# Patient Record
Sex: Female | Born: 1975 | Race: White | Hispanic: No | Smoking: Current every day smoker
Health system: Southern US, Community
[De-identification: ages and names within clinical notes are randomized; demographics above are authoritative.]

## PROBLEM LIST (undated history)

## (undated) DIAGNOSIS — J45909 Unspecified asthma, uncomplicated: Secondary | ICD-10-CM

## (undated) DIAGNOSIS — F419 Anxiety disorder, unspecified: Secondary | ICD-10-CM

## (undated) DIAGNOSIS — I10 Essential (primary) hypertension: Secondary | ICD-10-CM

## (undated) DIAGNOSIS — K8020 Calculus of gallbladder without cholecystitis without obstruction: Secondary | ICD-10-CM

## (undated) DIAGNOSIS — E669 Obesity, unspecified: Secondary | ICD-10-CM

## (undated) DIAGNOSIS — F319 Bipolar disorder, unspecified: Secondary | ICD-10-CM

## (undated) DIAGNOSIS — T7840XA Allergy, unspecified, initial encounter: Secondary | ICD-10-CM

## (undated) DIAGNOSIS — E079 Disorder of thyroid, unspecified: Secondary | ICD-10-CM

## (undated) DIAGNOSIS — E119 Type 2 diabetes mellitus without complications: Secondary | ICD-10-CM

## (undated) DIAGNOSIS — L409 Psoriasis, unspecified: Secondary | ICD-10-CM

## (undated) DIAGNOSIS — K219 Gastro-esophageal reflux disease without esophagitis: Secondary | ICD-10-CM

## (undated) DIAGNOSIS — E7800 Pure hypercholesterolemia, unspecified: Secondary | ICD-10-CM

## (undated) DIAGNOSIS — Z8659 Personal history of other mental and behavioral disorders: Secondary | ICD-10-CM

## (undated) DIAGNOSIS — J302 Other seasonal allergic rhinitis: Secondary | ICD-10-CM

## (undated) HISTORY — DX: Personal history of other mental and behavioral disorders: Z86.59

## (undated) HISTORY — DX: Type 2 diabetes mellitus without complications: E11.9

## (undated) HISTORY — DX: Allergy, unspecified, initial encounter: T78.40XA

## (undated) HISTORY — DX: Bipolar disorder, unspecified: F31.9

## (undated) HISTORY — DX: Calculus of gallbladder without cholecystitis without obstruction: K80.20

## (undated) HISTORY — PX: TUBAL LIGATION: SHX77

## (undated) HISTORY — DX: Other seasonal allergic rhinitis: J30.2

## (undated) HISTORY — DX: Gastro-esophageal reflux disease without esophagitis: K21.9

## (undated) HISTORY — DX: Disorder of thyroid, unspecified: E07.9

## (undated) HISTORY — DX: Obesity, unspecified: E66.9

## (undated) HISTORY — PX: TONSILLECTOMY: SUR1361

## (undated) HISTORY — DX: Unspecified asthma, uncomplicated: J45.909

## (undated) HISTORY — DX: Anxiety disorder, unspecified: F41.9

## (undated) HISTORY — DX: Pure hypercholesterolemia, unspecified: E78.00

---

## 2000-03-31 ENCOUNTER — Other Ambulatory Visit: Admission: RE | Admit: 2000-03-31 | Discharge: 2000-03-31 | Payer: Self-pay | Admitting: Internal Medicine

## 2001-04-03 ENCOUNTER — Other Ambulatory Visit: Admission: RE | Admit: 2001-04-03 | Discharge: 2001-04-03 | Payer: Self-pay | Admitting: Internal Medicine

## 2004-07-27 ENCOUNTER — Ambulatory Visit (HOSPITAL_BASED_OUTPATIENT_CLINIC_OR_DEPARTMENT_OTHER): Admission: RE | Admit: 2004-07-27 | Discharge: 2004-07-27 | Payer: Self-pay | Admitting: Internal Medicine

## 2004-08-02 ENCOUNTER — Ambulatory Visit: Payer: Self-pay | Admitting: Internal Medicine

## 2005-04-20 ENCOUNTER — Emergency Department (HOSPITAL_COMMUNITY): Admission: EM | Admit: 2005-04-20 | Discharge: 2005-04-21 | Payer: Self-pay | Admitting: Emergency Medicine

## 2005-05-07 ENCOUNTER — Emergency Department (HOSPITAL_COMMUNITY): Admission: EM | Admit: 2005-05-07 | Discharge: 2005-05-07 | Payer: Self-pay | Admitting: Emergency Medicine

## 2005-05-09 ENCOUNTER — Emergency Department (HOSPITAL_COMMUNITY): Admission: AD | Admit: 2005-05-09 | Discharge: 2005-05-09 | Payer: Self-pay | Admitting: Family Medicine

## 2005-05-12 ENCOUNTER — Emergency Department (HOSPITAL_COMMUNITY): Admission: EM | Admit: 2005-05-12 | Discharge: 2005-05-12 | Payer: Self-pay | Admitting: Emergency Medicine

## 2005-05-13 ENCOUNTER — Emergency Department (HOSPITAL_COMMUNITY): Admission: EM | Admit: 2005-05-13 | Discharge: 2005-05-13 | Payer: Self-pay | Admitting: Emergency Medicine

## 2005-05-15 ENCOUNTER — Emergency Department (HOSPITAL_COMMUNITY): Admission: EM | Admit: 2005-05-15 | Discharge: 2005-05-15 | Payer: Self-pay | Admitting: *Deleted

## 2005-06-03 ENCOUNTER — Emergency Department (HOSPITAL_COMMUNITY): Admission: EM | Admit: 2005-06-03 | Discharge: 2005-06-03 | Payer: Self-pay | Admitting: Emergency Medicine

## 2005-06-06 ENCOUNTER — Emergency Department (HOSPITAL_COMMUNITY): Admission: EM | Admit: 2005-06-06 | Discharge: 2005-06-07 | Payer: Self-pay | Admitting: Emergency Medicine

## 2011-07-31 ENCOUNTER — Encounter (HOSPITAL_COMMUNITY): Payer: Self-pay | Admitting: Emergency Medicine

## 2011-07-31 ENCOUNTER — Emergency Department (HOSPITAL_COMMUNITY)
Admission: EM | Admit: 2011-07-31 | Discharge: 2011-07-31 | Disposition: A | Discharge status: Home / Self Care | Payer: Self-pay | Attending: Emergency Medicine | Admitting: Emergency Medicine

## 2011-07-31 DIAGNOSIS — G8929 Other chronic pain: Secondary | ICD-10-CM | POA: Insufficient documentation

## 2011-07-31 DIAGNOSIS — R079 Chest pain, unspecified: Secondary | ICD-10-CM | POA: Insufficient documentation

## 2011-07-31 DIAGNOSIS — F17200 Nicotine dependence, unspecified, uncomplicated: Secondary | ICD-10-CM | POA: Insufficient documentation

## 2011-07-31 DIAGNOSIS — I10 Essential (primary) hypertension: Secondary | ICD-10-CM | POA: Insufficient documentation

## 2011-07-31 DIAGNOSIS — N898 Other specified noninflammatory disorders of vagina: Secondary | ICD-10-CM | POA: Insufficient documentation

## 2011-07-31 DIAGNOSIS — E119 Type 2 diabetes mellitus without complications: Secondary | ICD-10-CM | POA: Insufficient documentation

## 2011-07-31 DIAGNOSIS — M549 Dorsalgia, unspecified: Secondary | ICD-10-CM | POA: Insufficient documentation

## 2011-07-31 HISTORY — DX: Essential (primary) hypertension: I10

## 2011-07-31 HISTORY — DX: Psoriasis, unspecified: L40.9

## 2011-07-31 LAB — URINALYSIS, ROUTINE W REFLEX MICROSCOPIC
Glucose, UA: 100 mg/dL — AB
Ketones, ur: 15 mg/dL — AB
Nitrite: NEGATIVE
Protein, ur: 30 mg/dL — AB
Specific Gravity, Urine: 1.024 (ref 1.005–1.030)
Urobilinogen, UA: 1 mg/dL (ref 0.0–1.0)
pH: 6 (ref 5.0–8.0)

## 2011-07-31 LAB — POCT I-STAT, CHEM 8
BUN: 3 mg/dL — ABNORMAL LOW (ref 6–23)
Calcium, Ion: 1.19 mmol/L (ref 1.12–1.32)
Chloride: 106 mEq/L (ref 96–112)
Creatinine, Ser: 0.5 mg/dL (ref 0.50–1.10)
Glucose, Bld: 231 mg/dL — ABNORMAL HIGH (ref 70–99)
HCT: 45 % (ref 36.0–46.0)
Hemoglobin: 15.3 g/dL — ABNORMAL HIGH (ref 12.0–15.0)
Potassium: 3.6 mEq/L (ref 3.5–5.1)
Sodium: 142 mEq/L (ref 135–145)
TCO2: 23 mmol/L (ref 0–100)

## 2011-07-31 LAB — WET PREP, GENITAL
Clue Cells Wet Prep HPF POC: NONE SEEN
Trich, Wet Prep: NONE SEEN

## 2011-07-31 LAB — URINE MICROSCOPIC-ADD ON

## 2011-07-31 LAB — GLUCOSE, CAPILLARY: Glucose-Capillary: 239 mg/dL — ABNORMAL HIGH (ref 70–99)

## 2011-07-31 LAB — POCT PREGNANCY, URINE: Preg Test, Ur: NEGATIVE

## 2011-07-31 MED ORDER — LIDOCAINE HCL (PF) 1 % IJ SOLN
INTRAMUSCULAR | Status: AC
  Administered 2011-07-31: 2 mL
  Filled 2011-07-31: qty 5

## 2011-07-31 MED ORDER — AZITHROMYCIN 250 MG PO TABS
1000.0000 mg | ORAL_TABLET | Freq: Once | ORAL | Status: AC
  Administered 2011-07-31: 1000 mg via ORAL
  Filled 2011-07-31: qty 4

## 2011-07-31 MED ORDER — CEFTRIAXONE SODIUM 250 MG IJ SOLR
250.0000 mg | Freq: Once | INTRAMUSCULAR | Status: AC
  Administered 2011-07-31: 250 mg via INTRAMUSCULAR
  Filled 2011-07-31: qty 250

## 2011-07-31 MED ORDER — FLUCONAZOLE 50 MG PO TABS
150.0000 mg | ORAL_TABLET | Freq: Once | ORAL | Status: AC
  Administered 2011-07-31: 150 mg via ORAL
  Filled 2011-07-31: qty 3

## 2011-07-31 NOTE — ED Notes (Signed)
Patient given discharge paperwork; went over discharge instructions with patient.  Patient instructed to follow up with primary care physician/referral, and to return to the ED for new, worsening, or concerning symptoms.

## 2011-07-31 NOTE — ED Notes (Signed)
Patient taken to CDU 11 for pelvic exam.  PA and RN at bedside with pelvic cart.

## 2011-07-31 NOTE — Discharge Instructions (Signed)
You were seen and evaluated for your complaints of vaginal discharge. You're treated for possible infection. It is recommended that he not have any sexual intercourse until you have results from your testing in 2 days confirmed. If you have any positive test results please inform all of your sexual partners. Is also recommended that he followup with [facility] health Department sexual transmitted disease clinic for continued evaluation and treatment. Your urine test today was also sent for cultures to look for infection. If you develop any pain with urination, increased frequency or flank pains please return to your primary care office for further evaluation. You should return to emergency room for any fever, chills, sweats, persistent nausea vomiting.   Chronic Back Pain When back pain lasts longer than 3 months, it is called chronic back pain.This pain can be frustrating, but the cause of the pain is rarely dangerous.People with chronic back pain often go through certain periods that are more intense (flare-ups). CAUSES Chronic back pain can be caused by wear and tear (degeneration) on different structures in your back. These structures may include bones, ligaments, or discs. This degeneration may result in more pressure being placed on the nerves that travel to your legs and feet. This can lead to pain traveling from the low back down the back of the legs. When pain lasts longer than 3 months, it is not unusual for people to experience anxiety or depression. Anxiety and depression can also contribute to low back pain. TREATMENT  Establish a regular exercise plan. This is critical to improving your functional level.   Have a self-management plan for when you flare-up. Flare-ups rarely require a medical visit. Regular exercise will help reduce the intensity and frequency of your flare-ups.   Manage how you feel about your back pain and the rest of your life. Anxiety, depression, and feeling that  you cannot alter your back pain have been shown to make back pain more intense and debilitating.   Medicines should never be your only treatment. They should be used along with other treatments to help you return to a more active lifestyle.   Procedures such as injections or surgery may be helpful but are rarely necessary. You may be able to get the same results with physical therapy or chiropractic care.  HOME CARE INSTRUCTIONS  Avoid bending, heavy lifting, prolonged sitting, and activities which make the problem worse.   Continue normal activity as much as possible.   Take brief periods of rest throughout the day to reduce your pain during flare-ups.   Follow your back exercise rehabilitation program. This can help reduce symptoms and prevent more pain.   Only take over-the-counter or prescription medicines as directed by your caregiver. Muscle relaxants are sometimes prescribed. Narcotic pain medicine is discouraged for long-term pain, since addiction is a possible outcome.   If you smoke, quit.   Eat healthy foods and maintain a recommended body weight.  SEEK IMMEDIATE MEDICAL CARE IF:   You have weakness or numbness in one of your legs or feet.   You have trouble controlling your bladder or bowels.   You develop nausea, vomiting, abdominal pain, shortness of breath, or fainting.  Document Released: 04/01/2004 Document Revised: 02/11/2011 Document Reviewed: 02/06/2011 [facility] Patient Information 2012 [place], [place].     Sexually Transmitted Disease Sexually transmitted disease (STD) refers to any infection that is passed from person to person during sexual activity. This may happen by way of saliva, semen, blood, vaginal mucus, or urine. Common  STDs include:  Gonorrhea.   Chlamydia.   Syphilis.   HIV/AIDS.   Genital herpes.   Hepatitis B and C.   Trichomonas.   Human papillomavirus (HPV).   Pubic lice.  CAUSES  An STD may be spread by bacteria, virus, or  parasite. A person can get an STD by:  Sexual intercourse with an infected person.   Sharing sex toys with an infected person.   Sharing needles with an infected person.   Having intimate contact with the genitals, mouth, or rectal areas of an infected person.  SYMPTOMS  Some people may not have any symptoms, but they can still pass the infection to others. Different STDs have different symptoms. Symptoms include:  Painful or bloody urination.   Pain in the pelvis, abdomen, vagina, anus, throat, or eyes.   Skin rash, itching, irritation, growths, or sores (lesions). These usually occur in the genital or anal area.   Abnormal vaginal discharge.   Penile discharge in men.   Soft, flesh-colored skin growths in the genital or anal area.   Fever.   Pain or bleeding during sexual intercourse.   Swollen glands in the groin area.   Yellow skin and eyes (jaundice). This is seen with hepatitis.  DIAGNOSIS  To make a diagnosis, your caregiver may:  Take a medical history.   Perform a physical exam.   Take a specimen (culture) to be examined.   Examine a sample of discharge under a microscope.   Perform blood tests.   Perform a Pap test, if this applies.   Perform a colposcopy.   Perform a laparoscopy.  TREATMENT   Chlamydia, gonorrhea, trichomonas, and syphilis can be cured with antibiotic medicine.   Genital herpes, hepatitis, and HIV can be treated, but not cured, with prescribed medicines. The medicines will lessen the symptoms.   Genital warts from HPV can be treated with medicine or by freezing, burning (electrocautery), or surgery. Warts may come back.   HPV is a virus and cannot be cured with medicine or surgery.However, abnormal areas may be followed very closely by your caregiver and may be removed from the cervix, vagina, or vulva through office procedures or surgery.  If your diagnosis is confirmed, your recent sexual partners need treatment. This is true  even if they are symptom-free or have a negative culture or evaluation. They should not have sex until their caregiver says it is okay. HOME CARE INSTRUCTIONS  All sexual partners should be informed, tested, and treated for all STDs.   Take your antibiotics as directed. Finish them even if you start to feel better.   Only take over-the-counter or prescription medicines for pain, discomfort, or fever as directed by your caregiver.   Rest.   Eat a balanced diet and drink enough fluids to keep your urine clear or pale yellow.   Do not have sex until treatment is completed and you have followed up with your caregiver. STDs should be checked after treatment.   Keep all follow-up appointments, Pap tests, and blood tests as directed by your caregiver.   Only use latex condoms and water-soluble lubricants during sexual activity. Do not use petroleum jelly or oils.   Avoid alcohol and illegal drugs.   Get vaccinated for HPV and hepatitis. If you have not received these vaccines in the past, talk to your caregiver about whether one or both might be right for you.   Avoid risky sex practices that can break the skin.  The only way to avoid  getting an STD is to avoid all sexual activity.Latex condoms and dental dams (for oral sex) will help lessen the risk of getting an STD, but will not completely eliminate the risk. SEEK MEDICAL CARE IF:   You have a fever.   You have any new or worsening symptoms.  Document Released: 05/15/2002 Document Revised: 02/11/2011 Document Reviewed: 05/22/2010 [facility] Patient Information 2012 [place], [place].    RESOURCE GUIDE  Dental Problems  Patients with Medicaid: [facility] [phone] [address]                                           [phone] [address] Phone:  [phone]                                                  Phone:  [phone]  If unable to pay or uninsured, contact:  [facility] or  [facility]. to become qualified for the adult dental clinic.  Chronic Pain Problems Contact [facility]  [phone] Patients need to be referred by their primary care doctor.  Insufficient Money for Medicine Contact United Way:  call "211" or [facility] 416-810-8250.  No Primary Care Doctor Call [facility]  [phone] Other agencies that provide inexpensive medical care    [facility]  [phone]    [facility] Internal Medicine  [phone]    [facility]  [phone]    [facility]  [phone]    [facility]  [phone]    [facility]  [phone]  Psychological Services [facility] Behavioral Health  [phone] [facility]  [phone] [facility] Mental Health   [phone] (emergency services ([phone])  Substance Abuse Resources [facility]  [phone])652-2451 [facility] [phone] [facility] [phone] Floydene Flock [phone] Residential & Outpatient Substance Abuse Program  [phone]  Abuse/Neglect [facility] Child Abuse Hotline 432-719-7388 [facility] Child Abuse Hotline [phone] (After Hours)  Emergency Shelter [facility] [phone]  M[facility] [place] [facility] [phone] [facility] [phone]  MRSA Hotline #:   (920)636-5439    [facility] Resources  [facility]     United [employer]                          [facility] Dept. [address] [place]                       [address]      [address]  Patrecia Pace  [facility] Phone:  [phone]                                   Phone:  [phone]                 Phone:  [phone]  [facility] Phone:  [phone]  [facility] Child Abuse Hotline [phone])676-2349 [phone] (After Hours)

## 2011-07-31 NOTE — ED Provider Notes (Signed)
History     CSN: [id number]  Arrival date & time 07/31/11  2105   First MD Initiated Contact with Patient 07/31/11 2124      Chief Complaint  Patient presents with  . Back Pain  . Chest Pain    HPI  History provided by the patient. Patient is a 36 year old female with history of hypertension, diabetes who presents with multiple complaints. Patient initially complained of chronic back and chest pains to the triage nurse. Patient states that she has had the same symptoms for more than 20 years as long as she can remember. Patient denies any significant change in the symptoms but states to me that she is just worn out today. She reports having increased stress related to her daughter's welfare. She states her daughter has been with her divorced husband in [place] and that her daughter has mental health issues and she is worried that she will be taken to mental Institute. Patient also states that she was recently in mental Institute in [place] and does not want her daughter to have to go through the same things. Patient denies having any hallucinations. She reports feeling slightly depressed but denies any SI/HI. Patient does request to have a Pap smear screening. Patient does report having some vaginal discharge complaints. Discharge is thick and white with some itching. She denies any abdominal pain, fever, chills, sweats. Patient denies any heart palpitations or shortness of breath. Chest pain is primarily on right lower lateral side it is bilateral. He denies any aggravating or alleviating factors. Patient did present for many years. Patient also has low back pains that are unchanged and worse with bending.   Past Medical History  Diagnosis Date  . Hypertension   . Diabetes mellitus   . Psoriasis     Past Surgical History  Procedure Date  . Tonsillectomy     History reviewed. No pertinent family history.  History  Substance Use Topics  . Smoking status: Current Everyday Smoker  -- 3.0 packs/day  . Smokeless tobacco: Not on file  . Alcohol Use: Yes    OB History    Grav Para Term Preterm Abortions TAB SAB Ect Mult Living                  Review of Systems  Constitutional: Negative for fever, chills and appetite change.  Respiratory: Negative for cough and shortness of breath.   Cardiovascular: Positive for chest pain. Negative for palpitations.  Gastrointestinal: Negative for nausea, vomiting, abdominal pain, diarrhea and constipation.  Genitourinary: Positive for vaginal discharge. Negative for dysuria, frequency, hematuria, flank pain and vaginal bleeding.  Musculoskeletal: Positive for back pain.  Skin: Negative for rash.  Psychiatric/Behavioral: Negative for suicidal ideas, hallucinations and self-injury.    Allergies  Review of patient's allergies indicates no known allergies.  Home Medications   Current Outpatient Rx  Name Route Sig Dispense Refill  . CETIRIZINE HCL 10 MG PO TABS Oral Take 10 mg by mouth daily.    Marland Kitchen GLIPIZIDE PO Oral Take 1 tablet by mouth 2 (two) times daily with a meal.    . HYDROXYZINE HCL 25 MG PO TABS Oral Take 25 mg by mouth every 8 (eight) hours as needed.    . IBUPROFEN 800 MG PO TABS Oral Take 800 mg by mouth every 8 (eight) hours as needed. For pain    . LITHIUM CARBONATE 300 MG PO CAPS Oral Take 300 mg by mouth 3 (three) times daily with meals.    Marland Kitchen  METFORMIN HCL 1000 MG PO TABS Oral Take 1,000 mg by mouth 2 (two) times daily with a meal.    . PRESCRIPTION MEDICATION Topical Apply 1 application topically daily. Apply to stomach and scalp for psoriasis    . PROPRANOLOL HCL PO Oral Take 1 tablet by mouth 2 (two) times daily.    Marland Kitchen RANITIDINE HCL 150 MG PO CAPS Oral Take 150 mg by mouth 2 (two) times daily.      BP 150/78  Pulse 96  Temp(Src) 98.2 F (36.8 C) (Oral)  Resp 17  Ht 5\' 7"  (1.702 m)  Wt 270 lb (122.471 kg)  BMI 42.29 kg/m2  SpO2 100%  Physical Exam  Nursing note and vitals reviewed. Constitutional:  She is oriented to person, place, and time. She appears well-developed and well-nourished. No distress.  HENT:  Head: Normocephalic and atraumatic.  Eyes: Conjunctivae and EOM are normal. Pupils are equal, round, and reactive to light.  Neck: Normal range of motion. Neck supple.  Cardiovascular: Normal rate and regular rhythm.   Pulmonary/Chest: Effort normal and breath sounds normal. No respiratory distress. She has no wheezes. She has no rales.  Abdominal: Soft. There is no tenderness.       Obese  Musculoskeletal:       Back:  Neurological: She is alert and oriented to person, place, and time.  Skin: Skin is warm and dry. No rash noted.  Psychiatric: Her behavior is normal. She exhibits a depressed mood. She expresses no homicidal and no suicidal ideation.       Flat affect    ED Course  Procedures   Results for orders placed during the hospital encounter of 07/31/11  URINALYSIS, ROUTINE W REFLEX MICROSCOPIC      Component Value Range   Color, Urine YELLOW  YELLOW    APPearance TURBID (*) CLEAR    Specific Gravity, Urine 1.024  1.005 - 1.030    pH 6.0  5.0 - 8.0    Glucose, UA 100 (*) NEGATIVE (mg/dL)   Hgb urine dipstick TRACE (*) NEGATIVE    Bilirubin Urine SMALL (*) NEGATIVE    Ketones, ur 15 (*) NEGATIVE (mg/dL)   Protein, ur 30 (*) NEGATIVE (mg/dL)   Urobilinogen, UA 1.0  0.0 - 1.0 (mg/dL)   Nitrite NEGATIVE  NEGATIVE    Leukocytes, UA SMALL (*) NEGATIVE   WET PREP, GENITAL      Component Value Range   Yeast Wet Prep HPF POC RARE (*) NONE SEEN    Trich, Wet Prep NONE SEEN  NONE SEEN    Clue Cells Wet Prep HPF POC NONE SEEN  NONE SEEN    WBC, Wet Prep HPF POC MODERATE (*) NONE SEEN   GLUCOSE, CAPILLARY      Component Value Range   Glucose-Capillary 239 (*) 70 - 99 (mg/dL)   Comment 1 Notify RN     Comment 2 Documented in Chart    POCT PREGNANCY, URINE      Component Value Range   Preg Test, Ur NEGATIVE  NEGATIVE   URINE MICROSCOPIC-ADD ON      Component Value  Range   Squamous Epithelial / LPF MANY (*) RARE    WBC, UA 3-6  <3 (WBC/hpf)   RBC / HPF 0-2  <3 (RBC/hpf)   Bacteria, UA MANY (*) RARE   POCT I-STAT, CHEM 8      Component Value Range   Sodium 142  135 - 145 (mEq/L)   Potassium 3.6  3.5 - 5.1 (  mEq/L)   Chloride 106  96 - 112 (mEq/L)   BUN 3 (*) 6 - 23 (mg/dL)   Creatinine, Ser 1.61  0.50 - 1.10 (mg/dL)   Glucose, Bld 096 (*) 70 - 99 (mg/dL)   Calcium, Ion 0.45  4.09 - 1.32 (mmol/L)   TCO2 23  0 - 100 (mmol/L)   Hemoglobin 15.3 (*) 12.0 - 15.0 (g/dL)   HCT 81.1  91.4 - 78.2 (%)        1. Vaginal Discharge   2. Chronic pain       MDM  Patient seen and evaluated. Patient no acute distress. Patient with multiple complaints. When asked how she can be helped today patient states that she would like a Pap smear. She does report having some vaginal discharge. Patient is sexually active. Patient denies any ulcerations, SI, HI. Patient is not requesting any help for evaluation from behavioral health. I did explain to the patient that we do not do Pap smear screening for cervical cancer however we can check for transmitted disease or infections. Patient does wish to be screened for STDs.         Angus Seller, [place] 08/01/11 [phone]

## 2011-07-31 NOTE — ED Notes (Addendum)
Lower back pain starting 20 years, got worse over last couple of days; also reports CP X 20 years, worse in last couple of days; SOB, nausea, reports fevers/chills in last 24 hours

## 2011-07-31 NOTE — ED Notes (Signed)
During triage pt would make remarks that were out of context of questions, pt recently stated that she left a mental institute one week ago and that's when she is trying to quit marijuana; pt reports drinking since then to cope with anxiety

## 2011-07-31 NOTE — ED Notes (Signed)
Patient discharged home; ambulatory to discharge desk. 

## 2011-07-31 NOTE — ED Notes (Signed)
Pelvic cart set up in CDU 11 to prepare for pelvic exam.

## 2011-07-31 NOTE — ED Notes (Signed)
Waiting on diflucan from pharmacy.

## 2011-07-31 NOTE — ED Notes (Signed)
CBG:[id number] 

## 2011-08-02 LAB — GC/CHLAMYDIA PROBE AMP, GENITAL
Chlamydia, DNA Probe: NEGATIVE
GC Probe Amp, Genital: NEGATIVE

## 2011-08-02 LAB — URINE CULTURE
Colony Count: 75000
Culture  Setup Time: 201305261123

## 2011-08-02 NOTE — ED Provider Notes (Signed)
Medical screening examination/treatment/procedure(s) were performed by non-physician practitioner and as supervising physician I was immediately available for consultation/collaboration.  Caiya Bettes R. Donzella Carrol, MD 08/02/11 0002 

## 2012-03-30 DIAGNOSIS — E559 Vitamin D deficiency, unspecified: Secondary | ICD-10-CM | POA: Insufficient documentation

## 2012-03-30 DIAGNOSIS — K219 Gastro-esophageal reflux disease without esophagitis: Secondary | ICD-10-CM | POA: Insufficient documentation

## 2012-03-30 DIAGNOSIS — J309 Allergic rhinitis, unspecified: Secondary | ICD-10-CM | POA: Insufficient documentation

## 2012-03-30 DIAGNOSIS — L409 Psoriasis, unspecified: Secondary | ICD-10-CM | POA: Insufficient documentation

## 2013-12-05 ENCOUNTER — Other Ambulatory Visit: Payer: Self-pay | Admitting: Family Medicine

## 2013-12-05 DIAGNOSIS — Z1231 Encounter for screening mammogram for malignant neoplasm of breast: Secondary | ICD-10-CM

## 2013-12-27 ENCOUNTER — Ambulatory Visit
Admission: RE | Admit: 2013-12-27 | Discharge: 2013-12-27 | Disposition: A | Discharge status: Home / Self Care | Payer: 59 | Source: Ambulatory Visit | Attending: Family Medicine | Admitting: Family Medicine

## 2013-12-27 DIAGNOSIS — Z1231 Encounter for screening mammogram for malignant neoplasm of breast: Secondary | ICD-10-CM

## 2014-03-28 ENCOUNTER — Ambulatory Visit (HOSPITAL_COMMUNITY): Payer: 59 | Admitting: Psychiatry

## 2014-04-30 ENCOUNTER — Ambulatory Visit (INDEPENDENT_AMBULATORY_CARE_PROVIDER_SITE_OTHER): Payer: 59 | Admitting: Psychiatry

## 2014-04-30 ENCOUNTER — Encounter (HOSPITAL_COMMUNITY): Payer: Self-pay | Admitting: Psychiatry

## 2014-04-30 VITALS — BP 151/85 | HR 98 | Ht 67.0 in | Wt 255.8 lb

## 2014-04-30 DIAGNOSIS — F603 Borderline personality disorder: Secondary | ICD-10-CM

## 2014-04-30 DIAGNOSIS — F1221 Cannabis dependence, in remission: Secondary | ICD-10-CM

## 2014-04-30 DIAGNOSIS — Z79899 Other long term (current) drug therapy: Secondary | ICD-10-CM

## 2014-04-30 DIAGNOSIS — F319 Bipolar disorder, unspecified: Secondary | ICD-10-CM

## 2014-04-30 MED ORDER — TRAZODONE HCL 150 MG PO TABS: 375.0000 mg | ORAL_TABLET | Freq: Every day | ORAL | Status: DC

## 2014-04-30 MED ORDER — LITHIUM CARBONATE 600 MG PO CAPS: 600.0000 mg | ORAL_CAPSULE | Freq: Two times a day (BID) | ORAL | Status: DC

## 2014-04-30 MED ORDER — BUSPIRONE HCL 30 MG PO TABS: 30.0000 mg | ORAL_TABLET | Freq: Two times a day (BID) | ORAL | Status: DC

## 2014-04-30 NOTE — Progress Notes (Signed)
Psychiatric Assessment Adult  Patient Identification:  Valerie Reilly Date of Evaluation:  04/30/2014 Chief Complaint: med refill History of Chief Complaint:   Chief Complaint  Patient presents with  . Bi polar    HPI Comments: Pt moved to [facility] in August 2015. She was being treated at [facility] in [place]. Pt was diagnosed with Bipolar disorder, Cannabis abuse and Borderline PD. Pt was treated with Lithium 600mg  BID, Buspar 30mg  BID and Trazodone 375mg  po qHS. Pt has been taking this combination for years and has stable.   Denies depression. Today denies depression, sad mood, isolation, anhedonia, crying spells, worthlessness, hopelessness, SI/HI. Denies manic and hypomanic symptoms including periods of decreased need for sleep, increased energy, mood lability, impulsivity, FOI, and excessive spending. Sleep, appetite, energy and concentration are good. Takes meds as prescribed and denies SE.   Review of Systems Physical Exam  Psychiatric: She has a normal mood and affect. Her speech is normal and behavior is normal. Judgment and thought content normal. Cognition and memory are normal.    Depressive Symptoms: last time depressed was over 2 yrs ago. before got her job. In the past when depressed she doesn't want to get out of bed, poor hygiene, low motivation and energy, anhedonia.    (Hypo) Manic Symptoms:  Reports last manic episode was in 2014 and she was hospitalized at [facility]. Reports hx of poor sleep, increased energy, elevated mood with mood lability. Pt will stop taking her meds and can get violent with others.  Elevated Mood:  Yes Irritable Mood:  No Grandiosity:  Yes Distractibility:  Yes Labiality of Mood:  Yes Delusions:  Yes Hallucinations:  Yes Impulsivity:  Yes Sexually Inappropriate Behavior:  No Financial Extravagance:  No Flight of Ideas:  Yes  Anxiety Symptoms: Excessive Worry:  No worries for a few hours when not at work. States it is normal and not  excessive.  Panic Symptoms:  No Agoraphobia:  No Obsessive Compulsive: No  Symptoms: None, Specific Phobias:  No Social Anxiety:  No  Psychotic Symptoms:  Hallucinations: No Auditory Visual in the past when manic. Only happen when manic- can't recall details Delusions:  No Paranoia:  No   Ideas of Reference:  No  PTSD Symptoms: Ever had a traumatic exposure:  No Had a traumatic exposure in the last month:  No Re-experiencing: No None Hypervigilance:  No Hyperarousal: No None Avoidance: No None  Traumatic Brain Injury: No  Past Psychiatric History: Diagnosis: Bipolar, Cannabis abuse, Borderline PD  Hospitalizations: multiple- last time was in 2014 at [facility] for mania  Outpatient Care: [facility] in [place], can't recall other psychiatrists. Has been in group and individual therapy in the past  Substance Abuse Care: drug class in 2014 for [facility]  Self-Mutilation: denies  Suicidal Attempts: yes- many years ago. Has hx of 2-3 previous attempts  Violent Behaviors: denies but later stated maybe when psychotic or manic?   Past Medical History:   Past Medical History  Diagnosis Date  . Hypertension   . Diabetes mellitus   . Psoriasis   . Bipolar disorder   . History of borderline personality disorder   . Thyroid disease   . Diabetes mellitus, type II   . GERD (gastroesophageal reflux disease)   . Hypercholesteremia   . Seasonal allergies    History of Loss of Consciousness:  No Seizure History:  No Cardiac History:  No Allergies:  No Known Allergies Current Medications:  Current Outpatient Prescriptions  Medication Sig Dispense Refill  . albuterol (PROVENTIL  HFA;VENTOLIN HFA) 108 (90 BASE) MCG/ACT inhaler Inhale into the lungs every 6 (six) hours as needed for wheezing or shortness of breath.    Marland Kitchen aspirin 81 MG tablet Take 81 mg by mouth daily.    . benazepril (LOTENSIN) 20 MG tablet Take 20 mg by mouth daily.    . busPIRone (BUSPAR) 10 MG tablet Take 30 mg by mouth 2  (two) times daily.    . cetirizine (ZYRTEC) 10 MG tablet Take 10 mg by mouth daily.    . cholecalciferol (VITAMIN D) 1000 UNITS tablet Take 1,000 Units by mouth daily.    . Clobetasol Propionate (TEMOVATE) 0.05 % external spray Apply topically as directed.    Marland Kitchen GLIPIZIDE PO Take 1 tablet by mouth 2 (two) times daily with a meal.    . ibuprofen (ADVIL,MOTRIN) 800 MG tablet Take 800 mg by mouth every 8 (eight) hours as needed. For pain    . levothyroxine (SYNTHROID, LEVOTHROID) 50 MCG tablet Take 50 mcg by mouth daily before breakfast.    . lithium carbonate 300 MG capsule Take 600 mg by mouth 2 (two) times daily with a meal.     . medroxyPROGESTERone (DEPO-PROVERA) 150 MG/ML injection Inject 150 mg into the muscle every 3 (three) months.    . meloxicam (MOBIC) 15 MG tablet Take 15 mg by mouth daily.    . metFORMIN (GLUCOPHAGE) 1000 MG tablet Take 1,000 mg by mouth 2 (two) times daily with a meal.    . Multiple Vitamin (MULTIVITAMIN) capsule Take 1 capsule by mouth daily.    . Omega-3 Fatty Acids (FISH OIL) 1000 MG CAPS Take by mouth.    . ranitidine (ZANTAC) 150 MG capsule Take 150 mg by mouth 2 (two) times daily.    . simvastatin (ZOCOR) 20 MG tablet Take 20 mg by mouth daily.    . traZODone (DESYREL) 150 MG tablet Take by mouth. 2 and 1/2 tablets    . triamcinolone cream (KENALOG) 0.1 % Apply 1 application topically 2 (two) times daily.     No current facility-administered medications for this visit.    Previous Psychotropic Medications:  Medication Dose   can't recall other meds                      Substance Abuse History in the last 12 months: Substance Age of 1st Use Last Use Amount Specific Type  Nicotine    today 1-2 ppd cigs  Alcohol    1 month ago A few times a year, 6-7 drinks per episode brandy  Cannabis    last time was 2014. Was in a drug class after treatment at [facility]  several times day Smoking THC  Opiates  denies     Cocaine  denies     Methamphetamines  denies      LSD  denies     Ecstasy  denies     Benzodiazepines  denies     Caffeine    today Several drinks a day Tea, coffee, soda  Inhalants  denies     Others:  denies                         Medical Consequences of Substance Abuse: denies  Legal Consequences of Substance Abuse: possession charges in [place] of Substance Abuse: denies  Blackouts:  No DT's:  No Withdrawal Symptoms:  No None  Social History: Current Place of Residence: [place] with boyfriend and  daughter Place of Birth: born in [place], raised from 2nd grade up to [place]. "ok" childhood Family Members: raised by parents. 1 brother. Marital Status:  Divorced married for 2 yrs to father of her daughter. Current boyfriend together for 15 yrs Children: 1  Sons: 0  Daughters: 22yo Relationships: support from boyfriend Education:  tech- Research scientist (life sciences) Problems/Performance: denies. 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 Legal History: misdemeanor 7-8 yrs ago when sick  Hobbies/Interests: denies  Family History:   Family History  Problem Relation Age of Onset  . Personality disorder Mother   . Alcohol abuse Maternal Uncle   . Depression Maternal Uncle     Mental Status Examination/Evaluation: Objective: Attitude: Calm and cooperative  Appearance: Casual, appears to be stated age  Eye Contact::  Good  Speech:  Clear and Coherent and Normal Rate  Volume:  Normal  Mood:  euthymic  Affect:  Blunt  Thought Process:  Goal Directed, Linear and Logical  Orientation:  Full (Time, Place, and Person)  Thought Content:  Negative  Suicidal Thoughts:  No  Homicidal Thoughts:  No  Judgement:  Good  Insight:  Good  Concentration: good  Memory: Immediate-intact Recent-intact Remote-intact  Recall: fair  Language: fair  Gait and  Station: normal  [employer] of Knowledge: average  Psychomotor Activity:  Normal  Akathisia:  No  Handed:  Right  AIMS (if indicated):  Facial and Oral Movements  Muscles of Facial Expression: None, normal  Lips and Perioral Area: None, normal  Jaw: None, normal  Tongue: None, normal Extremity Movements: Upper (arms, wrists, hands, fingers): None, normal  Lower (legs, knees, ankles, toes): None, normal,  Trunk Movements:  Neck, shoulders, hips: None, normal,  Overall Severity : Severity of abnormal movements (highest score from questions above): None, normal  Incapacitation due to abnormal movements: None, normal  Patient's awareness of abnormal movements (rate only patient's report): No Awareness, Dental Status  Current problems with teeth and/or dentures?: No  Does patient usually wear dentures?: No    Assets:  Communication Skills Desire for [place] Talents/Skills Transportation        Laboratory/X-Ray Psychological Evaluation(s)   care everywhere- 12/05/2013 creat 125.6, HbA1c WNL, HDL low, TSH WNL, Lithium 1,   denies   Assessment:    AXIS I Bipolar I disorder- MSE unspecified; Cannabis abuse- in remission  AXIS II Borderline Personality Dis.  AXIS III Past Medical History  Diagnosis Date  . Hypertension   . Diabetes mellitus   . Psoriasis   . Bipolar disorder   . History of borderline personality disorder   . Thyroid disease   . Diabetes mellitus, type II   . GERD (gastroesophageal reflux disease)   . Hypercholesteremia   . Seasonal allergies      AXIS IV limited coping skills  AXIS V 51-60 moderate symptoms   Treatment Plan/Recommendations:  Plan of Care:  Medication management with supportive therapy. Risks/benefits and SE of the medication discussed. Pt verbalized understanding and verbal consent obtained for treatment.  Affirm with the patient that the medications are taken as ordered. Patient expressed  understanding of how their medications were to be used.   Confidentiality and exclusions reviewed with pt who verbalized understanding.  - reviewed records from previous provider and will scan copy into chart  Laboratory: ordered Lithium level, CMP with Bun/Creatinine, TSH   Psychotherapy:  Therapy: brief supportive therapy provided. Discussed psychosocial stressors in detail.     Medications: Continue Lithium 600mg  po BID for mood stabalization Buspar 30mg  po BID for adjunct mood stabalization Trazodone 375mg  po qHS for sleep  Routine PRN Medications:  No  Consultations: declined therapy  Safety Concerns:  Pt denies SI and is at an acute low risk for suicide. Pt's chronic risk of suicide is moderate due to hx of previous suicide attempts and noncompliance with meds.  Patient told to call clinic if any problems occur. Patient advised to go to ER if they should develop SI/HI, side effects, or if symptoms worsen. Has crisis numbers to call if needed. Pt verbalized understanding.   Other:  F/up in 3 months or sooner if needed     Charlcie Cradle, MD 2/23/201610:24 AM

## 2014-05-06 ENCOUNTER — Telehealth (HOSPITAL_COMMUNITY): Payer: Self-pay | Admitting: *Deleted

## 2014-05-06 DIAGNOSIS — F319 Bipolar disorder, unspecified: Secondary | ICD-10-CM

## 2014-05-06 NOTE — Telephone Encounter (Signed)
Dr. Doyne Keel,  Another fax sent from [facility]. Request for Lithium 600 mg to be changed. Request to be changed to 300 mg twice daily. Do you want to change?  Thank you

## 2014-05-06 NOTE — Telephone Encounter (Signed)
Dr. Doyne Keel,  Received fax from [facility].  Patient request for cost saving that her Buspar be sent in as 10 mg.  Patient request to take Buspar 10 mg---three tablets by mouth twice daily.  Do you want to change?  Thank you

## 2014-05-07 MED ORDER — LITHIUM CARBONATE 300 MG PO TABS: ORAL_TABLET | ORAL | Status: DC

## 2014-05-07 NOTE — Addendum Note (Signed)
Addended by: Hope Pigeon A on: 05/07/2014 12:27 PM   Modules accepted: Orders, Medications

## 2014-05-07 NOTE — Telephone Encounter (Signed)
She is prescribed 600mg  BID. If it is cheaper she can take 300mg  2tabs po BID- 120 tabs

## 2014-05-21 ENCOUNTER — Telehealth (HOSPITAL_COMMUNITY): Payer: Self-pay

## 2014-05-21 ENCOUNTER — Other Ambulatory Visit (HOSPITAL_COMMUNITY): Payer: Self-pay

## 2014-05-21 DIAGNOSIS — F319 Bipolar disorder, unspecified: Secondary | ICD-10-CM

## 2014-05-21 MED ORDER — BUSPIRONE HCL 10 MG PO TABS: ORAL_TABLET | ORAL | Status: DC

## 2014-05-21 NOTE — Telephone Encounter (Signed)
Met with Dr. Doyne Keel to inform of 2 faxes received, one to change patient's Lithium now to $Remo'300mg'HLIMv$  capsules, 2 twice a day in place of tablets and the other to change her Buspirone to $RemoveBefo'10mg'kZHYEqsOwBJ$  3 tablets twice a day for cost savings per pharmacy notice.  Dr. Doyne Keel approved changes and called in change for Buspirone with one refill as patient recently picked up past order and made sure pharmacy was canceling out other order in its place.  As for the change to lithium capsules, only $RemoveBefore'600mg'lVtHSReMWFavc$  and $Re'150mg'pIm$  capsules currently in database as choices.  Patient recently requested to have $Remov'600mg'pXtBsJ$  capsules changed so informed Vaschel, pharmacist I would leave up to Dr.Agarwal  If she still wanted to change.

## 2014-05-21 NOTE — Telephone Encounter (Signed)
Met with Dr. Doyne Keel to discuss [facility] pharmacies faxes received requesting to change patient's Lithium to 329m capsules in place of tablets due to cost savings and to also change her Buspar to 141m three two times a day for cost savings.  Dr. AgDoyne Keelkay with changes and [place] [facility] [place].  Vishel, pharmacist reported patient had already picked up her Buspar but not her lithium.  Agreed would make the changes and send in per approval of Dr. AgDoyne Keel

## 2014-05-23 ENCOUNTER — Other Ambulatory Visit (HOSPITAL_COMMUNITY): Payer: Self-pay | Admitting: Psychiatry

## 2014-05-23 DIAGNOSIS — F319 Bipolar disorder, unspecified: Secondary | ICD-10-CM

## 2014-05-23 MED ORDER — LITHIUM CARBONATE 600 MG PO CAPS: 600.0000 mg | ORAL_CAPSULE | Freq: Two times a day (BID) | ORAL | Status: DC

## 2014-05-23 NOTE — Telephone Encounter (Signed)
lithium 600 MG capsule 60 capsule 1 05/23/2014       Sig - Route: Take 1 capsule (600 mg total) by mouth 2 (two) times daily with a meal. - Oral     E-Prescribing Status: Receipt confirmed by pharmacy (05/23/2014 3:11 PM EDT)     Medication sent.

## 2014-05-23 NOTE — Telephone Encounter (Signed)
I sent in an e-script for Lithium 600mg  caps po BID just now

## 2014-05-30 ENCOUNTER — Telehealth (HOSPITAL_COMMUNITY): Payer: Self-pay | Admitting: *Deleted

## 2014-05-30 NOTE — Telephone Encounter (Signed)
Patient left message on nurse's voice mail.  Patient was yelling and very rude on nurse's voice mail. Patient was upset because per patient Vladimir Faster has been contacting our office several times via fax about changing her Lithium to be cost effective to her and we did nothing. The situation was resolved on 05-23-2014 notes are in EPIC.   I called patient back, left message that situation was resolved and if she needs further assistants to please call us back.

## 2014-06-25 DIAGNOSIS — E039 Hypothyroidism, unspecified: Secondary | ICD-10-CM | POA: Insufficient documentation

## 2014-06-25 DIAGNOSIS — I10 Essential (primary) hypertension: Secondary | ICD-10-CM | POA: Insufficient documentation

## 2014-07-30 ENCOUNTER — Ambulatory Visit (HOSPITAL_COMMUNITY): Payer: Self-pay | Admitting: Psychiatry

## 2014-08-08 ENCOUNTER — Ambulatory Visit (HOSPITAL_COMMUNITY): Payer: Self-pay | Admitting: Psychiatry

## 2014-08-15 ENCOUNTER — Encounter (HOSPITAL_COMMUNITY): Payer: Self-pay | Admitting: Psychiatry

## 2014-08-15 ENCOUNTER — Ambulatory Visit (INDEPENDENT_AMBULATORY_CARE_PROVIDER_SITE_OTHER): Payer: 59 | Admitting: Psychiatry

## 2014-08-15 VITALS — BP 129/80 | HR 75 | Ht 66.5 in | Wt 261.2 lb

## 2014-08-15 DIAGNOSIS — F603 Borderline personality disorder: Secondary | ICD-10-CM | POA: Diagnosis not present

## 2014-08-15 DIAGNOSIS — F319 Bipolar disorder, unspecified: Secondary | ICD-10-CM

## 2014-08-15 DIAGNOSIS — F1211 Cannabis abuse, in remission: Secondary | ICD-10-CM | POA: Insufficient documentation

## 2014-08-15 DIAGNOSIS — F1210 Cannabis abuse, uncomplicated: Secondary | ICD-10-CM

## 2014-08-15 MED ORDER — BUSPIRONE HCL 10 MG PO TABS: ORAL_TABLET | ORAL | Status: DC

## 2014-08-15 MED ORDER — TRAZODONE HCL 150 MG PO TABS: 375.0000 mg | ORAL_TABLET | Freq: Every day | ORAL | Status: DC

## 2014-08-15 MED ORDER — LITHIUM CARBONATE 300 MG PO CAPS: 600.0000 mg | ORAL_CAPSULE | Freq: Two times a day (BID) | ORAL | Status: DC

## 2014-08-15 NOTE — Progress Notes (Signed)
[facility] [phone] Progress Note  Valerie Reilly [id number] 39 y.o.  08/15/2014 9:39 AM  Chief Complaint: everything is going fine right now   History of Present Illness: Overall things are stable and going well. Denies any concerns today.   Denies depression, sad mood, isolation, crying spells, anhedonia, worthlessness and hopelessness.   Sleep is good and she is getting about 6 hrs with Trazodone. Energy, appetite and concentration are good.  Denies manic and hypomanic symptoms including periods of decreased need for sleep, increased energy, mood lability, impulsivity, FOI, and excessive spending.  Denies anxiety.  Denies any illicit drug use.   Takes meds as prescribed and denies SE.   Suicidal Ideation: No Plan Formed: No Patient has means to carry out plan: No  Homicidal Ideation: No Plan Formed: No Patient has means to carry out plan: No  Review of Systems: Psychiatric: Agitation: No Hallucination: No Depressed Mood: No Insomnia: No Hypersomnia: No Altered Concentration: No Feels Worthless: No Grandiose Ideas: No Belief In Special Powers: No New/Increased Substance Abuse: No Compulsions: No  Neurologic: Headache: No Seizure: No Paresthesias: No   Review of Systems  Constitutional: Negative for fever, chills and weight loss.  HENT: Negative for congestion, ear pain and sore throat.   Eyes: Negative for blurred vision, double vision, pain and redness.  Respiratory: Negative for cough, sputum production and wheezing.   Cardiovascular: Negative for chest pain, palpitations and leg swelling.  Gastrointestinal: Negative for heartburn, nausea, vomiting and abdominal pain.  Musculoskeletal: Negative for back pain, joint pain and neck pain.  Skin: Negative for itching and rash.  Neurological: Negative for dizziness, sensory change, seizures, loss of consciousness and headaches.  Psychiatric/Behavioral: Negative for depression, suicidal ideas,  hallucinations, memory loss and substance abuse. The patient is not nervous/anxious and does not have insomnia.      Past Medical Family, Social History:  reports that she has been smoking Cigarettes.  She has been smoking about 2.00 packs per day. She has never used smokeless tobacco. She reports that she drinks alcohol. She reports that she uses illicit drugs (Marijuana). Current Place of Residence: [place] with boyfriend and daughter Place of Birth: born in [place], raised from 2nd grade up to [place]. "ok" childhood Family Members: raised by parents. 1 brother. Marital Status: Divorced married for 2 yrs to father of her daughter. Current boyfriend together for 15 yrs Children: 1 Sons: 0 Daughters: 22yo Relationships: support from boyfriend Education: tech- Research scientist (life sciences) Problems/Performance: denies. 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 Legal History: misdemeanor 7-8 yrs ago when sick  Hobbies/Interests: denies  Outpatient Encounter Prescriptions as of 08/15/2014  Medication Sig  . albuterol (PROVENTIL HFA;VENTOLIN HFA) 108 (90 BASE) MCG/ACT inhaler Inhale into the lungs every 6 (six) hours as needed for wheezing or shortness of breath.  Marland Kitchen aspirin 81 MG tablet Take 81 mg by mouth daily.  . benazepril (LOTENSIN) 20 MG tablet Take 20 mg by mouth daily.  . busPIRone (BUSPAR) 10 MG tablet Three tablets twice a day  . cetirizine (ZYRTEC) 10 MG tablet Take 10 mg by mouth daily.  . cholecalciferol (VITAMIN D) 1000 UNITS tablet Take 1,000 Units by mouth daily.  . Clobetasol Propionate (TEMOVATE) 0.05 % external spray Apply topically as directed.  Marland Kitchen GLIPIZIDE PO Take 1 tablet by mouth 2 (two) times daily with a meal.  . ibuprofen (ADVIL,MOTRIN) 800 MG tablet Take  800 mg by mouth every 8 (eight) hours as  needed. For pain  . levothyroxine (SYNTHROID, LEVOTHROID) 50 MCG tablet Take 50 mcg by mouth daily before breakfast.  . lithium 600 MG capsule Take 1 capsule (600 mg total) by mouth 2 (two) times daily with a meal.  . medroxyPROGESTERone (DEPO-PROVERA) 150 MG/ML injection Inject 150 mg into the muscle every 3 (three) months.  . meloxicam (MOBIC) 15 MG tablet Take 15 mg by mouth daily.  . metFORMIN (GLUCOPHAGE) 1000 MG tablet Take 1,000 mg by mouth 2 (two) times daily with a meal.  . Multiple Vitamin (MULTIVITAMIN) capsule Take 1 capsule by mouth daily.  . Omega-3 Fatty Acids (FISH OIL) 1000 MG CAPS Take by mouth.  . ranitidine (ZANTAC) 150 MG capsule Take 150 mg by mouth 2 (two) times daily.  . simvastatin (ZOCOR) 20 MG tablet Take 20 mg by mouth daily.  . traZODone (DESYREL) 150 MG tablet Take 2.5 tablets (375 mg total) by mouth at bedtime. 2 and 1/2 tablets  . triamcinolone cream (KENALOG) 0.1 % Apply 1 application topically 2 (two) times daily.   No facility-administered encounter medications on file as of 08/15/2014.    Past Psychiatric History/Hospitalization(s): Anxiety: No Bipolar Disorder: Yes Depression: Yes Mania: Yes Psychosis: No Schizophrenia: No Personality Disorder: Yes Hospitalization for psychiatric illness: Yes History of Electroconvulsive Shock Therapy: No Prior Suicide Attempts: Yes  Past Psychiatric History: Diagnosis: Bipolar, Cannabis abuse, Borderline PD  Hospitalizations: multiple- last time was in 2014 at [facility] for mania  Outpatient Care: [facility] in [place], can't recall other psychiatrists. Has been in group and individual therapy in the past  Substance Abuse Care: drug class in 2014 for [facility]  Self-Mutilation: denies  Suicidal Attempts: yes- many years ago. Has hx of 2-3 previous attempts  Violent Behaviors: denies but later stated maybe when psychotic or manic?             Physical Exam: Constitutional:  BP 129/80 mmHg  Pulse 75  Ht 5'  6.5" (1.689 m)  Wt 261 lb 3.2 oz (118.48 kg)  BMI 41.53 kg/m2  General Appearance: alert, oriented, no acute distress  Musculoskeletal: Strength & Muscle Tone: within normal limits Gait & Station: normal Patient leans: straight. no leaning  Mental Status Examination/Evaluation: Objective: Attitude: Calm and cooperative  Appearance: Casual, appears to be stated age  Eye Contact::  Fair  Speech:  Clear and Coherent and Normal Rate  Volume:  Normal  Mood:  euthymic  Affect:  Blunt  Thought Process:  Goal Directed  Orientation:  Full (Time, Place, and Person)  Thought Content:  Negative  Suicidal Thoughts:  No  Homicidal Thoughts:  No  Judgement:  Fair  Insight:  Fair  Concentration: good  Memory: Immediate-fair Recent-fair Remote-fair  Recall: fair  Language: fair  Gait and Station: normal  [employer] of Knowledge: average  Psychomotor Activity:  Normal  Akathisia:  No  Handed:  Right  AIMS (if indicated):  n/a  Assets:  Communication Skills Desire for Improvement Intimacy Leisure Time Social Support Engineer, drilling (Choose Three): Established Problem, Stable/Improving (1), Review of Psycho-Social Stressors (1), Review or order clinical lab tests (1) and Review of Medication Regimen & Side Effects (2)  Assessment: AXIS I Bipolar I disorder- MSE unspecified; Cannabis abuse- in remission  AXIS II Borderline Personality Dis.   Treatment Plan/Recommendations:  Plan of Care: Medication management with supportive therapy. Risks/benefits and SE of the medication discussed. Pt verbalized  understanding and verbal consent obtained for treatment. Affirm with the patient that the medications are taken as ordered. Patient expressed understanding of how their medications were to be used.    Laboratory: reviewed labs from PCP on care everywhere 06/25/2014 Lithium level 1.1, CMP with Bun/Creatinine WNL, TSH WNL, Glu 114    Psychotherapy: Therapy: brief supportive therapy provided. Discussed psychosocial stressors in detail.    Medications: Continue Lithium 600mg  po BID for mood stabalization Buspar 30mg  po BID for adjunct mood stabalization Trazodone 375mg  po qHS for sleep  Routine PRN Medications: No  Consultations: declined therapy  Safety Concerns: Pt denies SI and is at an acute low risk for suicide. Pt's chronic risk of suicide is moderate due to hx of previous suicide attempts and noncompliance with meds. Patient told to call clinic if any problems occur. Patient advised to go to ER if they should develop SI/HI, side effects, or if symptoms worsen. Has crisis numbers to call if needed. Pt verbalized understanding.   Other: F/up in 5 months or sooner if needed           Charlcie Cradle, MD 08/15/2014

## 2015-01-23 ENCOUNTER — Ambulatory Visit (INDEPENDENT_AMBULATORY_CARE_PROVIDER_SITE_OTHER): Payer: 59 | Admitting: Psychiatry

## 2015-01-23 ENCOUNTER — Encounter (HOSPITAL_COMMUNITY): Payer: Self-pay | Admitting: Psychiatry

## 2015-01-23 VITALS — BP 128/78 | HR 89 | Ht 66.0 in | Wt 257.0 lb

## 2015-01-23 DIAGNOSIS — F319 Bipolar disorder, unspecified: Secondary | ICD-10-CM

## 2015-01-23 MED ORDER — BUSPIRONE HCL 10 MG PO TABS: ORAL_TABLET | ORAL | Status: DC

## 2015-01-23 MED ORDER — TRAZODONE HCL 150 MG PO TABS: 375.0000 mg | ORAL_TABLET | Freq: Every day | ORAL | Status: DC

## 2015-01-23 MED ORDER — LITHIUM CARBONATE 300 MG PO CAPS: 600.0000 mg | ORAL_CAPSULE | Freq: Two times a day (BID) | ORAL | Status: DC

## 2015-01-23 NOTE — Progress Notes (Signed)
Patient ID: Hipolito Bayley, female   DOB: September 17, 1975, 39 y.o.   MRN: [id number]  [facility] Progress Note  Calysta Lucking [id number] 39 y.o.  01/23/2015 9:19 AM  Chief Complaint: doing fine  History of Present Illness: Overall things are stable and going well. Denies any concerns today. States her only concern is finding a higher paying job.   Denies depression, sad mood, isolation, crying spells, anhedonia, worthlessness and hopelessness.   Sleep is good and she is getting about 6 hrs with Trazodone. Energy, appetite and concentration are good.  Denies manic and hypomanic symptoms including periods of decreased need for sleep, increased energy, mood lability, impulsivity, FOI, and excessive spending.  Denies anxiety. Denies racing thoughts. Denies panic attacks.   Denies any illicit drug use and has cut down on cig smoking to 1 ppd. She is not ready to quit yet.   Takes meds as prescribed and denies SE.   Suicidal Ideation: No Plan Formed: No Patient has means to carry out plan: No  Homicidal Ideation: No Plan Formed: No Patient has means to carry out plan: No  Review of Systems: Psychiatric: Agitation: No Hallucination: No Depressed Mood: No Insomnia: No Hypersomnia: No Altered Concentration: No Feels Worthless: No Grandiose Ideas: No Belief In Special Powers: No New/Increased Substance Abuse: No Compulsions: No  Neurologic: Headache: No Seizure: No Paresthesias: No   Review of Systems  Constitutional: Negative for fever, chills and weight loss.  HENT: Negative for congestion, ear pain and sore throat.   Eyes: Negative for blurred vision, double vision, pain and redness.  Respiratory: Negative for cough, sputum production and wheezing.   Cardiovascular: Negative for chest pain, palpitations and leg swelling.  Gastrointestinal: Negative for heartburn, nausea, vomiting and abdominal pain.  Musculoskeletal: Negative for back pain, joint pain  and neck pain.  Skin: Positive for itching and rash.  Neurological: Negative for dizziness, tremors, sensory change, seizures, loss of consciousness and headaches.  Psychiatric/Behavioral: Negative for depression, suicidal ideas, hallucinations, memory loss and substance abuse. The patient is not nervous/anxious and does not have insomnia.      Past Medical Family, Social History:  reports that she has been smoking Cigarettes and E-cigarettes.  She has been smoking about 1.00 pack per day. She has never used smokeless tobacco. She reports that she drinks alcohol. She reports that she does not use illicit drugs. Current Place of Residence: [place] with boyfriend and daughter Place of Birth: born in [place], raised from 2nd grade up to [place]. "ok" childhood Family Members: raised by parents. 1 brother. Marital Status: Divorced married for 2 yrs to father of her daughter. Current boyfriend together for 15 yrs Children: 1 Sons: 0 Daughters: 22yo Relationships: support from boyfriend Education: tech- Research scientist (life sciences) Problems/Performance: denies. 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 Legal History: misdemeanor 7-8 yrs ago when sick  Hobbies/Interests: denies  Outpatient Encounter Prescriptions as of 01/23/2015  Medication Sig  . aspirin 81 MG tablet Take 81 mg by mouth daily.  . benazepril (LOTENSIN) 20 MG tablet Take 20 mg by mouth daily.  . busPIRone (BUSPAR) 10 MG tablet Three tablets twice a day  . cetirizine (ZYRTEC) 10 MG tablet Take 10 mg by mouth daily.  . cholecalciferol (VITAMIN D) 1000 UNITS tablet Take 1,000 Units by mouth daily.  . Clobetasol Propionate (TEMOVATE) 0.05 % external spray Apply topically as directed.  Marland Kitchen  GLIPIZIDE PO Take 5 mg by mouth 2 (two) times daily with a meal.   .  levothyroxine (SYNTHROID, LEVOTHROID) 50 MCG tablet Take 50 mcg by mouth daily before breakfast.  . lithium 300 MG capsule Take 2 capsules (600 mg total) by mouth 2 (two) times daily with a meal.  . medroxyPROGESTERone (DEPO-PROVERA) 150 MG/ML injection Inject 150 mg into the muscle every 3 (three) months.  . meloxicam (MOBIC) 15 MG tablet Take 15 mg by mouth daily.  . metFORMIN (GLUCOPHAGE) 1000 MG tablet Take 1,000 mg by mouth 2 (two) times daily with a meal.  . Multiple Vitamin (MULTIVITAMIN) capsule Take 1 capsule by mouth daily.  . Omega-3 Fatty Acids (FISH OIL) 1000 MG CAPS Take by mouth.  . ranitidine (ZANTAC) 150 MG capsule Take 150 mg by mouth 2 (two) times daily.  . simvastatin (ZOCOR) 20 MG tablet Take 40 mg by mouth daily.   . traZODone (DESYREL) 150 MG tablet Take 2.5 tablets (375 mg total) by mouth at bedtime. 2 and 1/2 tablets  . triamcinolone cream (KENALOG) 0.1 % Apply 1 application topically 2 (two) times daily.  Marland Kitchen albuterol (PROVENTIL HFA;VENTOLIN HFA) 108 (90 BASE) MCG/ACT inhaler Inhale into the lungs every 6 (six) hours as needed for wheezing or shortness of breath.  Marland Kitchen ibuprofen (ADVIL,MOTRIN) 800 MG tablet Take 800 mg by mouth every 8 (eight) hours as needed. For pain   No facility-administered encounter medications on file as of 01/23/2015.    Past Psychiatric History/Hospitalization(s): Anxiety: No Bipolar Disorder: Yes Depression: Yes Mania: Yes Psychosis: No Schizophrenia: No Personality Disorder: Yes Hospitalization for psychiatric illness: Yes History of Electroconvulsive Shock Therapy: No Prior Suicide Attempts: Yes  Past Psychiatric History: Diagnosis: Bipolar, Cannabis abuse, Borderline PD  Hospitalizations: multiple- last time was in 2014 at [facility] for mania  Outpatient Care: [facility] in [place], can't recall other psychiatrists. Has been in group and individual therapy in the past  Substance Abuse Care: drug class in 2014 for [facility]   Self-Mutilation: denies  Suicidal Attempts: yes- many years ago. Has hx of 2-3 previous attempts  Violent Behaviors: denies but later stated maybe when psychotic or manic?             Physical Exam: Constitutional:  BP 128/78 mmHg  Pulse 89  Ht 5\' 6"  (1.676 m)  Wt 257 lb (116.574 kg)  BMI 41.50 kg/m2  General Appearance: alert, oriented, no acute distress  Musculoskeletal: Strength & Muscle Tone: within normal limits Gait & Station: normal Patient leans: straight. no leaning  Mental Status Examination/Evaluation: Objective: Attitude: Calm and cooperative  Appearance: Casual, appears to be stated age  Eye Contact::  Fair  Speech:  Clear and Coherent and Normal Rate  Volume:  Normal  Mood:  euthymic  Affect:  Blunt  Thought Process:  Goal Directed  Orientation:  Full (Time, Place, and Person)  Thought Content:  Negative  Suicidal Thoughts:  No  Homicidal Thoughts:  No  Judgement:  Fair  Insight:  Fair  Concentration: good  Memory: Immediate-fair Recent-fair Remote-fair  Recall: fair  Language: fair  Gait and Station: normal  [employer] of Knowledge: average  Psychomotor Activity:  Normal  Akathisia:  No  Handed:  Right  AIMS (if indicated):  n/a  Assets:  Communication Skills Desire for Improvement Intimacy Leisure Time Social Support Engineer, drilling (Choose Three): Established Problem, Stable/Improving (1), Review of Psycho-Social Stressors (1), Review or order clinical lab tests (1) and  Review of Medication Regimen & Side Effects (2)  Assessment: AXIS I Bipolar I disorder- MSE unspecified; Cannabis abuse- in remission  AXIS II Borderline Personality Dis.   Treatment Plan/Recommendations:  Plan of Care: Medication management with supportive therapy. Risks/benefits and SE of the medication discussed. Pt verbalized understanding and verbal consent obtained for treatment. Affirm with the  patient that the medications are taken as ordered. Patient expressed understanding of how their medications were to be used.    Laboratory: reviewed labs from PCP 01/08/2015 Lithium level 0.9, TSH WNL, Glu 153, Trig 179, creat 0.53, AST 33, HbA1c 8.3  Pt is working with her PCP regarding her health issues.    Psychotherapy: Therapy: brief supportive therapy provided. Discussed psychosocial stressors in detail.    Medications: Continue Lithium 600mg  po BID for mood stabalization Buspar 30mg  po BID for adjunct mood stabalization Trazodone 375mg  po qHS for sleep  Routine PRN Medications: No  Consultations: declined therapy  Safety Concerns: Pt denies SI and is at an acute low risk for suicide. Pt's chronic risk of suicide is moderate due to hx of previous suicide attempts and noncompliance with meds. Patient told to call clinic if any problems occur. Patient advised to go to ER if they should develop SI/HI, side effects, or if symptoms worsen. Has crisis numbers to call if needed. Pt verbalized understanding.   Other: F/up in 4 months or sooner if needed           Charlcie Cradle, MD 01/23/2015

## 2015-02-11 ENCOUNTER — Other Ambulatory Visit: Payer: Self-pay

## 2015-02-11 DIAGNOSIS — Z1231 Encounter for screening mammogram for malignant neoplasm of breast: Secondary | ICD-10-CM

## 2015-02-28 ENCOUNTER — Ambulatory Visit
Admission: RE | Admit: 2015-02-28 | Discharge: 2015-02-28 | Disposition: A | Discharge status: Home / Self Care | Payer: Managed Care, Other (non HMO) | Source: Ambulatory Visit

## 2015-02-28 DIAGNOSIS — Z1231 Encounter for screening mammogram for malignant neoplasm of breast: Secondary | ICD-10-CM

## 2015-05-29 ENCOUNTER — Encounter (HOSPITAL_COMMUNITY): Payer: Self-pay | Admitting: Psychiatry

## 2015-05-29 ENCOUNTER — Ambulatory Visit (INDEPENDENT_AMBULATORY_CARE_PROVIDER_SITE_OTHER): Payer: Managed Care, Other (non HMO) | Admitting: Psychiatry

## 2015-05-29 VITALS — BP 132/80 | HR 88 | Ht 66.5 in | Wt 250.8 lb

## 2015-05-29 DIAGNOSIS — F1221 Cannabis dependence, in remission: Secondary | ICD-10-CM | POA: Diagnosis not present

## 2015-05-29 DIAGNOSIS — F319 Bipolar disorder, unspecified: Secondary | ICD-10-CM

## 2015-05-29 MED ORDER — LITHIUM CARBONATE 300 MG PO CAPS: 600.0000 mg | ORAL_CAPSULE | Freq: Two times a day (BID) | ORAL | Status: DC

## 2015-05-29 MED ORDER — BUSPIRONE HCL 10 MG PO TABS: ORAL_TABLET | ORAL | Status: DC

## 2015-05-29 MED ORDER — TRAZODONE HCL 150 MG PO TABS: 375.0000 mg | ORAL_TABLET | Freq: Every day | ORAL | Status: DC

## 2015-05-29 NOTE — Progress Notes (Signed)
Patient ID: Valerie Reilly, female   DOB: March 24, 1975, 40 y.o.   MRN: [id number] Patient ID: Valerie Reilly, female   DOB: November 15, 1975, 40 y.o.   MRN: [id number]  [facility] [id number] Progress Note  Valerie Reilly [id number] 40 y.o.  05/29/2015 8:56 AM  Chief Complaint: its going  History of Present Illness: Overall things are stable and going well. Denies any concerns today. States her only concern is finding a higher paying job.   Denies depression, sad mood, isolation, crying spells, anhedonia, worthlessness and hopelessness.   Sleep is good and she is getting about 6 hrs with Trazodone. Energy, appetite and concentration are good.  Denies manic and hypomanic symptoms including periods of decreased need for sleep, increased energy, mood lability, impulsivity, FOI, and excessive spending.  Denies anxiety. Denies racing thoughts. Denies panic attacks.   Denies any illicit drug use and has cut down on cig smoking to 1 ppd. She is not ready to quit yet.   Takes meds as prescribed and denies SE.   Suicidal Ideation: No Plan Formed: No Patient has means to carry out plan: No  Homicidal Ideation: No Plan Formed: No Patient has means to carry out plan: No  Review of Systems: Psychiatric: Agitation: No Hallucination: No Depressed Mood: No Insomnia: No Hypersomnia: No Altered Concentration: No Feels Worthless: No Grandiose Ideas: No Belief In Special Powers: No New/Increased Substance Abuse: No Compulsions: No    Review of Systems  Constitutional: Negative for fever, chills and weight loss.  HENT: Negative for congestion, ear pain and sore throat.   Eyes: Negative for blurred vision, double vision, pain and redness.  Respiratory: Negative for cough, sputum production and wheezing.   Cardiovascular: Negative for chest pain, palpitations and leg swelling.  Gastrointestinal: Negative for heartburn, nausea, vomiting and abdominal pain.  Musculoskeletal: Negative for  back pain, joint pain and neck pain.  Skin: Positive for itching and rash.  Neurological: Negative for dizziness, tremors, sensory change, seizures, loss of consciousness and headaches.  Psychiatric/Behavioral: Negative for depression, suicidal ideas, hallucinations, memory loss and substance abuse. The patient is not nervous/anxious and does not have insomnia.      Past Medical Family, Social History:  reports that she has been smoking Cigarettes and E-cigarettes.  She has been smoking about 1.00 pack per day. She has never used smokeless tobacco. She reports that she drinks alcohol. She reports that she does not use illicit drugs. Current Place of Residence: [place] with boyfriend and daughter Place of Birth: born in [place], raised from 2nd grade up to [place]. "ok" childhood Family Members: raised by parents. 1 brother. Marital Status: Divorced married for 2 yrs to father of her daughter. Current boyfriend together for 15 yrs Children: 1 Sons: 0 Daughters: 22yo Relationships: support from boyfriend Education: tech- Research scientist (life sciences) Problems/Performance: denies. 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 Legal History: misdemeanor 7-8 yrs ago when sick  Hobbies/Interests: denies  Outpatient Encounter Prescriptions as of 05/29/2015  Medication Sig  . albuterol (PROVENTIL HFA;VENTOLIN HFA) 108 (90 BASE) MCG/ACT inhaler Inhale into the lungs every 6 (six) hours as needed for wheezing or shortness of breath.  . benazepril (LOTENSIN) 20 MG tablet Take 20 mg by mouth daily.  . busPIRone (BUSPAR) 10 MG tablet Three tablets twice a day  . cetirizine (ZYRTEC) 10 MG tablet Take 10 mg by mouth daily.  . cholecalciferol (VITAMIN D) 1000  UNITS tablet Take 1,000 Units by mouth daily.  . Clobetasol Propionate  (TEMOVATE) 0.05 % external spray Apply topically as directed.  Marland Kitchen GLIPIZIDE PO Take 5 mg by mouth 2 (two) times daily with a meal.   . levothyroxine (SYNTHROID, LEVOTHROID) 50 MCG tablet Take 50 mcg by mouth daily before breakfast.  . lithium carbonate 300 MG capsule Take 2 capsules (600 mg total) by mouth 2 (two) times daily with a meal.  . medroxyPROGESTERone (DEPO-PROVERA) 150 MG/ML injection Inject 150 mg into the muscle every 3 (three) months.  . metFORMIN (GLUCOPHAGE) 1000 MG tablet Take 1,000 mg by mouth 2 (two) times daily with a meal.  . methotrexate (RHEUMATREX) 2.5 MG tablet Take 7.5 mg by mouth once a week. Caution:Chemotherapy. Protect from light.  . montelukast (SINGULAIR) 10 MG tablet Take 10 mg by mouth at bedtime.  . Multiple Vitamin (MULTIVITAMIN) capsule Take 1 capsule by mouth daily.  . ranitidine (ZANTAC) 150 MG capsule Take 150 mg by mouth 2 (two) times daily.  . simvastatin (ZOCOR) 20 MG tablet Take 40 mg by mouth daily.   . traZODone (DESYREL) 150 MG tablet Take 2.5 tablets (375 mg total) by mouth at bedtime. 2 and 1/2 tablets  . triamcinolone cream (KENALOG) 0.1 % Apply 1 application topically 2 (two) times daily.  Marland Kitchen aspirin 81 MG tablet Take 81 mg by mouth daily. Reported on 05/29/2015  . ibuprofen (ADVIL,MOTRIN) 800 MG tablet Take 800 mg by mouth every 8 (eight) hours as needed. Reported on 05/29/2015  . meloxicam (MOBIC) 15 MG tablet Take 15 mg by mouth daily. Reported on 05/29/2015  . Omega-3 Fatty Acids (FISH OIL) 1000 MG CAPS Take by mouth. Reported on 05/29/2015   No facility-administered encounter medications on file as of 05/29/2015.    Past Psychiatric History/Hospitalization(s): Anxiety: No Bipolar Disorder: Yes Depression: Yes Mania: Yes Psychosis: No Schizophrenia: No Personality Disorder: Yes Hospitalization for psychiatric illness: Yes History of Electroconvulsive Shock Therapy: No Prior Suicide Attempts: Yes  Past Psychiatric History: Diagnosis:  Bipolar, Cannabis abuse, Borderline PD  Hospitalizations: multiple- last time was in 2014 at [facility] for mania  Outpatient Care: [facility] in [place], can't recall other psychiatrists. Has been in group and individual therapy in the past  Substance Abuse Care: drug class in 2014 for [facility]  Self-Mutilation: denies  Suicidal Attempts: yes- many years ago. Has hx of 2-3 previous attempts  Violent Behaviors: denies but later stated maybe when psychotic or manic?             Physical Exam: Constitutional:  BP 132/80 mmHg  Pulse 88  Ht 5' 6.5" (1.689 m)  Wt 250 lb 12.8 oz (113.762 kg)  BMI 39.88 kg/m2  General Appearance: alert, oriented, no acute distress  Musculoskeletal: Strength & Muscle Tone: within normal limits Gait & Station: normal Patient leans: straight. no leaning  Mental Status Examination/Evaluation: Objective: Attitude: Calm and cooperative  Appearance: Casual, appears to be stated age  Eye Contact::  Fair  Speech:  Clear and Coherent and Normal Rate  Volume:  Normal  Mood:  euthymic  Affect:  Blunt  Thought Process:  Goal Directed  Orientation:  Full (Time, Place, and Person)  Thought Content:  Negative  Suicidal Thoughts:  No  Homicidal Thoughts:  No  Judgement:  Fair  Insight:  Fair  Concentration: good  Memory: Immediate-fair Recent-fair Remote-fair  Recall: fair  Language: fair  Gait and Station: normal  [employer] of Knowledge: average  Psychomotor Activity:  Normal  Akathisia:  No  Handed:  Right  AIMS (if indicated):  n/a  Assets:  Communication Skills Desire for Improvement Intimacy Leisure Time Social Support Engineer, drilling (Choose Three): Established Problem, Stable/Improving (1), Review of Psycho-Social Stressors (1) and Review of Medication Regimen & Side Effects (2)  Assessment: AXIS I Bipolar I disorder- MSE unspecified; Cannabis abuse- in remission  AXIS II  Borderline Personality Dis.   Treatment Plan/Recommendations:  Plan of Care: Medication management with supportive therapy. Risks/benefits and SE of the medication discussed. Pt verbalized understanding and verbal consent obtained for treatment. Affirm with the patient that the medications are taken as ordered. Patient expressed understanding of how their medications were to be used.    Laboratory: reviewed labs from PCP 01/08/2015 Lithium level 0.9, TSH WNL, Glu 153, Trig 179, creat 0.53, AST 33, HbA1c 8.3  Pt is working with her PCP regarding her health issues.    Psychotherapy: Therapy: brief supportive therapy provided. Discussed psychosocial stressors in detail.    Medications: Continue Lithium 600mg  po BID for mood stabalization Buspar 30mg  po BID for adjunct mood stabalization Trazodone 375mg  po qHS for sleep  Routine PRN Medications: No  Consultations: declined therapy  Safety Concerns: Pt denies SI and is at an acute low risk for suicide. Pt's chronic risk of suicide is moderate due to hx of previous suicide attempts and noncompliance with meds. Patient told to call clinic if any problems occur. Patient advised to go to ER if they should develop SI/HI, side effects, or if symptoms worsen. Has crisis numbers to call if needed. Pt verbalized understanding.   Other: F/up in 4 months or sooner if needed           Charlcie Cradle, MD 05/29/2015

## 2015-05-29 NOTE — Patient Instructions (Signed)
Labs: Lithium level, CMP with Bun/Creatinine, TSH

## 2015-09-30 ENCOUNTER — Ambulatory Visit (HOSPITAL_COMMUNITY): Payer: Self-pay | Admitting: Psychiatry

## 2015-10-23 ENCOUNTER — Ambulatory Visit (INDEPENDENT_AMBULATORY_CARE_PROVIDER_SITE_OTHER): Payer: Managed Care, Other (non HMO) | Admitting: Psychiatry

## 2015-10-23 ENCOUNTER — Encounter (HOSPITAL_COMMUNITY): Payer: Self-pay | Admitting: Psychiatry

## 2015-10-23 VITALS — BP 124/76 | HR 78 | Ht 66.75 in | Wt 242.6 lb

## 2015-10-23 DIAGNOSIS — G4700 Insomnia, unspecified: Secondary | ICD-10-CM

## 2015-10-23 DIAGNOSIS — F411 Generalized anxiety disorder: Secondary | ICD-10-CM

## 2015-10-23 DIAGNOSIS — F319 Bipolar disorder, unspecified: Secondary | ICD-10-CM

## 2015-10-23 MED ORDER — BUSPIRONE HCL 10 MG PO TABS: ORAL_TABLET | ORAL | 3 refills | Status: DC

## 2015-10-23 MED ORDER — HYDROXYZINE PAMOATE 25 MG PO CAPS: 25.0000 mg | ORAL_CAPSULE | Freq: Two times a day (BID) | ORAL | 3 refills | Status: DC | PRN

## 2015-10-23 MED ORDER — TRAZODONE HCL 150 MG PO TABS: 375.0000 mg | ORAL_TABLET | Freq: Every day | ORAL | 3 refills | Status: DC

## 2015-10-23 MED ORDER — LITHIUM CARBONATE 300 MG PO CAPS: 600.0000 mg | ORAL_CAPSULE | Freq: Two times a day (BID) | ORAL | 3 refills | Status: DC

## 2015-10-23 NOTE — Progress Notes (Signed)
Patient ID: Valerie Reilly, female   DOB: [birth year], 40 y.o.   MRN: [id number] Patient ID: Valerie Reilly, female   DOB: Apr 17, 1975, 40 y.o.   MRN: [id number]  [facility] [id number] Progress Note  Valerie Reilly [id number] 40 y.o.  10/23/2015 8:56 AM  Chief Complaint: its going  History of Present Illness: Overall things are stable and going well. Denies any concerns today. States her only concern is finding a higher paying job. There is a lot of stuff going on at work and her fiance just had his 4th amputation.   Denies depression, sad mood, isolation, crying spells, anhedonia, worthlessness and hopelessness.   Sleep is fair and she is getting about 6 hrs with Trazodone. Energy, appetite and concentration are good.  Denies manic and hypomanic symptoms including periods of decreased need for sleep, increased energy, mood lability, impulsivity, FOI, and excessive spending.  Reports some mild anxiety but she is tolerating it. Denies racing thoughts. Denies panic attacks.   Denies any illicit drug use and has cut down on cig smoking to 1 ppd. She is not ready to quit yet.   Takes meds as prescribed and denies SE.   Suicidal Ideation: No Plan Formed: No Patient has means to carry out plan: No  Homicidal Ideation: No Plan Formed: No Patient has means to carry out plan: No  Review of Systems: Psychiatric: Agitation: No Hallucination: No Depressed Mood: No Insomnia: No Hypersomnia: No Altered Concentration: No Feels Worthless: No Grandiose Ideas: No Belief In Special Powers: No New/Increased Substance Abuse: No Compulsions: No    Review of Systems  Constitutional: Negative for chills, fever and weight loss.  HENT: Negative for congestion, ear pain and sore throat.   Eyes: Negative for blurred vision, double vision, pain and redness.  Respiratory: Negative for cough, sputum production and wheezing.   Cardiovascular: Negative for chest pain, palpitations and leg  swelling.  Gastrointestinal: Negative for abdominal pain, heartburn, nausea and vomiting.  Musculoskeletal: Negative for back pain, joint pain and neck pain.  Skin: Positive for itching and rash.  Neurological: Negative for dizziness, tremors, sensory change, seizures, loss of consciousness and headaches.  Psychiatric/Behavioral: Negative for depression, hallucinations, memory loss, substance abuse and suicidal ideas. The patient is nervous/anxious. The patient does not have insomnia.      Past Medical Family, Social History:  reports that she has been smoking Cigarettes and E-cigarettes.  She has been smoking about 1.00 pack per day. She has never used smokeless tobacco. She reports that she drinks alcohol. She reports that she does not use drugs. Current Place of Residence: [place] with boyfriend and daughter Place of Birth: born in [place], raised from 2nd grade up to [place]. "ok" childhood Family Members: raised by parents. 1 brother. Marital Status: Divorced married for 2 yrs to father of her daughter. Current boyfriend together for 15 yrs Children: 1 Sons: 0 Daughters: 22yo Relationships: support from boyfriend Education: tech- Research scientist (life sciences) Problems/Performance: denies. 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 Legal History: misdemeanor 7-8 yrs ago when sick  Hobbies/Interests: denies  Outpatient Encounter Prescriptions as of 10/23/2015  Medication Sig  . albuterol (PROVENTIL HFA;VENTOLIN HFA) 108 (90 BASE) MCG/ACT inhaler Inhale into the lungs every 6 (six) hours as needed for wheezing or shortness of breath.  Marland Kitchen aspirin 81 MG tablet Take 81 mg by mouth daily. Reported on 05/29/2015  . benazepril (LOTENSIN) 20  MG tablet Take 20 mg by mouth daily.  . busPIRone (BUSPAR) 10 MG tablet Three tablets  twice a day  . cetirizine (ZYRTEC) 10 MG tablet Take 10 mg by mouth daily.  . cholecalciferol (VITAMIN D) 1000 UNITS tablet Take 1,000 Units by mouth daily.  . Clobetasol Propionate (TEMOVATE) 0.05 % external spray Apply topically as directed.  . folic acid (FOLVITE) 1 MG tablet Take 1 mg by mouth daily.  Marland Kitchen GLIPIZIDE PO Take 5 mg by mouth 2 (two) times daily with a meal.   . ibuprofen (ADVIL,MOTRIN) 800 MG tablet Take 800 mg by mouth every 8 (eight) hours as needed. Reported on 05/29/2015  . levothyroxine (SYNTHROID, LEVOTHROID) 50 MCG tablet Take 50 mcg by mouth daily before breakfast.  . lithium carbonate 300 MG capsule Take 2 capsules (600 mg total) by mouth 2 (two) times daily with a meal.  . medroxyPROGESTERone (DEPO-PROVERA) 150 MG/ML injection Inject 150 mg into the muscle every 3 (three) months.  . meloxicam (MOBIC) 15 MG tablet Take 15 mg by mouth daily. Reported on 05/29/2015  . metFORMIN (GLUCOPHAGE) 1000 MG tablet Take 1,000 mg by mouth 2 (two) times daily with a meal.  . methotrexate (RHEUMATREX) 2.5 MG tablet Take 7.5 mg by mouth once a week. Caution:Chemotherapy. Protect from light.  . montelukast (SINGULAIR) 10 MG tablet Take 10 mg by mouth at bedtime.  . Multiple Vitamin (MULTIVITAMIN) capsule Take 1 capsule by mouth daily.  . Omega-3 Fatty Acids (FISH OIL) 1000 MG CAPS Take by mouth. Reported on 05/29/2015  . ranitidine (ZANTAC) 150 MG capsule Take 150 mg by mouth 2 (two) times daily.  . simvastatin (ZOCOR) 20 MG tablet Take 40 mg by mouth daily.   . traZODone (DESYREL) 150 MG tablet Take 2.5 tablets (375 mg total) by mouth at bedtime. 2 and 1/2 tablets  . triamcinolone cream (KENALOG) 0.1 % Apply 1 application topically 2 (two) times daily.   No facility-administered encounter medications on file as of 10/23/2015.     Past Psychiatric History/Hospitalization(s): Anxiety: No Bipolar Disorder: Yes Depression: Yes Mania: Yes Psychosis: No Schizophrenia: No Personality  Disorder: Yes Hospitalization for psychiatric illness: Yes History of Electroconvulsive Shock Therapy: No Prior Suicide Attempts: Yes  Past Psychiatric History: Diagnosis: Bipolar, Cannabis abuse, Borderline PD  Hospitalizations: multiple- last time was in 2014 at [facility] for mania  Outpatient Care: [facility] in [place], can't recall other psychiatrists. Has been in group and individual therapy in the past  Substance Abuse Care: drug class in [facility]  Self-Mutilation: denies  Suicidal Attempts: yes- many years ago. Has hx of 2-3 previous attempts  Violent Behaviors: denies but later stated maybe when psychotic or manic?             Physical Exam: Constitutional:  BP 124/76   Pulse 78   Ht 5' 6.75" (1.695 m)   Wt 242 lb 9.6 oz (110 kg)   BMI 38.28 kg/m   General Appearance: alert, oriented, no acute distress  Musculoskeletal: Strength & Muscle Tone: within normal limits Gait & Station: normal Patient leans: straight. no leaning  Mental Status Examination/Evaluation: Objective: Attitude: Calm and cooperative  Appearance: Casual, appears to be stated age  Eye Contact::  Fair  Speech:  Clear and Coherent and Normal Rate  Volume:  Normal  Mood:  anxious  Affect:  Blunt  Thought Process:  Goal Directed  Orientation:  Full (Time, Place, and Person)  Thought Content:  Negative  Suicidal Thoughts:  No  Homicidal Thoughts:  No  Judgement:  Fair  Insight:  Fair  Concentration: good  Memory: Immediate-fair Recent-fair Remote-fair  Recall: fair  Language: fair  Gait and Station: normal  [employer] of Knowledge: average  Psychomotor Activity:  Normal  Akathisia:  No  Handed:  Right  AIMS (if indicated):  n/a  Assets:  Communication Skills Desire for Improvement Intimacy Leisure Time Social Support Talents/Skills Transportation      Assessment: AXIS I Bipolar I disorder- MSE unspecified; Insomnia, Anxiety states, Cannabis abuse- in remission   AXIS II Borderline Personality Dis.   Treatment Plan/Recommendations:  Plan of Care: Medication management with supportive therapy. Risks/benefits and SE of the medication discussed. Pt verbalized understanding and verbal consent obtained for treatment. Affirm with the patient that the medications are taken as ordered. Patient expressed understanding of how their medications were to be used.    Laboratory: reviewed labs from PCP 01/08/2015 Lithium level 0.9, TSH WNL, Glu 153, Trig 179, creat 0.53, AST 33, HbA1c 8.3  Pt is working with her PCP regarding her health issues. Pt will send in copy of recent labs   Psychotherapy: Therapy: brief supportive therapy provided. Discussed psychosocial stressors in detail.    Medications: Continue Lithium 600mg  po BID for mood stabalization Buspar 30mg  po BID for adjunct mood stabalization Trazodone 375mg  po qHS for sleep Start trial of Vistaril 25mg  po BID prn anxiety  Routine PRN Medications: No  Consultations: declined therapy  Safety Concerns: Pt denies SI and is at an acute low risk for suicide. Pt's chronic risk of suicide is moderate due to hx of previous suicide attempts and noncompliance with meds. Patient told to call clinic if any problems occur. Patient advised to go to ER if they should develop SI/HI, side effects, or if symptoms worsen. Has crisis numbers to call if needed. Pt verbalized understanding.   Other: F/up in 4 months or sooner if needed           Charlcie Cradle, MD 10/23/2015

## 2016-02-10 ENCOUNTER — Telehealth (HOSPITAL_COMMUNITY): Payer: Self-pay

## 2016-02-10 NOTE — Telephone Encounter (Signed)
Patient called today to reschedule her appointment with Dr. Doyne Keel. She has changed jobs and does not have insurance right now. She rescheduled to 2/1.

## 2016-02-26 ENCOUNTER — Ambulatory Visit (HOSPITAL_COMMUNITY): Payer: Self-pay | Admitting: Psychiatry

## 2016-04-08 ENCOUNTER — Encounter (HOSPITAL_COMMUNITY): Payer: Self-pay | Admitting: Psychiatry

## 2016-04-08 ENCOUNTER — Ambulatory Visit (INDEPENDENT_AMBULATORY_CARE_PROVIDER_SITE_OTHER): Payer: BLUE CROSS/BLUE SHIELD | Admitting: Psychiatry

## 2016-04-08 DIAGNOSIS — F5105 Insomnia due to other mental disorder: Secondary | ICD-10-CM

## 2016-04-08 DIAGNOSIS — F99 Mental disorder, not otherwise specified: Secondary | ICD-10-CM

## 2016-04-08 DIAGNOSIS — F319 Bipolar disorder, unspecified: Secondary | ICD-10-CM | POA: Diagnosis not present

## 2016-04-08 DIAGNOSIS — Z7982 Long term (current) use of aspirin: Secondary | ICD-10-CM

## 2016-04-08 DIAGNOSIS — F411 Generalized anxiety disorder: Secondary | ICD-10-CM | POA: Diagnosis not present

## 2016-04-08 DIAGNOSIS — Z79899 Other long term (current) drug therapy: Secondary | ICD-10-CM

## 2016-04-08 MED ORDER — HYDROXYZINE PAMOATE 25 MG PO CAPS: 25.0000 mg | ORAL_CAPSULE | Freq: Two times a day (BID) | ORAL | 3 refills | Status: DC | PRN

## 2016-04-08 MED ORDER — LITHIUM CARBONATE 300 MG PO CAPS: 600.0000 mg | ORAL_CAPSULE | Freq: Two times a day (BID) | ORAL | 3 refills | Status: DC

## 2016-04-08 MED ORDER — BUSPIRONE HCL 10 MG PO TABS: ORAL_TABLET | ORAL | 3 refills | Status: DC

## 2016-04-08 MED ORDER — TRAZODONE HCL 150 MG PO TABS: 375.0000 mg | ORAL_TABLET | Freq: Every day | ORAL | 3 refills | Status: DC

## 2016-04-08 NOTE — Progress Notes (Signed)
Patient ID: Valerie Reilly, female   DOB: [birth year], 41 y.o.   MRN: [id number] Patient ID: Valerie Reilly, female   DOB: 07-Nov-1975, 41 y.o.   MRN: [id number]  [facility] [id number] Progress Note  Valerie Reilly [id number] 41 y.o.  04/08/2016 9:18 AM  Chief Complaint: I am alright  History of Present Illness: reviewed information below with patient on 04/08/16 and same as previous visits except as noted  Overall things are stable and going well. Denies any concerns today. She got a new job and likes it. Finances are better. She would like to get a new job in near future.    Denies depression, sad mood, isolation, crying spells, anhedonia, worthlessness and hopelessness.   Sleep is fair and she is getting about 6 hrs with Trazodone. Energy, appetite and concentration are good.  Denies manic and hypomanic symptoms including periods of decreased need for sleep, increased energy, mood lability, impulsivity, FOI, and excessive spending.  Reports some mild anxiety but she is tolerating it. Denies racing thoughts. Denies panic attacks. Pt takes Vistaril qD on work days and it helps calm her down.   Denies any illicit drug use and has cut down on cig smoking to 1 ppd. She is not ready to quit yet.   Takes meds as prescribed and denies SE.   Suicidal Ideation: No Plan Formed: No Patient has means to carry out plan: No  Homicidal Ideation: No Plan Formed: No Patient has means to carry out plan: No  Review of Systems: Psychiatric: Agitation: No Hallucination: No Depressed Mood: No Insomnia: No Hypersomnia: No Altered Concentration: No Feels Worthless: No Grandiose Ideas: No Belief In Special Powers: No New/Increased Substance Abuse: No Compulsions: No    Review of Systems  Gastrointestinal: Negative for abdominal pain, heartburn, nausea and vomiting.  Musculoskeletal: Negative for back pain, joint pain and neck pain.  Skin: Positive for itching and rash.  Neurological:  Negative for dizziness, tremors, sensory change, seizures, loss of consciousness and headaches.  Psychiatric/Behavioral: Negative for depression, hallucinations, substance abuse and suicidal ideas. The patient is nervous/anxious. The patient does not have insomnia.      Past Medical Family, Social History: reviewed information below with patient on 04/08/16 and same as previous visits except as noted    reports that she has been smoking Cigarettes and E-cigarettes.  She has been smoking about 1.00 pack per day. She has never used smokeless tobacco. She reports that she drinks alcohol. She reports that she does not use drugs. Current Place of Residence: [place] with boyfriend and daughter Place of Birth: born in [place], raised from 2nd grade up to [place]. "ok" childhood Family Members: raised by parents. 1 brother. Marital Status: Divorced married for 2 yrs to father of her daughter. Current boyfriend together for 15 yrs Children: 1 Sons: 0 Daughters: 22yo Relationships: support from boyfriend Education: tech- Research scientist (life sciences) Problems/Performance: denies. 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 Legal History: misdemeanor 7-8 yrs ago when sick  Hobbies/Interests: denies  Outpatient Encounter Prescriptions as of 04/08/2016  Medication Sig  . albuterol (PROVENTIL HFA;VENTOLIN HFA) 108 (90 BASE) MCG/ACT inhaler Inhale into the lungs every 6 (six) hours as needed for wheezing or shortness of breath.  Marland Kitchen aspirin 81 MG tablet Take 81 mg by mouth daily. Reported on 05/29/2015  . benazepril (LOTENSIN) 20 MG tablet Take 20 mg by mouth daily.  . busPIRone (  BUSPAR) 10 MG tablet Three tablets twice a day  . cetirizine (ZYRTEC) 10 MG tablet Take 10 mg by mouth daily.  . cholecalciferol (VITAMIN D) 1000 UNITS tablet  Take 1,000 Units by mouth daily.  . Clobetasol Propionate (TEMOVATE) 0.05 % external spray Apply topically as directed.  . folic acid (FOLVITE) 1 MG tablet Take 1 mg by mouth daily.  Marland Kitchen GLIPIZIDE PO Take 5 mg by mouth 2 (two) times daily with a meal.   . Guselkumab (TREMFYA) 100 MG/ML SOSY Inject into the skin.  . hydrOXYzine (VISTARIL) 25 MG capsule Take 1 capsule (25 mg total) by mouth 2 (two) times daily as needed for anxiety.  Marland Kitchen ibuprofen (ADVIL,MOTRIN) 800 MG tablet Take 800 mg by mouth every 8 (eight) hours as needed. Reported on 05/29/2015  . levothyroxine (SYNTHROID, LEVOTHROID) 50 MCG tablet Take 50 mcg by mouth daily before breakfast.  . lithium carbonate 300 MG capsule Take 2 capsules (600 mg total) by mouth 2 (two) times daily with a meal.  . medroxyPROGESTERone (DEPO-PROVERA) 150 MG/ML injection Inject 150 mg into the muscle every 3 (three) months.  . meloxicam (MOBIC) 15 MG tablet Take 15 mg by mouth daily. Reported on 05/29/2015  . metFORMIN (GLUCOPHAGE) 1000 MG tablet Take 1,000 mg by mouth 2 (two) times daily with a meal.  . montelukast (SINGULAIR) 10 MG tablet Take 10 mg by mouth at bedtime.  . Multiple Vitamin (MULTIVITAMIN) capsule Take 1 capsule by mouth daily.  . Omega-3 Fatty Acids (FISH OIL) 1000 MG CAPS Take by mouth. Reported on 05/29/2015  . ranitidine (ZANTAC) 150 MG capsule Take 150 mg by mouth 2 (two) times daily.  . simvastatin (ZOCOR) 20 MG tablet Take 40 mg by mouth daily.   . traZODone (DESYREL) 150 MG tablet Take 2.5 tablets (375 mg total) by mouth at bedtime. 2 and 1/2 tablets  . triamcinolone cream (KENALOG) 0.1 % Apply 1 application topically 2 (two) times daily.  . [DISCONTINUED] methotrexate (RHEUMATREX) 2.5 MG tablet Take 7.5 mg by mouth once a week. Caution:Chemotherapy. Protect from light.   No facility-administered encounter medications on file as of 04/08/2016.     Past Psychiatric History/Hospitalization(s): Anxiety: No Bipolar Disorder:  Yes Depression: Yes Mania: Yes Psychosis: No Schizophrenia: No Personality Disorder: Yes Hospitalization for psychiatric illness: Yes History of Electroconvulsive Shock Therapy: No Prior Suicide Attempts: Yes  Past Psychiatric History: Diagnosis: Bipolar, Cannabis abuse, Borderline PD  Hospitalizations: multiple- last time was in 2014 at [facility] for mania  Outpatient Care: [facility] in [place], can't recall other psychiatrists. Has been in group and individual therapy in the past  Substance Abuse Care: drug class in 2014 for [facility]  Self-Mutilation: denies  Suicidal Attempts: yes- many years ago. Has hx of 2-3 previous attempts  Violent Behaviors: denies but later stated maybe when psychotic or manic?             Physical Exam: Constitutional:  BP 140/88   Pulse 89   Ht 5\' 6"  (1.676 m)   Wt 248 lb 3.2 oz (112.6 kg)   BMI 40.06 kg/m   General Appearance: alert, oriented, no acute distress  Musculoskeletal: Strength & Muscle Tone: within normal limits Gait & Station: normal Patient leans: straight. no leaning  Mental Status Examination/Evaluation: reviewed MSE on 04/08/16 and same as previous visits except as noted  Objective: Attitude: Calm and cooperative  Appearance: Casual, appears to be stated age  Eye Contact::  Fair  Speech:  Clear and Coherent and Normal Rate  Volume:  Normal  Mood:  anxious  Affect:  Blunt  Thought Process:  Goal Directed  Orientation:  Full (Time, Place, and Person)  Thought Content:  Negative  Suicidal Thoughts:  No  Homicidal Thoughts:  No  Judgement:  Fair  Insight:  Fair  Concentration: good  Memory: Immediate-fair Recent-fair Remote-fair  Recall: fair  Language: fair  Gait and Station: normal  [employer] of Knowledge: average  Psychomotor Activity:  Normal  Akathisia:  No  Handed:  Right  AIMS (if indicated):  n/a  Assets:  Communication Skills Desire for Improvement Intimacy Leisure Time Social  Support Talents/Skills Transportation    reviewed A&P on 04/08/16 and same as previous visits except as noted   Assessment: AXIS I Bipolar I disorder- MSE unspecified; Insomnia, Anxiety states, Cannabis abuse- in remission  AXIS II Borderline Personality Dis.   Treatment Plan/Recommendations:  Plan of Care: Medication management with supportive therapy. Risks/benefits and SE of the medication discussed. Pt verbalized understanding and verbal consent obtained for treatment. Affirm with the patient that the medications are taken as ordered. Patient expressed understanding of how their medications were to be used.    Laboratory: Pt is working with her PCP regarding her health issues. Pt will send in copy of recent labs   Psychotherapy: Therapy: brief supportive therapy provided. Discussed psychosocial stressors in detail.    Medications: Continue Lithium 600mg  po BID for mood stabalization Buspar 30mg  po BID for adjunct mood stabalization Trazodone 375mg  po qHS for sleep  Vistaril 25mg  po BID prn anxiety  Routine PRN Medications: No  Consultations: declined therapy  Safety Concerns: Pt denies SI and is at an acute low risk for suicide. Pt's chronic risk of suicide is moderate due to hx of previous suicide attempts and noncompliance with meds. Patient told to call clinic if any problems occur. Patient advised to go to ER if they should develop SI/HI, side effects, or if symptoms worsen. Has crisis numbers to call if needed. Pt verbalized understanding.   Other: F/up in 4 months or sooner if needed           Charlcie Cradle, MD 04/08/2016

## 2016-08-12 ENCOUNTER — Ambulatory Visit (INDEPENDENT_AMBULATORY_CARE_PROVIDER_SITE_OTHER): Payer: BLUE CROSS/BLUE SHIELD | Admitting: Psychiatry

## 2016-08-12 ENCOUNTER — Encounter (HOSPITAL_COMMUNITY): Payer: Self-pay | Admitting: Psychiatry

## 2016-08-12 ENCOUNTER — Encounter (INDEPENDENT_AMBULATORY_CARE_PROVIDER_SITE_OTHER): Payer: Self-pay

## 2016-08-12 DIAGNOSIS — F17210 Nicotine dependence, cigarettes, uncomplicated: Secondary | ICD-10-CM

## 2016-08-12 DIAGNOSIS — Z7982 Long term (current) use of aspirin: Secondary | ICD-10-CM | POA: Diagnosis not present

## 2016-08-12 DIAGNOSIS — F319 Bipolar disorder, unspecified: Secondary | ICD-10-CM | POA: Diagnosis not present

## 2016-08-12 DIAGNOSIS — Z915 Personal history of self-harm: Secondary | ICD-10-CM

## 2016-08-12 DIAGNOSIS — Z791 Long term (current) use of non-steroidal anti-inflammatories (NSAID): Secondary | ICD-10-CM

## 2016-08-12 DIAGNOSIS — F411 Generalized anxiety disorder: Secondary | ICD-10-CM

## 2016-08-12 DIAGNOSIS — Z811 Family history of alcohol abuse and dependence: Secondary | ICD-10-CM | POA: Diagnosis not present

## 2016-08-12 DIAGNOSIS — F419 Anxiety disorder, unspecified: Secondary | ICD-10-CM

## 2016-08-12 DIAGNOSIS — G4700 Insomnia, unspecified: Secondary | ICD-10-CM

## 2016-08-12 DIAGNOSIS — F1221 Cannabis dependence, in remission: Secondary | ICD-10-CM

## 2016-08-12 DIAGNOSIS — Z79899 Other long term (current) drug therapy: Secondary | ICD-10-CM

## 2016-08-12 DIAGNOSIS — F5105 Insomnia due to other mental disorder: Secondary | ICD-10-CM

## 2016-08-12 DIAGNOSIS — Z7984 Long term (current) use of oral hypoglycemic drugs: Secondary | ICD-10-CM

## 2016-08-12 DIAGNOSIS — F99 Mental disorder, not otherwise specified: Secondary | ICD-10-CM

## 2016-08-12 DIAGNOSIS — Z818 Family history of other mental and behavioral disorders: Secondary | ICD-10-CM

## 2016-08-12 DIAGNOSIS — F17290 Nicotine dependence, other tobacco product, uncomplicated: Secondary | ICD-10-CM

## 2016-08-12 MED ORDER — HYDROXYZINE PAMOATE 25 MG PO CAPS: 25.0000 mg | ORAL_CAPSULE | Freq: Two times a day (BID) | ORAL | 3 refills | Status: DC | PRN

## 2016-08-12 MED ORDER — BUSPIRONE HCL 10 MG PO TABS: ORAL_TABLET | ORAL | 3 refills | Status: DC

## 2016-08-12 MED ORDER — LITHIUM CARBONATE 300 MG PO CAPS: 600.0000 mg | ORAL_CAPSULE | Freq: Two times a day (BID) | ORAL | 3 refills | Status: DC

## 2016-08-12 MED ORDER — TRAZODONE HCL 150 MG PO TABS: 375.0000 mg | ORAL_TABLET | Freq: Every day | ORAL | 3 refills | Status: DC

## 2016-08-12 NOTE — Progress Notes (Signed)
[facility] MD/PA/NP OP Progress Note  08/12/2016 9:37 AM Valerie Reilly  MRN:  [id number]  Chief Complaint:  Chief Complaint    Follow-up      HPI: "Everything is good". She is working at a call center now. Its ok but she prefers working in [employer] but there is no money in it.  Pt denies depression. Denies anhedonia, isolation, crying spells, low motivation, poor hygiene, worthlessness and hopelessness. Denies SI/HI.  Sleep is good and she is getting about 8 hrs.   Appetite is good.  Energy and concentration are good.  Denies manic and hypomanic symptoms including periods of decreased need for sleep, increased energy, mood lability, impulsivity, FOI, and excessive spending.  States anxiety is well controlled with Buspar and Vistaril.  Taking meds as prescribed and denies SE.    Visit Diagnosis:    ICD-10-CM   1. Bipolar I disorder (HCC) F31.9 traZODone (DESYREL) 150 MG tablet    lithium carbonate 300 MG capsule    busPIRone (BUSPAR) 10 MG tablet  2. Insomnia due to other mental disorder F51.05 traZODone (DESYREL) 150 MG tablet   F99   3. Anxiety state F41.1 hydrOXYzine (VISTARIL) 25 MG capsule       Past Psychiatric History: Diagnosis: Bipolar, Cannabis abuse, Borderline PD  Hospitalizations: multiple- last time was in 2014 at [facility] for mania  Outpatient Care: [facility] in [place], can't recall other psychiatrists. Has been in group and individual therapy in the past  Substance Abuse Care: drug class in 2014 for [facility]  Self-Mutilation: denies  Suicidal Attempts: yes- many years ago. Has hx of 2-3 previous attempts  Violent Behaviors: denies but later stated maybe when psychotic or manic?   Past Medical History:  Past Medical History:  Diagnosis Date  . Bipolar disorder ([place])   . Diabetes mellitus   . Diabetes mellitus, type II ([place])   . GERD (gastroesophageal reflux disease)   . History of borderline personality disorder   . Hypercholesteremia   .  Hypertension   . Psoriasis   . Seasonal allergies   . Thyroid disease     Past Surgical History:  Procedure Laterality Date  . TONSILLECTOMY      Family Psychiatric History: Family History  Problem Relation Age of Onset  . Personality disorder Mother   . Alcohol abuse Maternal Uncle   . Depression Maternal Uncle     Social History:  Social History   Social History  . Marital status: Divorced    Spouse name: N/A  . Number of children: N/A  . Years of education: N/A   Social History Main Topics  . Smoking status: Current Every Day Smoker    Packs/day: 1.00    Types: Cigarettes, E-cigarettes  . Smokeless tobacco: Never Used  . Alcohol use 0.0 oz/week     Comment: occasional- a few times a year. 6-7 drinks per episdoe  . Drug use: No     Comment: last time was 3 yrs ago  . Sexual activity: Not on file   Other Topics Concern  . Not on file   Social History Narrative  . No narrative on file    Allergies: No Known Allergies  Metabolic Disorder Labs: No results found for: HGBA1C, MPG No results found for: PROLACTIN No results found for: CHOL, TRIG, HDL, CHOLHDL, VLDL, LDLCALC   Current Medications: Current Outpatient Prescriptions  Medication Sig Dispense Refill  . albuterol (PROVENTIL HFA;VENTOLIN HFA) 108 (90 BASE) MCG/ACT inhaler Inhale into the lungs every 6 (six) hours  as needed for wheezing or shortness of breath.    Marland Kitchen aspirin 81 MG tablet Take 81 mg by mouth daily. Reported on 05/29/2015    . benazepril (LOTENSIN) 20 MG tablet Take 20 mg by mouth daily.    . busPIRone (BUSPAR) 10 MG tablet Three tablets twice a day 180 tablet 3  . cetirizine (ZYRTEC) 10 MG tablet Take 10 mg by mouth daily.    . cholecalciferol (VITAMIN D) 1000 UNITS tablet Take 1,000 Units by mouth daily.    . Clobetasol Propionate (TEMOVATE) 0.05 % external spray Apply topically as directed.    . folic acid (FOLVITE) 1 MG tablet Take 1 mg by mouth daily.    Marland Kitchen GLIPIZIDE PO Take 5 mg by  mouth 2 (two) times daily with a meal.     . Guselkumab (TREMFYA) 100 MG/ML SOSY Inject into the skin.    . hydrOXYzine (VISTARIL) 25 MG capsule Take 1 capsule (25 mg total) by mouth 2 (two) times daily as needed for anxiety. 60 capsule 3  . ibuprofen (ADVIL,MOTRIN) 800 MG tablet Take 800 mg by mouth every 8 (eight) hours as needed. Reported on 05/29/2015    . levothyroxine (SYNTHROID, LEVOTHROID) 50 MCG tablet Take 50 mcg by mouth daily before breakfast.    . lithium carbonate 300 MG capsule Take 2 capsules (600 mg total) by mouth 2 (two) times daily with a meal. 120 capsule 3  . medroxyPROGESTERone (DEPO-PROVERA) 150 MG/ML injection Inject 150 mg into the muscle every 3 (three) months.    . meloxicam (MOBIC) 15 MG tablet Take 15 mg by mouth daily. Reported on 05/29/2015    . metFORMIN (GLUCOPHAGE) 1000 MG tablet Take 1,000 mg by mouth 2 (two) times daily with a meal.    . montelukast (SINGULAIR) 10 MG tablet Take 10 mg by mouth at bedtime.    . Multiple Vitamin (MULTIVITAMIN) capsule Take 1 capsule by mouth daily.    . Omega-3 Fatty Acids (FISH OIL) 1000 MG CAPS Take by mouth. Reported on 05/29/2015    . ranitidine (ZANTAC) 150 MG capsule Take 150 mg by mouth 2 (two) times daily.    . simvastatin (ZOCOR) 20 MG tablet Take 40 mg by mouth daily.     . traZODone (DESYREL) 150 MG tablet Take 2.5 tablets (375 mg total) by mouth at bedtime. 2 and 1/2 tablets 75 tablet 3  . triamcinolone cream (KENALOG) 0.1 % Apply 1 application topically 2 (two) times daily.     No current facility-administered medications for this visit.     Musculoskeletal: Strength & Muscle Tone: within normal limits Gait & Station: normal Patient leans: N/A  Psychiatric Specialty Exam: Review of Systems  Musculoskeletal: Negative for back pain, falls, joint pain and neck pain.  Neurological: Negative for dizziness, tingling, tremors and headaches.  Psychiatric/Behavioral: Negative for depression, hallucinations, substance  abuse and suicidal ideas. The patient is nervous/anxious. The patient does not have insomnia.     Blood pressure 124/74, pulse 89, height 5' 6.75" (1.695 m), weight 257 lb (116.6 kg).Body mass index is 40.55 kg/m.  General Appearance: Casual  Eye Contact:  Good  Speech:  Clear and Coherent and Normal Rate  Volume:  Normal  Mood:  Euthymic  Affect:  Full Range  Thought Process:  Goal Directed and Descriptions of Associations: Intact  Orientation:  Full (Time, Place, and Person)  Thought Content: Logical   Suicidal Thoughts:  No  Homicidal Thoughts:  No  Memory:  Immediate;   Good Recent;  Good Remote;   Good  Judgement:  Good  Insight:  Good  Psychomotor Activity:  Normal  Concentration:  Concentration: Good and Attention Span: Good  Recall:  Good  Fund of Knowledge: Good  Language: Good  Akathisia:  No  Handed:  Right  AIMS (if indicated):  n/a  Assets:  Communication Skills Desire for Improvement Housing  ADL's:  Intact  Cognition: WNL  Sleep:  good     Treatment Plan Summary:Medication management  Assessment: Bipolar I disorder; Anxiety State; Insomnia; Cannabis abuse in remission   Medication management with supportive therapy. Risks/benefits and SE of the medication discussed. Pt verbalized understanding and verbal consent obtained for treatment.  Affirm with the patient that the medications are taken as ordered. Patient expressed understanding of how their medications were to be used.   Meds: Lithium 600mg  po BID for Bipolar disorder Buspar 30mg  po BID for adjunct mood stabilization and anxiety Trazodone 375mg  po qHS for sleep Vistaril 25mg  po BID prn anxiety   Labs: pt will bring in copy of recent labs    Therapy: brief supportive therapy provided. Discussed psychosocial stressors in detail.    Consultations: none  Pt denies SI and is at an acute low risk for suicide. Patient told to call clinic if any problems occur. Patient advised to go to ER if they  should develop SI/HI, side effects, or if symptoms worsen. Has crisis numbers to call if needed. Pt verbalized understanding.  F/up in 4 months or sooner if needed   Charlcie Cradle, MD 08/12/2016, 9:37 AM

## 2016-08-18 DIAGNOSIS — E1165 Type 2 diabetes mellitus with hyperglycemia: Secondary | ICD-10-CM | POA: Insufficient documentation

## 2016-12-16 ENCOUNTER — Ambulatory Visit (HOSPITAL_COMMUNITY): Payer: Self-pay | Admitting: Psychiatry

## 2016-12-30 ENCOUNTER — Encounter (HOSPITAL_COMMUNITY): Payer: Self-pay | Admitting: Psychiatry

## 2016-12-30 ENCOUNTER — Ambulatory Visit (INDEPENDENT_AMBULATORY_CARE_PROVIDER_SITE_OTHER): Payer: BLUE CROSS/BLUE SHIELD | Admitting: Psychiatry

## 2016-12-30 DIAGNOSIS — F411 Generalized anxiety disorder: Secondary | ICD-10-CM | POA: Diagnosis not present

## 2016-12-30 DIAGNOSIS — F17290 Nicotine dependence, other tobacco product, uncomplicated: Secondary | ICD-10-CM

## 2016-12-30 DIAGNOSIS — Z79899 Other long term (current) drug therapy: Secondary | ICD-10-CM

## 2016-12-30 DIAGNOSIS — F17210 Nicotine dependence, cigarettes, uncomplicated: Secondary | ICD-10-CM

## 2016-12-30 DIAGNOSIS — Z811 Family history of alcohol abuse and dependence: Secondary | ICD-10-CM

## 2016-12-30 DIAGNOSIS — F99 Mental disorder, not otherwise specified: Secondary | ICD-10-CM

## 2016-12-30 DIAGNOSIS — F319 Bipolar disorder, unspecified: Secondary | ICD-10-CM | POA: Diagnosis not present

## 2016-12-30 DIAGNOSIS — Z915 Personal history of self-harm: Secondary | ICD-10-CM | POA: Diagnosis not present

## 2016-12-30 DIAGNOSIS — Z818 Family history of other mental and behavioral disorders: Secondary | ICD-10-CM

## 2016-12-30 DIAGNOSIS — F5105 Insomnia due to other mental disorder: Secondary | ICD-10-CM | POA: Diagnosis not present

## 2016-12-30 MED ORDER — LITHIUM CARBONATE 300 MG PO CAPS: 600.0000 mg | ORAL_CAPSULE | Freq: Two times a day (BID) | ORAL | 3 refills | Status: DC

## 2016-12-30 MED ORDER — BUSPIRONE HCL 10 MG PO TABS: ORAL_TABLET | ORAL | 3 refills | Status: DC

## 2016-12-30 MED ORDER — TRAZODONE HCL 150 MG PO TABS: 375.0000 mg | ORAL_TABLET | Freq: Every day | ORAL | 3 refills | Status: DC

## 2016-12-30 MED ORDER — HYDROXYZINE PAMOATE 25 MG PO CAPS: 25.0000 mg | ORAL_CAPSULE | Freq: Two times a day (BID) | ORAL | 3 refills | Status: DC | PRN

## 2016-12-30 NOTE — Patient Instructions (Signed)
Needed labs: Lithium level, CMP, TSH

## 2016-12-30 NOTE — Progress Notes (Signed)
[place] MD/PA/NP OP Progress Note  12/30/2016 9:53 AM Valerie Reilly  MRN:  [id number]  Chief Complaint:  Chief Complaint    Follow-up     HPI: Pt got a new kitten. She is happy and finds it relaxing.  Pt is working but looking for a new, better paying job.  Pt denies depression. Denies anhedonia, isolation, crying spells, low motivation, poor hygiene, worthlessness and hopelessness. Denies SI/HI.  Sleep is good and she is getting about 8 hrs/night with Trazodone.  Appetite, energy and concentration are good.  Pt denies manic and hypomanic symptoms including periods of decreased need for sleep, increased energy, mood lability, impulsivity, FOI, and excessive spending.  Anxiety is mild. She takes the Vistaril BID and it helps to keep her relaxed. She is no longer shaking.  Pt states-taking meds as prescribed and denies SE.    Visit Diagnosis:    ICD-10-CM   1. Bipolar I disorder (HCC) F31.9 busPIRone (BUSPAR) 10 MG tablet    traZODone (DESYREL) 150 MG tablet    lithium carbonate 300 MG capsule  2. Insomnia due to other mental disorder F51.05 traZODone (DESYREL) 150 MG tablet   F99   3. Anxiety state F41.1 hydrOXYzine (VISTARIL) 25 MG capsule     Past Psychiatric History:  Diagnosis: Bipolar, Cannabis abuse, Borderline PD  Hospitalizations: multiple- last time was in 2014 at [facility] for mania  Outpatient Care: [facility] in [place], can't recall other psychiatrists. Has been in group and individual therapy in the past  Substance Abuse Care: drug class in 2014 for [facility]  Self-Mutilation: denies  Suicidal Attempts: yes- many years ago. Has hx of 2-3 previous attempts  Violent Behaviors: denies but later stated maybe when psychotic or manic?     Past Medical History:  Past Medical History:  Diagnosis Date  . Bipolar disorder ([place])   . Diabetes mellitus   . Diabetes mellitus, type II ([place])   . GERD (gastroesophageal reflux disease)   . History of borderline personality  disorder   . Hypercholesteremia   . Hypertension   . Psoriasis   . Seasonal allergies   . Thyroid disease     Past Surgical History:  Procedure Laterality Date  . TONSILLECTOMY      Family Psychiatric History:  Family History  Problem Relation Age of Onset  . Personality disorder Mother   . Alcohol abuse Maternal Uncle   . Depression Maternal Uncle     Social History:  Social History   Social History  . Marital status: Divorced    Spouse name: N/A  . Number of children: N/A  . Years of education: N/A   Social History Main Topics  . Smoking status: Current Every Day Smoker    Packs/day: 1.00    Types: Cigarettes, E-cigarettes  . Smokeless tobacco: Never Used  . Alcohol use 0.0 oz/week     Comment: occasional- a few times a year. 6-7 drinks per episdoe  . Drug use: No     Comment: last time was 3 yrs ago  . Sexual activity: Not Asked   Other Topics Concern  . None   Social History Narrative  . None    Allergies: No Known Allergies  Metabolic Disorder Labs: No results found for: HGBA1C, MPG No results found for: PROLACTIN No results found for: CHOL, TRIG, HDL, CHOLHDL, VLDL, LDLCALC No results found for: TSH  Therapeutic Level Labs: No results found for: LITHIUM No results found for: VALPROATE No components found for:  CBMZ  Current Medications:  Current Outpatient Prescriptions  Medication Sig Dispense Refill  . albuterol (PROVENTIL HFA;VENTOLIN HFA) 108 (90 BASE) MCG/ACT inhaler Inhale into the lungs every 6 (six) hours as needed for wheezing or shortness of breath.    . benazepril (LOTENSIN) 20 MG tablet Take 20 mg by mouth daily.    . busPIRone (BUSPAR) 10 MG tablet Three tablets twice a day 180 tablet 3  . cetirizine (ZYRTEC) 10 MG tablet Take 10 mg by mouth daily.    . cholecalciferol (VITAMIN D) 1000 UNITS tablet Take 1,000 Units by mouth daily.    Marland Kitchen GLIPIZIDE PO Take 5 mg by mouth 2 (two) times daily with a meal.     . Guselkumab (TREMFYA) 100  MG/ML SOSY Inject into the skin.    . hydrOXYzine (VISTARIL) 25 MG capsule Take 1 capsule (25 mg total) by mouth 2 (two) times daily as needed for anxiety. 60 capsule 3  . ibuprofen (ADVIL,MOTRIN) 800 MG tablet Take 800 mg by mouth every 8 (eight) hours as needed. Reported on 05/29/2015    . levothyroxine (SYNTHROID, LEVOTHROID) 50 MCG tablet Take 50 mcg by mouth daily before breakfast.    . lithium carbonate 300 MG capsule Take 2 capsules (600 mg total) by mouth 2 (two) times daily with a meal. 120 capsule 3  . medroxyPROGESTERone (DEPO-PROVERA) 150 MG/ML injection Inject 150 mg into the muscle every 3 (three) months.    . metFORMIN (GLUCOPHAGE) 1000 MG tablet Take 1,000 mg by mouth 2 (two) times daily with a meal.    . montelukast (SINGULAIR) 10 MG tablet Take 10 mg by mouth at bedtime.    . Multiple Vitamin (MULTIVITAMIN) capsule Take 1 capsule by mouth daily.    . ranitidine (ZANTAC) 150 MG capsule Take 150 mg by mouth 2 (two) times daily.    . simvastatin (ZOCOR) 20 MG tablet Take 40 mg by mouth daily.     . traZODone (DESYREL) 150 MG tablet Take 2.5 tablets (375 mg total) by mouth at bedtime. 2 and 1/2 tablets 75 tablet 3   No current facility-administered medications for this visit.      Musculoskeletal: Strength & Muscle Tone: within normal limits Gait & Station: normal Patient leans: N/A  Psychiatric Specialty Exam: Review of Systems  Gastrointestinal: Negative for abdominal pain, constipation, diarrhea, nausea and vomiting.  Neurological: Negative for dizziness, tingling, tremors, sensory change and headaches.  Psychiatric/Behavioral: Negative for depression, hallucinations, substance abuse and suicidal ideas. The patient is not nervous/anxious and does not have insomnia.     Blood pressure 136/82, pulse 84, height 5' 6.3" (1.684 m), weight 246 lb 9.6 oz (111.9 kg).Body mass index is 39.44 kg/m.  General Appearance: Casual  Eye Contact:  Good  Speech:  Clear and Coherent and  Normal Rate  Volume:  Normal  Mood:  Euthymic  Affect:  Full Range  Thought Process:  Goal Directed and Descriptions of Associations: Intact  Orientation:  Full (Time, Place, and Person)  Thought Content: Logical   Suicidal Thoughts:  No  Homicidal Thoughts:  No  Memory:  Immediate;   Good Recent;   Good Remote;   Good  Judgement:  Good  Insight:  Good  Psychomotor Activity:  Normal  Concentration:  Concentration: Good and Attention Span: Good  Recall:  Good  Fund of Knowledge: Good  Language: Good  Akathisia:  No  Handed:  Right  AIMS (if indicated): not done  Assets:  Communication Skills Desire for Improvement Financial Resources/Insurance Housing Intimacy Leisure Time Resilience  Social Support Talents/Skills Transportation  ADL's:  Intact  Cognition: WNL  Sleep:  Good   Screenings:   Assessment and Plan: Bipolar I d/o; Anxiety; Insomnia; Cannabis abuse in remission   Medication management with supportive therapy. Risks/benefits and SE of the medication discussed. Pt verbalized understanding and verbal consent obtained for treatment.  Affirm with the patient that the medications are taken as ordered. Patient expressed understanding of how their medications were to be used.   Meds:  Lithium 600mg  po BID for Bipolar disorder Buspar 30mg  po BID for adjunct mood stabilization and anxiety Trazodone 375mg  po qHS for sleep Vistaril 25mg  po BID prn anxiety   Labs: Lithium level, CMP with Bun/Creatinine, TSH   Therapy: brief supportive therapy provided. Discussed psychosocial stressors in detail.    Consultations: none  Pt denies SI and is at an acute low risk for suicide. Patient told to call clinic if any problems occur. Patient advised to go to ER if they should develop SI/HI, side effects, or if symptoms worsen. Has crisis numbers to call if needed. Pt verbalized understanding.  F/up in 4 months or sooner if needed     Charlcie Cradle, MD 12/30/2016, 9:53  AM

## 2017-03-09 ENCOUNTER — Telehealth: Payer: Self-pay | Admitting: Hematology and Oncology

## 2017-03-09 NOTE — Telephone Encounter (Signed)
Left message on daughter's voicemail regarding appointment D/T/Loc/Ph#.  I was unable to leave message for patient at there # due to no voicemail

## 2017-03-16 ENCOUNTER — Telehealth: Payer: Self-pay | Admitting: Hematology and Oncology

## 2017-03-16 NOTE — Telephone Encounter (Signed)
Patient called to move 1/30 appointment from PM to AM. Patient given new time for 1/30 new patient appointment at 9 am. Location confirmed with patient.

## 2017-04-06 ENCOUNTER — Inpatient Hospital Stay: Payer: Self-pay | Attending: Hematology and Oncology

## 2017-04-06 ENCOUNTER — Inpatient Hospital Stay: Payer: BLUE CROSS/BLUE SHIELD | Attending: Hematology and Oncology | Admitting: Hematology and Oncology

## 2017-04-06 ENCOUNTER — Telehealth: Payer: Self-pay | Admitting: Hematology and Oncology

## 2017-04-06 ENCOUNTER — Encounter: Payer: Self-pay | Admitting: Hematology and Oncology

## 2017-04-06 VITALS — BP 131/83 | HR 71 | Temp 98.2°F | Resp 18 | Ht 66.3 in | Wt 250.2 lb

## 2017-04-06 DIAGNOSIS — I10 Essential (primary) hypertension: Secondary | ICD-10-CM | POA: Diagnosis not present

## 2017-04-06 DIAGNOSIS — F319 Bipolar disorder, unspecified: Secondary | ICD-10-CM

## 2017-04-06 DIAGNOSIS — D72829 Elevated white blood cell count, unspecified: Secondary | ICD-10-CM | POA: Insufficient documentation

## 2017-04-06 DIAGNOSIS — E119 Type 2 diabetes mellitus without complications: Secondary | ICD-10-CM

## 2017-04-06 DIAGNOSIS — E039 Hypothyroidism, unspecified: Secondary | ICD-10-CM

## 2017-04-06 DIAGNOSIS — E559 Vitamin D deficiency, unspecified: Secondary | ICD-10-CM

## 2017-04-06 DIAGNOSIS — Z720 Tobacco use: Secondary | ICD-10-CM

## 2017-04-06 LAB — CMP (CANCER CENTER ONLY)
ALT: 19 U/L (ref 0–55)
AST: 14 U/L (ref 5–34)
Albumin: 3.9 g/dL (ref 3.5–5.0)
Alkaline Phosphatase: 62 U/L (ref 40–150)
Anion gap: 8 (ref 3–11)
BUN: 13 mg/dL (ref 7–26)
CO2: 25 mmol/L (ref 22–29)
Calcium: 9.9 mg/dL (ref 8.4–10.4)
Chloride: 103 mmol/L (ref 98–109)
Creatinine: 0.8 mg/dL (ref 0.60–1.10)
GFR, Est AFR Am: >60 mL/min (ref >60)
GFR, Estimated: >60 mL/min (ref >60)
Glucose, Bld: 218 mg/dL — ABNORMAL HIGH (ref 70–140)
Potassium: 4.4 mmol/L (ref 3.5–5.1)
Sodium: 136 mmol/L (ref 136–145)
Total Bilirubin: 0.4 mg/dL (ref 0.2–1.2)
Total Protein: 6.9 g/dL (ref 6.4–8.3)

## 2017-04-06 LAB — CBC WITH DIFFERENTIAL (CANCER CENTER ONLY)
Basophils Absolute: 0.1 10*3/uL (ref 0.0–0.1)
Basophils Relative: 1 %
Eosinophils Absolute: 0.4 10*3/uL (ref 0.0–0.5)
Eosinophils Relative: 3 %
HCT: 44 % (ref 34.8–46.6)
Hemoglobin: 14.8 g/dL (ref 11.6–15.9)
Lymphocytes Relative: 31 %
Lymphs Abs: 3.3 10*3/uL (ref 0.9–3.3)
MCH: 31 pg (ref 25.1–34.0)
MCHC: 33.6 g/dL (ref 31.5–36.0)
MCV: 92.1 fL (ref 79.5–101.0)
Monocytes Absolute: 0.8 10*3/uL (ref 0.1–0.9)
Monocytes Relative: 8 %
Neutro Abs: 6 10*3/uL (ref 1.5–6.5)
Neutrophils Relative %: 57 %
Platelet Count: 288 10*3/uL (ref 145–400)
RBC: 4.78 MIL/uL (ref 3.70–5.45)
RDW: 13 % (ref 11.2–14.5)
WBC Count: 10.5 10*3/uL — ABNORMAL HIGH (ref 3.9–10.3)

## 2017-04-06 LAB — LACTATE DEHYDROGENASE: LDH: 93 U/L — ABNORMAL LOW (ref 125–245)

## 2017-04-06 NOTE — Telephone Encounter (Signed)
Gave avs and sent to the lab

## 2017-04-24 NOTE — Progress Notes (Signed)
Valerie Reilly New Visit:  Assessment: Leukocytosis 42 y.o. 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  Most recent available lab work provided with a referral is about 71 months old with no more recent lab work available at this time.  By appearance, we are dealing with a mature neutrophilic leukocytosis.  Differential includes reactive leukocytosis due to ongoing tobacco abuse versus primary hematological condition such as chronic myeloid leukemia.  Plan: -We will  request updated lab work from the  referring provider, if available. -Obtain labs today as outlined below. -Return to our clinic in 1 week for review  Voice recognition software was used and creation of this note. Despite my best effort at editing the text, some misspelling/errors may have occurred.  Orders Placed This Encounter  Procedures  . CBC with Differential ([place] Only)  . CMP ([place] only)  . Lactate dehydrogenase (LDH)    All questions were answered.  . The patient knows to call the clinic with any problems, questions or concerns.  This note was electronically signed.    History of Presenting Illness Valerie Reilly 42 y.o. presenting to the [facility] for evaluation of leukocytosis, referred by Dr Verner Chol. Patient's past medical history significant for hypertension, current 2 diabetes mellitus, hypothyroidism, morbid obesity, Vitamin D deficiency, and bipolar disorder type I.  Patient is current active tobacco user, but expects quitting today.  Minimal amounts of alcohol with 1 drink approximately every 3 months and denies use of recreational substances.  Patient denies any fevers, chills, night sweats.  No skin rashes, swollen lymph nodes, shortness of breath, cough, nausea, vomiting, abdominal pain, diarrhea, constipation, dysuria, or hematuria.  Patient did have a bout of bronchitis approximately  weeks ago which was treated with azithromycin.   No residual symptoms from that.   Oncological/hematological History: --Labs, 08/01/11: WBC 17.2, ANC 11.6, ALC 4.0, Mono 1.2, Eos   ..., Baso 0.1, Hgb 14.9,              Plt 379 --Labs, 09/25/12: WBC 12.4, ANC   9.9, ALC 1.4, Mono 1.1, Eos   ..., Baso 0.1, Hgb 12.6,              Plt 278 --Labs, 12/05/13: WBC 12.3, ANC   7.8, ALC 3.2, Mono 0.8, Eos 0.5, Baso 0.1, Hgb 14.3,              Plt 287 --Labs, 07/07/15: WBC 10.1, ANC   6.8, ALC 2.4, Mono 0.5, Eos 0.3, Baso 0.0, Hgb 14.1,              Plt 310 --Labs, 07/27/16: WBC 12.5,              Hgb 14.2, MCV 96.0, MCH 31.7, Plt 284; Hgb A1c 7.6%   Medical History: Past Medical History:  Diagnosis Date  . Bipolar disorder ([place])   . Diabetes mellitus   . Diabetes mellitus, type II ([place])   . GERD (gastroesophageal reflux disease)   . History of borderline personality disorder   . Hypercholesteremia   . Hypertension   . Psoriasis   . Seasonal allergies   . Thyroid disease     Surgical History: Past Surgical History:  Procedure Laterality Date  . TONSILLECTOMY      Family History: Family History  Problem Relation Age of Onset  . Personality disorder Mother   . Alcohol abuse Maternal Uncle   . Depression Maternal Uncle     Social  History: Social History   Socioeconomic History  . Marital status: Divorced    Spouse name: Not on file  . Number of children: Not on file  . Years of education: Not on file  . Highest education level: Not on file  Social Needs  . Financial resource strain: Not on file  . Food insecurity - worry: Not on file  . Food insecurity - inability: Not on file  . Transportation needs - medical: Not on file  . Transportation needs - non-medical: Not on file  Occupational History  . Not on file  Tobacco Use  . Smoking status: Current Every Day Smoker    Packs/day: 1.00    Types: Cigarettes, E-cigarettes  . Smokeless tobacco: Never Used  Substance and Sexual Activity  . Alcohol use: Yes     Alcohol/week: 0.0 oz    Comment: occasional- a few times a year. 6-7 drinks per episdoe  . Drug use: No    Comment: last time was 3 yrs ago  . Sexual activity: Not on file  Other Topics Concern  . Not on file  Social History Narrative  . Not on file    Allergies: No Known Allergies  Medications:  Current Outpatient Medications  Medication Sig Dispense Refill  . albuterol (PROVENTIL HFA;VENTOLIN HFA) 108 (90 BASE) MCG/ACT inhaler Inhale into the lungs every 6 (six) hours as needed for wheezing or shortness of breath.    . benazepril (LOTENSIN) 20 MG tablet Take 20 mg by mouth daily.    . busPIRone (BUSPAR) 10 MG tablet Three tablets twice a day 180 tablet 3  . cetirizine (ZYRTEC) 10 MG tablet Take 10 mg by mouth daily.    . cholecalciferol (VITAMIN D) 1000 UNITS tablet Take 1,000 Units by mouth daily.    . hydrOXYzine (VISTARIL) 25 MG capsule Take 1 capsule (25 mg total) by mouth 2 (two) times daily as needed for anxiety. 60 capsule 3  . levothyroxine (SYNTHROID, LEVOTHROID) 50 MCG tablet Take 50 mcg by mouth daily before breakfast.    . lithium carbonate 300 MG capsule Take 2 capsules (600 mg total) by mouth 2 (two) times daily with a meal. 120 capsule 3  . medroxyPROGESTERone (DEPO-PROVERA) 150 MG/ML injection Inject 150 mg into the muscle every 3 (three) months.    . metFORMIN (GLUCOPHAGE) 1000 MG tablet Take 750 mg by mouth 2 (two) times daily with a meal.     . montelukast (SINGULAIR) 10 MG tablet Take 10 mg by mouth at bedtime.    . Multiple Vitamin (MULTIVITAMIN) capsule Take 1 capsule by mouth daily.    . ranitidine (ZANTAC) 150 MG capsule Take 150 mg by mouth 2 (two) times daily.    . simvastatin (ZOCOR) 20 MG tablet Take 40 mg by mouth daily.     . traZODone (DESYREL) 150 MG tablet Take 2.5 tablets (375 mg total) by mouth at bedtime. 2 and 1/2 tablets 75 tablet 3   No current facility-administered medications for this visit.     Review of Systems: Review of Systems  All  other systems reviewed and are negative.    PHYSICAL EXAMINATION Blood pressure 131/83, pulse 71, temperature 98.2 F (36.8 C), temperature source Oral, resp. rate 18, height 5' 6.3" (1.684 m), weight 250 lb 3.2 oz (113.5 kg), SpO2 100 %.  ECOG PERFORMANCE STATUS: 1 - Symptomatic but completely ambulatory  Physical Exam  Constitutional: She is oriented to person, place, and time and well-developed, well-nourished, and in no distress. No distress.  HENT:  Head: Normocephalic and atraumatic.  Mouth/Throat: Oropharynx is clear and moist. No oropharyngeal exudate.  Eyes: Conjunctivae and EOM are normal. Pupils are equal, round, and reactive to light. No scleral icterus.  Neck: No thyromegaly present.  Cardiovascular: Normal rate, regular rhythm and normal heart sounds.  No murmur heard. Pulmonary/Chest: Effort normal and breath sounds normal. No respiratory distress. She has no wheezes. She has no rales.  Abdominal: Soft. Bowel sounds are normal. She exhibits no distension and no mass. There is no tenderness. There is no guarding.  Musculoskeletal: Normal range of motion. She exhibits no edema.  Lymphadenopathy:    She has no cervical adenopathy.  Neurological: She is alert and oriented to person, place, and time. She has normal reflexes. No cranial nerve deficit.  Skin: Skin is warm and dry. No rash noted. She is not diaphoretic. No erythema. No pallor.     LABORATORY DATA: I have personally reviewed the data as listed: Office Visit on 04/06/2017  Component Date Value Ref Range Status  . WBC Count 04/06/2017 10.5* 3.9 - 10.3 K/uL Final  . RBC 04/06/2017 4.78  3.70 - 5.45 MIL/uL Final  . Hemoglobin 04/06/2017 14.8  11.6 - 15.9 g/dL Final  . HCT 04/06/2017 44.0  34.8 - 46.6 % Final  . MCV 04/06/2017 92.1  79.5 - 101.0 fL Final  . MCH 04/06/2017 31.0  25.1 - 34.0 pg Final  . MCHC 04/06/2017 33.6  31.5 - 36.0 g/dL Final  . RDW 04/06/2017 13.0  11.2 - 14.5 % Final  . Platelet Count  04/06/2017 288  145 - 400 K/uL Final  . Neutrophils Relative % 04/06/2017 57  % Final  . Neutro Abs 04/06/2017 6.0  1.5 - 6.5 K/uL Final  . Lymphocytes Relative 04/06/2017 31  % Final  . Lymphs Abs 04/06/2017 3.3  0.9 - 3.3 K/uL Final  . Monocytes Relative 04/06/2017 8  % Final  . Monocytes Absolute 04/06/2017 0.8  0.1 - 0.9 K/uL Final  . Eosinophils Relative 04/06/2017 3  % Final  . Eosinophils Absolute 04/06/2017 0.4  0.0 - 0.5 K/uL Final  . Basophils Relative 04/06/2017 1  % Final  . Basophils Absolute 04/06/2017 0.1  0.0 - 0.1 K/uL Final   Performed at [facility], [place] [address] [place], [place] [address]  . Sodium 04/06/2017 136  136 - 145 mmol/L Final  . Potassium 04/06/2017 4.4  3.5 - 5.1 mmol/L Final  . Chloride 04/06/2017 103  98 - 109 mmol/L Final  . CO2 04/06/2017 25  22 - 29 mmol/L Final  . Glucose, Bld 04/06/2017 218* 70 - 140 mg/dL Final  . BUN 04/06/2017 13  7 - 26 mg/dL Final  . Creatinine 04/06/2017 0.80  0.60 - 1.10 mg/dL Final  . Calcium 04/06/2017 9.9  8.4 - 10.4 mg/dL Final  . Total Protein 04/06/2017 6.9  6.4 - 8.3 g/dL Final  . Albumin 04/06/2017 3.9  3.5 - 5.0 g/dL Final  . AST 04/06/2017 14  5 - 34 U/L Final  . ALT 04/06/2017 19  0 - 55 U/L Final  . Alkaline Phosphatase 04/06/2017 62  40 - 150 U/L Final  . Total Bilirubin 04/06/2017 0.4  0.2 - 1.2 mg/dL Final  . GFR, Est Non Af Am 04/06/2017 >60  >60 mL/min Final  . GFR, Est AFR Am 04/06/2017 >60  >60 mL/min Final   Comment: (NOTE) The eGFR has been calculated using the CKD EPI equation. This calculation has not been  validated in all clinical situations. eGFR's persistently <60 mL/min signify possible Chronic Kidney Disease.   Georgiann Hahn gap 04/06/2017 8  3 - 11 Final   Performed at [facility], [place] [address], [place], [place] [address]  . LDH 04/06/2017 93* 125 - 245 U/L Final   Performed at [facility], [place] [address],  [place],  [address]         Ardath Sax, MD

## 2017-04-24 NOTE — Assessment & Plan Note (Signed)
42 y.o. 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  Most recent available lab work provided with a referral is about 38 months old with no more recent lab work available at this time.  By appearance, we are dealing with a mature neutrophilic leukocytosis.  Differential includes reactive leukocytosis due to ongoing tobacco abuse versus primary hematological condition such as chronic myeloid leukemia.  Plan: -We will  request updated lab work from the  referring provider, if available. -Obtain labs today as outlined below. -Return to our clinic in 1 week for review

## 2017-05-05 ENCOUNTER — Ambulatory Visit (HOSPITAL_COMMUNITY): Payer: BLUE CROSS/BLUE SHIELD | Admitting: Psychiatry

## 2017-05-05 ENCOUNTER — Encounter (HOSPITAL_COMMUNITY): Payer: Self-pay | Admitting: Psychiatry

## 2017-05-05 DIAGNOSIS — Z818 Family history of other mental and behavioral disorders: Secondary | ICD-10-CM

## 2017-05-05 DIAGNOSIS — F1211 Cannabis abuse, in remission: Secondary | ICD-10-CM | POA: Diagnosis not present

## 2017-05-05 DIAGNOSIS — Z811 Family history of alcohol abuse and dependence: Secondary | ICD-10-CM

## 2017-05-05 DIAGNOSIS — F5105 Insomnia due to other mental disorder: Secondary | ICD-10-CM | POA: Diagnosis not present

## 2017-05-05 DIAGNOSIS — F319 Bipolar disorder, unspecified: Secondary | ICD-10-CM

## 2017-05-05 DIAGNOSIS — F99 Mental disorder, not otherwise specified: Secondary | ICD-10-CM

## 2017-05-05 DIAGNOSIS — F17210 Nicotine dependence, cigarettes, uncomplicated: Secondary | ICD-10-CM | POA: Diagnosis not present

## 2017-05-05 DIAGNOSIS — F411 Generalized anxiety disorder: Secondary | ICD-10-CM | POA: Diagnosis not present

## 2017-05-05 MED ORDER — LITHIUM CARBONATE 300 MG PO CAPS: 600.0000 mg | ORAL_CAPSULE | Freq: Two times a day (BID) | ORAL | 3 refills | Status: DC

## 2017-05-05 MED ORDER — HYDROXYZINE PAMOATE 25 MG PO CAPS: 25.0000 mg | ORAL_CAPSULE | Freq: Two times a day (BID) | ORAL | 3 refills | Status: DC | PRN

## 2017-05-05 MED ORDER — BUSPIRONE HCL 10 MG PO TABS: ORAL_TABLET | ORAL | 3 refills | Status: DC

## 2017-05-05 MED ORDER — TRAZODONE HCL 150 MG PO TABS: 375.0000 mg | ORAL_TABLET | Freq: Every day | ORAL | 3 refills | Status: DC

## 2017-05-05 NOTE — Progress Notes (Signed)
[facility] MD/PA/NP OP Progress Note  05/05/2017 10:36 AM Valerie Reilly  MRN:  [id number]  Chief Complaint:  Chief Complaint    Manic Behavior; Follow-up     HPI: "i'm hanging in there". Pt's boyfriend of 33 yrs died earlier this month. It was sudden. "I am ok". Pt took time after work and just went back yesterday. She is grieving his loss and reports she cried for the first week. She has been talking with friends. In the past death of loved ones have served as a trigger which caused noncompliance and manic episodes that lead to hospitalizations.   Pt denies depression and hopelessness. Sleep is good with Trazodone. Pt denies SI/HI.  Pt denies recent manic and hypomanic symptoms including periods of decreased need for sleep, increased energy, mood lability, impulsivity, FOI, and excessive spending.  Pt is having some anxiety but is mild.  Pt states-taking meds as prescribed and denies SE.   Visit Diagnosis:    ICD-10-CM   1. Bipolar I disorder (HCC) F31.9 busPIRone (BUSPAR) 10 MG tablet    traZODone (DESYREL) 150 MG tablet    lithium carbonate 300 MG capsule  2. Anxiety state F41.1 hydrOXYzine (VISTARIL) 25 MG capsule  3. Insomnia due to other mental disorder F51.05 traZODone (DESYREL) 150 MG tablet   F99       Past Psychiatric History:  Diagnosis: Bipolar, Cannabis abuse, Borderline PD  Hospitalizations: multiple- last time was in 2014 at [facility] for mania  Outpatient Care: [facility] in [place], can't recall other psychiatrists. Has been in group and individual therapy in the past  Substance Abuse Care: drug class in 2014 for [facility]  Self-Mutilation: denies  Suicidal Attempts: yes- many years ago. Has hx of 2-3 previous attempts  Violent Behaviors: denies but later stated maybe when psychotic or manic?    Past Medical History:  Past Medical History:  Diagnosis Date  . Bipolar disorder ([place])   . Diabetes mellitus   . Diabetes mellitus, type II ([place])   . GERD (gastroesophageal  reflux disease)   . History of borderline personality disorder   . Hypercholesteremia   . Hypertension   . Psoriasis   . Seasonal allergies   . Thyroid disease     Past Surgical History:  Procedure Laterality Date  . TONSILLECTOMY      Family Psychiatric History: Family History  Problem Relation Age of Onset  . Personality disorder Mother   . Alcohol abuse Maternal Uncle   . Depression Maternal Uncle     Social History:  Social History   Socioeconomic History  . Marital status: Divorced    Spouse name: None  . Number of children: None  . Years of education: None  . Highest education level: None  Social Needs  . Financial resource strain: None  . Food insecurity - worry: None  . Food insecurity - inability: None  . Transportation needs - medical: None  . Transportation needs - non-medical: None  Occupational History  . None  Tobacco Use  . Smoking status: Current Every Day Smoker    Packs/day: 1.00    Types: Cigarettes, E-cigarettes  . Smokeless tobacco: Never Used  Substance and Sexual Activity  . Alcohol use: Yes    Alcohol/week: 0.0 oz    Comment: occasional- a few times a year. 6-7 drinks per episdoe  . Drug use: No    Comment: last time was 3 yrs ago  . Sexual activity: None  Other Topics Concern  . None  Social History Narrative  .  None    Allergies: No Known Allergies  Metabolic Disorder Labs: No results found for: HGBA1C, MPG No results found for: PROLACTIN No results found for: CHOL, TRIG, HDL, CHOLHDL, VLDL, LDLCALC No results found for: TSH  Therapeutic Level Labs: No results found for: LITHIUM No results found for: VALPROATE No components found for:  CBMZ  Current Medications: Current Outpatient Medications  Medication Sig Dispense Refill  . albuterol (PROVENTIL HFA;VENTOLIN HFA) 108 (90 BASE) MCG/ACT inhaler Inhale into the lungs every 6 (six) hours as needed for wheezing or shortness of breath.    . benazepril (LOTENSIN) 20 MG  tablet Take 20 mg by mouth daily.    . busPIRone (BUSPAR) 10 MG tablet Three tablets twice a day 180 tablet 3  . cetirizine (ZYRTEC) 10 MG tablet Take 10 mg by mouth daily.    . cholecalciferol (VITAMIN D) 1000 UNITS tablet Take 1,000 Units by mouth daily.    Marland Kitchen glipiZIDE (GLUCOTROL XL) 10 MG 24 hr tablet Take 10 mg by mouth daily.    . hydrOXYzine (VISTARIL) 25 MG capsule Take 1 capsule (25 mg total) by mouth 2 (two) times daily as needed for anxiety. 60 capsule 3  . levothyroxine (SYNTHROID, LEVOTHROID) 50 MCG tablet Take 50 mcg by mouth daily before breakfast.    . lithium carbonate 300 MG capsule Take 2 capsules (600 mg total) by mouth 2 (two) times daily with a meal. 120 capsule 3  . medroxyPROGESTERone (DEPO-PROVERA) 150 MG/ML injection Inject 150 mg into the muscle every 3 (three) months.    . metFORMIN (GLUCOPHAGE) 1000 MG tablet Take 750 mg by mouth 2 (two) times daily with a meal.     . montelukast (SINGULAIR) 10 MG tablet Take 10 mg by mouth at bedtime.    . Multiple Vitamin (MULTIVITAMIN) capsule Take 1 capsule by mouth daily.    . ranitidine (ZANTAC) 150 MG capsule Take 150 mg by mouth 2 (two) times daily.    . simvastatin (ZOCOR) 20 MG tablet Take 40 mg by mouth daily.     . traZODone (DESYREL) 150 MG tablet Take 2.5 tablets (375 mg total) by mouth at bedtime. 2 and 1/2 tablets 75 tablet 3   No current facility-administered medications for this visit.      Musculoskeletal: Strength & Muscle Tone: within normal limits Gait & Station: normal Patient leans: N/A  Psychiatric Specialty Exam: Review of Systems  Constitutional: Negative for chills, fever and malaise/fatigue.  HENT: Negative for congestion, ear pain, nosebleeds, sinus pain and sore throat.   Neurological: Negative for weakness.    Blood pressure 132/74, pulse 88, height 5\' 6"  (1.676 m), weight 251 lb (113.9 kg).Body mass index is 40.51 kg/m.  General Appearance: Casual  Eye Contact:  Good  Speech:  Clear and  Coherent and Normal Rate  Volume:  Normal  Mood:  Euthymic  Affect:  Blunt  Thought Process:  Goal Directed and Descriptions of Associations: Intact  Orientation:  Full (Time, Place, and Person)  Thought Content: Logical   Suicidal Thoughts:  No  Homicidal Thoughts:  No  Memory:  Immediate;   Good Recent;   Good Remote;   Good  Judgement:  Good  Insight:  Good  Psychomotor Activity:  Normal  Concentration:  Concentration: Good and Attention Span: Good  Recall:  Good  Fund of Knowledge: Good  Language: Good  Akathisia:  No  Handed:  Right  AIMS (if indicated): not done  Assets:  Communication Skills Desire for Improvement Housing Social  Support Transportation Vocational/Educational  ADL's:  Intact  Cognition: WNL  Sleep:  Good   Screenings:   Assessment and Plan: Bipolar 1 disorder, anxiety, insomnia, cannabis abuse in remission    Medication management with supportive therapy. Risks and benefits, side effects and alternative treatment options discussed with patient. Pt was given an opportunity to ask questions about medication, illness, and treatment. All current psychiatric medications have been reviewed and discussed with the patient and adjusted as clinically appropriate. The patient has been provided an accurate and updated list of the medications being now prescribed. Patient expressed understanding of how their medications were to be used.  Pt verbalized understanding and verbal consent obtained for treatment.   Status of current problems: stable  Meds: Lithium 600 mg p.o. twice daily for bipolar disorder BuSpar 30 mg p.o. twice daily as adjunct mood stabilizer and treatment for anxiety Trazodone 375 mg p.o. nightly for insomnia Vistaril 25 mg p.o. twice daily as needed anxiety   Labs: Reviewed labs done on 04/06/2017 which show CMP within normal limits, LDH of 93, CBC shows mildly elevated WBC but otherwise within normal limits  Therapy: brief supportive  therapy provided. Discussed psychosocial stressors in detail.    Consultations: declined therapy and groups   Pt denies SI and is at an acute low risk for suicide. Patient told to call clinic if any problems occur. Patient advised to go to ER if they should develop SI/HI, side effects, or if symptoms worsen. Has crisis numbers to call if needed. Pt verbalized understanding.  F/up in 1 months or sooner if needed    Charlcie Cradle, MD 05/05/2017, 10:36 AM

## 2017-06-23 ENCOUNTER — Ambulatory Visit (HOSPITAL_COMMUNITY): Payer: Self-pay | Admitting: Psychiatry

## 2017-07-07 ENCOUNTER — Encounter (HOSPITAL_COMMUNITY): Payer: Self-pay | Admitting: Psychiatry

## 2017-07-07 ENCOUNTER — Ambulatory Visit (HOSPITAL_COMMUNITY): Payer: BLUE CROSS/BLUE SHIELD | Admitting: Psychiatry

## 2017-07-07 ENCOUNTER — Other Ambulatory Visit: Payer: Self-pay

## 2017-07-07 DIAGNOSIS — Z598 Other problems related to housing and economic circumstances: Secondary | ICD-10-CM

## 2017-07-07 DIAGNOSIS — F319 Bipolar disorder, unspecified: Secondary | ICD-10-CM | POA: Diagnosis not present

## 2017-07-07 DIAGNOSIS — F99 Mental disorder, not otherwise specified: Secondary | ICD-10-CM

## 2017-07-07 DIAGNOSIS — F411 Generalized anxiety disorder: Secondary | ICD-10-CM | POA: Diagnosis not present

## 2017-07-07 DIAGNOSIS — F1211 Cannabis abuse, in remission: Secondary | ICD-10-CM | POA: Diagnosis not present

## 2017-07-07 DIAGNOSIS — Z818 Family history of other mental and behavioral disorders: Secondary | ICD-10-CM

## 2017-07-07 DIAGNOSIS — F5105 Insomnia due to other mental disorder: Secondary | ICD-10-CM | POA: Diagnosis not present

## 2017-07-07 DIAGNOSIS — F17210 Nicotine dependence, cigarettes, uncomplicated: Secondary | ICD-10-CM | POA: Diagnosis not present

## 2017-07-07 DIAGNOSIS — Z915 Personal history of self-harm: Secondary | ICD-10-CM | POA: Diagnosis not present

## 2017-07-07 DIAGNOSIS — Z811 Family history of alcohol abuse and dependence: Secondary | ICD-10-CM

## 2017-07-07 MED ORDER — HYDROXYZINE PAMOATE 25 MG PO CAPS: 25.0000 mg | ORAL_CAPSULE | Freq: Two times a day (BID) | ORAL | 0 refills | Status: DC | PRN

## 2017-07-07 MED ORDER — LITHIUM CARBONATE 300 MG PO CAPS: 600.0000 mg | ORAL_CAPSULE | Freq: Two times a day (BID) | ORAL | 0 refills | Status: DC

## 2017-07-07 MED ORDER — TRAZODONE HCL 150 MG PO TABS: 375.0000 mg | ORAL_TABLET | Freq: Every day | ORAL | 0 refills | Status: DC

## 2017-07-07 MED ORDER — BUSPIRONE HCL 10 MG PO TABS: ORAL_TABLET | ORAL | 0 refills | Status: DC

## 2017-07-07 NOTE — Progress Notes (Signed)
BH MD/PA/NP OP Progress Note  07/07/2017 9:53 AM Valerie Reilly  MRN:  [id number]  Chief Complaint:  Chief Complaint    Follow-up     HPI: her boyfriend passed away on 2022/05/08. Pt was really sad at the beginning and cried a lot but now feels ok. She worries the grief will come on suddenly. She could not pay her bills so moved in with her daughter on Friday. It is new and challenging. She denies depression and feeling "numb". Pt has been spending time with friends and states she likes to be home. She is going to apply for a new job on Saturday when her Internet is installed. Sleep is good and she is getting about 8 hrs lately. Pt denies hopelessness and crying spells. Pt denies SI/HI.  Pt denies recent manic and hypomanic symptoms including periods of decreased need for sleep, increased energy, mood lability, impulsivity, FOI, and excessive spending.  Pt denies anxiety.   Pt states-taking meds as prescribed and denies SE.   Visit Diagnosis:    ICD-10-CM   1. Bipolar I disorder (HCC) F31.9 busPIRone (BUSPAR) 10 MG tablet    traZODone (DESYREL) 150 MG tablet    lithium carbonate 300 MG capsule  2. Anxiety state F41.1 hydrOXYzine (VISTARIL) 25 MG capsule  3. Insomnia due to other mental disorder F51.05 traZODone (DESYREL) 150 MG tablet   F99       Past Psychiatric History:  Diagnosis: Bipolar, Cannabis abuse, Borderline PD  Hospitalizations: multiple- last time was in 2014 at [facility] for mania  Outpatient Care: [facility] in [place], can't recall other psychiatrists. Has been in group and individual therapy in the past  Substance Abuse Care: drug class in 2014 for [facility]  Self-Mutilation: denies  Suicidal Attempts: yes- many years ago. Has hx of 2-3 previous attempts  Violent Behaviors: denies but later stated maybe when psychotic or manic?    Past Medical History:  Past Medical History:  Diagnosis Date  . Bipolar disorder ([place])   . Diabetes mellitus   . Diabetes mellitus, type II  ([place])   . GERD (gastroesophageal reflux disease)   . History of borderline personality disorder   . Hypercholesteremia   . Hypertension   . Psoriasis   . Seasonal allergies   . Thyroid disease     Past Surgical History:  Procedure Laterality Date  . TONSILLECTOMY      Family Psychiatric History:  Family History  Problem Relation Age of Onset  . Personality disorder Mother   . Alcohol abuse Maternal Uncle   . Depression Maternal Uncle     Social History:  Social History   Socioeconomic History  . Marital status: Divorced    Spouse name: Not on file  . Number of children: Not on file  . Years of education: Not on file  . Highest education level: Not on file  Occupational History  . Not on file  Social Needs  . Financial resource strain: Not on file  . Food insecurity:    Worry: Not on file    Inability: Not on file  . Transportation needs:    Medical: Not on file    Non-medical: Not on file  Tobacco Use  . Smoking status: Current Every Day Smoker    Packs/day: 1.00    Types: Cigarettes, E-cigarettes  . Smokeless tobacco: Never Used  Substance and Sexual Activity  . Alcohol use: Yes    Alcohol/week: 0.0 oz    Comment: occasional- a few times a year.  6-7 drinks per episdoe  . Drug use: No    Types: Marijuana    Comment: last time was 3 yrs ago  . Sexual activity: Not on file  Lifestyle  . Physical activity:    Days per week: Not on file    Minutes per session: Not on file  . Stress: Not on file  Relationships  . Social connections:    Talks on phone: Not on file    Gets together: Not on file    Attends religious service: Not on file    Active member of club or organization: Not on file    Attends meetings of clubs or organizations: Not on file    Relationship status: Not on file  Other Topics Concern  . Not on file  Social History Narrative  . Not on file    Allergies: No Known Allergies  Metabolic Disorder Labs: No results found for: HGBA1C,  MPG No results found for: PROLACTIN No results found for: CHOL, TRIG, HDL, CHOLHDL, VLDL, LDLCALC No results found for: TSH  Therapeutic Level Labs: No results found for: LITHIUM No results found for: VALPROATE No components found for:  CBMZ  Current Medications: Current Outpatient Medications  Medication Sig Dispense Refill  . albuterol (PROVENTIL HFA;VENTOLIN HFA) 108 (90 BASE) MCG/ACT inhaler Inhale into the lungs every 6 (six) hours as needed for wheezing or shortness of breath.    . benazepril (LOTENSIN) 20 MG tablet Take 20 mg by mouth daily.    . busPIRone (BUSPAR) 10 MG tablet Three tablets twice a day 180 tablet 0  . cetirizine (ZYRTEC) 10 MG tablet Take 10 mg by mouth daily.    . cholecalciferol (VITAMIN D) 1000 UNITS tablet Take 1,000 Units by mouth daily.    . fluticasone (FLONASE) 50 MCG/ACT nasal spray Place 2 sprays into both nostrils daily.    Marland Kitchen glipiZIDE (GLUCOTROL XL) 10 MG 24 hr tablet Take 10 mg by mouth daily.    . Guselkumab (TREMFYA) 100 MG/ML SOPN Inject 100 mg into the skin every 8 (eight) weeks.    . hydrOXYzine (VISTARIL) 25 MG capsule Take 1 capsule (25 mg total) by mouth 2 (two) times daily as needed for anxiety. 60 capsule 0  . levothyroxine (SYNTHROID, LEVOTHROID) 50 MCG tablet Take 50 mcg by mouth daily before breakfast.    . lithium carbonate 300 MG capsule Take 2 capsules (600 mg total) by mouth 2 (two) times daily with a meal. 120 capsule 0  . medroxyPROGESTERone (DEPO-PROVERA) 150 MG/ML injection Inject 150 mg into the muscle every 3 (three) months.    . metFORMIN (GLUCOPHAGE) 1000 MG tablet Take 750 mg by mouth daily with breakfast.     . montelukast (SINGULAIR) 10 MG tablet Take 10 mg by mouth at bedtime.    . Multiple Vitamin (MULTIVITAMIN) capsule Take 1 capsule by mouth daily.    . ranitidine (ZANTAC) 150 MG capsule Take 150 mg by mouth 2 (two) times daily.    . simvastatin (ZOCOR) 20 MG tablet Take 40 mg by mouth daily.     . traZODone (DESYREL)  150 MG tablet Take 2.5 tablets (375 mg total) by mouth at bedtime. 2 and 1/2 tablets 75 tablet 0   No current facility-administered medications for this visit.      Musculoskeletal: Strength & Muscle Tone: within normal limits Gait & Station: normal Patient leans: N/A  Psychiatric Specialty Exam: Review of Systems  Constitutional: Negative for chills, diaphoresis and fever.  HENT: Negative for congestion,  nosebleeds and sore throat.     Blood pressure 131/83, pulse 81, temperature 98.3 F (36.8 C), temperature source Oral, resp. rate 16, weight 245 lb (111.1 kg), SpO2 97 %.Body mass index is 39.54 kg/m.  General Appearance: Casual  Eye Contact:  Good  Speech:  Clear and Coherent and Normal Rate  Volume:  Normal  Mood:  Euthymic  Affect:  Blunt  Thought Process:  Coherent and Descriptions of Associations: Intact  Orientation:  Full (Time, Place, and Person)  Thought Content: Logical   Suicidal Thoughts:  No  Homicidal Thoughts:  No  Memory:  Immediate;   Good Recent;   Good Remote;   Good  Judgement:  Good  Insight:  Good  Psychomotor Activity:  Normal  Concentration:  Concentration: Good and Attention Span: Good  Recall:  Good  Fund of Knowledge: Good  Language: Good  Akathisia:  No  Handed:  Right  AIMS (if indicated): not done  Assets:  Communication Skills Desire for Improvement Housing Transportation Vocational/Educational  ADL's:  Intact  Cognition: WNL  Sleep:  Good   Screenings:  I reviewed the information below on 07/07/2017 and agree except where noted/changed Assessment and Plan: Bipolar 1 disorder, anxiety, insomnia, cannabis abuse in remission      Medication management with supportive therapy. Risks and benefits, side effects and alternative treatment options discussed with patient. Pt was given an opportunity to ask questions about medication, illness, and treatment. All current psychiatric medications have been reviewed and discussed with the  patient and adjusted as clinically appropriate. The patient has been provided an accurate and updated list of the medications being now prescribed. Patient expressed understanding of how their medications were to be used.  Pt verbalized understanding and verbal consent obtained for treatment.     Status of current problems: stable   Meds: Lithium 600 mg p.o. twice daily for bipolar disorder BuSpar 30 mg p.o. twice daily as adjunct mood stabilizer and treatment for anxiety Trazodone 375 mg p.o. nightly for insomnia Vistaril 25 mg p.o. twice daily as needed anxiety     Labs: none   Therapy: brief supportive therapy provided. Discussed psychosocial stressors in detail.     Consultations: declined therapy and groups   Pt denies SI and is at an acute low risk for suicide. Patient told to call clinic if any problems occur. Patient advised to go to ER if they should develop SI/HI, side effects, or if symptoms worsen. Has crisis numbers to call if needed. Pt verbalized understanding.   F/up in 1 months or sooner if needed    Charlcie Cradle, MD 07/07/2017, 9:53 AM

## 2017-07-13 ENCOUNTER — Other Ambulatory Visit: Payer: Self-pay | Admitting: Family Medicine

## 2017-07-13 DIAGNOSIS — Z1231 Encounter for screening mammogram for malignant neoplasm of breast: Secondary | ICD-10-CM

## 2017-08-04 ENCOUNTER — Encounter (HOSPITAL_COMMUNITY): Payer: Self-pay | Admitting: Psychiatry

## 2017-08-04 ENCOUNTER — Ambulatory Visit (HOSPITAL_COMMUNITY): Payer: BLUE CROSS/BLUE SHIELD | Admitting: Psychiatry

## 2017-08-04 VITALS — BP 107/72 | HR 78 | Ht 66.0 in | Wt 243.8 lb

## 2017-08-04 DIAGNOSIS — Z811 Family history of alcohol abuse and dependence: Secondary | ICD-10-CM

## 2017-08-04 DIAGNOSIS — F17210 Nicotine dependence, cigarettes, uncomplicated: Secondary | ICD-10-CM

## 2017-08-04 DIAGNOSIS — Z818 Family history of other mental and behavioral disorders: Secondary | ICD-10-CM

## 2017-08-04 DIAGNOSIS — F1211 Cannabis abuse, in remission: Secondary | ICD-10-CM

## 2017-08-04 DIAGNOSIS — F99 Mental disorder, not otherwise specified: Secondary | ICD-10-CM

## 2017-08-04 DIAGNOSIS — F411 Generalized anxiety disorder: Secondary | ICD-10-CM | POA: Diagnosis not present

## 2017-08-04 DIAGNOSIS — F5105 Insomnia due to other mental disorder: Secondary | ICD-10-CM | POA: Diagnosis not present

## 2017-08-04 DIAGNOSIS — F319 Bipolar disorder, unspecified: Secondary | ICD-10-CM

## 2017-08-04 MED ORDER — TRAZODONE HCL 150 MG PO TABS: 375.0000 mg | ORAL_TABLET | Freq: Every day | ORAL | 2 refills | Status: DC

## 2017-08-04 MED ORDER — HYDROXYZINE PAMOATE 25 MG PO CAPS: 25.0000 mg | ORAL_CAPSULE | Freq: Two times a day (BID) | ORAL | 2 refills | Status: DC | PRN

## 2017-08-04 MED ORDER — BUSPIRONE HCL 10 MG PO TABS: ORAL_TABLET | ORAL | 2 refills | Status: DC

## 2017-08-04 MED ORDER — LITHIUM CARBONATE 300 MG PO CAPS: 600.0000 mg | ORAL_CAPSULE | Freq: Two times a day (BID) | ORAL | 2 refills | Status: DC

## 2017-08-04 NOTE — Progress Notes (Signed)
[facility] MD/PA/NP OP Progress Note  08/04/2017 9:49 AM Tokiko Diefenderfer  MRN:  [id number]  Chief Complaint:  Chief Complaint    Follow-up     HPI: Patient states that she is doing well.  She states over the last 5-6 years she is lost 4 people were very close to her and wonders if she should look into some grief support groups.  I gave her the information for the hospice and palliative care of [facility] support group information.  Patient reports that she misses her boyfriend a lot on the weekends when she will is alone but overall is dealing with his passing well.  She states her depression has not changed much and it is manageable.  She is going to work and is going out with friends and enjoys herself when she does so.  Her only big stressor right now is dealing with her daughter.  They have moved in together due to financial issues.  She states that her daughter irritates her a lot.  Sleep is fair.  She denies any manic or hypomanic-like symptoms.  Anxiety is mild she is taking her BuSpar and Vistaril twice a day.  She is taking her medications as prescribed and denies side effects  Visit Diagnosis:    ICD-10-CM   1. GAD (generalized anxiety disorder) F41.1 busPIRone (BUSPAR) 10 MG tablet    hydrOXYzine (VISTARIL) 25 MG capsule  2. Bipolar I disorder (HCC) F31.9 busPIRone (BUSPAR) 10 MG tablet    lithium carbonate 300 MG capsule    traZODone (DESYREL) 150 MG tablet  3. Insomnia due to other mental disorder F51.05 traZODone (DESYREL) 150 MG tablet   F99     Past Psychiatric History:  Diagnosis: Bipolar, Cannabis abuse, Borderline PD Hospitalizations: multiple- last time was in 2014 at [facility] for mania Outpatient Care: [facility] in [place], can't recall other psychiatrists. Has been in group and individual therapy in the past Substance Abuse Care: drug class in 2014 for [facility] Self-Mutilation: denies Suicidal Attempts: yes- many years ago. Has hx of 2-3 previous attempts Violent Behaviors:  denies but later stated maybe when psychotic or manic?    Past Medical History:  Past Medical History:  Diagnosis Date  . Bipolar disorder ([place])   . Diabetes mellitus   . Diabetes mellitus, type II ([place])   . GERD (gastroesophageal reflux disease)   . History of borderline personality disorder   . Hypercholesteremia   . Hypertension   . Psoriasis   . Seasonal allergies   . Thyroid disease     Past Surgical History:  Procedure Laterality Date  . TONSILLECTOMY      Family Psychiatric History:  Family History  Problem Relation Age of Onset  . Personality disorder Mother   . Alcohol abuse Maternal Uncle   . Depression Maternal Uncle     Social History:  Social History   Socioeconomic History  . Marital status: Divorced    Spouse name: Not on file  . Number of children: Not on file  . Years of education: Not on file  . Highest education level: Not on file  Occupational History  . Not on file  Social Needs  . Financial resource strain: Not on file  . Food insecurity:    Worry: Not on file    Inability: Not on file  . Transportation needs:    Medical: Not on file    Non-medical: Not on file  Tobacco Use  . Smoking status: Current Every Day Smoker    Packs/day:  1.00    Types: Cigarettes, E-cigarettes  . Smokeless tobacco: Never Used  Substance and Sexual Activity  . Alcohol use: Yes    Alcohol/week: 0.0 oz    Comment: occasional- a few times a year. 6-7 drinks per episdoe  . Drug use: No    Types: Marijuana    Comment: last time was 3 yrs ago  . Sexual activity: Not on file  Lifestyle  . Physical activity:    Days per week: Not on file    Minutes per session: Not on file  . Stress: Not on file  Relationships  . Social connections:    Talks on phone: Not on file    Gets together: Not on file    Attends religious service: Not on file    Active member of club or organization: Not on file    Attends meetings of clubs or organizations: Not on file     Relationship status: Not on file  Other Topics Concern  . Not on file  Social History Narrative  . Not on file    Allergies: No Known Allergies  Metabolic Disorder Labs: No results found for: HGBA1C, MPG No results found for: PROLACTIN No results found for: CHOL, TRIG, HDL, CHOLHDL, VLDL, LDLCALC No results found for: TSH  Therapeutic Level Labs: No results found for: LITHIUM No results found for: VALPROATE No components found for:  CBMZ  Current Medications: Current Outpatient Medications  Medication Sig Dispense Refill  . albuterol (PROVENTIL HFA;VENTOLIN HFA) 108 (90 BASE) MCG/ACT inhaler Inhale into the lungs every 6 (six) hours as needed for wheezing or shortness of breath.    . benazepril (LOTENSIN) 20 MG tablet Take 20 mg by mouth daily.    . cetirizine (ZYRTEC) 10 MG tablet Take 10 mg by mouth daily.    . cholecalciferol (VITAMIN D) 1000 UNITS tablet Take 1,000 Units by mouth daily.    . fluticasone (FLONASE) 50 MCG/ACT nasal spray Place 2 sprays into both nostrils daily.    Marland Kitchen glipiZIDE (GLUCOTROL XL) 10 MG 24 hr tablet Take 10 mg by mouth daily.    . Guselkumab (TREMFYA) 100 MG/ML SOPN Inject 100 mg into the skin every 8 (eight) weeks.    Marland Kitchen levothyroxine (SYNTHROID, LEVOTHROID) 50 MCG tablet Take 50 mcg by mouth daily before breakfast.    . medroxyPROGESTERone (DEPO-PROVERA) 150 MG/ML injection Inject 150 mg into the muscle every 3 (three) months.    . metFORMIN (GLUCOPHAGE) 1000 MG tablet Take 750 mg by mouth daily with breakfast.     . montelukast (SINGULAIR) 10 MG tablet Take 10 mg by mouth at bedtime.    . Multiple Vitamin (MULTIVITAMIN) capsule Take 1 capsule by mouth daily.    . ranitidine (ZANTAC) 150 MG capsule Take 150 mg by mouth 2 (two) times daily.    . simvastatin (ZOCOR) 20 MG tablet Take 40 mg by mouth daily.     . busPIRone (BUSPAR) 10 MG tablet Three tablets twice a day 180 tablet 2  . hydrOXYzine (VISTARIL) 25 MG capsule Take 1 capsule (25 mg total) by  mouth 2 (two) times daily as needed for anxiety. 60 capsule 2  . lithium carbonate 300 MG capsule Take 2 capsules (600 mg total) by mouth 2 (two) times daily with a meal. 120 capsule 2  . traZODone (DESYREL) 150 MG tablet Take 2.5 tablets (375 mg total) by mouth at bedtime. 2 and 1/2 tablets 75 tablet 2   No current facility-administered medications for this visit.  Musculoskeletal: Strength & Muscle Tone: within normal limits Gait & Station: normal Patient leans: N/A  Psychiatric Specialty Exam: Review of Systems  Constitutional: Negative for chills, diaphoresis and fever.  Psychiatric/Behavioral: Positive for depression. Negative for hallucinations, substance abuse and suicidal ideas. The patient is not nervous/anxious and does not have insomnia.     Blood pressure 107/72, pulse 78, height 5\' 6"  (1.676 m), weight 243 lb 12.8 oz (110.6 kg), SpO2 98 %.Body mass index is 39.35 kg/m.  General Appearance: Casual  Eye Contact:  Good  Speech:  Clear and Coherent and Normal Rate  Volume:  Normal  Mood:  Depressed  Affect:  Blunt  Thought Process:  Goal Directed and Descriptions of Associations: Intact  Orientation:  Full (Time, Place, and Person)  Thought Content: Logical   Suicidal Thoughts:  No  Homicidal Thoughts:  No  Memory:  Immediate;   Good Recent;   Good Remote;   Good  Judgement:  Good  Insight:  Good  Psychomotor Activity:  Normal  Concentration:  Concentration: Good and Attention Span: Good  Recall:  Good  Fund of Knowledge: Good  Language: Good  Akathisia:  No  Handed:  Right  AIMS (if indicated): not done  Assets:  Communication Skills Desire for Improvement Housing Social Support Transportation Vocational/Educational  ADL's:  Intact  Cognition: WNL  Sleep:  Good   Screenings:   Assessment and Plan: Bipolar 1 disorder;  GAD Insomnia Cannabis abuse in remission    Medication management with supportive therapy. Risks and benefits, side effects  and alternative treatment options discussed with patient. Pt was given an opportunity to ask questions about medication, illness, and treatment. All current psychiatric medications have been reviewed and discussed with the patient and adjusted as clinically appropriate. The patient has been provided an accurate and updated list of the medications being now prescribed. Patient expressed understanding of how their medications were to be used.  Pt verbalized understanding and verbal consent obtained for treatment.  The risk of un-intended pregnancy is moderate based on the fact that pt reports she is premenopausal. Pt is aware that these meds carry a teratogenic risk. Pt will discuss plan of action if she does or plans to become pregnant in the future.  Status of current problems: stable  Meds: Lithium 600 mg p.o. twice daily for bipolar disorder BuSpar 30 mg p.o. twice daily as adjunct treatment for mood and treatment of anxiety Trazodone 375 mg p.o. nightly for insomnia Vistaril 25 mg p.o. twice daily as needed anxiety   Labs: pt will get Lithium level, CMP with Bun/Creatinine, TSH done by PCP    Therapy: brief supportive therapy provided. Discussed psychosocial stressors in detail.     Consultations: [facility] Encouraged to follow up with PCP as needed  Pt denies SI and is at an acute low risk for suicide. Patient told to call clinic if any problems occur. Patient advised to go to ER if they should develop SI/HI, side effects, or if symptoms worsen. Has crisis numbers to call if needed. Pt verbalized understanding.  F/up in 3 months or sooner if needed  The duration of this appointment visit was 20 minutes of face-to-face time with the patient.  Greater than 50% of this time was spent in counseling, explanation of  diagnosis, planning of further management, and coordination of care      Charlcie Cradle, MD 08/04/2017, 9:49 AM

## 2017-08-12 ENCOUNTER — Ambulatory Visit
Admission: RE | Admit: 2017-08-12 | Discharge: 2017-08-12 | Disposition: A | Discharge status: Home / Self Care | Payer: BLUE CROSS/BLUE SHIELD | Source: Ambulatory Visit | Attending: Family Medicine | Admitting: Family Medicine

## 2017-08-12 DIAGNOSIS — Z1231 Encounter for screening mammogram for malignant neoplasm of breast: Secondary | ICD-10-CM

## 2017-09-22 ENCOUNTER — Ambulatory Visit (HOSPITAL_COMMUNITY): Payer: BLUE CROSS/BLUE SHIELD | Admitting: Psychiatry

## 2017-09-22 VITALS — BP 135/86 | HR 86 | Ht 66.5 in | Wt 244.0 lb

## 2017-09-22 DIAGNOSIS — F5105 Insomnia due to other mental disorder: Secondary | ICD-10-CM

## 2017-09-22 DIAGNOSIS — F411 Generalized anxiety disorder: Secondary | ICD-10-CM

## 2017-09-22 DIAGNOSIS — F319 Bipolar disorder, unspecified: Secondary | ICD-10-CM | POA: Diagnosis not present

## 2017-09-22 DIAGNOSIS — F99 Mental disorder, not otherwise specified: Secondary | ICD-10-CM | POA: Diagnosis not present

## 2017-09-22 MED ORDER — CLONAZEPAM 1 MG PO TABS: 1.0000 mg | ORAL_TABLET | Freq: Every day | ORAL | 0 refills | Status: DC | PRN

## 2017-09-22 MED ORDER — LITHIUM CARBONATE 300 MG PO CAPS: 600.0000 mg | ORAL_CAPSULE | Freq: Two times a day (BID) | ORAL | 2 refills | Status: DC

## 2017-09-22 MED ORDER — TRAZODONE HCL 150 MG PO TABS: 375.0000 mg | ORAL_TABLET | Freq: Every day | ORAL | 2 refills | Status: DC

## 2017-09-22 MED ORDER — BUSPIRONE HCL 10 MG PO TABS: ORAL_TABLET | ORAL | 2 refills | Status: DC

## 2017-09-22 NOTE — Progress Notes (Signed)
BH MD/PA/NP OP Progress Note  09/22/2017 1:44 PM Valerie Reilly  MRN:  [id number]  Chief Complaint:  Chief Complaint    Follow-up     HPI: Pt reports she set up an emergency appt due to feeling sad and missing her boyfriend.   Pt stopped the Vistaril a few days ago at the recommendation of a pharmacist when pt complained of daytime sedation. Vistaril was calming her down but she did fall asleep a few times a work.  Her anxiety has been high. Pt is feeling "edgy and irritable". She is worried about her boyfriend's birthday last week. He passed so she spent being busy and doing things she liked. It is rough because this is first year after his passing.   In the back of her mind she thinks about him alot. Sleep is good. Pt called out of work on Monday otherwise she has been going to work as usual. She has been going to a grief support group and it helps a little. Monday was a bad day where she was missing her boyfriend and feeling down and crying a lot. She denies feeling really depressed. Pt denies SI/HI. Pt does not think she needs inpt psych and does not think it was helpful in the past.  Pt denies recent manic and hypomanic symptoms including periods of decreased need for sleep, increased energy, mood lability, impulsivity, FOI, and excessive spending.   Visit Diagnosis:    ICD-10-CM   1. GAD (generalized anxiety disorder) F41.1 busPIRone (BUSPAR) 10 MG tablet    clonazePAM (KLONOPIN) 1 MG tablet  2. Bipolar I disorder (HCC) F31.9 busPIRone (BUSPAR) 10 MG tablet    lithium carbonate 300 MG capsule    traZODone (DESYREL) 150 MG tablet  3. Insomnia due to other mental disorder F51.05 traZODone (DESYREL) 150 MG tablet   F99       Past Psychiatric History:  Diagnosis: Bipolar, Cannabis abuse, Borderline PD  Hospitalizations: multiple- last time was in 2014 at [facility] for mania  Outpatient Care: [facility] in [place], can't recall other psychiatrists. Has been in group and individual  therapy in the past  Substance Abuse Care: drug class in 2014 for [facility]  Self-Mutilation: denies  Suicidal Attempts: yes- many years ago. Has hx of 2-3 previous attempts  Violent Behaviors: denies but later stated maybe when psychotic or manic?   Past Medical History:  Past Medical History:  Diagnosis Date  . Bipolar disorder ([place])   . Diabetes mellitus   . Diabetes mellitus, type II ([place])   . GERD (gastroesophageal reflux disease)   . History of borderline personality disorder   . Hypercholesteremia   . Hypertension   . Psoriasis   . Seasonal allergies   . Thyroid disease     Past Surgical History:  Procedure Laterality Date  . TONSILLECTOMY      Family Psychiatric History:  Family History  Problem Relation Age of Onset  . Personality disorder Mother   . Alcohol abuse Maternal Uncle   . Depression Maternal Uncle     Social History:  Social History   Socioeconomic History  . Marital status: Divorced    Spouse name: Not on file  . Number of children: Not on file  . Years of education: Not on file  . Highest education level: Not on file  Occupational History  . Not on file  Social Needs  . Financial resource strain: Not on file  . Food insecurity:    Worry: Not on file  Inability: Not on file  . Transportation needs:    Medical: Not on file    Non-medical: Not on file  Tobacco Use  . Smoking status: Current Every Day Smoker    Packs/day: 1.00    Types: Cigarettes, E-cigarettes  . Smokeless tobacco: Never Used  Substance and Sexual Activity  . Alcohol use: Yes    Alcohol/week: 0.0 oz    Comment: occasional- a few times a year. 6-7 drinks per episdoe  . Drug use: No    Types: Marijuana    Comment: last time was 3 yrs ago  . Sexual activity: Not on file  Lifestyle  . Physical activity:    Days per week: Not on file    Minutes per session: Not on file  . Stress: Not on file  Relationships  . Social connections:    Talks on phone: Not on file    Gets  together: Not on file    Attends religious service: Not on file    Active member of club or organization: Not on file    Attends meetings of clubs or organizations: Not on file    Relationship status: Not on file  Other Topics Concern  . Not on file  Social History Narrative  . Not on file    Allergies: No Known Allergies  Metabolic Disorder Labs: No results found for: HGBA1C, MPG No results found for: PROLACTIN No results found for: CHOL, TRIG, HDL, CHOLHDL, VLDL, LDLCALC No results found for: TSH  Therapeutic Level Labs: No results found for: LITHIUM No results found for: VALPROATE No components found for:  CBMZ  Current Medications: Current Outpatient Medications  Medication Sig Dispense Refill  . albuterol (PROVENTIL HFA;VENTOLIN HFA) 108 (90 BASE) MCG/ACT inhaler Inhale into the lungs every 6 (six) hours as needed for wheezing or shortness of breath.    . benazepril (LOTENSIN) 20 MG tablet Take 20 mg by mouth daily.    . busPIRone (BUSPAR) 10 MG tablet Three tablets twice a day 180 tablet 2  . cetirizine (ZYRTEC) 10 MG tablet Take 10 mg by mouth daily.    . cholecalciferol (VITAMIN D) 1000 UNITS tablet Take 1,000 Units by mouth daily.    . clonazePAM (KLONOPIN) 1 MG tablet Take 1 tablet (1 mg total) by mouth daily as needed for anxiety. 15 tablet 0  . fluticasone (FLONASE) 50 MCG/ACT nasal spray Place 2 sprays into both nostrils daily.    Marland Kitchen glipiZIDE (GLUCOTROL XL) 10 MG 24 hr tablet Take 10 mg by mouth daily.    . Guselkumab (TREMFYA) 100 MG/ML SOPN Inject 100 mg into the skin every 8 (eight) weeks.    Marland Kitchen levothyroxine (SYNTHROID, LEVOTHROID) 50 MCG tablet Take 50 mcg by mouth daily before breakfast.    . lithium carbonate 300 MG capsule Take 2 capsules (600 mg total) by mouth 2 (two) times daily with a meal. 120 capsule 2  . medroxyPROGESTERone (DEPO-PROVERA) 150 MG/ML injection Inject 150 mg into the muscle every 3 (three) months.    . metFORMIN (GLUCOPHAGE) 1000 MG  tablet Take 750 mg by mouth daily with breakfast.     . montelukast (SINGULAIR) 10 MG tablet Take 10 mg by mouth at bedtime.    . Multiple Vitamin (MULTIVITAMIN) capsule Take 1 capsule by mouth daily.    . ranitidine (ZANTAC) 150 MG capsule Take 150 mg by mouth 2 (two) times daily.    . simvastatin (ZOCOR) 20 MG tablet Take 40 mg by mouth daily.     Marland Kitchen  traZODone (DESYREL) 150 MG tablet Take 2.5 tablets (375 mg total) by mouth at bedtime. 2 and 1/2 tablets 75 tablet 2   No current facility-administered medications for this visit.      Musculoskeletal: Strength & Muscle Tone: within normal limits Gait & Station: normal Patient leans: N/A  Psychiatric Specialty Exam: Review of Systems  Constitutional: Negative for chills, diaphoresis and fever.  Psychiatric/Behavioral: Negative for depression, substance abuse and suicidal ideas. The patient is nervous/anxious. The patient does not have insomnia.     Blood pressure 135/86, pulse 86, height 5' 6.5" (1.689 m), weight 244 lb (110.7 kg).Body mass index is 38.79 kg/m.  General Appearance: Casual  Eye Contact:  Good  Speech:  Clear and Coherent and Normal Rate  Volume:  Normal  Mood:  Anxious  Affect:  Congruent  Thought Process:  Coherent and Descriptions of Associations: Circumstantial  Orientation:  Full (Time, Place, and Person)  Thought Content: Logical   Suicidal Thoughts:  No  Homicidal Thoughts:  No  Memory:  Immediate;   Good Recent;   Good Remote;   Good  Judgement:  Fair  Insight:  Shallow  Psychomotor Activity:  Normal  Concentration:  Concentration: Good and Attention Span: Good  Recall:  Good  Fund of Knowledge: Good  Language: Good  Akathisia:  No  Handed:  Right  AIMS (if indicated): not done  Assets:  Desire for Improvement Financial Resources/Insurance Housing Transportation Vocational/Educational  ADL's:  Intact  Cognition: WNL  Sleep:  Good   Screenings:  I reviewed the information below on 09/22/2017  and agree except where noted/changed Assessment and Plan:  Bipolar 1 disorder;  GAD Insomnia Cannabis abuse in remission      Medication management with supportive therapy. Risks and benefits, side effects and alternative treatment options discussed with patient. Pt was given an opportunity to ask questions about medication, illness, and treatment. All current psychiatric medications have been reviewed and discussed with the patient and adjusted as clinically appropriate. The patient has been provided an accurate and updated list of the medications being now prescribed. Patient expressed understanding of how their medications were to be used.  Pt verbalized understanding and verbal consent obtained for treatment.   The risk of un-intended pregnancy is moderate based on the fact that pt reports she is premenopausal. Pt is aware that these meds carry a teratogenic risk. Pt will discuss plan of action if she does or plans to become pregnant in the future.   Status of current problems: worsening anxiety   Meds: Lithium 600 mg p.o. twice daily for bipolar disorder BuSpar 30 mg p.o. twice daily as adjunct treatment for mood and treatment of anxiety Trazodone 375 mg p.o. nightly for insomnia D/c Vistaril  Start trial of Klonopin 0.5-1mg  p.o. daily as needed anxiety- given 15 tabs. Will do one refill if needed but will then d/c     Labs: reviewed labs on care everywhere done 09/09/17 lithium level 1.2 but states she is stable and denies SE, Bun 9 and Creatinine 0.55, TSH 1.4, CBC- wbc elevated      Therapy: brief supportive therapy provided. Discussed psychosocial stressors in detail.       Consultations:  [facility] Encouraged to follow up with PCP as needed   Pt denies SI and is at an acute low risk for suicide. Patient told to call clinic if any problems occur. Patient advised to go to ER if they should develop SI/HI, side effects, or if symptoms worsen.  Has crisis numbers to call  if needed. Pt verbalized understanding.   F/up in 1 months or sooner if needed   The duration of this appointment visit was 30 minutes of face-to-face time with the patient.  Greater than 50% of this time was spent in counseling, explanation of  diagnosis, planning of further management, and coordination of care   Charlcie Cradle, MD 09/22/2017, 1:44 PM

## 2017-11-03 ENCOUNTER — Encounter (HOSPITAL_COMMUNITY): Payer: Self-pay | Admitting: Psychiatry

## 2017-11-03 ENCOUNTER — Ambulatory Visit (HOSPITAL_COMMUNITY): Payer: BLUE CROSS/BLUE SHIELD | Admitting: Psychiatry

## 2017-11-03 DIAGNOSIS — F17210 Nicotine dependence, cigarettes, uncomplicated: Secondary | ICD-10-CM

## 2017-11-03 DIAGNOSIS — Z818 Family history of other mental and behavioral disorders: Secondary | ICD-10-CM | POA: Diagnosis not present

## 2017-11-03 DIAGNOSIS — Z79899 Other long term (current) drug therapy: Secondary | ICD-10-CM

## 2017-11-03 DIAGNOSIS — F5105 Insomnia due to other mental disorder: Secondary | ICD-10-CM

## 2017-11-03 DIAGNOSIS — Z811 Family history of alcohol abuse and dependence: Secondary | ICD-10-CM

## 2017-11-03 DIAGNOSIS — F411 Generalized anxiety disorder: Secondary | ICD-10-CM | POA: Diagnosis not present

## 2017-11-03 DIAGNOSIS — F1211 Cannabis abuse, in remission: Secondary | ICD-10-CM

## 2017-11-03 DIAGNOSIS — F319 Bipolar disorder, unspecified: Secondary | ICD-10-CM | POA: Diagnosis not present

## 2017-11-03 DIAGNOSIS — F99 Mental disorder, not otherwise specified: Secondary | ICD-10-CM

## 2017-11-03 MED ORDER — BUSPIRONE HCL 10 MG PO TABS: ORAL_TABLET | ORAL | 2 refills | Status: DC

## 2017-11-03 MED ORDER — TRAZODONE HCL 150 MG PO TABS: 375.0000 mg | ORAL_TABLET | Freq: Every day | ORAL | 2 refills | Status: DC

## 2017-11-03 MED ORDER — LITHIUM CARBONATE 300 MG PO CAPS: 600.0000 mg | ORAL_CAPSULE | Freq: Two times a day (BID) | ORAL | 2 refills | Status: DC

## 2017-11-03 NOTE — Progress Notes (Signed)
BH MD/PA/NP OP Progress Note  11/03/2017 9:47 AM Valerie Reilly  MRN:  [id number]  Chief Complaint:  Chief Complaint    Follow-up     HPI: Pt reports she is doing better. She has been going to work and not much of anything else. She is living with her daughter due to financial stressors but wants to move into her own place soon. Pt has taken about 7 Klonopin since her last visit when feeling very anxious. Pt is feeling lonely.  She states that she does not have good friends.  She wants to make better friends.  She states that she enjoys cooking but engaging in the activity makes her miss her boyfriend.  The grief support group is over and she is thinking about calling some of the members to get together for a social visit.  She states that she is sleeping a lot out of boredom.  She does not think that she is feeling depressed.  She denies hopelessness.  She denies SI/HI.  She has on and off crying spells.  Patient denies any manic or hypomanic-like symptoms since her last visit.  Patient states that she feels the medications are working and she is taking them as prescribed.  Visit Diagnosis:    ICD-10-CM   1. Bipolar I disorder (HCC) F31.9 busPIRone (BUSPAR) 10 MG tablet    lithium carbonate 300 MG capsule    traZODone (DESYREL) 150 MG tablet  2. GAD (generalized anxiety disorder) F41.1 busPIRone (BUSPAR) 10 MG tablet  3. Insomnia due to other mental disorder F51.05 traZODone (DESYREL) 150 MG tablet   F99       Past Psychiatric History:  Diagnosis: Bipolar, Cannabis abuse, Borderline PD  Hospitalizations: multiple- last time was in 2014 at [facility] for mania  Outpatient Care: [facility] in [place], can't recall other psychiatrists. Has been in group and individual therapy in the past  Substance Abuse Care: drug class in 2014 for [facility]  Self-Mutilation: denies  Suicidal Attempts: yes- many years ago. Has hx of 2-3 previous attempts  Violent Behaviors: denies but later stated maybe when  psychotic or manic?   Past Medical History:  Past Medical History:  Diagnosis Date  . Bipolar disorder ([place])   . Diabetes mellitus   . Diabetes mellitus, type II ([place])   . GERD (gastroesophageal reflux disease)   . History of borderline personality disorder   . Hypercholesteremia   . Hypertension   . Psoriasis   . Seasonal allergies   . Thyroid disease     Past Surgical History:  Procedure Laterality Date  . TONSILLECTOMY      Family Psychiatric History:  Family History  Problem Relation Age of Onset  . Personality disorder Mother   . Alcohol abuse Maternal Uncle   . Depression Maternal Uncle     Social History:  Social History   Socioeconomic History  . Marital status: Divorced    Spouse name: Not on file  . Number of children: Not on file  . Years of education: Not on file  . Highest education level: Not on file  Occupational History  . Not on file  Social Needs  . Financial resource strain: Not on file  . Food insecurity:    Worry: Not on file    Inability: Not on file  . Transportation needs:    Medical: Not on file    Non-medical: Not on file  Tobacco Use  . Smoking status: Current Every Day Smoker    Packs/day: 1.00  Types: Cigarettes, E-cigarettes  . Smokeless tobacco: Never Used  Substance and Sexual Activity  . Alcohol use: Yes    Alcohol/week: 0.0 standard drinks    Comment: occasional- a few times a year. 6-7 drinks per episdoe  . Drug use: No    Types: Marijuana    Comment: last time was 3 yrs ago  . Sexual activity: Not on file  Lifestyle  . Physical activity:    Days per week: Not on file    Minutes per session: Not on file  . Stress: Not on file  Relationships  . Social connections:    Talks on phone: Not on file    Gets together: Not on file    Attends religious service: Not on file    Active member of club or organization: Not on file    Attends meetings of clubs or organizations: Not on file    Relationship status: Not on  file  Other Topics Concern  . Not on file  Social History Narrative  . Not on file    Allergies: No Known Allergies  Metabolic Disorder Labs: No results found for: HGBA1C, MPG No results found for: PROLACTIN No results found for: CHOL, TRIG, HDL, CHOLHDL, VLDL, LDLCALC No results found for: TSH  Therapeutic Level Labs: No results found for: LITHIUM No results found for: VALPROATE No components found for:  CBMZ  Current Medications: Current Outpatient Medications  Medication Sig Dispense Refill  . albuterol (PROVENTIL HFA;VENTOLIN HFA) 108 (90 BASE) MCG/ACT inhaler Inhale into the lungs every 6 (six) hours as needed for wheezing or shortness of breath.    . benazepril (LOTENSIN) 20 MG tablet Take 20 mg by mouth daily.    . busPIRone (BUSPAR) 10 MG tablet Three tablets twice a day 180 tablet 2  . cetirizine (ZYRTEC) 10 MG tablet Take 10 mg by mouth daily.    . cholecalciferol (VITAMIN D) 1000 UNITS tablet Take 1,000 Units by mouth daily.    . clonazePAM (KLONOPIN) 1 MG tablet Take 1 tablet (1 mg total) by mouth daily as needed for anxiety. 15 tablet 0  . fluticasone (FLONASE) 50 MCG/ACT nasal spray Place 2 sprays into both nostrils daily.    Marland Kitchen glipiZIDE (GLUCOTROL XL) 10 MG 24 hr tablet Take 10 mg by mouth daily.    . Guselkumab (TREMFYA) 100 MG/ML SOPN Inject 100 mg into the skin every 8 (eight) weeks.    Marland Kitchen levothyroxine (SYNTHROID, LEVOTHROID) 50 MCG tablet Take 50 mcg by mouth daily before breakfast.    . lithium carbonate 300 MG capsule Take 2 capsules (600 mg total) by mouth 2 (two) times daily with a meal. 120 capsule 2  . medroxyPROGESTERone (DEPO-PROVERA) 150 MG/ML injection Inject 150 mg into the muscle every 3 (three) months.    . metFORMIN (GLUCOPHAGE) 1000 MG tablet Take 750 mg by mouth daily with breakfast.     . montelukast (SINGULAIR) 10 MG tablet Take 10 mg by mouth at bedtime.    . Multiple Vitamin (MULTIVITAMIN) capsule Take 1 capsule by mouth daily.    .  ranitidine (ZANTAC) 150 MG capsule Take 150 mg by mouth 2 (two) times daily.    . simvastatin (ZOCOR) 20 MG tablet Take 40 mg by mouth daily.     . traZODone (DESYREL) 150 MG tablet Take 2.5 tablets (375 mg total) by mouth at bedtime. 2 and 1/2 tablets 75 tablet 2   No current facility-administered medications for this visit.      Musculoskeletal: Strength &  Muscle Tone: within normal limits Gait & Station: normal Patient leans: N/A  Psychiatric Specialty Exam: Review of Systems  Constitutional: Negative for chills, fever and weight loss.  Psychiatric/Behavioral: Negative for depression, hallucinations and suicidal ideas. The patient is not nervous/anxious and does not have insomnia.     Blood pressure 128/78, pulse 82, height 5\' 6"  (1.676 m), weight 246 lb (111.6 kg).Body mass index is 39.71 kg/m.  General Appearance: Casual  Eye Contact:  Good  Speech:  Clear and Coherent and Normal Rate  Volume:  Normal  Mood:  Euthymic  Affect:  Blunt  Thought Process:  Goal Directed and Descriptions of Associations: Intact  Orientation:  Full (Time, Place, and Person)  Thought Content:  Logical  Suicidal Thoughts:  No  Homicidal Thoughts:  No  Memory:  Immediate;   Good Recent;   Good Remote;   Good  Judgement:  Good  Insight:  Good  Psychomotor Activity:  Normal  Concentration:  Concentration: Good and Attention Span: Good  Recall:  Good  Fund of Knowledge:  Good  Language:  Good  Akathisia:  No  Handed:  Right  AIMS (if indicated):     Assets:  Communication Skills Desire for Improvement Financial Resources/Insurance Housing Leisure Time Resilience Talents/Skills Transportation Vocational/Educational  ADL's:  Intact  Cognition:  WNL  Sleep:   good     Screenings:  I reviewed the information below on 11/03/2017 and have updated it Assessment and Plan:  Bipolar 1 disorder;  GAD Insomnia Cannabis abuse in remission      Medication management with supportive  therapy. Risks and benefits, side effects and alternative treatment options discussed with patient. Pt was given an opportunity to ask questions about medication, illness, and treatment. All current psychiatric medications have been reviewed and discussed with the patient and adjusted as clinically appropriate. The patient has been provided an accurate and updated list of the medications being now prescribed. Patient expressed understanding of how their medications were to be used.  Pt verbalized understanding and verbal consent obtained for treatment.   The risk of un-intended pregnancy is moderate based on the fact that pt reports she is premenopausal. Pt is aware that these meds carry a teratogenic risk. Pt will discuss plan of action if she does or plans to become pregnant in the future.   Status of current problems: improvign   Meds: Lithium 600 mg p.o. twice daily for bipolar disorder BuSpar 30 mg p.o. twice daily as adjunct treatment for mood and treatment of anxiety Trazodone 375 mg p.o. nightly for insomnia Klonopin 0.5-1mg  p.o. daily as needed anxiety- given 15 tabs and today she still has 7 tabs left. No refills     Labs: reviewed labs on care everywhere done 09/09/17 lithium level 1.2 but states she is stable and denies SE, Bun 9 and Creatinine 0.55, TSH 1.4, CBC- wbc elevated      Therapy: brief supportive therapy provided. Discussed psychosocial stressors in detail.    Encourage patient to find hobbies or social outlets to fill her free time.  She is spending a lot of time alone.   Consultations:  Encouraged to follow up with PCP as needed   Pt denies SI and is at an acute low risk for suicide. Patient told to call clinic if any problems occur. Patient advised to go to ER if they should develop SI/HI, side effects, or if symptoms worsen. Has crisis numbers to call if needed. Pt verbalized understanding.   F/up in 2 months  or sooner if needed   The duration of this appointment  visit was 20 minutes of face-to-face time with the patient.  Greater than 50% of this time was spent in counseling, explanation of  diagnosis, planning of further management, and coordination of care   Charlcie Cradle, MD 11/03/2017, 9:47 AM

## 2017-11-10 ENCOUNTER — Ambulatory Visit (HOSPITAL_COMMUNITY): Payer: Self-pay | Admitting: Psychiatry

## 2017-12-29 ENCOUNTER — Ambulatory Visit (HOSPITAL_COMMUNITY): Payer: BLUE CROSS/BLUE SHIELD | Admitting: Psychiatry

## 2017-12-29 ENCOUNTER — Encounter (HOSPITAL_COMMUNITY): Payer: Self-pay | Admitting: Psychiatry

## 2017-12-29 DIAGNOSIS — F319 Bipolar disorder, unspecified: Secondary | ICD-10-CM

## 2017-12-29 DIAGNOSIS — F99 Mental disorder, not otherwise specified: Secondary | ICD-10-CM

## 2017-12-29 DIAGNOSIS — F411 Generalized anxiety disorder: Secondary | ICD-10-CM

## 2017-12-29 DIAGNOSIS — F5105 Insomnia due to other mental disorder: Secondary | ICD-10-CM

## 2017-12-29 MED ORDER — CLONAZEPAM 1 MG PO TABS: 1.0000 mg | ORAL_TABLET | Freq: Every day | ORAL | 0 refills | Status: DC | PRN

## 2017-12-29 MED ORDER — BUSPIRONE HCL 10 MG PO TABS: ORAL_TABLET | ORAL | 2 refills | Status: DC

## 2017-12-29 MED ORDER — LITHIUM CARBONATE 300 MG PO CAPS: 600.0000 mg | ORAL_CAPSULE | Freq: Two times a day (BID) | ORAL | 2 refills | Status: DC

## 2017-12-29 MED ORDER — TRAZODONE HCL 150 MG PO TABS: 375.0000 mg | ORAL_TABLET | Freq: Every day | ORAL | 2 refills | Status: DC

## 2017-12-29 NOTE — Progress Notes (Signed)
[place] MD/PA/NP OP Progress Note  12/29/2017 9:07 AM Valerie Reilly  MRN:  [id number]  Chief Complaint:  Chief Complaint    Follow-up     HPI: Cyril Mourning tells me that nothing is really changed since our last visit together.  She goes to work and comes home.  She is not very social beyond her daughter.  She is concerned about this upcoming holiday season as it will be the first without her husband.  She continues to experience on and off grief.  She denies SI/HI.  She denies any manic or hypomanic-like symptoms.  Her anxiety is overall well controlled and she does not think she is taken any Klonopin since our last visit in August.   Visit Diagnosis:    ICD-10-CM   1. Bipolar I disorder (HCC) F31.9 busPIRone (BUSPAR) 10 MG tablet    traZODone (DESYREL) 150 MG tablet    lithium carbonate 300 MG capsule  2. GAD (generalized anxiety disorder) F41.1 busPIRone (BUSPAR) 10 MG tablet    clonazePAM (KLONOPIN) 1 MG tablet  3. Insomnia due to other mental disorder F51.05 traZODone (DESYREL) 150 MG tablet   F99       Past Psychiatric History: reviewed  Diagnosis: Bipolar, Cannabis abuse, Borderline PD  Hospitalizations: multiple- last time was in 2014 at [facility] for mania  Outpatient Care: [facility] in [place], can't recall other psychiatrists. Has been in group and individual therapy in the past  Substance Abuse Care: drug class in 2014 for [facility]  Self-Mutilation: denies  Suicidal Attempts: yes- many years ago. Has hx of 2-3 previous attempts  Violent Behaviors: denies but later stated maybe when psychotic or manic?   Past Medical History:  Past Medical History:  Diagnosis Date  . Bipolar disorder ([place])   . Diabetes mellitus   . Diabetes mellitus, type II ([place])   . GERD (gastroesophageal reflux disease)   . History of borderline personality disorder   . Hypercholesteremia   . Hypertension   . Psoriasis   . Seasonal allergies   . Thyroid disease     Past Surgical History:  Procedure  Laterality Date  . TONSILLECTOMY      Family Psychiatric History:  Family History  Problem Relation Age of Onset  . Personality disorder Mother   . Alcohol abuse Maternal Uncle   . Depression Maternal Uncle     Social History:  Social History   Socioeconomic History  . Marital status: Divorced    Spouse name: Not on file  . Number of children: Not on file  . Years of education: Not on file  . Highest education level: Not on file  Occupational History  . Not on file  Social Needs  . Financial resource strain: Not on file  . Food insecurity:    Worry: Not on file    Inability: Not on file  . Transportation needs:    Medical: Not on file    Non-medical: Not on file  Tobacco Use  . Smoking status: Current Every Day Smoker    Packs/day: 1.00    Types: Cigarettes, E-cigarettes  . Smokeless tobacco: Never Used  Substance and Sexual Activity  . Alcohol use: Yes    Alcohol/week: 0.0 standard drinks    Comment: occasional- a few times a year. 6-7 drinks per episdoe  . Drug use: No    Types: Marijuana    Comment: last time was 3 yrs ago  . Sexual activity: Not on file  Lifestyle  . Physical activity:  Days per week: Not on file    Minutes per session: Not on file  . Stress: Not on file  Relationships  . Social connections:    Talks on phone: Not on file    Gets together: Not on file    Attends religious service: Not on file    Active member of club or organization: Not on file    Attends meetings of clubs or organizations: Not on file    Relationship status: Not on file  Other Topics Concern  . Not on file  Social History Narrative  . Not on file    Allergies: No Known Allergies  Metabolic Disorder Labs: No results found for: HGBA1C, MPG No results found for: PROLACTIN No results found for: CHOL, TRIG, HDL, CHOLHDL, VLDL, LDLCALC No results found for: TSH  Therapeutic Level Labs: No results found for: LITHIUM No results found for: VALPROATE No  components found for:  CBMZ  Current Medications: Current Outpatient Medications  Medication Sig Dispense Refill  . albuterol (PROVENTIL HFA;VENTOLIN HFA) 108 (90 BASE) MCG/ACT inhaler Inhale into the lungs every 6 (six) hours as needed for wheezing or shortness of breath.    . benazepril (LOTENSIN) 20 MG tablet Take 20 mg by mouth daily.    . busPIRone (BUSPAR) 10 MG tablet Three tablets twice a day 180 tablet 2  . cetirizine (ZYRTEC) 10 MG tablet Take 10 mg by mouth daily.    . cholecalciferol (VITAMIN D) 1000 UNITS tablet Take 1,000 Units by mouth daily.    . clonazePAM (KLONOPIN) 1 MG tablet Take 1 tablet (1 mg total) by mouth daily as needed for anxiety. 15 tablet 0  . fluticasone (FLONASE) 50 MCG/ACT nasal spray Place 2 sprays into both nostrils daily.    Marland Kitchen glipiZIDE (GLUCOTROL XL) 10 MG 24 hr tablet Take 10 mg by mouth daily.    . Guselkumab (TREMFYA) 100 MG/ML SOPN Inject 100 mg into the skin every 8 (eight) weeks.    Marland Kitchen levothyroxine (SYNTHROID, LEVOTHROID) 50 MCG tablet Take 50 mcg by mouth daily before breakfast.    . lithium carbonate 300 MG capsule Take 2 capsules (600 mg total) by mouth 2 (two) times daily with a meal. 120 capsule 2  . medroxyPROGESTERone (DEPO-PROVERA) 150 MG/ML injection Inject 150 mg into the muscle every 3 (three) months.    . metFORMIN (GLUCOPHAGE) 1000 MG tablet Take 750 mg by mouth daily with breakfast.     . montelukast (SINGULAIR) 10 MG tablet Take 10 mg by mouth at bedtime.    . Multiple Vitamin (MULTIVITAMIN) capsule Take 1 capsule by mouth daily.    . ranitidine (ZANTAC) 150 MG capsule Take 150 mg by mouth 2 (two) times daily.    . simvastatin (ZOCOR) 20 MG tablet Take 40 mg by mouth daily.     . traZODone (DESYREL) 150 MG tablet Take 2.5 tablets (375 mg total) by mouth at bedtime. 2 and 1/2 tablets 75 tablet 2   No current facility-administered medications for this visit.      Musculoskeletal: Strength & Muscle Tone: within normal limits Gait &  Station: normal Patient leans: N/A  Psychiatric Specialty Exam: Review of Systems  Constitutional: Negative for chills, diaphoresis and fever.  HENT: Negative for congestion, sinus pain and sore throat.     Blood pressure 120/78, pulse 79, height 5' 6.5" (1.689 m), weight 244 lb (110.7 kg), SpO2 99 %.Body mass index is 38.79 kg/m.  General Appearance: Casual and Wearing her PJs  Eye Contact:  Good  Speech:  Clear and Coherent and Normal Rate  Volume:  Increased  Mood:  Euthymic  Affect:  Blunt  Thought Process:  Goal Directed and Descriptions of Associations: Intact  Orientation:  Full (Time, Place, and Person)  Thought Content:  Logical  Suicidal Thoughts:  No  Homicidal Thoughts:  No  Memory:  Immediate;   Good  Judgement:  Fair  Insight:  Fair  Psychomotor Activity:  Normal  Concentration:  Concentration: Good  Recall:  Good  Fund of Knowledge:  Good  Language:  Good  Akathisia:  No  Handed:  Right  AIMS (if indicated):     Assets:  Communication Skills Desire for Improvement Housing Resilience Social Support Transportation Vocational/Educational  ADL's:  Intact  Cognition:  WNL  Sleep:   good        Screenings:  I reviewed the information below on 12/29/2017 and have updated it Assessment and Plan:  Bipolar 1 disorder;  GAD Insomnia Cannabis abuse in remission      Medication management with supportive therapy. Risks and benefits, side effects and alternative treatment options discussed with patient. Pt was given an opportunity to ask questions about medication, illness, and treatment. All current psychiatric medications have been reviewed and discussed with the patient and adjusted as clinically appropriate. The patient has been provided an accurate and updated list of the medications being now prescribed. Patient expressed understanding of how their medications were to be used.  Pt verbalized understanding and verbal consent obtained for treatment.    The risk of un-intended pregnancy is moderate based on the fact that pt reports she is premenopausal. Pt is aware that these meds carry a teratogenic risk. Pt will discuss plan of action if she does or plans to become pregnant in the future.   Status of current problems: Stable   Meds: Lithium 600 mg p.o. twice daily for bipolar disorder BuSpar 30 mg p.o. twice daily as adjunct treatment for mood and treatment of anxiety Trazodone 375 mg p.o. nightly for insomnia Klonopin 0.5-1mg  p.o. daily as needed anxiety     Labs: none      Therapy: brief supportive therapy provided. Discussed psychosocial stressors in detail.   Encourage patient to utilize supports and plan activities for significant holidays and anniversaries.  This is the first holiday season she will be experiencing without her husband   Consultations:  Encouraged to follow up with PCP as needed   Pt denies SI and is at an acute low risk for suicide. Patient told to call clinic if any problems occur. Patient advised to go to ER if they should develop SI/HI, side effects, or if symptoms worsen. Has crisis numbers to call if needed. Pt verbalized understanding.   F/up in 2 months or sooner if needed   The duration of this appointment visit was 20 minutes of face-to-face time with the patient.  Greater than 50% of this time was spent in counseling, explanation of  diagnosis, planning of further management, and coordination of care   Charlcie Cradle, MD 12/29/2017, 9:07 AM

## 2018-02-23 ENCOUNTER — Ambulatory Visit (HOSPITAL_COMMUNITY): Payer: BLUE CROSS/BLUE SHIELD | Admitting: Psychiatry

## 2018-04-01 ENCOUNTER — Ambulatory Visit (HOSPITAL_COMMUNITY): Payer: BLUE CROSS/BLUE SHIELD | Admitting: Psychiatry

## 2018-04-06 ENCOUNTER — Ambulatory Visit (INDEPENDENT_AMBULATORY_CARE_PROVIDER_SITE_OTHER): Payer: BLUE CROSS/BLUE SHIELD | Admitting: Psychiatry

## 2018-04-06 ENCOUNTER — Encounter (HOSPITAL_COMMUNITY): Payer: Self-pay | Admitting: Psychiatry

## 2018-04-06 DIAGNOSIS — F411 Generalized anxiety disorder: Secondary | ICD-10-CM | POA: Diagnosis not present

## 2018-04-06 DIAGNOSIS — F5105 Insomnia due to other mental disorder: Secondary | ICD-10-CM | POA: Diagnosis not present

## 2018-04-06 DIAGNOSIS — F319 Bipolar disorder, unspecified: Secondary | ICD-10-CM

## 2018-04-06 DIAGNOSIS — F99 Mental disorder, not otherwise specified: Secondary | ICD-10-CM

## 2018-04-06 MED ORDER — BUSPIRONE HCL 10 MG PO TABS: ORAL_TABLET | ORAL | 2 refills | Status: DC

## 2018-04-06 MED ORDER — TRAZODONE HCL 150 MG PO TABS: 375.0000 mg | ORAL_TABLET | Freq: Every day | ORAL | 2 refills | Status: DC

## 2018-04-06 MED ORDER — LITHIUM CARBONATE 300 MG PO CAPS: 600.0000 mg | ORAL_CAPSULE | Freq: Two times a day (BID) | ORAL | 2 refills | Status: DC

## 2018-04-06 MED ORDER — CLONAZEPAM 0.5 MG PO TABS: 0.5000 mg | ORAL_TABLET | Freq: Every evening | ORAL | 0 refills | Status: DC | PRN

## 2018-04-06 NOTE — Progress Notes (Signed)
Valerie Reilly  MRN:  [id number]  Chief Complaint:  Chief Complaint    Follow-up; Medication Refill     HPI: Valerie Reilly spend Thanksgiving with a friend and Christmas with some family members.  It was not as bad as she thought it was going to be.  She is worried about Valentine's Day is that is close to when he passed.  There is also the day that her best friend passed several years ago.  She knows that she should not be alone on that day and is thinking of asking her only friend to schedule something with her.  She continues to feel grief but does not think that she is depressed.  Valerie Reilly is worried that she may become depressed around Valentine's Day.  She is adamant that she does not want her medications changed at this time.  She is denying any manic or hypomanic-like symptoms.  Her sleep is fair.  Her anxiety is manageable and she is only taking 0.5 mg a handful of times over these last few months.    Visit Diagnosis:    ICD-10-CM   1. Bipolar I disorder (HCC) F31.9 busPIRone (BUSPAR) 10 MG tablet    lithium carbonate 300 MG capsule    traZODone (DESYREL) 150 MG tablet  2. GAD (generalized anxiety disorder) F41.1 busPIRone (BUSPAR) 10 MG tablet    clonazePAM (KLONOPIN) 0.5 MG tablet  3. Insomnia due to other mental disorder F51.05 traZODone (DESYREL) 150 MG tablet   F99       Past Psychiatric History: reviewed  Diagnosis: Bipolar, Cannabis abuse, Borderline PD  Hospitalizations: multiple- last time was in 2014 at [facility] for mania  Outpatient Care: [facility] in [place], can't recall other psychiatrists. Has been in group and individual therapy in the past  Substance Abuse Care: drug class in 2014 for [facility]  Self-Mutilation: denies  Suicidal Attempts: yes- many years ago. Has hx of 2-3 previous attempts  Violent Behaviors: denies but later stated maybe when psychotic or manic?   Past Medical History:  Past Medical History:   Diagnosis Date  . Bipolar disorder ([place])   . Diabetes mellitus   . Diabetes mellitus, type II ([place])   . GERD (gastroesophageal reflux disease)   . History of borderline personality disorder   . Hypercholesteremia   . Hypertension   . Psoriasis   . Seasonal allergies   . Thyroid disease     Past Surgical History:  Procedure Laterality Date  . TONSILLECTOMY      Family Psychiatric History:  Family History  Problem Relation Age of Onset  . Personality disorder Mother   . Alcohol abuse Maternal Uncle   . Depression Maternal Uncle     Social History:  Social History   Socioeconomic History  . Marital status: Divorced    Spouse name: Not on file  . Number of children: Not on file  . Years of education: Not on file  . Highest education level: Not on file  Occupational History  . Not on file  Social Needs  . Financial resource strain: Not on file  . Food insecurity:    Worry: Not on file    Inability: Not on file  . Transportation needs:    Medical: Not on file    Non-medical: Not on file  Tobacco Use  . Smoking status: Current Every Day Smoker    Packs/day: 1.00    Types: Cigarettes, E-cigarettes  . Smokeless tobacco: Never  Used  Substance and Sexual Activity  . Alcohol use: Yes    Alcohol/week: 0.0 standard drinks    Comment: occasional- a few times a year. 6-7 drinks per episdoe  . Drug use: No    Types: Marijuana    Comment: last time was 3 yrs ago  . Sexual activity: Not on file  Lifestyle  . Physical activity:    Days per week: Not on file    Minutes per session: Not on file  . Stress: Not on file  Relationships  . Social connections:    Talks on phone: Not on file    Gets together: Not on file    Attends religious service: Not on file    Active member of club or organization: Not on file    Attends meetings of clubs or organizations: Not on file    Relationship status: Not on file  Other Topics Concern  . Not on file  Social History Narrative   . Not on file    Allergies: No Known Allergies  Metabolic Disorder Labs: No results found for: HGBA1C, MPG No results found for: PROLACTIN No results found for: CHOL, TRIG, HDL, CHOLHDL, VLDL, LDLCALC No results found for: TSH  Therapeutic Level Labs: No results found for: LITHIUM No results found for: VALPROATE No components found for:  CBMZ  Current Medications: Current Outpatient Medications  Medication Sig Dispense Refill  . albuterol (PROVENTIL HFA;VENTOLIN HFA) 108 (90 BASE) MCG/ACT inhaler Inhale into the lungs every 6 (six) hours as needed for wheezing or shortness of breath.    . benazepril (LOTENSIN) 20 MG tablet Take 20 mg by mouth daily.    . busPIRone (BUSPAR) 10 MG tablet Three tablets twice a day 180 tablet 2  . cetirizine (ZYRTEC) 10 MG tablet Take 10 mg by mouth daily.    . cholecalciferol (VITAMIN D) 1000 UNITS tablet Take 1,000 Units by mouth daily.    . clonazePAM (KLONOPIN) 0.5 MG tablet Take 1 tablet (0.5 mg total) by mouth at bedtime as needed for anxiety. 30 tablet 0  . fluticasone (FLONASE) 50 MCG/ACT nasal spray Place 2 sprays into both nostrils daily.    . Guselkumab (TREMFYA) 100 MG/ML SOPN Inject 100 mg into the skin every 8 (eight) weeks.    Marland Kitchen levothyroxine (SYNTHROID, LEVOTHROID) 50 MCG tablet Take 50 mcg by mouth daily before breakfast.    . lithium carbonate 300 MG capsule Take 2 capsules (600 mg total) by mouth 2 (two) times daily with a meal. 120 capsule 2  . medroxyPROGESTERone (DEPO-PROVERA) 150 MG/ML injection Inject 150 mg into the muscle every 3 (three) months.    . metFORMIN (GLUCOPHAGE) 1000 MG tablet Take 750 mg by mouth daily with breakfast.     . montelukast (SINGULAIR) 10 MG tablet Take 10 mg by mouth at bedtime.    . Multiple Vitamin (MULTIVITAMIN) capsule Take 1 capsule by mouth daily.    . ranitidine (ZANTAC) 150 MG capsule Take 150 mg by mouth 2 (two) times daily.    . simvastatin (ZOCOR) 20 MG tablet Take 40 mg by mouth daily.      . traZODone (DESYREL) 150 MG tablet Take 2.5 tablets (375 mg total) by mouth at bedtime. 2 and 1/2 tablets 75 tablet 2   No current facility-administered medications for this visit.      Musculoskeletal: Strength & Muscle Tone: within normal limits Gait & Station: normal Patient leans: N/A  Psychiatric Specialty Exam: Review of Systems  Constitutional: Negative for chills,  diaphoresis and fever.  Respiratory: Negative for cough, sputum production and shortness of breath.     There were no vitals taken for this visit.There is no height or weight on file to calculate BMI.  General Appearance: Casual  Eye Contact:  Good  Speech:  Clear and Coherent and Normal Rate  Volume:  Normal  Mood:  Euthymic  Affect:  Depressed  Thought Process:  Goal Directed and Descriptions of Associations: Intact  Orientation:  Full (Time, Place, and Person)  Thought Content:  Logical  Suicidal Thoughts:  No  Homicidal Thoughts:  No  Memory:  Immediate;   Good  Judgement:  Fair  Insight:  Fair  Psychomotor Activity:  Normal  Concentration:  Concentration: Good  Recall:  Good  Fund of Knowledge:  Good  Language:  Good  Akathisia:  No  Handed:  Right  AIMS (if indicated):     Assets:  Communication Skills Desire for Improvement Financial Resources/Insurance Housing Transportation Vocational/Educational  ADL's:  Intact  Cognition:  WNL  Sleep:   good        Screenings:  I reviewed the information below on 04/06/2018 and have updated it Assessment and Plan:   Bipolar 1 disorder; GAD Insomnia Cannabis abuse in remission      Medication management with supportive therapy. Risks and benefits, side effects and alternative treatment options discussed with patient. Pt was given an opportunity to ask questions about medication, illness, and treatment. All current psychiatric medications have been reviewed and discussed with the patient and adjusted as clinically appropriate. The patient  has been provided an accurate and updated list of the medications being now prescribed. Patient expressed understanding of how their medications were to be used.  Pt verbalized understanding and verbal consent obtained for treatment.   The risk of un-intended pregnancy is moderate based on the fact that pt reports she is premenopausal. Pt is aware that these meds carry a teratogenic risk. Pt will discuss plan of action if she does or plans to become pregnant in the future.   Status of current problems: Depression is ongoing   Meds: Lithium 600 mg p.o. twice daily for bipolar disorder BuSpar 30 mg p.o. twice daily as adjunct treatment for mood and treatment of anxiety Trazodone 375 mg p.o. nightly for insomnia Klonopin 0.5-1mg  p.o. daily as needed anxiety     Labs: I reviewed the labs from [facility] done on 03/13/2018 in care everywhere.  Her CBC shows elevated white blood cell count which she reports is chronic.  CMP shows glucose of 228 and hemoglobin A1c of 8.4.  Triglycerides elevated and HDL is low.  Vitamin D level is low.  Patient reports that she is working with her primary care doctor      Therapy: brief supportive therapy provided. Discussed psychosocial stressors in detail.   Encourage patient to utilize supports and plan activities for significant holidays and anniversaries.  This is the first holiday season she will be experiencing without her husband   Consultations:  none   Pt denies SI and is at an acute low risk for suicide. Patient told to call clinic if any problems occur. Patient advised to go to ER if they should develop SI/HI, side effects, or if symptoms worsen. Has crisis numbers to call if needed. Pt verbalized understanding.   F/up in 2 months or sooner if needed   The duration of this appointment visit was 20 minutes of face-to-face time with the patient.  Greater than 50% of this  time was spent in counseling, explanation of  diagnosis, planning of further  management, and coordination of care   Charlcie Cradle, MD 04/06/2018, 10:53 AM

## 2018-05-25 ENCOUNTER — Ambulatory Visit (HOSPITAL_COMMUNITY): Payer: BLUE CROSS/BLUE SHIELD | Admitting: Psychiatry

## 2018-07-29 DIAGNOSIS — E785 Hyperlipidemia, unspecified: Secondary | ICD-10-CM | POA: Insufficient documentation

## 2019-01-04 DIAGNOSIS — Z4889 Encounter for other specified surgical aftercare: Secondary | ICD-10-CM | POA: Insufficient documentation

## 2019-01-04 DIAGNOSIS — Z79899 Other long term (current) drug therapy: Secondary | ICD-10-CM | POA: Insufficient documentation

## 2019-02-21 ENCOUNTER — Other Ambulatory Visit (HOSPITAL_COMMUNITY): Payer: Self-pay | Admitting: Psychiatry

## 2019-02-21 DIAGNOSIS — F319 Bipolar disorder, unspecified: Secondary | ICD-10-CM

## 2019-08-24 ENCOUNTER — Other Ambulatory Visit: Payer: Self-pay

## 2019-08-24 ENCOUNTER — Ambulatory Visit (HOSPITAL_COMMUNITY)
Admission: EM | Admit: 2019-08-24 | Discharge: 2019-08-24 | Disposition: A | Discharge status: Home / Self Care | Payer: No Payment, Other | Attending: Family | Admitting: Family

## 2019-08-24 DIAGNOSIS — Z760 Encounter for issue of repeat prescription: Secondary | ICD-10-CM | POA: Insufficient documentation

## 2019-08-24 DIAGNOSIS — F1290 Cannabis use, unspecified, uncomplicated: Secondary | ICD-10-CM | POA: Insufficient documentation

## 2019-08-24 DIAGNOSIS — F99 Mental disorder, not otherwise specified: Secondary | ICD-10-CM

## 2019-08-24 DIAGNOSIS — Z7289 Other problems related to lifestyle: Secondary | ICD-10-CM | POA: Insufficient documentation

## 2019-08-24 DIAGNOSIS — F5105 Insomnia due to other mental disorder: Secondary | ICD-10-CM

## 2019-08-24 DIAGNOSIS — F319 Bipolar disorder, unspecified: Secondary | ICD-10-CM | POA: Diagnosis not present

## 2019-08-24 DIAGNOSIS — F411 Generalized anxiety disorder: Secondary | ICD-10-CM

## 2019-08-24 DIAGNOSIS — Z915 Personal history of self-harm: Secondary | ICD-10-CM | POA: Insufficient documentation

## 2019-08-24 MED ORDER — LITHIUM CARBONATE 300 MG PO CAPS: 600.0000 mg | ORAL_CAPSULE | Freq: Two times a day (BID) | ORAL | 0 refills | Status: DC

## 2019-08-24 MED ORDER — TRAZODONE HCL 150 MG PO TABS: 375.0000 mg | ORAL_TABLET | Freq: Every day | ORAL | 0 refills | Status: DC

## 2019-08-24 MED ORDER — BUSPIRONE HCL 10 MG PO TABS: ORAL_TABLET | ORAL | 0 refills | Status: DC

## 2019-08-24 NOTE — BH Assessment (Signed)
Comprehensive Clinical Assessment (CCA) Note  08/24/2019 Valerie Reilly [id number]  Visit Diagnosis:   No diagnosis found.    CCA Screening, Triage and Referral (STR)  Patient Reported Information How did you hear about [place]? Other (Comment)  Referral name: Beverly Sessions  Whom do you see for routine medical problems? I don't have a doctor  What Is the Reason for Your Visit/Call Today? almost of out lithium and buspar  How Long Has This Been Causing You Problems? 1 wk - 1 month  What Do You Feel Would Help You the Most Today? Medication   Have You Recently Been in Any Inpatient Treatment (Hospital/Detox/Crisis Center/28-Day Program)? No  Have You Ever Received Services From [employer] Before? Yes  Who Do You See at [facility]? no one currently   Have You Recently Had Any Thoughts About Hurting Yourself? No  Are You Planning to Commit Suicide/Harm Yourself At This time? No   Have you Recently Had Thoughts About [place]? No  Explanation: No data recorded  Have You Used Any Alcohol or Drugs in the Past 24 Hours? No  Do You Currently Have a Therapist/Psychiatrist? No   Have You Been Recently Discharged From Any Office Practice or Programs? No    CCA Screening Triage Referral Assessment Type of Contact: Face-to-Face  Is this Initial or Reassessment? No data recorded  If Minor and Not Living with Parent(s), Who has Custody? No data recorded Is CPS involved or ever been involved? Never  Is APS involved or ever been involved? Never   Patient Determined To Be At Risk for Harm To Self or Others Based on Review of Patient Reported Information or Presenting Complaint? No  Location of Assessment: [facility]   Does Patient Present under Involuntary Commitment? No  IVC Papers Initial File Date: No data recorded  [place] of Residence: [place]   Patient Currently Receiving the Following Services: No data recorded  Determination of Need:  Routine (7 days)   Options For Referral: Medication Management     CCA Biopsychosocial  Intake/Chief Complaint:  CCA Intake With Chief Complaint CCA Part Two Date: 08/24/19 CCA Part Two Time: 1433 Chief Complaint/Presenting Problem: pt is running low on lithium Patient's Currently Reported Symptoms/Problems: anxiety Individual's Strengths: assertive Type of Services Patient Feels Are Needed: medication management  Mental Health Symptoms Depression:  Depression: Irritability  Mania:  Mania: Irritability  Anxiety:   Anxiety: Irritability  Psychosis:  Psychosis: None  Trauma:  Trauma: N/A  Obsessions:  Obsessions: None  Compulsions:  Compulsions: None  Inattention:  Inattention: N/A  Hyperactivity/Impulsivity:  Hyperactivity/Impulsivity: N/A  Oppositional/Defiant Behaviors:  Oppositional/Defiant Behaviors: None  Emotional Irregularity:  Emotional Irregularity: None  Other Mood/Personality Symptoms:      Mental Status Exam Appearance and self-care  Stature:  Stature: Average  Weight:  Weight: Overweight  Clothing:  Clothing: Casual  Grooming:  Grooming: Normal  Cosmetic use:     Posture/gait:  Posture/Gait: Normal  Motor activity:  Motor Activity: Restless  Sensorium  Attention:  Attention: Persistent  Concentration:  Concentration: Normal  Orientation:  Orientation: X5  Recall/memory:  Recall/Memory: Normal  Affect and Mood  Affect:  Affect: Anxious  Mood:  Mood: Irritable  Relating  Eye contact:  Eye Contact: Normal  Facial expression:  Facial Expression: Tense  Attitude toward examiner:  Attitude Toward Examiner: Cooperative, Irritable  Thought and Language  Speech flow: Speech Flow: Clear and Coherent  Thought content:  Thought Content: Appropriate to Mood and Circumstances  Preoccupation:  Preoccupations: None  Hallucinations:  Hallucinations: None  Organization:     Transport planner of Knowledge:  Fund of Knowledge: Good  Intelligence:   Intelligence: Average  Abstraction:  Abstraction: Normal  Judgement:  Judgement: Fair  Art therapist:  Reality Testing: Adequate  Insight:  Insight: Fair  Decision Making:  Decision Making: Normal  Social Functioning  Social Maturity:  Social Maturity: Self-centered  Social Judgement:  Social Judgement: Normal  Stress  Stressors:  Stressors: Other (Comment) (almost out of medication)  Coping Ability:  Coping Ability: Overwhelmed  Skill Deficits:  Skill Deficits: None  Supports:  Supports: Family     Religion: Religion/Spirituality Are You A Religious Person?: No  Leisure/Recreation: Leisure / Recreation Do You Have Hobbies?: Yes Leisure and Hobbies: cook, photography  Exercise/Diet: Exercise/Diet Do You Exercise?: No Have You Gained or Lost A Significant Amount of Weight in the Past Six Months?: No Do You Follow a Special Diet?: No Do You Have Any Trouble Sleeping?: No (takes sleep meds)   CCA Employment/Education  Employment/Work Situation: Employment / Work Situation Employment situation: Employed 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 Patient's job has been impacted by current illness: No What is the longest time patient has a held a job?: 53yr Has patient ever been in the [employer]?: No  Education: Education Is Patient Currently Attending School?: No Did Teacher, adult education From [employer]?: Yes Did Physicist, medical?: Yes   CCA Family/Childhood History  Family and Relationship History: Family history Marital status: Divorced  Childhood History:  Childhood History Does patient have siblings?: Yes Number of Siblings: 1 Description of patient's current relationship with siblings: fair Did patient suffer any verbal/emotional/physical/sexual abuse as a child?: No Did patient suffer from severe childhood neglect?: No Has patient ever been sexually abused/assaulted/raped as an adolescent or adult?: No Was the patient ever a victim of a crime or a  disaster?: No Witnessed domestic violence?: No Has patient been affected by domestic violence as an adult?: No  Child/Adolescent Assessment:     CCA Substance Use  Alcohol/Drug Use: Alcohol / Drug Use Pain Medications: None per pt Prescriptions: lithium, History of alcohol / drug use?: No history of alcohol / drug abuse                         ASAM's:  Six Dimensions of Multidimensional Assessment  Dimension 1:  Acute Intoxication and/or Withdrawal Potential:      Dimension 2:  Biomedical Conditions and Complications:      Dimension 3:  Emotional, Behavioral, or Cognitive Conditions and Complications:     Dimension 4:  Readiness to Change:     Dimension 5:  Relapse, Continued use, or Continued Problem Potential:     Dimension 6:  Recovery/Living Environment:     ASAM Severity Score:    ASAM Recommended Level of Treatment:     Substance use Disorder (SUD)    Recommendations for Services/Supports/Treatments:    DSM5 Diagnoses: Patient Active Problem List   Diagnosis Date Noted  . Leukocytosis 04/06/2017  . Cannabis abuse, in remission 08/15/2014  . Bipolar I disorder, most recent episode (or current) unspecified 04/30/2014  . Borderline personality disorder ([place]) 04/30/2014    Patient Centered Plan: Patient is on the following Treatment Plan(s):  Anxiety   Referrals to Alternative Service(s): Referred to Alternative Service(s):   Place:   Date:   Time:    Referred to Alternative Service(s):   Place:   Date:  Time:    Referred to Alternative Service(s):   Place:   Date:   Time:    Referred to Alternative Service(s):   Place:   Date:   Time:     Marieanne Marxen Tora Perches

## 2019-08-24 NOTE — ED Notes (Signed)
Patient not happy with security checks.

## 2019-08-24 NOTE — ED Provider Notes (Signed)
Behavioral Health Medical Screening Exam  Valerie Reilly is a 44 y.o. female. Patient presents voluntarily to [facility] for walk-in assessment. Patient states "I'm not sure that you need to be here, I just want to get my lithium refilled." Patient assessed by nurse practitioner. Patient alert and oriented, answers appropriately. Patient denies suicidal ideations. Patient denies self-harm behaviors. Patient reports history of 3 suicide attempts "many years ago." Patient denies homicidal ideations. Patient denies auditory visual hallucinations. Patient denies symptoms of paranoia. Patient reports she resides in [place] with a friend. Patient denies access to weapons. Patient reports occasional use of "weed and alcohol." Patient denies need for outpatient substance use treatment resources. Patient reports she is currently employed with [employer].  Patient reports she does not have current outpatient psychiatry nor talk therapy. Patient will follow up with [facility] in July.  Total Time spent with patient: 30 minutes  Psychiatric Specialty Exam  Presentation  General Appearance:Appropriate for Environment;Fairly Groomed  Eye Contact:Good  Speech:Clear and Coherent;Normal Rate  Speech Volume:Normal  Handedness:Right   Mood and Affect  Mood:Euthymic  Affect:Appropriate;Congruent   Thought Process  Thought Processes:Coherent;Goal Directed  Descriptions of Associations:Intact  Orientation:Full (Time, Place and Person)  Thought Content:Logical  Hallucinations:None  Ideas of Reference:No data recorded Suicidal Thoughts:No  Homicidal Thoughts:No   Sensorium  Memory:Immediate Good;Recent Good  Judgment:Good  Insight:Good   Executive Functions  Concentration:Good  Attention Span:Good  Recall:Good  Fund of Knowledge:Good  Language:Good   Psychomotor Activity  Psychomotor  Activity:Normal   Assets  Assets:Communication Skills;Desire for Improvement;Financial Resources/Insurance;Housing;Intimacy;Leisure Time;Physical Health;Resilience;Social Support;Transportation;Vocational/Educational   Sleep  Sleep:Good  Number of hours: 8   Physical Exam: Physical Exam Vitals and nursing note reviewed.  Constitutional:      General: She is not in acute distress.    Appearance: She is well-developed.  HENT:     Head: Normocephalic and atraumatic.  Eyes:     Conjunctiva/sclera: Conjunctivae normal.  Cardiovascular:     Rate and Rhythm: Normal rate and regular rhythm.     Heart sounds: No murmur heard.   Pulmonary:     Effort: Pulmonary effort is normal. No respiratory distress.     Breath sounds: Normal breath sounds.  Abdominal:     Palpations: Abdomen is soft.     Tenderness: There is no abdominal tenderness.  Musculoskeletal:     Cervical back: Neck supple.  Skin:    General: Skin is warm and dry.  Neurological:     Mental Status: She is alert.  Psychiatric:        Attention and Perception: Attention and perception normal.        Mood and Affect: Mood and affect normal.        Speech: Speech normal.        Behavior: Behavior normal. Behavior is cooperative.        Thought Content: Thought content normal.        Cognition and Memory: Cognition normal.        Judgment: Judgment normal.    Review of Systems  Constitutional: Negative.   HENT: Negative.   Eyes: Negative.   Respiratory: Negative.   Cardiovascular: Negative.   Gastrointestinal: Negative.   Genitourinary: Negative.   Musculoskeletal: Negative.   Skin: Negative.   Neurological: Negative.   Endo/Heme/Allergies: Negative.   Psychiatric/Behavioral: Negative.    Blood pressure (!) 150/86, pulse 93, temperature 98.4 F (36.9 C), temperature source Oral, resp. rate 20, height 5\' 6"  (1.676  m), weight 238 lb (108 kg). Body mass index is 38.41 kg/m.  Musculoskeletal: Strength &  Muscle Tone: within normal limits Gait & Station: normal Patient leans: N/A   Recommendations: Patient discussed with Dr. Dwyane Dee. Patient will follow up with outpatient psychiatry. 37 days of prescriptions E prescribed to [facility] including, lithium, trazodone and BuSpar.   Based on my evaluation the patient does not appear to have an emergency medical condition.  Emmaline Kluver, FNP 08/24/2019, 3:57 PM

## 2019-08-24 NOTE — Discharge Instructions (Addendum)
Follow up with outpt psychiatrist appt with Dr. Toy Care at [facility] on September 26, 2019 at 1pm.  Prescriptions e-prescribed to [place] [employer].  Take all of you medications as prescribed by your mental healthcare provider.  Report any adverse effects and reactions from your medications to your outpatient provider promptly.  Do not engage in alcohol and or illegal drug use while on prescription medicines. Keep all scheduled appointments. This is to ensure that you are getting refills on time and to avoid any interruption in your medication.  If you are unable to keep an appointment call to reschedule.  Be sure to follow up with resources and follow ups given. In the event of worsening symptoms call the crisis hotline, 911, and or go to the nearest emergency department for appropriate evaluation and treatment of symptoms. Follow-up with your primary care provider for your medical issues, concerns and or health care needs.

## 2019-09-26 ENCOUNTER — Other Ambulatory Visit: Payer: Self-pay

## 2019-09-26 ENCOUNTER — Encounter (HOSPITAL_COMMUNITY): Payer: Self-pay | Admitting: Psychiatry

## 2019-09-26 ENCOUNTER — Ambulatory Visit (INDEPENDENT_AMBULATORY_CARE_PROVIDER_SITE_OTHER): Payer: No Payment, Other | Admitting: Psychiatry

## 2019-09-26 ENCOUNTER — Other Ambulatory Visit (HOSPITAL_COMMUNITY): Payer: Self-pay | Admitting: Psychiatry

## 2019-09-26 DIAGNOSIS — F319 Bipolar disorder, unspecified: Secondary | ICD-10-CM

## 2019-09-26 DIAGNOSIS — F99 Mental disorder, not otherwise specified: Secondary | ICD-10-CM

## 2019-09-26 DIAGNOSIS — F5105 Insomnia due to other mental disorder: Secondary | ICD-10-CM | POA: Insufficient documentation

## 2019-09-26 DIAGNOSIS — F411 Generalized anxiety disorder: Secondary | ICD-10-CM | POA: Insufficient documentation

## 2019-09-26 MED ORDER — BUSPIRONE HCL 30 MG PO TABS: 30.0000 mg | ORAL_TABLET | Freq: Two times a day (BID) | ORAL | 2 refills | Status: DC

## 2019-09-26 MED ORDER — LITHIUM CARBONATE 300 MG PO TABS: ORAL_TABLET | ORAL | 2 refills | Status: DC

## 2019-09-26 MED ORDER — CLONAZEPAM 0.5 MG PO TABS: 0.5000 mg | ORAL_TABLET | Freq: Every evening | ORAL | 2 refills | Status: DC | PRN

## 2019-09-26 MED ORDER — TRAZODONE HCL 150 MG PO TABS: 375.0000 mg | ORAL_TABLET | Freq: Every day | ORAL | 2 refills | Status: DC

## 2019-09-26 MED FILL — TRAZODONE HCL 150 MG TABS: 150 | 30 days supply | Qty: 75 | Fill #0

## 2019-09-26 MED FILL — busPIRone HCL 30 MG TABS: 30 | 30 days supply | Qty: 60 | Fill #0

## 2019-09-26 NOTE — Progress Notes (Signed)
Psychiatric Initial Adult Assessment   Patient Identification: Valerie Reilly MRN:  [id number] Date of Evaluation:  09/26/2019   Referral Source: [facility]  Chief Complaint:   " I just need my refills."  Visit Diagnosis:    ICD-10-CM   1. Bipolar I disorder (HCC)  F31.9 lithium 300 MG tablet    traZODone (DESYREL) 150 MG tablet  2. GAD (generalized anxiety disorder)  F41.1 busPIRone (BUSPAR) 30 MG tablet    clonazePAM (KLONOPIN) 0.5 MG tablet  3. Insomnia due to other mental disorder  F51.05 traZODone (DESYREL) 150 MG tablet   F99     History of Present Illness:  Patient presents in office today for initial psychiatric evaluation and establishing care.  Patient has a history of Bipolar 1 disorder, anxiety, and insomnia.  Patient reports that she is currently prescribed Lithium 600 mg BID, Trazodone 375 mg at bedtime, Klonopin 0.5 mg PRN at bedtime, and Buspar 30 mg BID.  Patient reports that her current medication regimen is effective in managing her symptoms and she currently needs more refills.   She denied any ongoing symptoms just of depression or mania.  She stated that she has been on several different medications and this combination is the one that works for her.  Patient appears to be too fixated on just continuing the same regimen and getting refills for the same.  Patient was offered referral to therapy.  Patient reports that she has tried both group and individual therapy in the past, but she is not interested in therapy at this time.  However, patient reports that she would like additional information about classes for sex addiction.  Patient made aware that there are no specific groups/ classes being offered in office at this time.  Patient was advised to check available online sources for more information about sex addiction classes and/or support groups.  Patient is agreeable with this suggestion and plan.  Patient denies any additional concerns at this time.  Patient was noted  to have active psoriatic lesions on her elbows of both arms.  Patient stated that she has long history of psoriasis which was diagnosed after she was started on lithium.  Writer informed her that lithium is known to worsen psoriasis.  Patient stated that she did not know that however she would still like to continue taking lithium as it has helped her mood immensely. She stated that she was getting treatment for psoriasis couple of years ago and was doing better when she had insurance however after she ran out of insurance she has not been able to get optimal treatment for the same. She asked if she could be referred to a primary care clinic and patient was given information about [facility].   Past Psychiatric History: Bipolar disorder, GAD, multiple psychiatric hospitalizations in the past. Also has hx of Cannabis abuse and excessive alcohol use in the past.  Previous Psychotropic Medications: Yes  Lithium, Prozac, Effexor, Wellbutrin,   Substance Abuse History in the last 12 months:  Yes.    Consequences of Substance Abuse: Negative  Past Medical History:  Past Medical History:  Diagnosis Date  . Bipolar disorder ([place])   . Diabetes mellitus   . Diabetes mellitus, type II ([place])   . GERD (gastroesophageal reflux disease)   . History of borderline personality disorder   . Hypercholesteremia   . Hypertension   . Psoriasis   . Seasonal allergies   . Thyroid disease     Past  Surgical History:  Procedure Laterality Date  . TONSILLECTOMY      Family Psychiatric History: see below  Family History:  Family History  Problem Relation Age of Onset  . Personality disorder Mother   . Alcohol abuse Maternal Uncle   . Depression Maternal Uncle     Social History:   Social History   Socioeconomic History  . Marital status: Divorced    Spouse name: Not on file  . Number of children: Not on file  . Years of education: Not on file  . Highest education  level: Not on file  Occupational History  . Not on file  Tobacco Use  . Smoking status: Current Every Day Smoker    Packs/day: 1.00    Types: Cigarettes, E-cigarettes  . Smokeless tobacco: Never Used  Vaping Use  . Vaping Use: Former  Substance and Sexual Activity  . Alcohol use: Yes    Alcohol/week: 0.0 standard drinks    Comment: occasional- a few times a year. 6-7 drinks per episdoe  . Drug use: No    Types: Marijuana    Comment: last time was 3 yrs ago  . Sexual activity: Not on file  Other Topics Concern  . Not on file  Social History Narrative  . Not on file   Social Determinants of Health   Financial Resource Strain:   . Difficulty of Paying Living Expenses:   Food Insecurity:   . Worried About Charity fundraiser in the Last Year:   . Arboriculturist in the Last Year:   Transportation Needs:   . Film/video editor (Medical):   Marland Kitchen Lack of Transportation (Non-Medical):   Physical Activity:   . Days of Exercise per Week:   . Minutes of Exercise per Session:   Stress:   . Feeling of Stress :   Social Connections:   . Frequency of Communication with Friends and Family:   . Frequency of Social Gatherings with Friends and Family:   . Attends Religious Services:   . Active Member of Clubs or Organizations:   . Attends Archivist Meetings:   Marland Kitchen Marital Status:     Additional Social History: Works for [employer]:  No Known Allergies  Metabolic Disorder Labs: No results found for: HGBA1C, MPG No results found for: PROLACTIN No results found for: CHOL, TRIG, HDL, CHOLHDL, VLDL, LDLCALC No results found for: TSH  Therapeutic Level Labs: No results found for: LITHIUM No results found for: CBMZ No results found for: VALPROATE  Current Medications: Current Outpatient Medications  Medication Sig Dispense Refill  . albuterol (PROVENTIL HFA;VENTOLIN HFA) 108 (90 BASE) MCG/ACT inhaler Inhale into the lungs every 6 (six) hours as needed for  wheezing or shortness of breath.    . benazepril (LOTENSIN) 20 MG tablet Take 20 mg by mouth daily.    . busPIRone (BUSPAR) 30 MG tablet Take 1 tablet (30 mg total) by mouth 2 (two) times daily. 60 tablet 2  . cetirizine (ZYRTEC) 10 MG tablet Take 10 mg by mouth daily.    . cholecalciferol (VITAMIN D) 1000 UNITS tablet Take 1,000 Units by mouth daily.    . clonazePAM (KLONOPIN) 0.5 MG tablet Take 1 tablet (0.5 mg total) by mouth at bedtime as needed for anxiety. 15 tablet 2  . fluticasone (FLONASE) 50 MCG/ACT nasal spray Place 2 sprays into both nostrils daily.    . Guselkumab (TREMFYA) 100 MG/ML SOPN Inject 100 mg into the skin every 8 (eight)  weeks.    . levothyroxine (SYNTHROID, LEVOTHROID) 50 MCG tablet Take 50 mcg by mouth daily before breakfast.    . lithium 300 MG tablet Take 2 capsules twice a day 120 tablet 2  . medroxyPROGESTERone (DEPO-PROVERA) 150 MG/ML injection Inject 150 mg into the muscle every 3 (three) months.    . metFORMIN (GLUCOPHAGE) 1000 MG tablet Take 750 mg by mouth daily with breakfast.     . montelukast (SINGULAIR) 10 MG tablet Take 10 mg by mouth at bedtime.    . Multiple Vitamin (MULTIVITAMIN) capsule Take 1 capsule by mouth daily.    . ranitidine (ZANTAC) 150 MG capsule Take 150 mg by mouth 2 (two) times daily.    . simvastatin (ZOCOR) 20 MG tablet Take 40 mg by mouth daily.     . traZODone (DESYREL) 150 MG tablet Take 2.5 tablets (375 mg total) by mouth at bedtime. 2 and 1/2 tablets 75 tablet 2   No current facility-administered medications for this visit.    Musculoskeletal: Strength & Muscle Tone: within normal limits Gait & Station: normal Patient leans: N/A  Psychiatric Specialty Exam: Review of Systems  There were no vitals taken for this visit.There is no height or weight on file to calculate BMI.  General Appearance: Disheveled  Eye Contact:  Good  Speech:  Clear and Coherent and Normal Rate  Volume:  Normal  Mood:  Euthymic  Affect:  Congruent   Thought Process:  Goal Directed and Descriptions of Associations: Intact  Orientation:  Full (Time, Place, and Person)  Thought Content:  Focused on med refills  Suicidal Thoughts:  No  Homicidal Thoughts:  No  Memory:  Immediate;   Good Recent;   Good  Judgement:  Fair  Insight:  Fair  Psychomotor Activity:  Normal  Concentration:  Concentration: Good and Attention Span: Good  Recall:  Good  Fund of Knowledge:Good  Language: Good  Akathisia:  Negative  Handed:  Right  AIMS (if indicated):  not done  Assets:  Communication Skills Desire for Improvement Financial Resources/Insurance Housing Social Support  ADL's:  Intact  Cognition: WNL  Sleep:  Good     Assessment and Plan: Patient repeatedly stated that she has been stable on her current regimen of lithium, trazodone, buspirone, as needed clonazepam and would like to continue to take the same.  Writer informed her that lithium can make psoriasis worse and patient stated that she would still like to continue taking Lithium as it has helped her mood symptoms immensely in the past.  1. Bipolar I disorder (HCC)  - lithium 300 MG tablet; Take 2 capsules twice a day  Dispense: 120 tablet; Refill: 2 - traZODone (DESYREL) 150 MG tablet; Take 2.5 tablets (375 mg total) by mouth at bedtime. 2 and 1/2 tablets  Dispense: 75 tablet; Refill: 2  2. GAD (generalized anxiety disorder)  - busPIRone (BUSPAR) 30 MG tablet; Take 1 tablet (30 mg total) by mouth 2 (two) times daily.  Dispense: 60 tablet; Refill: 2 - clonazePAM (KLONOPIN) 0.5 MG tablet; Take 1 tablet (0.5 mg total) by mouth at bedtime as needed for anxiety.  Dispense: 15 tablet; Refill: 2  3. Insomnia due to other mental disorder  - traZODone (DESYREL) 150 MG tablet; Take 2.5 tablets (375 mg total) by mouth at bedtime. 2 and 1/2 tablets  Dispense: 75 tablet; Refill: 2   Continue same medication regimen. Follow up in 3 months.   [place] Crane, MD 7/21/20211:31 PM

## 2019-09-27 MED FILL — LITHIUM CARBONATE 300 MG CA: 300 | 30 days supply | Qty: 120 | Fill #0

## 2019-12-04 ENCOUNTER — Encounter: Payer: Self-pay | Admitting: Nurse Practitioner

## 2019-12-04 ENCOUNTER — Ambulatory Visit: Payer: Self-pay | Attending: Nurse Practitioner | Admitting: Nurse Practitioner

## 2019-12-04 VITALS — BP 125/76 | HR 80 | Temp 97.7°F | Ht 67.0 in | Wt 220.0 lb

## 2019-12-04 DIAGNOSIS — E785 Hyperlipidemia, unspecified: Secondary | ICD-10-CM

## 2019-12-04 DIAGNOSIS — Z130 Encounter for screening for diseases of the blood and blood-forming organs and certain disorders involving the immune mechanism: Secondary | ICD-10-CM

## 2019-12-04 DIAGNOSIS — Z1231 Encounter for screening mammogram for malignant neoplasm of breast: Secondary | ICD-10-CM

## 2019-12-04 DIAGNOSIS — F319 Bipolar disorder, unspecified: Secondary | ICD-10-CM

## 2019-12-04 DIAGNOSIS — E119 Type 2 diabetes mellitus without complications: Secondary | ICD-10-CM

## 2019-12-04 DIAGNOSIS — Z1159 Encounter for screening for other viral diseases: Secondary | ICD-10-CM

## 2019-12-04 DIAGNOSIS — I10 Essential (primary) hypertension: Secondary | ICD-10-CM

## 2019-12-04 DIAGNOSIS — Z7251 High risk heterosexual behavior: Secondary | ICD-10-CM

## 2019-12-04 DIAGNOSIS — N898 Other specified noninflammatory disorders of vagina: Secondary | ICD-10-CM

## 2019-12-04 DIAGNOSIS — L400 Psoriasis vulgaris: Secondary | ICD-10-CM

## 2019-12-04 DIAGNOSIS — Z794 Long term (current) use of insulin: Secondary | ICD-10-CM

## 2019-12-04 DIAGNOSIS — Z114 Encounter for screening for human immunodeficiency virus [HIV]: Secondary | ICD-10-CM

## 2019-12-04 DIAGNOSIS — E039 Hypothyroidism, unspecified: Secondary | ICD-10-CM

## 2019-12-04 LAB — GLUCOSE, POCT (MANUAL RESULT ENTRY): POC Glucose: 171 mg/dl — AB (ref 70–99)

## 2019-12-04 LAB — POCT GLYCOSYLATED HEMOGLOBIN (HGB A1C): Hemoglobin A1C: 6.4 % — AB (ref 4.0–5.6)

## 2019-12-04 MED ORDER — LEVOTHYROXINE SODIUM 50 MCG PO TABS: 50.0000 ug | ORAL_TABLET | Freq: Every day | ORAL | 1 refills | Status: DC

## 2019-12-04 MED ORDER — BENAZEPRIL HCL 20 MG PO TABS: 20.0000 mg | ORAL_TABLET | Freq: Every day | ORAL | 1 refills | Status: DC

## 2019-12-04 MED ORDER — METFORMIN HCL ER 750 MG PO TB24: 750.0000 mg | ORAL_TABLET | Freq: Every day | ORAL | 0 refills | Status: DC

## 2019-12-04 MED ORDER — METFORMIN HCL ER (MOD) 1000 MG PO TB24: 1000.0000 mg | ORAL_TABLET | Freq: Every day | ORAL | 0 refills | Status: DC

## 2019-12-04 MED ORDER — TREMFYA 100 MG/ML ~~LOC~~ SOAJ: 100.0000 mg | SUBCUTANEOUS | 6 refills | Status: DC

## 2019-12-04 MED ORDER — SIMVASTATIN 40 MG PO TABS: 40.0000 mg | ORAL_TABLET | Freq: Every day | ORAL | 1 refills | Status: DC

## 2019-12-04 MED ORDER — GLIPIZIDE ER 10 MG PO TB24: 10.0000 mg | ORAL_TABLET | Freq: Every day | ORAL | 1 refills | Status: DC

## 2019-12-04 MED ORDER — TREMFYA 100 MG/ML ~~LOC~~ SOAJ
100.0000 mg | SUBCUTANEOUS | 6 refills | Status: DC
Start: 2019-12-04 — End: 2019-12-04

## 2019-12-04 NOTE — Progress Notes (Signed)
Assessment & Plan:  [name] was seen today for new patient (initial visit).  Diagnoses and all orders for this visit:  Type 2 diabetes mellitus without complication, with long-term current use of insulin (HCC) -     Glucose (CBG) -     HgB A1c -     CMP14+EGFR -     Lipid panel -     TSH -     glipiZIDE (GLUCOTROL XL) 10 MG 24 hr tablet; Take 1 tablet (10 mg total) by mouth daily with breakfast. -     metFORMIN (GLUMETZA) 1000 MG (MOD) 24 hr tablet; Take 1 tablet (1,000 mg total) by mouth daily with breakfast. Continue blood sugar control as discussed in office today, low carbohydrate diet, and regular physical exercise as tolerated, 150 minutes per week (30 min each day, 5 days per week, or 50 min 3 days per week). Keep blood sugar logs with fasting goal of 90-130 mg/dl, post prandial (after you eat) less than 180.  For Hypoglycemia: BS <60 and Hyperglycemia BS >400; contact the clinic ASAP. Annual eye exams and foot exams are recommended.   Plaque psoriasis -     Guselkumab (TREMFYA) 100 MG/ML SOPN; Inject 100 mg into the skin every 8 (eight) weeks.  Need for hepatitis C screening test -     Hepatitis C Antibody  Encounter for screening for HIV -     HIV antibody (with reflex)  High risk heterosexual behavior -     RPR  Vaginal odor -     Cervicovaginal ancillary only  Breast cancer screening by mammogram -     MM 3D SCREEN BREAST BILATERAL; Future  Screening for deficiency anemia -     CBC  Bipolar I disorder (HCC) -     Lithium level -     TSH  Hypothyroidism, unspecified type -     levothyroxine (SYNTHROID) 50 MCG tablet; Take 1 tablet (50 mcg total) by mouth daily before breakfast.  Essential hypertension -     benazepril (LOTENSIN) 20 MG tablet; Take 1 tablet (20 mg total) by mouth daily. Continue all antihypertensives as prescribed.  Remember to bring in your blood pressure log with you for your follow up appointment.  DASH/Mediterranean Diets are  healthier choices for HTN.    Dyslipidemia, goal LDL below 70 -     simvastatin (ZOCOR) 40 MG tablet; Take 1 tablet (40 mg total) by mouth daily. INSTRUCTIONS: Work on a low fat, heart healthy diet and participate in regular aerobic exercise program by working out at least 150 minutes per week; 5 days a week-30 minutes per day. Avoid red meat/beef/steak,  fried foods. junk foods, sodas, sugary drinks, unhealthy snacking, alcohol and smoking.  Drink at least 80 oz of water per day and monitor your carbohydrate intake daily.      Patient has been counseled on age-appropriate routine health concerns for screening and prevention. These are reviewed and up-to-date. Referrals have been placed accordingly. Immunizations are up-to-date or declined.    Subjective:   Chief Complaint  Patient presents with  . New Patient (Initial Visit)    Pt. is here to establish care.    [name] 44 y.o. female presents to office today to establish care.  Psoriasis States psoriasis developed after she started lithium many years ago. Prior to losing her insurance she was seeing a dermatologist and taking clobetasol and tremfya. She states her plaque psoriasis has improved significantly however since she lost her  insurance she has been unable to pay for the Tremfya. Requesting a prescription today so that she can contact the manufacturer of the medication and receive a discount.    DM TYPE 2 Well controlled. She is currently taking Metformin XR 1000 mg daily and glipizide 10 mg daily. Overdue for eye exam. Patient has been advised to apply for financial assistance and schedule to see our financial counselor.  Lab Results  Component Value Date   HGBA1C 6.4 (A) 12/04/2019   Hypothyroidism She has not been taking synthroid 50 mg daily as prescribed as she ran out of medication and was unable to get any additional refills as she was not able to afford to pay out of pocket to continue seeing her PCP.  Will refill today.   Vaginitis Endorses new onset Vaginal irritation with odor/discharge.   Essential Hypertension Taking lotensin 20 mg daily as prescribed. Well controlled. Denies chest pain, shortness of breath, palpitations, lightheadedness, dizziness, headaches or BLE edema.  BP Readings from Last 3 Encounters:  12/04/19 125/76  08/24/19 (!) 150/86  12/29/17 120/78   Bipolar Disorder She sees a therapist for Bipolar disorder 1, anxiety and insomnia. Requesting lithium levels be drawn with her labs as she takes this medication for her mood disorder. She also takes trazodone, buspar and klonopin. 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 The exam room smells of marijuana today. Previous medications tried: prozac, Brewing technologist and wellbutrin.   EYE problem Endorses right eye discomfort but no pain. Denies any injury, drainage from eye or visual disturbances.    Review of Systems  Constitutional: Negative for fever, malaise/fatigue and weight loss.  HENT: Negative.  Negative for nosebleeds.   Eyes: Positive for pain (right). Negative for blurred vision, double vision, photophobia, discharge and redness.  Respiratory: Negative.  Negative for cough and shortness of breath.   Cardiovascular: Negative.  Negative for chest pain, palpitations and leg swelling.  Gastrointestinal: Negative.  Negative for heartburn, nausea and vomiting.  Musculoskeletal: Negative.  Negative for myalgias.  Skin: Positive for rash.  Neurological: Negative.  Negative for dizziness, focal weakness, seizures and headaches.  Psychiatric/Behavioral: Negative for suicidal ideas. The patient is nervous/anxious and has insomnia.        SEE HPI    Past Medical History:  Diagnosis Date  . Asthma   . Bipolar disorder ([place])   . Diabetes mellitus   . Diabetes mellitus, type II ([place])   . GERD (gastroesophageal reflux disease)   . History of borderline  personality disorder   . Hypercholesteremia   . Hypertension   . Psoriasis   . Psoriasis   . Seasonal allergies   . Thyroid disease     Past Surgical History:  Procedure Laterality Date  . TONSILLECTOMY    . TUBAL LIGATION      Family History  Problem Relation Age of Onset  . Personality disorder Mother   . Alcohol abuse Maternal Uncle   . Depression Maternal Uncle     Social History Reviewed with no changes to be made today.   Outpatient Medications Prior to Visit  Medication Sig Dispense Refill  . albuterol (PROVENTIL HFA;VENTOLIN HFA) 108 (90 BASE) MCG/ACT inhaler Inhale into the lungs every 6 (six) hours as needed for wheezing or shortness of breath.    . busPIRone (BUSPAR) 30 MG tablet Take 1 tablet (30 mg total) by mouth 2 (two) times daily. 60 tablet 2  .  cetirizine (ZYRTEC) 10 MG tablet Take 10 mg by mouth daily.    . cholecalciferol (VITAMIN D) 1000 UNITS tablet Take 1,000 Units by mouth daily.    . clonazePAM (KLONOPIN) 0.5 MG tablet Take 1 tablet (0.5 mg total) by mouth at bedtime as needed for anxiety. 15 tablet 2  . Famotidine (PEPCID PO) Take by mouth.    . fluticasone (FLONASE) 50 MCG/ACT nasal spray Place 2 sprays into both nostrils daily.    Marland Kitchen lithium 300 MG tablet Take 2 capsules twice a day 120 tablet 2  . Multiple Vitamin (MULTIVITAMIN) capsule Take 1 capsule by mouth daily.    . traZODone (DESYREL) 150 MG tablet Take 2.5 tablets (375 mg total) by mouth at bedtime. 2 and 1/2 tablets 75 tablet 2  . benazepril (LOTENSIN) 20 MG tablet Take 20 mg by mouth daily.    Marland Kitchen glipiZIDE (GLUCOTROL XL) 10 MG 24 hr tablet Take 10 mg by mouth daily with breakfast.    . metFORMIN (GLUCOPHAGE) 1000 MG tablet Take 750 mg by mouth daily with breakfast.     . simvastatin (ZOCOR) 20 MG tablet Take 40 mg by mouth daily.     . Guselkumab (TREMFYA) 100 MG/ML SOPN Inject 100 mg into the skin every 8 (eight) weeks. (Patient not taking: Reported on 12/04/2019)    . levothyroxine  (SYNTHROID, LEVOTHROID) 50 MCG tablet Take 50 mcg by mouth daily before breakfast. (Patient not taking: Reported on 12/04/2019)    . medroxyPROGESTERone (DEPO-PROVERA) 150 MG/ML injection Inject 150 mg into the muscle every 3 (three) months. (Patient not taking: Reported on 12/04/2019)    . montelukast (SINGULAIR) 10 MG tablet Take 10 mg by mouth at bedtime. (Patient not taking: Reported on 12/04/2019)    . ranitidine (ZANTAC) 150 MG capsule Take 150 mg by mouth 2 (two) times daily. (Patient not taking: Reported on 12/04/2019)     No facility-administered medications prior to visit.    No Known Allergies     Objective:    BP 125/76 (BP Location: Right Arm, Patient Position: Sitting, Cuff Size: Normal)   Pulse 80   Temp 97.7 F (36.5 C) (Temporal)   Ht 5' 7" (1.702 m)   Wt 220 lb (99.8 kg)   LMP 10/28/2019   SpO2 96%   BMI 34.46 kg/m  Wt Readings from Last 3 Encounters:  12/04/19 220 lb (99.8 kg)  08/24/19 238 lb (108 kg)  12/29/17 244 lb (110.7 kg)    Physical Exam Vitals and nursing note reviewed.  Constitutional:      Appearance: She is well-developed.  HENT:     Head: Normocephalic and atraumatic.  Eyes:     General: No scleral icterus.       Right eye: No foreign body, discharge or hordeolum.        Left eye: No foreign body, discharge or hordeolum.     Extraocular Movements:     Right eye: Normal extraocular motion and no nystagmus.     Left eye: Normal extraocular motion and no nystagmus.     Conjunctiva/sclera:     Right eye: Right conjunctiva is not injected. No chemosis, exudate or hemorrhage.    Left eye: Left conjunctiva is not injected. No chemosis, exudate or hemorrhage.  Cardiovascular:     Rate and Rhythm: Normal rate and regular rhythm.     Heart sounds: Normal heart sounds. No murmur heard.  No friction rub. No gallop.   Pulmonary:     Effort: Pulmonary effort is  normal. No tachypnea or respiratory distress.     Breath sounds: Normal breath sounds. No  decreased breath sounds, wheezing, rhonchi or rales.  Chest:     Chest wall: No tenderness.  Abdominal:     General: Bowel sounds are normal.     Palpations: Abdomen is soft.  Musculoskeletal:        General: Normal range of motion.     Cervical back: Normal range of motion.  Skin:    General: Skin is warm and dry.     Findings: Rash present. Rash is crusting and macular.          Comments: Plaque psoriasis on bilateral posterior forearms  Neurological:     Mental Status: She is alert and oriented to person, place, and time.     Coordination: Coordination normal.  Psychiatric:        Behavior: Behavior normal. Behavior is cooperative.        Thought Content: Thought content normal.        Judgment: Judgment normal.          Patient has been counseled extensively about nutrition and exercise as well as the importance of adherence with medications and regular follow-up. The patient was given clear instructions to go to ER or return to medical center if symptoms don't improve, worsen or new problems develop. The patient verbalized understanding.   Follow-up: Return for 4 weeks repeat TSH. See me in 3 months. Gildardo Pounds, FNP-BC [facility] [place], [place]   12/04/2019, 9:32 PM

## 2019-12-05 LAB — CMP14+EGFR
ALT: 13 IU/L (ref 0–32)
AST: 13 IU/L (ref 0–40)
Albumin/Globulin Ratio: 2 (ref 1.2–2.2)
Albumin: 4.7 g/dL (ref 3.8–4.8)
Alkaline Phosphatase: 79 IU/L (ref 44–121)
BUN/Creatinine Ratio: 11 (ref 9–23)
BUN: 8 mg/dL (ref 6–24)
Bilirubin Total: 0.6 mg/dL (ref 0.0–1.2)
CO2: 23 mmol/L (ref 20–29)
Calcium: 10.5 mg/dL — ABNORMAL HIGH (ref 8.7–10.2)
Chloride: 100 mmol/L (ref 96–106)
Creatinine, Ser: 0.7 mg/dL (ref 0.57–1.00)
GFR calc Af Amer: 120 mL/min/{1.73_m2} (ref >59)
GFR calc non Af Amer: 104 mL/min/{1.73_m2} (ref >59)
Globulin, Total: 2.3 g/dL (ref 1.5–4.5)
Glucose: 130 mg/dL — ABNORMAL HIGH (ref 65–99)
Potassium: 4.4 mmol/L (ref 3.5–5.2)
Sodium: 137 mmol/L (ref 134–144)
Total Protein: 7 g/dL (ref 6.0–8.5)

## 2019-12-05 LAB — CERVICOVAGINAL ANCILLARY ONLY
Bacterial Vaginitis (gardnerella): NEGATIVE
Candida Glabrata: NEGATIVE
Candida Vaginitis: NEGATIVE
Chlamydia: NEGATIVE
Comment: NEGATIVE
Comment: NEGATIVE
Comment: NEGATIVE
Comment: NEGATIVE
Comment: NEGATIVE
Comment: NORMAL
Neisseria Gonorrhea: NEGATIVE
Trichomonas: NEGATIVE

## 2019-12-05 LAB — LIPID PANEL
Chol/HDL Ratio: 5.5 ratio — ABNORMAL HIGH (ref 0.0–4.4)
Cholesterol, Total: 219 mg/dL — ABNORMAL HIGH (ref 100–199)
HDL: 40 mg/dL (ref >39)
LDL Chol Calc (NIH): 132 mg/dL — ABNORMAL HIGH (ref 0–99)
Triglycerides: 265 mg/dL — ABNORMAL HIGH (ref 0–149)
VLDL Cholesterol Cal: 47 mg/dL — ABNORMAL HIGH (ref 5–40)

## 2019-12-05 LAB — CBC
Hematocrit: 52.2 % — ABNORMAL HIGH (ref 34.0–46.6)
Hemoglobin: 17.7 g/dL — ABNORMAL HIGH (ref 11.1–15.9)
MCH: 31.8 pg (ref 26.6–33.0)
MCHC: 33.9 g/dL (ref 31.5–35.7)
MCV: 94 fL (ref 79–97)
Platelets: 307 10*3/uL (ref 150–450)
RBC: 5.56 x10E6/uL — ABNORMAL HIGH (ref 3.77–5.28)
RDW: 12.2 % (ref 11.7–15.4)
WBC: 13.1 10*3/uL — ABNORMAL HIGH (ref 3.4–10.8)

## 2019-12-05 LAB — HEPATITIS C ANTIBODY: Hep C Virus Ab: <0.1 s/co ratio (ref 0.0–0.9)

## 2019-12-05 LAB — HIV ANTIBODY (ROUTINE TESTING W REFLEX): HIV Screen 4th Generation wRfx: NONREACTIVE

## 2019-12-05 LAB — LITHIUM LEVEL: Lithium Lvl: 0.8 mmol/L (ref 0.5–1.2)

## 2019-12-05 LAB — RPR: RPR Ser Ql: NONREACTIVE

## 2019-12-05 LAB — TSH: TSH: 1.87 u[IU]/mL (ref 0.450–4.500)

## 2019-12-07 ENCOUNTER — Telehealth: Payer: Self-pay | Admitting: Nurse Practitioner

## 2019-12-07 MED FILL — busPIRone HCL 30 MG TABS: 30 | 30 days supply | Qty: 60 | Fill #0

## 2019-12-07 NOTE — Telephone Encounter (Signed)
Copied from [place] [id number]. Topic: Quick Communication - Lab Results (Clinic Use ONLY) >> Dec 07, 2019 10:32 AM Lennox Solders wrote: Pt is calling and would like blood work etc results from 12/03/2019

## 2019-12-10 ENCOUNTER — Telehealth: Payer: Self-pay | Admitting: Nurse Practitioner

## 2019-12-10 NOTE — Telephone Encounter (Signed)
Patient is calling back for her lab results. Please advise CB- [phone]

## 2019-12-10 NOTE — Telephone Encounter (Signed)
Copied from [place] [phone]. Topic: Quick Communication - Lab Results (Clinic Use ONLY) >> Dec 10, 2019  2:25 PM Carilyn Goodpasture, RN wrote: Called patient to inform them of  lab results. When patient returns call, triage nurse may disclose results. >> Dec 10, 2019  4:15 PM Leward Quan A wrote: Patient would like a call back this afternoon with her lab results please can be reached at Ph# [phone]

## 2019-12-11 NOTE — Telephone Encounter (Signed)
See lab result note; pt. Spoke to a nurse today at 3:33 PM.

## 2019-12-17 ENCOUNTER — Telehealth: Payer: Self-pay | Admitting: Nurse Practitioner

## 2019-12-17 MED FILL — busPIRone HCL 30 MG TABS: 30 | 30 days supply | Qty: 60 | Fill #0

## 2019-12-17 NOTE — Telephone Encounter (Signed)
Patient came in to drop off paperwork to be filled out by PCP. Patient states that the medication is shipped to her home. Once the forms are completed and faxed she would like a call from the nurse. Please f/u.

## 2019-12-19 ENCOUNTER — Telehealth: Payer: Self-pay

## 2019-12-19 NOTE — Telephone Encounter (Signed)
   Copied from [name] [id number]. Topic: General - Other >> Dec 19, 2019  3:31 PM Valerie Reilly wrote: Reason for CRM: Pt stated she is trying to complete the financial form for assistance but she has some questions. Pt stated she does not want to complete all the paperwork if it is not going to be accepted. Pt requests call back.

## 2019-12-20 ENCOUNTER — Other Ambulatory Visit: Payer: Self-pay | Admitting: Nurse Practitioner

## 2019-12-20 ENCOUNTER — Telehealth: Payer: Self-pay | Admitting: Nurse Practitioner

## 2019-12-20 MED ORDER — METFORMIN HCL 1000 MG PO TABS: 1000.0000 mg | ORAL_TABLET | Freq: Every day | ORAL | 0 refills | Status: DC

## 2019-12-20 NOTE — Telephone Encounter (Signed)
Copied from [place] [phone]. Topic: General - Other >> Dec 19, 2019  3:38 PM Yvette Rack wrote: Reason for CRM: Pt stated her Rx for metformin is not correct. Pt stated the Rx should not be for extended release. Pt requests that the Rx for metformin be corrected and sent to her pharmacy

## 2019-12-20 NOTE — Telephone Encounter (Signed)
SENT 

## 2019-12-24 ENCOUNTER — Telehealth (HOSPITAL_COMMUNITY): Payer: No Payment, Other | Admitting: Psychiatry

## 2019-12-25 ENCOUNTER — Telehealth (INDEPENDENT_AMBULATORY_CARE_PROVIDER_SITE_OTHER): Payer: No Payment, Other | Admitting: Psychiatry

## 2019-12-25 ENCOUNTER — Encounter (HOSPITAL_COMMUNITY): Payer: Self-pay | Admitting: Psychiatry

## 2019-12-25 ENCOUNTER — Other Ambulatory Visit: Payer: Self-pay | Admitting: Psychiatry

## 2019-12-25 ENCOUNTER — Telehealth: Payer: Self-pay | Admitting: Nurse Practitioner

## 2019-12-25 ENCOUNTER — Ambulatory Visit: Payer: Self-pay

## 2019-12-25 ENCOUNTER — Other Ambulatory Visit: Payer: Self-pay

## 2019-12-25 DIAGNOSIS — F319 Bipolar disorder, unspecified: Secondary | ICD-10-CM

## 2019-12-25 DIAGNOSIS — F5105 Insomnia due to other mental disorder: Secondary | ICD-10-CM

## 2019-12-25 DIAGNOSIS — F411 Generalized anxiety disorder: Secondary | ICD-10-CM

## 2019-12-25 DIAGNOSIS — F99 Mental disorder, not otherwise specified: Secondary | ICD-10-CM

## 2019-12-25 MED ORDER — CLONAZEPAM 0.5 MG PO TABS: 0.5000 mg | ORAL_TABLET | Freq: Every evening | ORAL | 2 refills | Status: DC | PRN

## 2019-12-25 MED ORDER — BUSPIRONE HCL 30 MG PO TABS: 30.0000 mg | ORAL_TABLET | Freq: Two times a day (BID) | ORAL | 2 refills | Status: DC

## 2019-12-25 MED ORDER — LITHIUM CARBONATE 300 MG PO TABS: ORAL_TABLET | ORAL | 2 refills | Status: DC

## 2019-12-25 MED ORDER — TRAZODONE HCL 150 MG PO TABS: 375.0000 mg | ORAL_TABLET | Freq: Every day | ORAL | 2 refills | Status: DC

## 2019-12-25 MED FILL — TRAZODONE HCL 150 MG TABS: 150 | 30 days supply | Qty: 75 | Fill #0

## 2019-12-25 MED FILL — LITHIUM CARBONATE 300 MG CA: 300 | 30 days supply | Qty: 120 | Fill #0

## 2019-12-25 NOTE — Progress Notes (Signed)
BH MD/PA/NP OP Progress Note  Virtual Visit via Video Note  I connected with Valerie Reilly on 12/25/19 at  1:20 PM EDT by a video enabled telemedicine application and verified that I am speaking with the correct person using two identifiers.  Location: Patient: Home Provider: Clinic   I discussed the limitations of evaluation and management by telemedicine and the availability of in person appointments. The patient expressed understanding and agreed to proceed.  I provided 15 minutes of non-face-to-face time during this encounter.     12/25/2019 1:21 PM Emmarose Klinke Reilly  MRN:  [id number]  Chief Complaint:  " Everything is going well."   HPI: Patient reported that she is doing well.  She informed that everything is okay for now.  She informed she does not have a job currently but she is looking for something.  She informed that she is taking Klonopin more frequently than usual lately because she has been feeling anxious about loss of her boyfriend.  Other than that overall things are going well and she is sleeping well at night with the help of trazodone BuSpar.  Visit Diagnosis:    ICD-10-CM   1. Bipolar I disorder ([place])  F31.9   2. GAD (generalized anxiety disorder)  F41.1   3. Insomnia due to other mental disorder  F51.05    F99     Past Psychiatric History: Bipolar disorder, GAD, insomnia  Past Medical History:  Past Medical History:  Diagnosis Date  . Asthma   . Bipolar disorder ([place])   . Diabetes mellitus   . Diabetes mellitus, type II ([place])   . GERD (gastroesophageal reflux disease)   . History of borderline personality disorder   . Hypercholesteremia   . Hypertension   . Psoriasis   . Psoriasis   . Seasonal allergies   . Thyroid disease     Past Surgical History:  Procedure Laterality Date  . TONSILLECTOMY    . TUBAL LIGATION      Family Psychiatric History: see below  Family History:  Family History  Problem Relation Age of Onset  .  Personality disorder Mother   . Alcohol abuse Maternal Uncle   . Depression Maternal Uncle     Social History:  Social History   Socioeconomic History  . Marital status: Divorced    Spouse name: Not on file  . Number of children: Not on file  . Years of education: Not on file  . Highest education level: Not on file  Occupational History  . Not on file  Tobacco Use  . Smoking status: Current Every Day Smoker    Packs/day: 1.00    Types: Cigarettes, E-cigarettes  . Smokeless tobacco: Never Used  Vaping Use  . Vaping Use: Former  Substance and Sexual Activity  . Alcohol use: Yes    Alcohol/week: 0.0 standard drinks    Comment: occasional- a few times a year. 6-7 drinks per episdoe  . Drug use: No    Types: Marijuana    Comment: last time was 3 yrs ago  . Sexual activity: Not Currently  Other Topics Concern  . Not on file  Social History Narrative  . Not on file   Social Determinants of Health   Financial Resource Strain:   . Difficulty of Paying Living Expenses: Not on file  Food Insecurity:   . Worried About Charity fundraiser in the Last Year: Not on file  . Ran Out of Food in the Last Year: Not on  file  Transportation Needs:   . Film/video editor (Medical): Not on file  . Lack of Transportation (Non-Medical): Not on file  Physical Activity:   . Days of Exercise per Week: Not on file  . Minutes of Exercise per Session: Not on file  Stress:   . Feeling of Stress : Not on file  Social Connections:   . Frequency of Communication with Friends and Family: Not on file  . Frequency of Social Gatherings with Friends and Family: Not on file  . Attends Religious Services: Not on file  . Active Member of Clubs or Organizations: Not on file  . Attends Archivist Meetings: Not on file  . Marital Status: Not on file    Allergies: No Known Allergies  Metabolic Disorder Labs: Lab Results  Component Value Date   HGBA1C 6.4 (A) 12/04/2019   No results  found for: PROLACTIN Lab Results  Component Value Date   CHOL 219 (H) 12/04/2019   TRIG 265 (H) 12/04/2019   HDL 40 12/04/2019   CHOLHDL 5.5 (H) 12/04/2019   LDLCALC 132 (H) 12/04/2019   Lab Results  Component Value Date   TSH 1.870 12/04/2019    Therapeutic Level Labs: Lab Results  Component Value Date   LITHIUM 0.8 12/04/2019   No results found for: VALPROATE No components found for:  CBMZ  Current Medications: Current Outpatient Medications  Medication Sig Dispense Refill  . albuterol (PROVENTIL HFA;VENTOLIN HFA) 108 (90 BASE) MCG/ACT inhaler Inhale into the lungs every 6 (six) hours as needed for wheezing or shortness of breath.    . benazepril (LOTENSIN) 20 MG tablet Take 1 tablet (20 mg total) by mouth daily. 90 tablet 1  . busPIRone (BUSPAR) 30 MG tablet Take 1 tablet (30 mg total) by mouth 2 (two) times daily. 60 tablet 2  . cetirizine (ZYRTEC) 10 MG tablet Take 10 mg by mouth daily.    . cholecalciferol (VITAMIN D) 1000 UNITS tablet Take 1,000 Units by mouth daily.    . clonazePAM (KLONOPIN) 0.5 MG tablet Take 1 tablet (0.5 mg total) by mouth at bedtime as needed for anxiety. 15 tablet 2  . Famotidine (PEPCID PO) Take by mouth.    . fluticasone (FLONASE) 50 MCG/ACT nasal spray Place 2 sprays into both nostrils daily.    Marland Kitchen glipiZIDE (GLUCOTROL XL) 10 MG 24 hr tablet Take 1 tablet (10 mg total) by mouth daily with breakfast. 90 tablet 1  . Guselkumab (TREMFYA) 100 MG/ML SOPN Inject 100 mg into the skin every 8 (eight) weeks. 1 mL 6  . levothyroxine (SYNTHROID) 50 MCG tablet Take 1 tablet (50 mcg total) by mouth daily before breakfast. 30 tablet 1  . lithium 300 MG tablet Take 2 capsules twice a day 120 tablet 2  . metFORMIN (GLUCOPHAGE) 1000 MG tablet Take 1 tablet (1,000 mg total) by mouth daily with breakfast. 90 tablet 0  . Multiple Vitamin (MULTIVITAMIN) capsule Take 1 capsule by mouth daily.    . simvastatin (ZOCOR) 40 MG tablet Take 1 tablet (40 mg total) by mouth  daily. 90 tablet 1  . traZODone (DESYREL) 150 MG tablet Take 2.5 tablets (375 mg total) by mouth at bedtime. 2 and 1/2 tablets 75 tablet 2   No current facility-administered medications for this visit.      Psychiatric Specialty Exam: Review of Systems  There were no vitals taken for this visit.There is no height or weight on file to calculate BMI.  General Appearance: Fairly Groomed  Eye Contact:  Good  Speech:  Clear and Coherent and Normal Rate  Volume:  Normal  Mood:  Euthymic  Affect:  Congruent  Thought Process:  Goal Directed and Descriptions of Associations: Intact  Orientation:  Full (Time, Place, and Person)  Thought Content: Logical   Suicidal Thoughts:  No  Homicidal Thoughts:  No  Memory:  Immediate;   Good Recent;   Good  Judgement:  Fair  Insight:  Fair  Psychomotor Activity:  Normal  Concentration:  Concentration: Good and Attention Span: Good  Recall:  Good  Fund of Knowledge: Good  Language: Good  Akathisia:  Negative  Handed:  Right  AIMS (if indicated): not done  Assets:  Communication Skills Desire for Improvement Financial Resources/Insurance Housing  ADL's:  Intact  Cognition: WNL  Sleep:  Good    Assessment and Plan: Patient appears to be stable on current regimen.  1. Bipolar I disorder (HCC)  - lithium 300 MG tablet; Take 2 capsules twice a day  Dispense: 120 tablet; Refill: 2 - traZODone (DESYREL) 150 MG tablet; Take 2.5 tablets (375 mg total) by mouth at bedtime. 2 and 1/2 tablets  Dispense: 75 tablet; Refill: 2  2. GAD (generalized anxiety disorder)  - clonazePAM (KLONOPIN) 0.5 MG tablet; Take 1 tablet (0.5 mg total) by mouth at bedtime as needed for anxiety.  Dispense: 20 tablet; Refill: 2 - busPIRone (BUSPAR) 30 MG tablet; Take 1 tablet (30 mg total) by mouth 2 (two) times daily.  Dispense: 60 tablet; Refill: 2  3. Insomnia due to other mental disorder  - traZODone (DESYREL) 150 MG tablet; Take 2.5 tablets (375 mg total) by mouth  at bedtime. 2 and 1/2 tablets  Dispense: 75 tablet; Refill: 2  Continue same medication regimen. Follow up in 3 months.  [place] Crane, MD 12/25/2019, 1:21 PM

## 2019-12-25 NOTE — Telephone Encounter (Signed)
Copied from [place] [id number]. Topic: General - Other >> Dec 25, 2019  9:27 AM Rainey Pines A wrote: Pharmacy wants to know if Raul Del can write prescription for metformin for 1000mg  regular instead of extended release due to costs for patient. Please advise

## 2019-12-26 NOTE — Telephone Encounter (Signed)
Pt is out of metformin and would like a refill today

## 2019-12-27 MED ORDER — METFORMIN HCL 1000 MG PO TABS: 1000.0000 mg | ORAL_TABLET | Freq: Every day | ORAL | 0 refills | Status: DC

## 2019-12-27 NOTE — Telephone Encounter (Signed)
CMA will resend the Metformin.  Spoke to pharmacy and they stated they did not receive the Rx for 12/20/2019 Metformin 1000mg .

## 2019-12-27 NOTE — Telephone Encounter (Signed)
Received the form. Will fax it once completed and will call patient.

## 2020-01-07 ENCOUNTER — Telehealth: Payer: Self-pay

## 2020-01-07 ENCOUNTER — Other Ambulatory Visit: Payer: Self-pay | Admitting: Nurse Practitioner

## 2020-01-07 ENCOUNTER — Other Ambulatory Visit: Payer: Self-pay

## 2020-01-07 ENCOUNTER — Ambulatory Visit: Payer: Self-pay | Attending: Nurse Practitioner

## 2020-01-07 DIAGNOSIS — Z114 Encounter for screening for human immunodeficiency virus [HIV]: Secondary | ICD-10-CM

## 2020-01-07 DIAGNOSIS — D72829 Elevated white blood cell count, unspecified: Secondary | ICD-10-CM

## 2020-01-07 DIAGNOSIS — N760 Acute vaginitis: Secondary | ICD-10-CM

## 2020-01-07 NOTE — Telephone Encounter (Signed)
Patient came into clinic for blood work and wanted to check on the status of her [place] injection for Psoriasis forms. Patient states she dropped the forms a few weeks ago but has not heard anything yet. Patient is having a flare up and would like to have the forms ASAP to have the injection done.  Please advise [phone]

## 2020-01-08 ENCOUNTER — Other Ambulatory Visit: Payer: Self-pay

## 2020-01-08 DIAGNOSIS — N760 Acute vaginitis: Secondary | ICD-10-CM

## 2020-01-08 LAB — CBC WITH DIFFERENTIAL/PLATELET
Basophils Absolute: 0.1 10*3/uL (ref 0.0–0.2)
Basos: 1 %
EOS (ABSOLUTE): 0.3 10*3/uL (ref 0.0–0.4)
Eos: 2 %
Hematocrit: 44 % (ref 34.0–46.6)
Hemoglobin: 15.3 g/dL (ref 11.1–15.9)
Immature Grans (Abs): 0.1 10*3/uL (ref 0.0–0.1)
Immature Granulocytes: 1 %
Lymphocytes Absolute: 2.7 10*3/uL (ref 0.7–3.1)
Lymphs: 23 %
MCH: 33.1 pg — ABNORMAL HIGH (ref 26.6–33.0)
MCHC: 34.8 g/dL (ref 31.5–35.7)
MCV: 95 fL (ref 79–97)
Monocytes Absolute: 0.5 10*3/uL (ref 0.1–0.9)
Monocytes: 4 %
Neutrophils Absolute: 8.1 10*3/uL — ABNORMAL HIGH (ref 1.4–7.0)
Neutrophils: 69 %
Platelets: 291 10*3/uL (ref 150–450)
RBC: 4.62 x10E6/uL (ref 3.77–5.28)
RDW: 12.4 % (ref 11.7–15.4)
WBC: 11.8 10*3/uL — ABNORMAL HIGH (ref 3.4–10.8)

## 2020-01-08 LAB — HIV ANTIBODY (ROUTINE TESTING W REFLEX): HIV Screen 4th Generation wRfx: NONREACTIVE

## 2020-01-09 ENCOUNTER — Other Ambulatory Visit: Payer: Self-pay | Admitting: Nurse Practitioner

## 2020-01-09 LAB — CERVICOVAGINAL ANCILLARY ONLY
Bacterial Vaginitis (gardnerella): POSITIVE — AB
Candida Glabrata: NEGATIVE
Candida Vaginitis: NEGATIVE
Chlamydia: NEGATIVE
Comment: NEGATIVE
Comment: NEGATIVE
Comment: NEGATIVE
Comment: NEGATIVE
Comment: NEGATIVE
Comment: NORMAL
Neisseria Gonorrhea: NEGATIVE
Trichomonas: NEGATIVE

## 2020-01-09 MED ORDER — METRONIDAZOLE 500 MG PO TABS
500.0000 mg | ORAL_TABLET | Freq: Two times a day (BID) | ORAL | 0 refills | Status: AC
Start: 2020-01-09 — End: 2020-01-16

## 2020-01-10 ENCOUNTER — Telehealth: Payer: Self-pay | Admitting: Nurse Practitioner

## 2020-01-10 NOTE — Telephone Encounter (Signed)
Spoke to patient and informed on results w/ Rx. Pt. Understood.

## 2020-01-10 NOTE — Telephone Encounter (Signed)
Copied from [place] [phone]. Topic: General - Inquiry >> Jan 10, 2020 11:08 AM Valerie Reilly wrote: Reason for CRM: Pt called to speak with nurse about lab results and why she was sent in an antibiotic/ please advise

## 2020-01-13 ENCOUNTER — Other Ambulatory Visit: Payer: Self-pay | Admitting: Nurse Practitioner

## 2020-01-13 DIAGNOSIS — L400 Psoriasis vulgaris: Secondary | ICD-10-CM

## 2020-01-13 MED ORDER — TREMFYA 100 MG/ML ~~LOC~~ SOAJ: SUBCUTANEOUS | 6 refills | Status: DC

## 2020-01-21 ENCOUNTER — Other Ambulatory Visit: Payer: Self-pay | Admitting: Obstetrics and Gynecology

## 2020-01-21 DIAGNOSIS — Z1231 Encounter for screening mammogram for malignant neoplasm of breast: Secondary | ICD-10-CM

## 2020-02-04 MED FILL — LITHIUM CARBONATE 300 MG CA: 300 | 30 days supply | Qty: 120 | Fill #0

## 2020-02-04 MED FILL — busPIRone HCL 30 MG TABS: 30 | 30 days supply | Qty: 60 | Fill #1

## 2020-02-05 NOTE — Telephone Encounter (Addendum)
Patient checking on the status of message below, patient would like a follow up call as soon possible best # [phone].. patient would like injection ship to her and not office, patient will administer.

## 2020-02-06 ENCOUNTER — Other Ambulatory Visit: Payer: Self-pay | Admitting: Nurse Practitioner

## 2020-02-06 DIAGNOSIS — E039 Hypothyroidism, unspecified: Secondary | ICD-10-CM

## 2020-02-06 NOTE — Telephone Encounter (Signed)
Spoke to patient and patient aware--per [employer] they have received the application, application looks to be complete but not processed as of yet.  I told patient I would call again on Friday if I hadn't heard anything before than and keep her updated.

## 2020-02-06 NOTE — Telephone Encounter (Signed)
The application was submitted on 01/23/2020 and I have not received an approval or denial as of yet.  I will try to call Wynetta Emery and Wynetta Emery, but it is usually about a 2 hour wait time...Marland Kitchen patient may call to check on status as well [phone].  Once I am able to get in touch with Wynetta Emery and Wynetta Emery I can call her back with an update.   Initially the application was submitted on 01/17/20 and when I called to follow-up they stated they hadn't received it, so I refaxed the application on 09/81/19 to 2 different fax #'s per [employer]'s request.

## 2020-02-08 ENCOUNTER — Telehealth: Payer: Self-pay

## 2020-02-08 NOTE — Telephone Encounter (Signed)
Patient was approved for patient assistance for Tremfya until 02/07/21.  Can you send in a script for it so we can dispense it to the patient at $0?

## 2020-02-12 ENCOUNTER — Other Ambulatory Visit: Payer: Self-pay | Admitting: Nurse Practitioner

## 2020-02-12 ENCOUNTER — Telehealth: Payer: Self-pay

## 2020-02-12 DIAGNOSIS — L400 Psoriasis vulgaris: Secondary | ICD-10-CM

## 2020-02-12 MED ORDER — TREMFYA 100 MG/ML ~~LOC~~ SOAJ: SUBCUTANEOUS | 6 refills | Status: DC

## 2020-02-12 NOTE — Telephone Encounter (Signed)
Rx sent to [facility] pharmacy.

## 2020-02-13 MED FILL — TREMFYA 100 MG/ML SYRINGE: 100 | 56 days supply | Qty: 2 | Fill #0

## 2020-03-04 ENCOUNTER — Ambulatory Visit: Payer: Self-pay | Admitting: Nurse Practitioner

## 2020-03-11 ENCOUNTER — Ambulatory Visit
Admission: RE | Admit: 2020-03-11 | Discharge: 2020-03-11 | Disposition: A | Discharge status: Home / Self Care | Payer: No Typology Code available for payment source | Source: Ambulatory Visit | Attending: Obstetrics and Gynecology | Admitting: Obstetrics and Gynecology

## 2020-03-11 ENCOUNTER — Other Ambulatory Visit: Payer: Self-pay

## 2020-03-11 ENCOUNTER — Ambulatory Visit: Payer: Self-pay | Admitting: *Deleted

## 2020-03-11 VITALS — BP 126/80 | Wt 225.6 lb

## 2020-03-11 DIAGNOSIS — Z1231 Encounter for screening mammogram for malignant neoplasm of breast: Secondary | ICD-10-CM

## 2020-03-11 DIAGNOSIS — Z1239 Encounter for other screening for malignant neoplasm of breast: Secondary | ICD-10-CM

## 2020-03-11 NOTE — Patient Instructions (Addendum)
Explained breast self awareness with Valerie Reilly. Patient did not need a Pap smear today due to last Pap smear was 02/10/2018. Let her know [facility] will cover Pap smears every 3 years unless has a history of abnormal Pap smears. Referred patient to the [facility] for a screening mammogram on the mobile unit. Appointment scheduled Tuesday, March 11, 2020 at 1500. Patient escorted to the mobile unit following B[facility]CP appointment for her screening mammogram. Let patient know the Breast Center will follow up with her within the next couple weeks with results of her mammogram by letter or phone. Discussed smoking cessation with patient. Referred to the [facility] Quitline and gave resources to the free [facility] cessation classes at [facility]. [name] verbalized understanding.  [name], Kathaleen Maser, RN 2:48 PM

## 2020-03-11 NOTE — Progress Notes (Signed)
[name] is a 45 y.o. female who presents to [facility] clinic today with no complaints.    Pap Smear: Pap smear not completed today. Last Pap smear was 02/10/2018 at [facility] clinic and was normal. Per patient has no history of an abnormal Pap smear. Last Pap smear result is available in Epic.   Physical exam: Breasts Breasts symmetrical. No skin abnormalities bilateral breasts. No nipple retraction bilateral breasts. No nipple discharge bilateral breasts. No lymphadenopathy. No lumps palpated bilateral breasts. No complaints of pain or tenderness on exam.       Pelvic/Bimanual Pap is not indicated today per BCCCP guidelines.   Smoking History: Patient is a current smoker. Discussed smoking cessation with patient. Referred to the [facility] Quitline and gave resources to the free smoking cessation classes at [facility].   Patient Navigation: Patient education provided. Access to services provided for patient through [facility]CP program.    Breast and Cervical Cancer Risk Assessment: Patient does not have family history of breast cancer, known genetic mutations, or radiation treatment to the chest before age 58. Patient does not have history of cervical dysplasia, immunocompromised, or DES exposure in-utero.  Risk Assessment    Risk Scores      03/11/2020   Last edited by: Narda Rutherford, LPN   5-year risk: 0.6 %   Lifetime risk: 6.9 %          A: B[facility]CP exam without pap smear No complaints.  P: Referred patient to the [facility] for a screening mammogram on the mobile unit. Appointment scheduled Tuesday, March 11, 2020 at 1500.  Priscille Heidelberg, RN 03/11/2020 2:48 PM

## 2020-03-19 ENCOUNTER — Other Ambulatory Visit: Payer: Self-pay

## 2020-03-19 ENCOUNTER — Other Ambulatory Visit (HOSPITAL_COMMUNITY): Payer: Self-pay | Admitting: Psychiatry

## 2020-03-19 ENCOUNTER — Telehealth (INDEPENDENT_AMBULATORY_CARE_PROVIDER_SITE_OTHER): Payer: No Payment, Other | Admitting: Psychiatry

## 2020-03-19 ENCOUNTER — Encounter (HOSPITAL_COMMUNITY): Payer: Self-pay | Admitting: Psychiatry

## 2020-03-19 DIAGNOSIS — F5105 Insomnia due to other mental disorder: Secondary | ICD-10-CM | POA: Diagnosis not present

## 2020-03-19 DIAGNOSIS — F99 Mental disorder, not otherwise specified: Secondary | ICD-10-CM

## 2020-03-19 DIAGNOSIS — F319 Bipolar disorder, unspecified: Secondary | ICD-10-CM

## 2020-03-19 DIAGNOSIS — F411 Generalized anxiety disorder: Secondary | ICD-10-CM | POA: Diagnosis not present

## 2020-03-19 MED ORDER — LITHIUM CARBONATE 300 MG PO TABS
ORAL_TABLET | ORAL | 2 refills | Status: DC
Start: 2020-03-19 — End: 2020-03-19

## 2020-03-19 MED ORDER — BUSPIRONE HCL 30 MG PO TABS: 30.0000 mg | ORAL_TABLET | Freq: Two times a day (BID) | ORAL | 2 refills | Status: DC

## 2020-03-19 MED ORDER — TRAZODONE HCL 150 MG PO TABS
375.0000 mg | ORAL_TABLET | Freq: Every day | ORAL | 2 refills | Status: DC
Start: 2020-03-19 — End: 2020-03-19

## 2020-03-19 MED FILL — TRAZODONE HCL 150 MG TABS: 150 | 30 days supply | Qty: 75 | Fill #0

## 2020-03-19 MED FILL — busPIRone HCL 30 MG TABS: 30 | 30 days supply | Qty: 60 | Fill #0

## 2020-03-19 MED FILL — LITHIUM CARBONATE 300 MG TA: 300 | 30 days supply | Qty: 120 | Fill #0

## 2020-03-19 NOTE — Progress Notes (Signed)
BH MD/PA/NP OP Progress Note  Virtual Visit via Video Note  I connected with Valerie Reilly on 03/19/20 at  1:20 PM EST by a video enabled telemedicine application and verified that I am speaking with the correct person using two identifiers.  Location: Patient: Home Provider: Clinic   I discussed the limitations of evaluation and management by telemedicine and the availability of in person appointments. The patient expressed understanding and agreed to proceed.  I provided 14 minutes of non-face-to-face time during this encounter.     03/19/2020 1:40 PM Valerie Reilly  MRN:  [id number]  Chief Complaint:  " I am doing great."   HPI: Patient follows she is doing well.  She is working for [employer] now.  She stated that she had a good Christmas.  She denied any significant events in the recent past.  She stated that her medications are helpful and her mood has been stable. She informed that she has not used clonazepam in a while and he does not need any more refills for now. She will request for refills for [facility]  Visit Diagnosis:    ICD-10-CM   1. Bipolar I disorder ([place])  F31.9   2. GAD (generalized anxiety disorder)  F41.1   3. Insomnia due to other mental disorder  F51.05    F99     Past Psychiatric History: Bipolar disorder, GAD, insomnia  Past Medical History:  Past Medical History:  Diagnosis Date  . Asthma   . Bipolar disorder ([place])   . Diabetes mellitus   . Diabetes mellitus, type II ([place])   . GERD (gastroesophageal reflux disease)   . History of borderline personality disorder   . Hypercholesteremia   . Hypertension   . Psoriasis   . Psoriasis   . Seasonal allergies   . Thyroid disease     Past Surgical History:  Procedure Laterality Date  . TONSILLECTOMY    . TUBAL LIGATION      Family Psychiatric History: see below  Family History:  Family History  Problem Relation Age of  Onset  . Personality disorder Mother   . Alcohol abuse Maternal Uncle   . Depression Maternal Uncle     Social History:  Social History   Socioeconomic History  . Marital status: Divorced    Spouse name: Not on file  . Number of children: 1  . Years of education: Not on file  . Highest education level: Associate degree: occupational, Hotel manager, or vocational program  Occupational History  . Not on file  Tobacco Use  . Smoking status: Current Every Day Smoker    Packs/day: 1.00    Types: Cigarettes, E-cigarettes  . Smokeless tobacco: Never Used  Vaping Use  . Vaping Use: Former  Substance and Sexual Activity  . Alcohol use: Yes    Alcohol/week: 0.0 standard drinks    Comment: occasional- a few times a year. 6-7 drinks per episdoe  . Drug use: No    Types: Marijuana    Comment: last time was 3 yrs ago  . Sexual activity: Yes    Birth control/protection: Surgical  Other Topics Concern  . Not on file  Social History Narrative  . Not on file   Social Determinants of Health   Financial Resource Strain: Not on file  Food Insecurity: Not on file  Transportation Needs: No Transportation Needs  . Lack of Transportation (Medical): No  . Lack of Transportation (Non-Medical):  No  Physical Activity: Not on file  Stress: Not on file  Social Connections: Not on file    Allergies: No Known Allergies  Metabolic Disorder Labs: Lab Results  Component Value Date   HGBA1C 6.4 (A) 12/04/2019   No results found for: PROLACTIN Lab Results  Component Value Date   CHOL 219 (H) 12/04/2019   TRIG 265 (H) 12/04/2019   HDL 40 12/04/2019   CHOLHDL 5.5 (H) 12/04/2019   LDLCALC 132 (H) 12/04/2019   Lab Results  Component Value Date   TSH 1.870 12/04/2019    Therapeutic Level Labs: Lab Results  Component Value Date   LITHIUM 0.8 12/04/2019   No results found for: VALPROATE No components found for:  CBMZ  Current Medications: Current Outpatient Medications  Medication Sig  Dispense Refill  . albuterol (PROVENTIL HFA;VENTOLIN HFA) 108 (90 BASE) MCG/ACT inhaler Inhale into the lungs every 6 (six) hours as needed for wheezing or shortness of breath.    . benazepril (LOTENSIN) 20 MG tablet Take 1 tablet (20 mg total) by mouth daily. 90 tablet 1  . busPIRone (BUSPAR) 30 MG tablet Take 1 tablet (30 mg total) by mouth 2 (two) times daily. 60 tablet 2  . cetirizine (ZYRTEC) 10 MG tablet Take 10 mg by mouth daily.    . cholecalciferol (VITAMIN D) 1000 UNITS tablet Take 1,000 Units by mouth daily.    . clonazePAM (KLONOPIN) 0.5 MG tablet Take 1 tablet (0.5 mg total) by mouth at bedtime as needed for anxiety. 20 tablet 2  . EUTHYROX 50 MCG tablet TAKE 1 TABLET BY MOUTH ONCE DAILY BEFORE BREAKFAST 90 tablet 2  . Famotidine (PEPCID PO) Take by mouth.    . fluticasone (FLONASE) 50 MCG/ACT nasal spray Place 2 sprays into both nostrils daily.    Marland Kitchen glipiZIDE (GLUCOTROL XL) 10 MG 24 hr tablet Take 1 tablet (10 mg total) by mouth daily with breakfast. 90 tablet 1  . Guselkumab (TREMFYA) 100 MG/ML SOPN Inject 100mg /69ml into the skin at week 0 and 4 and then 8 weeks thereafter. 3 mL 6  . lithium 300 MG tablet Take 2 capsules twice a day 120 tablet 2  . metFORMIN (GLUCOPHAGE) 1000 MG tablet Take 1 tablet (1,000 mg total) by mouth daily with breakfast. 90 tablet 0  . Multiple Vitamin (MULTIVITAMIN) capsule Take 1 capsule by mouth daily.    . simvastatin (ZOCOR) 40 MG tablet Take 1 tablet (40 mg total) by mouth daily. 90 tablet 1  . traZODone (DESYREL) 150 MG tablet Take 2.5 tablets (375 mg total) by mouth at bedtime. 2 and 1/2 tablets 75 tablet 2   No current facility-administered medications for this visit.      Psychiatric Specialty Exam: Review of Systems  Last menstrual period 03/11/2020.There is no height or weight on file to calculate BMI.  General Appearance: Fairly Groomed  Eye Contact:  Good  Speech:  Clear and Coherent and Normal Rate  Volume:  Normal  Mood:  Euthymic   Affect:  Congruent  Thought Process:  Goal Directed and Descriptions of Associations: Intact  Orientation:  Full (Time, Place, and Person)  Thought Content: Logical   Suicidal Thoughts:  No  Homicidal Thoughts:  No  Memory:  Immediate;   Good Recent;   Good  Judgement:  Fair  Insight:  Fair  Psychomotor Activity:  Normal  Concentration:  Concentration: Good and Attention Span: Good  Recall:  Good  Fund of Knowledge: Good  Language: Good  Akathisia:  Negative  Handed:  Right  AIMS (if indicated): not done  Assets:  Communication Skills Desire for Improvement Financial Resources/Insurance Housing  ADL's:  Intact  Cognition: WNL  Sleep:  Good    Assessment and Plan: Patient is doing well on her regimen for now.  1. Bipolar I disorder (HCC)  - lithium 300 MG tablet; Take 2 capsules twice a day  Dispense: 120 tablet; Refill: 2 - traZODone (DESYREL) 150 MG tablet; Take 2.5 tablets (375 mg total) by mouth at bedtime. 2 and 1/2 tablets  Dispense: 75 tablet; Refill: 2  2. GAD (generalized anxiety disorder)  - clonazePAM (KLONOPIN) 0.5 MG tablet; Take 1 tablet (0.5 mg total) by mouth at bedtime as needed for anxiety.  Dispense: 20 tablet; Refill: 2 - busPIRone (BUSPAR) 30 MG tablet; Take 1 tablet (30 mg total) by mouth 2 (two) times daily.  Dispense: 60 tablet; Refill: 2  3. Insomnia due to other mental disorder  - traZODone (DESYREL) 150 MG tablet; Take 2.5 tablets (375 mg total) by mouth at bedtime. 2 and 1/2 tablets  Dispense: 75 tablet; Refill: 2  Continue same medication regimen. Follow up in 3 months.  [place] Crane, MD 03/19/2020, 1:40 PM

## 2020-03-23 ENCOUNTER — Other Ambulatory Visit: Payer: Self-pay | Admitting: Nurse Practitioner

## 2020-04-03 MED FILL — LITHIUM CARBONATE 300 MG CA: 300 | 30 days supply | Qty: 120 | Fill #1

## 2020-04-03 MED FILL — busPIRone HCL 30 MG TABS: 30 | 30 days supply | Qty: 60 | Fill #2

## 2020-04-04 ENCOUNTER — Other Ambulatory Visit: Payer: Self-pay | Admitting: Nurse Practitioner

## 2020-04-04 ENCOUNTER — Encounter: Payer: Self-pay | Admitting: Nurse Practitioner

## 2020-04-04 ENCOUNTER — Other Ambulatory Visit: Payer: Self-pay

## 2020-04-04 ENCOUNTER — Ambulatory Visit: Payer: Self-pay | Attending: Nurse Practitioner | Admitting: Nurse Practitioner

## 2020-04-04 DIAGNOSIS — J309 Allergic rhinitis, unspecified: Secondary | ICD-10-CM

## 2020-04-04 DIAGNOSIS — I10 Essential (primary) hypertension: Secondary | ICD-10-CM

## 2020-04-04 DIAGNOSIS — Z7251 High risk heterosexual behavior: Secondary | ICD-10-CM

## 2020-04-04 DIAGNOSIS — F17200 Nicotine dependence, unspecified, uncomplicated: Secondary | ICD-10-CM

## 2020-04-04 DIAGNOSIS — E039 Hypothyroidism, unspecified: Secondary | ICD-10-CM

## 2020-04-04 MED ORDER — FLUTICASONE PROPIONATE 50 MCG/ACT NA SUSP
2.0000 | Freq: Every day | NASAL | 1 refills | Status: DC
Start: 2020-04-04 — End: 2020-04-04

## 2020-04-04 MED ORDER — NICOTINE 21 MG/24HR TD PT24: 21.0000 mg | MEDICATED_PATCH | Freq: Every day | TRANSDERMAL | 0 refills | Status: DC

## 2020-04-04 MED FILL — FLUTICASONE PROP 50 MCG SPR: 50 | 60 days supply | Qty: 16 | Fill #0

## 2020-04-04 MED FILL — NICOTINE 21 MG/24HR PATCH: 21 | 24 days supply | Qty: 42 | Fill #0

## 2020-04-04 NOTE — Progress Notes (Signed)
Virtual Visit via Telephone Note Due to national recommendations of social distancing due to [place] 19, telehealth visit is felt to be most appropriate for this patient at this time.  I discussed the limitations, risks, security and privacy concerns of performing an evaluation and management service by telephone and the availability of in person appointments. I also discussed with the patient that there may be a patient responsible charge related to this service. The patient expressed understanding and agreed to proceed.    I connected with Valerie Reilly on 04/09/20  at   9:10 AM EST  EDT by telephone and verified that I am speaking with the correct person using two identifiers.   Consent I discussed the limitations, risks, security and privacy concerns of performing an evaluation and management service by telephone and the availability of in person appointments. I also discussed with the patient that there may be a patient responsible charge related to this service. The patient expressed understanding and agreed to proceed.   Location of Patient: Private  Residence   Location of Provider: [place] and [place] participating in Telemedicine visit: Geryl Rankins FNP-BC [place]    History of Present Illness: Telemedicine visit for: Follow Up  She has a past medical history of Asthma, Bipolar disorder,  Diabetes mellitus, type II, GERD, History of borderline personality disorder, Hypercholesteremia, Hypertension, Psoriasis, Seasonal allergies, and Thyroid disease.  Ready to quit smoking. She is agreeable to trying nicotine patches.   Hypothyroidism Taking levothyroxine 50 mg daily as prescribed. Denies any symptoms of hypo or hypothyroidism Lab Results  Component Value Date   TSH 1.350 04/08/2020    Essential Hypertension  Well controlled. Taking benazepril 20 mg daily. Denies chest pain, shortness of breath, palpitations,  lightheadedness, dizziness, headaches or BLE edema.  BP Readings from Last 3 Encounters:  03/11/20 126/80  12/04/19 125/76  04/06/17 131/83      Past Medical History:  Diagnosis Date  . Asthma   . Bipolar disorder ([place])   . Diabetes mellitus   . Diabetes mellitus, type II ([place])   . GERD (gastroesophageal reflux disease)   . History of borderline personality disorder   . Hypercholesteremia   . Hypertension   . Psoriasis   . Psoriasis   . Seasonal allergies   . Thyroid disease     Past Surgical History:  Procedure Laterality Date  . TONSILLECTOMY    . TUBAL LIGATION      Family History  Problem Relation Age of Onset  . Personality disorder Mother   . Alcohol abuse Maternal Uncle   . Depression Maternal Uncle     Social History   Socioeconomic History  . Marital status: Divorced    Spouse name: Not on file  . Number of children: 1  . Years of education: Not on file  . Highest education level: Associate degree: occupational, Hotel manager, or vocational program  Occupational History  . Not on file  Tobacco Use  . Smoking status: Current Every Day Smoker    Packs/day: 1.00    Types: Cigarettes, E-cigarettes  . Smokeless tobacco: Never Used  Vaping Use  . Vaping Use: Former  Substance and Sexual Activity  . Alcohol use: Yes    Alcohol/week: 0.0 standard drinks    Comment: occasional- a few times a year. 6-7 drinks per episdoe  . Drug use: No    Types: Marijuana    Comment: last time was 3 yrs ago  .  Sexual activity: Yes    Birth control/protection: Surgical  Other Topics Concern  . Not on file  Social History Narrative  . Not on file   Social Determinants of Health   Financial Resource Strain: Not on file  Food Insecurity: Not on file  Transportation Needs: No Transportation Needs  . Lack of Transportation (Medical): No  . Lack of Transportation (Non-Medical): No  Physical Activity: Not on file  Stress: Not on file  Social Connections: Not on file      Observations/Objective: Awake, alert and oriented x 3   Review of Systems  Constitutional: Negative for fever, malaise/fatigue and weight loss.  HENT: Positive for congestion. Negative for nosebleeds.   Eyes: Negative.  Negative for blurred vision, double vision and photophobia.  Respiratory: Negative.  Negative for cough and shortness of breath.   Cardiovascular: Negative.  Negative for chest pain, palpitations and leg swelling.  Gastrointestinal: Negative.  Negative for heartburn, nausea and vomiting.  Genitourinary:       Vaginitis  Musculoskeletal: Negative.  Negative for myalgias.  Neurological: Negative.  Negative for dizziness, focal weakness, seizures and headaches.  Psychiatric/Behavioral: Negative.  Negative for suicidal ideas.    Assessment and Plan: Valerie Reilly was seen today for follow-up.  Diagnoses and all orders for this visit:  Hypothyroidism, unspecified type -     TSH; Future  Essential hypertension Continue all antihypertensives as prescribed.  Remember to bring in your blood pressure log with you for your follow up appointment.  DASH/Mediterranean Diets are healthier choices for HTN.    High risk heterosexual behavior -     RPR; Future -     HIV antibody (with reflex); Future -     Cervicovaginal ancillary only; Future  Tobacco dependence -     nicotine (NICODERM CQ - DOSED IN MG/24 HOURS) 21 mg/24hr patch; Place 1 patch (21 mg total) onto the skin daily.  Allergic rhinitis, unspecified seasonality, unspecified trigger -     fluticasone (FLONASE) 50 MCG/ACT nasal spray; Place 2 sprays into both nostrils daily.     Follow Up Instructions Return in about 3 months (around 07/03/2020).     I discussed the assessment and treatment plan with the patient. The patient was provided an opportunity to ask questions and all were answered. The patient agreed with the plan and demonstrated an understanding of the instructions.   The patient was advised to call  back or seek an in-person evaluation if the symptoms worsen or if the condition fails to improve as anticipated.  I provided 14 minutes of non-face-to-face time during this encounter including median intraservice time, reviewing previous notes, labs, imaging, medications and explaining diagnosis and management.  Gildardo Pounds, FNP-BC

## 2020-04-08 ENCOUNTER — Other Ambulatory Visit: Payer: Self-pay

## 2020-04-08 ENCOUNTER — Ambulatory Visit: Payer: No Typology Code available for payment source

## 2020-04-08 ENCOUNTER — Ambulatory Visit: Payer: Self-pay | Attending: Nurse Practitioner

## 2020-04-08 DIAGNOSIS — E039 Hypothyroidism, unspecified: Secondary | ICD-10-CM

## 2020-04-08 DIAGNOSIS — Z7251 High risk heterosexual behavior: Secondary | ICD-10-CM

## 2020-04-08 DIAGNOSIS — Z23 Encounter for immunization: Secondary | ICD-10-CM

## 2020-04-09 ENCOUNTER — Other Ambulatory Visit: Payer: Self-pay | Admitting: Nurse Practitioner

## 2020-04-09 ENCOUNTER — Encounter: Payer: Self-pay | Admitting: Nurse Practitioner

## 2020-04-09 DIAGNOSIS — E039 Hypothyroidism, unspecified: Secondary | ICD-10-CM

## 2020-04-09 LAB — CERVICOVAGINAL ANCILLARY ONLY
Bacterial Vaginitis (gardnerella): POSITIVE — AB
Candida Glabrata: NEGATIVE
Candida Vaginitis: NEGATIVE
Chlamydia: NEGATIVE
Comment: NEGATIVE
Comment: NEGATIVE
Comment: NEGATIVE
Comment: NEGATIVE
Comment: NEGATIVE
Comment: NORMAL
Neisseria Gonorrhea: NEGATIVE
Trichomonas: NEGATIVE

## 2020-04-09 LAB — TSH: TSH: 1.35 u[IU]/mL (ref 0.450–4.500)

## 2020-04-09 LAB — HIV ANTIBODY (ROUTINE TESTING W REFLEX): HIV Screen 4th Generation wRfx: NONREACTIVE

## 2020-04-09 LAB — RPR: RPR Ser Ql: NONREACTIVE

## 2020-04-09 MED ORDER — LEVOTHYROXINE SODIUM 50 MCG PO TABS: ORAL_TABLET | ORAL | 0 refills | Status: DC

## 2020-04-09 MED ORDER — METRONIDAZOLE 500 MG PO TABS: 500.0000 mg | ORAL_TABLET | Freq: Two times a day (BID) | ORAL | 0 refills | Status: DC

## 2020-04-10 MED FILL — metroNIDAZOLE 500 MG TABS: 500 | 7 days supply | Qty: 14 | Fill #0

## 2020-04-11 ENCOUNTER — Telehealth: Payer: Self-pay | Admitting: Nurse Practitioner

## 2020-04-11 NOTE — Telephone Encounter (Signed)
Spoke to patient and informed on lab results.

## 2020-04-11 NOTE — Telephone Encounter (Signed)
CMA spoke to patient and informed on lab results.  

## 2020-04-11 NOTE — Telephone Encounter (Signed)
Patient is calling for her lab results. Patient is calling to discuss the results. Pt is aware that a script was sent for Flagyl but she does not know what it was sent for. Pt would like to know was medication for bacteria or for STD? CB- [phone]

## 2020-04-11 NOTE — Telephone Encounter (Signed)
Copied from [place] [id number]. Topic: General - Call Back - No Documentation >> Apr 10, 2020  2:13 PM Erick Blinks wrote: Reason for CRM: Pt called back and would like to speak to a nurse about her medications/lab work. Has questions  Best contact: [phone]  Sorry Thought I was going to be able to add this to the already existing encounter about this issue.

## 2020-04-28 MED FILL — TREMFYA 100 MG/ML SYRINGE: 100 | 56 days supply | Qty: 2 | Fill #1

## 2020-05-05 MED FILL — TRAZODONE HCL 150 MG TABS: 150 | 30 days supply | Qty: 75 | Fill #1

## 2020-06-02 MED FILL — busPIRone HCL 30 MG TABS: 30 | 30 days supply | Qty: 60 | Fill #0

## 2020-06-02 MED FILL — LITHIUM CARBONATE 300 MG CA: 300 | 30 days supply | Qty: 120 | Fill #2

## 2020-06-03 ENCOUNTER — Ambulatory Visit: Payer: Self-pay | Attending: Nurse Practitioner | Admitting: Nurse Practitioner

## 2020-06-03 ENCOUNTER — Other Ambulatory Visit: Payer: Self-pay

## 2020-06-03 ENCOUNTER — Encounter: Payer: Self-pay | Admitting: Nurse Practitioner

## 2020-06-03 DIAGNOSIS — E785 Hyperlipidemia, unspecified: Secondary | ICD-10-CM

## 2020-06-03 DIAGNOSIS — E119 Type 2 diabetes mellitus without complications: Secondary | ICD-10-CM

## 2020-06-03 DIAGNOSIS — Z1211 Encounter for screening for malignant neoplasm of colon: Secondary | ICD-10-CM

## 2020-06-03 DIAGNOSIS — Z7251 High risk heterosexual behavior: Secondary | ICD-10-CM

## 2020-06-03 DIAGNOSIS — D72829 Elevated white blood cell count, unspecified: Secondary | ICD-10-CM

## 2020-06-03 DIAGNOSIS — Z794 Long term (current) use of insulin: Secondary | ICD-10-CM

## 2020-06-03 NOTE — Progress Notes (Signed)
Virtual Visit via Telephone Note Due to national recommendations of social distancing due to [place] 19, telehealth visit is felt to be most appropriate for this patient at this time.  I discussed the limitations, risks, security and privacy concerns of performing an evaluation and management service by telephone and the availability of in person appointments. I also discussed with the patient that there may be a patient responsible charge related to this service. The patient expressed understanding and agreed to proceed.    I connected with Valerie Reilly on 06/03/20  at   8:30 AM EDT  EDT by telephone and verified that I am speaking with the correct person using two identifiers.   Consent I discussed the limitations, risks, security and privacy concerns of performing an evaluation and management service by telephone and the availability of in person appointments. I also discussed with the patient that there may be a patient responsible charge related to this service. The patient expressed understanding and agreed to proceed.   Location of Patient: Private Residence   Location of Provider: [place] and [place] participating in Telemedicine visit: Geryl Rankins FNP-BC [place]    History of Present Illness: Telemedicine visit for: Follow up She has a past medical history of Asthma, Bipolar disorder, Diabetes mellitus, type II, GERD, History of borderline personality disorder, Hypercholesteremia, Hypertension, Psoriasis,  Seasonal allergies, and Thyroid disease.  Mammogram:UTD PAP smear: Overdue She is requesting a full panel for STD testing .  DM 2  Well controlled with glipizide 10 mg daily. She does not monitor her blood glucose levels daily. She does not endorse any symptoms of hypo or hyperglycemia.  Lab Results  Component Value Date   HGBA1C 6.4 (A) 12/04/2019    Essential Hypertension Well controlled with benazepril  20 mg daily. Denies chest pain, shortness of breath, palpitations, lightheadedness, dizziness, headaches or BLE edema.  BP Readings from Last 3 Encounters:  03/11/20 126/80  12/04/19 125/76  04/06/17 131/83    Past Medical History:  Diagnosis Date  . Asthma   . Bipolar disorder ([place])   . Diabetes mellitus   . Diabetes mellitus, type II ([place])   . GERD (gastroesophageal reflux disease)   . History of borderline personality disorder   . Hypercholesteremia   . Hypertension   . Psoriasis   . Psoriasis   . Seasonal allergies   . Thyroid disease     Past Surgical History:  Procedure Laterality Date  . TONSILLECTOMY    . TUBAL LIGATION      Family History  Problem Relation Age of Onset  . Personality disorder Mother   . Alcohol abuse Maternal Uncle   . Depression Maternal Uncle     Social History   Socioeconomic History  . Marital status: Divorced    Spouse name: Not on file  . Number of children: 1  . Years of education: Not on file  . Highest education level: Associate degree: occupational, Hotel manager, or vocational program  Occupational History  . Not on file  Tobacco Use  . Smoking status: Current Every Day Smoker    Packs/day: 1.00    Types: Cigarettes, E-cigarettes  . Smokeless tobacco: Never Used  Vaping Use  . Vaping Use: Former  Substance and Sexual Activity  . Alcohol use: Yes    Alcohol/week: 0.0 standard drinks    Comment: occasional- a few times a year. 6-7 drinks per episdoe  . Drug use: No  Types: Marijuana    Comment: last time was 3 yrs ago  . Sexual activity: Yes    Birth control/protection: Surgical  Other Topics Concern  . Not on file  Social History Narrative  . Not on file   Social Determinants of Health   Financial Resource Strain: Not on file  Food Insecurity: Not on file  Transportation Needs: No Transportation Needs  . Lack of Transportation (Medical): No  . Lack of Transportation (Non-Medical): No  Physical Activity: Not on  file  Stress: Not on file  Social Connections: Not on file     Observations/Objective: Awake, alert and oriented x 3   Review of Systems  Constitutional: Negative for fever, malaise/fatigue and weight loss.  HENT: Negative.  Negative for nosebleeds.   Eyes: Negative.  Negative for blurred vision, double vision and photophobia.  Respiratory: Negative.  Negative for cough and shortness of breath.   Cardiovascular: Negative.  Negative for chest pain, palpitations and leg swelling.  Gastrointestinal: Negative.  Negative for heartburn, nausea and vomiting.  Musculoskeletal: Negative.  Negative for myalgias.  Neurological: Negative.  Negative for dizziness, focal weakness, seizures and headaches.  Psychiatric/Behavioral: Negative.  Negative for suicidal ideas.    Assessment and Plan: Diagnoses and all orders for this visit:  Type 2 diabetes mellitus without complication, with long-term current use of insulin (HCC) -     Hemoglobin A1c; Future -     CMP14+EGFR; Future -     Microalbumin / creatinine urine ratio; Future Continue blood sugar control as discussed in office today, low carbohydrate diet, and regular physical exercise as tolerated, 150 minutes per week (30 min each day, 5 days per week, or 50 min 3 days per week). Keep blood sugar logs with fasting goal of 90-130 mg/dl, post prandial (after you eat) less than 180.  For Hypoglycemia: BS <60 and Hyperglycemia BS >400; contact the clinic ASAP. Annual eye exams and foot exams are recommended.   High risk heterosexual behavior -     RPR; Future -     HIV antibody (with reflex); Future -     Cervicovaginal ancillary only; Future  Colon cancer screening -     Fecal occult blood, imunochemical([facility]); Future  Dyslipidemia, goal LDL below 70 -     Lipid panel; Future  Leukocytosis, unspecified type -     CBC with Differential; Future     Follow Up Instructions Return for PAP SMEAR.     I discussed the  assessment and treatment plan with the patient. The patient was provided an opportunity to ask questions and all were answered. The patient agreed with the plan and demonstrated an understanding of the instructions.   The patient was advised to call back or seek an in-person evaluation if the symptoms worsen or if the condition fails to improve as anticipated.  I provided 18 minutes of non-face-to-face time during this encounter including median intraservice time, reviewing previous notes, labs, imaging, medications and explaining diagnosis and management.  Gildardo Pounds, FNP-BC

## 2020-06-07 ENCOUNTER — Other Ambulatory Visit: Payer: Self-pay

## 2020-06-09 ENCOUNTER — Other Ambulatory Visit: Payer: Self-pay | Admitting: Nurse Practitioner

## 2020-06-09 ENCOUNTER — Other Ambulatory Visit: Payer: Self-pay

## 2020-06-09 ENCOUNTER — Ambulatory Visit: Payer: Self-pay | Attending: Nurse Practitioner

## 2020-06-09 DIAGNOSIS — Z794 Long term (current) use of insulin: Secondary | ICD-10-CM

## 2020-06-09 DIAGNOSIS — E785 Hyperlipidemia, unspecified: Secondary | ICD-10-CM

## 2020-06-09 DIAGNOSIS — E119 Type 2 diabetes mellitus without complications: Secondary | ICD-10-CM

## 2020-06-09 DIAGNOSIS — Z7251 High risk heterosexual behavior: Secondary | ICD-10-CM

## 2020-06-09 DIAGNOSIS — D72829 Elevated white blood cell count, unspecified: Secondary | ICD-10-CM

## 2020-06-09 NOTE — Telephone Encounter (Signed)
Requested Prescriptions  Pending Prescriptions Disp Refills  . simvastatin (ZOCOR) 40 MG tablet [Pharmacy Med Name: Simvastatin 40 MG Oral Tablet] 90 tablet 0    Sig: Take 1 tablet by mouth once daily     Cardiovascular:  Antilipid - Statins Failed - 06/09/2020  2:02 PM      Failed - Total Cholesterol in normal range and within 360 days    Cholesterol, Total  Date Value Ref Range Status  12/04/2019 219 (H) 100 - 199 mg/dL Final         Failed - LDL in normal range and within 360 days    LDL Chol Calc (NIH)  Date Value Ref Range Status  12/04/2019 132 (H) 0 - 99 mg/dL Final         Failed - Triglycerides in normal range and within 360 days    Triglycerides  Date Value Ref Range Status  12/04/2019 265 (H) 0 - 149 mg/dL Final         Passed - HDL in normal range and within 360 days    HDL  Date Value Ref Range Status  12/04/2019 40 >39 mg/dL Final         Passed - Patient is not pregnant      Passed - Valid encounter within last 12 months    Recent Outpatient Visits          6 days ago Type 2 diabetes mellitus without complication, with long-term current use of insulin ([place])   [place], [place] W, NP   2 months ago Hypothyroidism, unspecified type   [place], [place] W, NP   6 months ago Type 2 diabetes mellitus without complication, with long-term current use of insulin [facility])   [place], Vernia Buff, NP      Future Appointments            In 2 months Gildardo Pounds, NP [place]

## 2020-06-10 ENCOUNTER — Other Ambulatory Visit: Payer: Self-pay

## 2020-06-10 DIAGNOSIS — Z7251 High risk heterosexual behavior: Secondary | ICD-10-CM

## 2020-06-10 LAB — CERVICOVAGINAL ANCILLARY ONLY
Bacterial Vaginitis (gardnerella): POSITIVE — AB
Candida Glabrata: NEGATIVE
Candida Vaginitis: NEGATIVE
Chlamydia: NEGATIVE
Comment: NEGATIVE
Comment: NEGATIVE
Comment: NEGATIVE
Comment: NEGATIVE
Comment: NEGATIVE
Comment: NORMAL
Neisseria Gonorrhea: NEGATIVE
Trichomonas: NEGATIVE

## 2020-06-10 LAB — CMP14+EGFR
ALT: 13 IU/L (ref 0–32)
AST: 15 IU/L (ref 0–40)
Albumin/Globulin Ratio: 1.8 (ref 1.2–2.2)
Albumin: 4.4 g/dL (ref 3.8–4.8)
Alkaline Phosphatase: 68 IU/L (ref 44–121)
BUN/Creatinine Ratio: 15 (ref 9–23)
BUN: 10 mg/dL (ref 6–24)
Bilirubin Total: 0.5 mg/dL (ref 0.0–1.2)
CO2: 20 mmol/L (ref 20–29)
Calcium: 9.5 mg/dL (ref 8.7–10.2)
Chloride: 103 mmol/L (ref 96–106)
Creatinine, Ser: 0.66 mg/dL (ref 0.57–1.00)
Globulin, Total: 2.4 g/dL (ref 1.5–4.5)
Glucose: 144 mg/dL — ABNORMAL HIGH (ref 65–99)
Potassium: 4.7 mmol/L (ref 3.5–5.2)
Sodium: 137 mmol/L (ref 134–144)
Total Protein: 6.8 g/dL (ref 6.0–8.5)
eGFR: 109 mL/min/{1.73_m2} (ref >59)

## 2020-06-10 LAB — RPR: RPR Ser Ql: NONREACTIVE

## 2020-06-10 LAB — CBC WITH DIFFERENTIAL/PLATELET
Basophils Absolute: 0.1 10*3/uL (ref 0.0–0.2)
Basos: 1 %
EOS (ABSOLUTE): 0.3 10*3/uL (ref 0.0–0.4)
Eos: 3 %
Hematocrit: 45 % (ref 34.0–46.6)
Hemoglobin: 15.7 g/dL (ref 11.1–15.9)
Immature Grans (Abs): 0 10*3/uL (ref 0.0–0.1)
Immature Granulocytes: 0 %
Lymphocytes Absolute: 3 10*3/uL (ref 0.7–3.1)
Lymphs: 24 %
MCH: 32.7 pg (ref 26.6–33.0)
MCHC: 34.9 g/dL (ref 31.5–35.7)
MCV: 94 fL (ref 79–97)
Monocytes Absolute: 0.6 10*3/uL (ref 0.1–0.9)
Monocytes: 5 %
Neutrophils Absolute: 8.5 10*3/uL — ABNORMAL HIGH (ref 1.4–7.0)
Neutrophils: 67 %
Platelets: 310 10*3/uL (ref 150–450)
RBC: 4.8 x10E6/uL (ref 3.77–5.28)
RDW: 12 % (ref 11.7–15.4)
WBC: 12.6 10*3/uL — ABNORMAL HIGH (ref 3.4–10.8)

## 2020-06-10 LAB — MICROALBUMIN / CREATININE URINE RATIO
Creatinine, Urine: 75.2 mg/dL
Microalb/Creat Ratio: 7 mg/g creat (ref 0–29)
Microalbumin, Urine: 5.5 ug/mL

## 2020-06-10 LAB — LIPID PANEL
Chol/HDL Ratio: 4 ratio (ref 0.0–4.4)
Cholesterol, Total: 157 mg/dL (ref 100–199)
HDL: 39 mg/dL — ABNORMAL LOW (ref >39)
LDL Chol Calc (NIH): 91 mg/dL (ref 0–99)
Triglycerides: 151 mg/dL — ABNORMAL HIGH (ref 0–149)
VLDL Cholesterol Cal: 27 mg/dL (ref 5–40)

## 2020-06-10 LAB — HEMOGLOBIN A1C
Est. average glucose Bld gHb Est-mCnc: 131 mg/dL
Hgb A1c MFr Bld: 6.2 % — ABNORMAL HIGH (ref 4.8–5.6)

## 2020-06-10 LAB — HIV ANTIBODY (ROUTINE TESTING W REFLEX): HIV Screen 4th Generation wRfx: NONREACTIVE

## 2020-06-11 ENCOUNTER — Encounter (HOSPITAL_COMMUNITY): Payer: Self-pay | Admitting: Psychiatry

## 2020-06-11 ENCOUNTER — Other Ambulatory Visit: Payer: Self-pay

## 2020-06-11 ENCOUNTER — Telehealth (INDEPENDENT_AMBULATORY_CARE_PROVIDER_SITE_OTHER): Payer: No Payment, Other | Admitting: Psychiatry

## 2020-06-11 ENCOUNTER — Other Ambulatory Visit: Payer: Self-pay | Admitting: Nurse Practitioner

## 2020-06-11 ENCOUNTER — Telehealth: Payer: Self-pay | Admitting: Nurse Practitioner

## 2020-06-11 DIAGNOSIS — F319 Bipolar disorder, unspecified: Secondary | ICD-10-CM

## 2020-06-11 DIAGNOSIS — F411 Generalized anxiety disorder: Secondary | ICD-10-CM | POA: Diagnosis not present

## 2020-06-11 DIAGNOSIS — F5105 Insomnia due to other mental disorder: Secondary | ICD-10-CM

## 2020-06-11 DIAGNOSIS — D72829 Elevated white blood cell count, unspecified: Secondary | ICD-10-CM

## 2020-06-11 DIAGNOSIS — F99 Mental disorder, not otherwise specified: Secondary | ICD-10-CM

## 2020-06-11 MED ORDER — BUSPIRONE HCL 30 MG PO TABS
ORAL_TABLET | Freq: Two times a day (BID) | ORAL | 2 refills | Status: DC
  Filled 2020-06-11 – 2020-08-05 (×2): qty 60, 30d supply, fill #0
  Filled 2020-10-03: qty 60, 30d supply, fill #1

## 2020-06-11 MED ORDER — TRAZODONE HCL 150 MG PO TABS
ORAL_TABLET | ORAL | 2 refills | Status: DC
  Filled 2020-06-11 – 2020-06-23 (×2): qty 75, 30d supply, fill #0
  Filled 2020-08-05: qty 75, 30d supply, fill #1
  Filled 2020-09-22: qty 75, 30d supply, fill #2

## 2020-06-11 MED ORDER — LITHIUM CARBONATE 300 MG PO CAPS
ORAL_CAPSULE | Freq: Two times a day (BID) | ORAL | 2 refills | Status: DC
  Filled 2020-06-11 – 2020-08-05 (×3): qty 120, 30d supply, fill #0
  Filled 2020-10-03: qty 120, 30d supply, fill #1

## 2020-06-11 MED ORDER — METRONIDAZOLE 500 MG PO TABS: ORAL_TABLET | Freq: Two times a day (BID) | ORAL | 0 refills | Status: AC

## 2020-06-11 NOTE — Telephone Encounter (Signed)
Copied from [place] [id number]. Topic: General - Other >> Jun 11, 2020  8:53 AM Keene Breath wrote: Reason for CRM: Patient would like to speak with the nurse regarding a prescription that was called into the pharmacy today.  She also has some questions about her labs and why she has to take this medication.  Please advise.  CB# [phone]

## 2020-06-11 NOTE — Progress Notes (Signed)
[facility] MD/PA/NP OP Progress Note   Virtual Visit via Video Note  I connected with Valerie Reilly on 06/11/20 at  3:20 PM EDT by a video enabled telemedicine application and verified that I am speaking with the correct person using two identifiers.  Location: Patient: Home Provider: Clinic   I discussed the limitations of evaluation and management by telemedicine and the availability of in person appointments. The patient expressed understanding and agreed to proceed.  I provided 16 minutes of non-face-to-face time during this encounter.   06/11/2020 3:28 PM Britten Parady Loewen  MRN:  [id number]  Chief Complaint:  " I am doing good."   HPI: Patient reported she is doing well.  She informed that her mood has been stable.  She informed that she recently found a new boyfriend and she has been with them for about 6 months now.  She stated that she had lost her boyfriend of 20 years 3 years ago.  She stated that she went with a dark face following his demise.  However with this new boyfriend things have really improved drastically in the last few months.  She is very happy to find this new person as he is very nice to her. 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  She is also going to start working in the [place] area. She informed that she feels she is on the right regimen. Writer checked the EMR to see when her last lithium level was done and she informed that she just had blood work done 2 days ago.  She is aware of the results. Writer informed her that Probation officer is ordering a lithium level and she can get it done in the next few weeks whenever she gets a chance.  She verbalized understanding.  She mentioned that she has not needed clonazepam in a long time now.  She requested not to send any more refills at present as she has plenty. She denied any other concerns or issues  today.    Visit Diagnosis:    ICD-10-CM   1. Bipolar I disorder (HCC)  F31.9 lithium 300 MG tablet    traZODone (DESYREL) 150 MG tablet    Lithium level  2. GAD (generalized anxiety disorder)  F41.1 busPIRone (BUSPAR) 30 MG tablet  3. Insomnia due to other mental disorder  F51.05 traZODone (DESYREL) 150 MG tablet   F99     Past Psychiatric History: Bipolar disorder, GAD, insomnia  Past Medical History:  Past Medical History:  Diagnosis Date  . Asthma   . Bipolar disorder ([place])   . Diabetes mellitus   . Diabetes mellitus, type II ([place])   . GERD (gastroesophageal reflux disease)   . History of borderline personality disorder   . Hypercholesteremia   . Hypertension   . Psoriasis   . Psoriasis   . Seasonal allergies   . Thyroid disease     Past Surgical History:  Procedure Laterality Date  . TONSILLECTOMY    . TUBAL LIGATION      Family Psychiatric History: see below  Family History:  Family History  Problem Relation Age of Onset  . Personality disorder Mother   . Alcohol abuse Maternal Uncle   . Depression Maternal Uncle     Social History:  Social History   Socioeconomic History  . Marital status: Divorced    Spouse name: Not on file  . Number of children:  1  . Years of education: Not on file  . Highest education level: Associate degree: occupational, Hotel manager, or vocational program  Occupational History  . Not on file  Tobacco Use  . Smoking status: Current Every Day Smoker    Packs/day: 1.00    Types: Cigarettes, E-cigarettes  . Smokeless tobacco: Never Used  Vaping Use  . Vaping Use: Former  Substance and Sexual Activity  . Alcohol use: Yes    Alcohol/week: 0.0 standard drinks    Comment: occasional- a few times a year. 6-7 drinks per episdoe  . Drug use: No    Types: Marijuana    Comment: last time was 3 yrs ago  . Sexual activity: Yes    Birth control/protection: Surgical  Other Topics Concern  . Not on file  Social History Narrative   . Not on file   Social Determinants of Health   Financial Resource Strain: Not on file  Food Insecurity: Not on file  Transportation Needs: No Transportation Needs  . Lack of Transportation (Medical): No  . Lack of Transportation (Non-Medical): No  Physical Activity: Not on file  Stress: Not on file  Social Connections: Not on file    Allergies: No Known Allergies  Metabolic Disorder Labs: Lab Results  Component Value Date   HGBA1C 6.2 (H) 06/09/2020   No results found for: PROLACTIN Lab Results  Component Value Date   CHOL 157 06/09/2020   TRIG 151 (H) 06/09/2020   HDL 39 (L) 06/09/2020   CHOLHDL 4.0 06/09/2020   LDLCALC 91 06/09/2020   LDLCALC 132 (H) 12/04/2019   Lab Results  Component Value Date   TSH 1.350 04/08/2020   TSH 1.870 12/04/2019    Therapeutic Level Labs: Lab Results  Component Value Date   LITHIUM 0.8 12/04/2019   No results found for: VALPROATE No components found for:  CBMZ  Current Medications: Current Outpatient Medications  Medication Sig Dispense Refill  . albuterol (PROVENTIL HFA;VENTOLIN HFA) 108 (90 BASE) MCG/ACT inhaler Inhale into the lungs every 6 (six) hours as needed for wheezing or shortness of breath.    . benazepril (LOTENSIN) 20 MG tablet Take 1 tablet (20 mg total) by mouth daily. 90 tablet 1  . busPIRone (BUSPAR) 30 MG tablet TAKE 1 TABLET (30 MG TOTAL) BY MOUTH 2 (TWO) TIMES DAILY. 60 tablet 2  . cetirizine (ZYRTEC) 10 MG tablet Take 10 mg by mouth daily.    . cholecalciferol (VITAMIN D) 1000 UNITS tablet Take 1,000 Units by mouth daily.    . clonazePAM (KLONOPIN) 0.5 MG tablet Take 1 tablet (0.5 mg total) by mouth at bedtime as needed for anxiety. 20 tablet 2  . Famotidine (PEPCID PO) Take by mouth.    . fluticasone (FLONASE) 50 MCG/ACT nasal spray PLACE 2 SPRAYS INTO BOTH NOSTRILS DAILY. 16 g 1  . glipiZIDE (GLUCOTROL XL) 10 MG 24 hr tablet Take 1 tablet (10 mg total) by mouth daily with breakfast. 90 tablet 1  .  Guselkumab (TREMFYA) 100 MG/ML SOPN Inject 100mg /65ml into the skin at week 0 and 4 and then 8 weeks thereafter. 3 mL 6  . Guselkumab 100 MG/ML SOSY INJECT 100MG /1ML INTO THE SKIN AT WEEK 0 AND 4 AND THEN 8 WEEKS THEREAFTER. 3 mL 6  . levothyroxine (EUTHYROX) 50 MCG tablet TAKE 1 TABLET BY MOUTH ONCE DAILY BEFORE BREAKFAST 90 tablet 0  . lithium 300 MG tablet TAKE 2 CAPSULES TWICE A DAY 120 tablet 2  . lithium carbonate 300 MG capsule TAKE  2 CAPSULES BY MOUTH TWICE A DAY 120 capsule 2  . metFORMIN (GLUCOPHAGE) 1000 MG tablet Take 1 tablet by mouth once daily with breakfast 90 tablet 0  . metroNIDAZOLE (FLAGYL) 500 MG tablet TAKE 1 TABLET (500 MG TOTAL) BY MOUTH 2 (TWO) TIMES DAILY FOR 7 DAYS. 14 tablet 0  . Multiple Vitamin (MULTIVITAMIN) capsule Take 1 capsule by mouth daily.    . nicotine (NICODERM CQ - DOSED IN MG/24 HOURS) 21 mg/24hr patch PLACE 1 PATCH (21 MG TOTAL) ONTO THE SKIN DAILY. 42 patch 0  . simvastatin (ZOCOR) 40 MG tablet Take 1 tablet by mouth once daily 90 tablet 0  . traZODone (DESYREL) 150 MG tablet TAKE 2 & 1/2 TABLETS BY MOUTH AT BEDTIME 75 tablet 2   No current facility-administered medications for this visit.      Psychiatric Specialty Exam: Review of Systems  There were no vitals taken for this visit.There is no height or weight on file to calculate BMI.  General Appearance: Fairly Groomed  Eye Contact:  Good  Speech:  Clear and Coherent and Normal Rate  Volume:  Normal  Mood:  Euthymic  Affect:  Congruent  Thought Process:  Goal Directed and Descriptions of Associations: Intact  Orientation:  Full (Time, Place, and Person)  Thought Content: Logical   Suicidal Thoughts:  No  Homicidal Thoughts:  No  Memory:  Immediate;   Good Recent;   Good  Judgement:  Fair  Insight:  Fair  Psychomotor Activity:  Normal  Concentration:  Concentration: Good and Attention Span: Good  Recall:  Good  Fund of Knowledge: Good  Language: Good  Akathisia:  Negative  Handed:   Right  AIMS (if indicated): not done  Assets:  Communication Skills Desire for Improvement Financial Resources/Insurance Housing  ADL's:  Intact  Cognition: WNL  Sleep:  Good    Assessment and Plan: Patient appears to be doing well in terms of her psychiatric symptoms.  Lithium level was ordered and patient was instructed to get it done at the time of her next blood draw in the primary care clinic.  We will continue the same regimen  1. Bipolar I disorder ([place])  - lithium 300 MG tablet; TAKE 2 CAPSULES TWICE A DAY  Dispense: 120 tablet; Refill: 2 - traZODone (DESYREL) 150 MG tablet; TAKE 2 & 1/2 TABLETS BY MOUTH AT BEDTIME  Dispense: 75 tablet; Refill: 2 - Lithium level ordered.  2. GAD (generalized anxiety disorder)  - busPIRone (BUSPAR) 30 MG tablet; TAKE 1 TABLET (30 MG TOTAL) BY MOUTH 2 (TWO) TIMES DAILY.  Dispense: 60 tablet; Refill: 2 - clonazePAM (KLONOPIN) 0.5 MG tablet; Take 1 tablet (0.5 mg total) by mouth at bedtime as needed for anxiety.  Dispense: 20 tablet; Refill: 2 (Has not taken any dose in more than a month.)  3. Insomnia due to other mental disorder  - traZODone (DESYREL) 150 MG tablet; TAKE 2 & 1/2 TABLETS BY MOUTH AT BEDTIME  Dispense: 75 tablet; Refill: 2  Continue same medication regimen. Follow up in 3 months.  [place] Crane, MD 06/11/2020, 3:28 PM

## 2020-06-11 NOTE — Telephone Encounter (Signed)
Returned pt call and went over lab results pt states she is not going to go see a blood specialist pt states she has seen one before and they all tell her nothing is wrong. Made provider aware. Pt doesn't have any other questions or concerns

## 2020-06-12 ENCOUNTER — Other Ambulatory Visit: Payer: Self-pay

## 2020-06-13 ENCOUNTER — Other Ambulatory Visit: Payer: Self-pay

## 2020-06-19 ENCOUNTER — Other Ambulatory Visit: Payer: Self-pay | Admitting: Nurse Practitioner

## 2020-06-23 ENCOUNTER — Other Ambulatory Visit: Payer: Self-pay | Admitting: Nurse Practitioner

## 2020-06-23 ENCOUNTER — Other Ambulatory Visit: Payer: Self-pay

## 2020-06-23 DIAGNOSIS — I10 Essential (primary) hypertension: Secondary | ICD-10-CM

## 2020-06-26 ENCOUNTER — Other Ambulatory Visit: Payer: Self-pay

## 2020-06-27 ENCOUNTER — Other Ambulatory Visit: Payer: Self-pay

## 2020-07-02 ENCOUNTER — Ambulatory Visit: Payer: No Typology Code available for payment source | Admitting: Family

## 2020-07-02 ENCOUNTER — Other Ambulatory Visit: Payer: No Typology Code available for payment source

## 2020-07-05 ENCOUNTER — Other Ambulatory Visit (HOSPITAL_COMMUNITY): Payer: Self-pay

## 2020-08-05 ENCOUNTER — Other Ambulatory Visit: Payer: Self-pay

## 2020-08-08 ENCOUNTER — Other Ambulatory Visit: Payer: Self-pay

## 2020-08-13 ENCOUNTER — Other Ambulatory Visit: Payer: Self-pay

## 2020-08-13 MED FILL — Guselkumab Soln Prefilled Syringe 100 MG/ML: SUBCUTANEOUS | 56 days supply | Qty: 1 | Fill #0 | Status: AC

## 2020-08-14 ENCOUNTER — Other Ambulatory Visit: Payer: Self-pay

## 2020-08-15 ENCOUNTER — Other Ambulatory Visit: Payer: Self-pay

## 2020-08-18 ENCOUNTER — Other Ambulatory Visit: Payer: Self-pay

## 2020-08-20 ENCOUNTER — Other Ambulatory Visit: Payer: Self-pay

## 2020-08-20 ENCOUNTER — Ambulatory Visit: Payer: No Typology Code available for payment source | Admitting: Nurse Practitioner

## 2020-09-03 ENCOUNTER — Telehealth (HOSPITAL_COMMUNITY): Payer: No Payment, Other | Admitting: Psychiatry

## 2020-09-09 ENCOUNTER — Other Ambulatory Visit: Payer: Self-pay | Admitting: Nurse Practitioner

## 2020-09-09 DIAGNOSIS — E785 Hyperlipidemia, unspecified: Secondary | ICD-10-CM

## 2020-09-12 ENCOUNTER — Other Ambulatory Visit: Payer: Self-pay | Admitting: Nurse Practitioner

## 2020-09-12 DIAGNOSIS — E785 Hyperlipidemia, unspecified: Secondary | ICD-10-CM

## 2020-09-19 ENCOUNTER — Other Ambulatory Visit: Payer: Self-pay | Admitting: Nurse Practitioner

## 2020-09-22 ENCOUNTER — Other Ambulatory Visit: Payer: Self-pay

## 2020-09-24 ENCOUNTER — Other Ambulatory Visit: Payer: Self-pay | Admitting: Nurse Practitioner

## 2020-09-24 DIAGNOSIS — E119 Type 2 diabetes mellitus without complications: Secondary | ICD-10-CM

## 2020-09-26 ENCOUNTER — Other Ambulatory Visit: Payer: Self-pay

## 2020-10-03 ENCOUNTER — Other Ambulatory Visit: Payer: Self-pay

## 2020-10-07 ENCOUNTER — Telehealth (HOSPITAL_COMMUNITY): Payer: No Payment, Other | Admitting: Physician Assistant

## 2020-10-08 ENCOUNTER — Other Ambulatory Visit: Payer: Self-pay

## 2020-10-08 MED FILL — Guselkumab Soln Prefilled Syringe 100 MG/ML: SUBCUTANEOUS | 28 days supply | Qty: 1 | Fill #1 | Status: AC

## 2020-10-09 ENCOUNTER — Other Ambulatory Visit: Payer: Self-pay

## 2020-10-10 ENCOUNTER — Encounter (HOSPITAL_COMMUNITY): Payer: Self-pay | Admitting: Family

## 2020-10-10 ENCOUNTER — Other Ambulatory Visit: Payer: Self-pay

## 2020-10-10 ENCOUNTER — Telehealth (INDEPENDENT_AMBULATORY_CARE_PROVIDER_SITE_OTHER): Payer: No Payment, Other | Admitting: Family

## 2020-10-10 DIAGNOSIS — F411 Generalized anxiety disorder: Secondary | ICD-10-CM

## 2020-10-10 DIAGNOSIS — F99 Mental disorder, not otherwise specified: Secondary | ICD-10-CM | POA: Diagnosis not present

## 2020-10-10 DIAGNOSIS — F319 Bipolar disorder, unspecified: Secondary | ICD-10-CM | POA: Diagnosis not present

## 2020-10-10 DIAGNOSIS — F5105 Insomnia due to other mental disorder: Secondary | ICD-10-CM | POA: Diagnosis not present

## 2020-10-10 MED ORDER — LITHIUM CARBONATE 300 MG PO CAPS
ORAL_CAPSULE | Freq: Two times a day (BID) | ORAL | 2 refills | Status: DC
  Filled 2020-10-10: qty 120, fill #0
  Filled 2020-11-28: qty 120, 30d supply, fill #0
  Filled 2021-01-22: qty 120, 30d supply, fill #1

## 2020-10-10 MED ORDER — BUSPIRONE HCL 30 MG PO TABS
ORAL_TABLET | Freq: Two times a day (BID) | ORAL | 2 refills | Status: DC
  Filled 2020-10-10: qty 60, fill #0
  Filled 2020-11-11: qty 60, 30d supply, fill #0
  Filled 2021-01-06: qty 60, 30d supply, fill #1

## 2020-10-10 MED ORDER — TRAZODONE HCL 150 MG PO TABS
ORAL_TABLET | ORAL | 2 refills | Status: DC
  Filled 2020-10-10: qty 75, fill #0
  Filled 2020-11-03: qty 75, 30d supply, fill #0
  Filled 2020-12-09: qty 75, 30d supply, fill #1
  Filled 2021-01-22: qty 75, 30d supply, fill #2

## 2020-10-10 NOTE — Progress Notes (Signed)
Virtual Visit via Telephone Note  I connected with Valerie Reilly on 10/10/20 at  8:00 AM EDT by telephone and verified that I am speaking with the correct person using two identifiers.  Location: Patient: Home Provider:  Office   I discussed the limitations, risks, security and privacy concerns of performing an evaluation and management service by telephone and the availability of in person appointments. I also discussed with the patient that there may be a patient responsible charge related to this service. The patient expressed understanding and agreed to proceed.  I discussed the assessment and treatment plan with the patient. The patient was provided an opportunity to ask questions and all were answered. The patient agreed with the plan and demonstrated an understanding of the instructions.   The patient was advised to call back or seek an in-person evaluation if the symptoms worsen or if the condition fails to improve as anticipated.  I provided 15 minutes of non-face-to-face time during this encounter.   Valerie Center, NP   [facility] MD/PA/NP OP Progress Note  10/10/2020 8:51 AM Valerie Reilly  MRN:  [id number]  Chief Complaint:  Valerie Reilly reported " I am doing good"   HPI: Valerie Reilly 45 year old female with a charted diagnosis of bipolar disorder, generalized anxiety disorder and insomnia.  She is currently prescribed lithium 300 mg p.o. twice daily, trazodone 150 mg p.o. nightly, BuSpar 30 mg twice daily, and Klonopin 0.5 mg p.o. 3 times daily as needed.  She reports taking and tolerating medications well.  States " I have been on this medication combination for many years."  Reports overall her mood continues to be stable.    [name] reports she is in a good place in her life.  States she is involved in a new relationship and currently resides with his mother.  Reported she is looking to move to a new area.  She is denying suicidal or homicidal ideations.  Denies auditory  or visual hallucinations.  Reports upcoming annual visit with her primary care provider on 10/13/2020.  States she will request for lithium level to be collected at that time.  She denied any medication side effects.  Denied dizziness, blurred visio,  nausea, vomiting or diarrhea.  Reports a good appetite.  States she is resting well throughout the night.   Visit Diagnosis:    ICD-10-CM   1. Bipolar I disorder ([place])  F31.9     2. GAD (generalized anxiety disorder)  F41.1       Past Psychiatric History:   Past Medical History:  Past Medical History:  Diagnosis Date   Asthma    Bipolar disorder ([place])    Diabetes mellitus    Diabetes mellitus, type II ([place])    GERD (gastroesophageal reflux disease)    History of borderline personality disorder    Hypercholesteremia    Hypertension    Psoriasis    Psoriasis    Seasonal allergies    Thyroid disease     Past Surgical History:  Procedure Laterality Date   TONSILLECTOMY     TUBAL LIGATION      Family Psychiatric History:  Family History:  Family History  Problem Relation Age of Onset   Personality disorder Mother    Alcohol abuse Maternal Uncle    Depression Maternal Uncle     Social History:  Social History   Socioeconomic History   Marital status: Divorced    Spouse name: Not on file   Number of children: 1  Years of education: Not on file   Highest education level: Associate degree: occupational, Hotel manager, or vocational program  Occupational History   Not on file  Tobacco Use   Smoking status: Every Day    Packs/day: 1.00    Types: Cigarettes, E-cigarettes   Smokeless tobacco: Never  Vaping Use   Vaping Use: Former  Substance and Sexual Activity   Alcohol use: Yes    Alcohol/week: 0.0 standard drinks    Comment: occasional- a few times a year. 6-7 drinks per episdoe   Drug use: No    Types: Marijuana    Comment: last time was 3 yrs ago   Sexual activity: Yes    Birth control/protection: Surgical   Other Topics Concern   Not on file  Social History Narrative   Not on file   Social Determinants of Health   Financial Resource Strain: Not on file  Food Insecurity: Not on file  Transportation Needs: No Transportation Needs   Lack of Transportation (Medical): No   Lack of Transportation (Non-Medical): No  Physical Activity: Not on file  Stress: Not on file  Social Connections: Not on file    Allergies: No Known Allergies  Metabolic Disorder Labs: Lab Results  Component Value Date   HGBA1C 6.2 (H) 06/09/2020   No results found for: PROLACTIN Lab Results  Component Value Date   CHOL 157 06/09/2020   TRIG 151 (H) 06/09/2020   HDL 39 (L) 06/09/2020   CHOLHDL 4.0 06/09/2020   LDLCALC 91 06/09/2020   LDLCALC 132 (H) 12/04/2019   Lab Results  Component Value Date   TSH 1.350 04/08/2020   TSH 1.870 12/04/2019    Therapeutic Level Labs: Lab Results  Component Value Date   LITHIUM 0.8 12/04/2019   No results found for: VALPROATE No components found for:  CBMZ  Current Medications: Current Outpatient Medications  Medication Sig Dispense Refill   albuterol (PROVENTIL HFA;VENTOLIN HFA) 108 (90 BASE) MCG/ACT inhaler Inhale into the lungs every 6 (six) hours as needed for wheezing or shortness of breath.     benazepril (LOTENSIN) 20 MG tablet Take 1 tablet by mouth once daily 30 tablet 0   busPIRone (BUSPAR) 30 MG tablet TAKE 1 TABLET (30 MG TOTAL) BY MOUTH 2 (TWO) TIMES DAILY. 60 tablet 2   cetirizine (ZYRTEC) 10 MG tablet Take 10 mg by mouth daily.     cholecalciferol (VITAMIN D) 1000 UNITS tablet Take 1,000 Units by mouth daily.     clonazePAM (KLONOPIN) 0.5 MG tablet Take 1 tablet (0.5 mg total) by mouth at bedtime as needed for anxiety. 20 tablet 2   Famotidine (PEPCID PO) Take by mouth.     fluticasone (FLONASE) 50 MCG/ACT nasal spray PLACE 2 SPRAYS INTO BOTH NOSTRILS DAILY. 16 g 1   glipiZIDE (GLUCOTROL XL) 10 MG 24 hr tablet Take 1 tablet by mouth once daily  with breakfast 90 tablet 1   Guselkumab (TREMFYA) 100 MG/ML SOPN Inject '100mg'$ /19m into the skin at week 0 and 4 and then 8 weeks thereafter. 3 mL 6   Guselkumab 100 MG/ML SOSY INJECT '100MG'$ /1ML INTO THE SKIN AT WEEK 0 AND 4 AND THEN 8 WEEKS THEREAFTER. 3 mL 6   levothyroxine (EUTHYROX) 50 MCG tablet TAKE 1 TABLET BY MOUTH ONCE DAILY BEFORE BREAKFAST 90 tablet 0   lithium carbonate 300 MG capsule Take 2 capsules by mouth 2 (two) times daily. 120 capsule 2   lithium carbonate 300 MG capsule TAKE 2 CAPSULES BY MOUTH TWICE A  DAY 120 capsule 2   metFORMIN (GLUCOPHAGE) 1000 MG tablet Take 1 tablet by mouth once daily with breakfast 90 tablet 0   Multiple Vitamin (MULTIVITAMIN) capsule Take 1 capsule by mouth daily.     nicotine (NICODERM CQ - DOSED IN MG/24 HOURS) 21 mg/24hr patch PLACE 1 PATCH (21 MG TOTAL) ONTO THE SKIN DAILY. 42 patch 0   simvastatin (ZOCOR) 40 MG tablet Take 1 tablet by mouth once daily 90 tablet 0   traZODone (DESYREL) 150 MG tablet TAKE 2 & 1/2 TABLETS BY MOUTH AT BEDTIME 75 tablet 2   No current facility-administered medications for this visit.     Musculoskeletal: Strength & Muscle Tone: within normal limits Gait & Station: normal Patient leans: N/A  Psychiatric Specialty Exam: Review of Systems  There were no vitals taken for this visit.There is no height or weight on file to calculate BMI.  General Appearance: NA  Eye Contact:  NA  Speech:  Clear and Coherent  Volume:  Normal  Mood:  Euthymic  Affect:  Congruent  Thought Process:  Coherent  Orientation:  Full (Time, Place, and Person)  Thought Content: Logical   Suicidal Thoughts:  No  Homicidal Thoughts:  No  Memory:  Immediate;   Fair Recent;   Fair  Judgement:  Fair  Insight:  Fair  Psychomotor Activity:  Normal  Concentration:  Concentration: Good  Recall:  Good  Fund of Knowledge: Good  Language: Good  Akathisia:  No  Handed:  Right  AIMS (if indicated): done  Assets:  Communication  Skills Physical Health Resilience Social Support  ADL's:  Intact  Cognition: WNL  Sleep:  Fair   Screenings:   Assessment and Plan:   Bipolar disorder: Generalized anxiety disorder: Insomnia disorder:  Continue lithium 300 mg p.o. twice daily -Lithium level to be collected 10/13/2020 Continue buspirone 30 mg p.o. twice daily Continue trazodone 150 mg p.o. nightly  Follow-up 1 month to review lithium level, with possible medication adjustments.     Valerie Center, NP 10/10/2020, 8:51 AM

## 2020-10-13 ENCOUNTER — Other Ambulatory Visit: Payer: Self-pay

## 2020-10-13 ENCOUNTER — Other Ambulatory Visit (HOSPITAL_COMMUNITY)
Admission: RE | Admit: 2020-10-13 | Discharge: 2020-10-13 | Disposition: A | Discharge status: Home / Self Care | Payer: No Typology Code available for payment source | Source: Ambulatory Visit | Attending: Nurse Practitioner | Admitting: Nurse Practitioner

## 2020-10-13 ENCOUNTER — Encounter: Payer: Self-pay | Admitting: Nurse Practitioner

## 2020-10-13 ENCOUNTER — Ambulatory Visit: Payer: Self-pay | Attending: Nurse Practitioner | Admitting: Nurse Practitioner

## 2020-10-13 VITALS — BP 123/69 | HR 82 | Ht 66.0 in | Wt 246.6 lb

## 2020-10-13 DIAGNOSIS — Z124 Encounter for screening for malignant neoplasm of cervix: Secondary | ICD-10-CM

## 2020-10-13 DIAGNOSIS — Z114 Encounter for screening for human immunodeficiency virus [HIV]: Secondary | ICD-10-CM

## 2020-10-13 DIAGNOSIS — Z1211 Encounter for screening for malignant neoplasm of colon: Secondary | ICD-10-CM

## 2020-10-13 DIAGNOSIS — F319 Bipolar disorder, unspecified: Secondary | ICD-10-CM

## 2020-10-13 NOTE — Progress Notes (Signed)
Assessment & Plan:  Valerie Reilly was seen today for gynecologic exam.  Diagnoses and all orders for this visit:  Encounter for Papanicolaou smear for cervical cancer screening -     Cytology - PAP -     Cervicovaginal ancillary only  Encounter for screening for HIV -     HIV antibody (with reflex)  Colon cancer screening -     Fecal occult blood, imunochemical([facility])  Bipolar I disorder ([place]) -     Lithium level Not taking buspar as indicated. I have instructed her to take it twice a day as prescribed to help reduce anxiety.    Patient has been counseled on age-appropriate routine health concerns for screening and prevention. These are reviewed and up-to-date. Referrals have been placed accordingly. Immunizations are up-to-date or declined.    Subjective:   Chief Complaint  Patient presents with   Gynecologic Exam   [name] 45 y.o. female presents to office today for PAP smear.   has a past medical history of Asthma, Bipolar disorder ([place]), Diabetes mellitus, Diabetes mellitus, type II ([place]), GERD (gastroesophageal reflux disease), History of borderline personality disorder, Hypercholesteremia, Hypertension, Psoriasis, Psoriasis, Seasonal allergies, and Thyroid disease.    Premenopausal symptoms Endorses increased irritability, decreased sex drive, vaginal atrophy over the past several months. Last menstrual cycle April 9th. She has a tubal ligation.     Review of Systems  Constitutional:  Negative for fever, malaise/fatigue and weight loss.  HENT: Negative.  Negative for nosebleeds.   Eyes: Negative.  Negative for blurred vision, double vision and photophobia.  Respiratory: Negative.  Negative for cough and shortness of breath.   Cardiovascular: Negative.  Negative for chest pain, palpitations and leg swelling.  Gastrointestinal: Negative.  Negative for heartburn, nausea and vomiting.  Genitourinary:        SEE HPI  Musculoskeletal: Negative.   Negative for myalgias.  Neurological: Negative.  Negative for dizziness, focal weakness, seizures and headaches.  Psychiatric/Behavioral:  Positive for depression. Negative for suicidal ideas. The patient is nervous/anxious.    Past Medical History:  Diagnosis Date   Asthma    Bipolar disorder ([place])    Diabetes mellitus    Diabetes mellitus, type II ([place])    GERD (gastroesophageal reflux disease)    History of borderline personality disorder    Hypercholesteremia    Hypertension    Psoriasis    Psoriasis    Seasonal allergies    Thyroid disease     Past Surgical History:  Procedure Laterality Date   TONSILLECTOMY     TUBAL LIGATION      Family History  Problem Relation Age of Onset   Personality disorder Mother    Alcohol abuse Maternal Uncle    Depression Maternal Uncle     Social History Reviewed with no changes to be made today.   Outpatient Medications Prior to Visit  Medication Sig Dispense Refill   albuterol (PROVENTIL HFA;VENTOLIN HFA) 108 (90 BASE) MCG/ACT inhaler Inhale into the lungs every 6 (six) hours as needed for wheezing or shortness of breath.     benazepril (LOTENSIN) 20 MG tablet Take 1 tablet by mouth once daily 30 tablet 0   busPIRone (BUSPAR) 30 MG tablet TAKE 1 TABLET (30 MG TOTAL) BY MOUTH 2 (TWO) TIMES DAILY. 60 tablet 2   cetirizine (ZYRTEC) 10 MG tablet Take 10 mg by mouth daily.     cholecalciferol (VITAMIN D) 1000 UNITS tablet Take 1,000 Units by mouth daily.  clonazePAM (KLONOPIN) 0.5 MG tablet Take 1 tablet (0.5 mg total) by mouth at bedtime as needed for anxiety. 20 tablet 2   Famotidine (PEPCID PO) Take by mouth.     fluticasone (FLONASE) 50 MCG/ACT nasal spray PLACE 2 SPRAYS INTO BOTH NOSTRILS DAILY. 16 g 1   glipiZIDE (GLUCOTROL XL) 10 MG 24 hr tablet Take 1 tablet by mouth once daily with breakfast 90 tablet 1   Guselkumab (TREMFYA) 100 MG/ML SOPN Inject '100mg'$ /9m into the skin at week 0 and 4 and then 8 weeks thereafter. 3 mL 6    Guselkumab 100 MG/ML SOSY INJECT '100MG'$ /1ML INTO THE SKIN AT WEEK 0 AND 4 AND THEN 8 WEEKS THEREAFTER. 3 mL 6   levothyroxine (EUTHYROX) 50 MCG tablet TAKE 1 TABLET BY MOUTH ONCE DAILY BEFORE BREAKFAST 90 tablet 0   lithium carbonate 300 MG capsule Take 2 capsules by mouth 2 (two) times daily. 120 capsule 2   metFORMIN (GLUCOPHAGE) 1000 MG tablet Take 1 tablet by mouth once daily with breakfast 90 tablet 0   Multiple Vitamin (MULTIVITAMIN) capsule Take 1 capsule by mouth daily.     nicotine (NICODERM CQ - DOSED IN MG/24 HOURS) 21 mg/24hr patch PLACE 1 PATCH (21 MG TOTAL) ONTO THE SKIN DAILY. 42 patch 0   simvastatin (ZOCOR) 40 MG tablet Take 1 tablet by mouth once daily 90 tablet 0   traZODone (DESYREL) 150 MG tablet TAKE 2 & 1/2 TABLETS BY MOUTH AT BEDTIME 75 tablet 2   No facility-administered medications prior to visit.    No Known Allergies     Objective:    BP 123/69   Pulse 82   Ht '5\' 6"'$  (1.676 m)   Wt 246 lb 9.6 oz (111.9 kg)   SpO2 97%   BMI 39.80 kg/m  Wt Readings from Last 3 Encounters:  10/13/20 246 lb 9.6 oz (111.9 kg)  03/11/20 225 lb 9.6 oz (102.3 kg)  12/04/19 220 lb (99.8 kg)    Physical Exam Constitutional:      Appearance: She is well-developed.  HENT:     Head: Normocephalic.  Cardiovascular:     Rate and Rhythm: Normal rate and regular rhythm.     Heart sounds: Normal heart sounds.  Pulmonary:     Effort: Pulmonary effort is normal.     Breath sounds: Normal breath sounds.  Abdominal:     General: Bowel sounds are normal.     Palpations: Abdomen is soft.     Hernia: There is no hernia in the left inguinal area.  Genitourinary:    Labia:        Right: No rash, tenderness, lesion or injury.        Left: No rash, tenderness, lesion or injury.      Vagina: Normal. No signs of injury and foreign body. No vaginal discharge, erythema, tenderness or bleeding.     Cervix: No cervical motion tenderness or friability.     Uterus: Not deviated and not  enlarged.      Adnexa:        Right: No mass, tenderness or fullness.         Left: No mass, tenderness or fullness.       Rectum: Normal. No external hemorrhoid.  Lymphadenopathy:     Lower Body: No right inguinal adenopathy. No left inguinal adenopathy.  Skin:    General: Skin is warm and dry.  Neurological:     Mental Status: She is alert and oriented to person, place,  and time.  Psychiatric:        Behavior: Behavior normal.        Thought Content: Thought content normal.        Judgment: Judgment normal.         Patient has been counseled extensively about nutrition and exercise as well as the importance of adherence with medications and regular follow-up. The patient was given clear instructions to go to ER or return to medical center if symptoms don't improve, worsen or new problems develop. The patient verbalized understanding.   Follow-up: Return in about 3 months (around 01/13/2021) for prediabetes.   Gildardo Pounds, FNP-BC [facility] and [facility] [place], [place]   10/13/2020, 3:45 PM

## 2020-10-14 LAB — CERVICOVAGINAL ANCILLARY ONLY
Bacterial Vaginitis (gardnerella): POSITIVE — AB
Candida Glabrata: NEGATIVE
Candida Vaginitis: NEGATIVE
Chlamydia: NEGATIVE
Comment: NEGATIVE
Comment: NEGATIVE
Comment: NEGATIVE
Comment: NEGATIVE
Comment: NEGATIVE
Comment: NORMAL
Neisseria Gonorrhea: NEGATIVE
Trichomonas: NEGATIVE

## 2020-10-14 LAB — LITHIUM LEVEL: Lithium Lvl: 0.8 mmol/L (ref 0.5–1.2)

## 2020-10-14 LAB — HIV ANTIBODY (ROUTINE TESTING W REFLEX): HIV Screen 4th Generation wRfx: NONREACTIVE

## 2020-10-15 ENCOUNTER — Other Ambulatory Visit: Payer: Self-pay | Admitting: Nurse Practitioner

## 2020-10-15 LAB — CYTOLOGY - PAP
Comment: NEGATIVE
Diagnosis: UNDETERMINED — AB
High risk HPV: NEGATIVE

## 2020-10-15 MED ORDER — METRONIDAZOLE 500 MG PO TABS: 500.0000 mg | ORAL_TABLET | Freq: Two times a day (BID) | ORAL | 0 refills | Status: AC

## 2020-10-17 ENCOUNTER — Other Ambulatory Visit (HOSPITAL_COMMUNITY): Payer: Self-pay

## 2020-10-22 ENCOUNTER — Telehealth: Payer: Self-pay

## 2020-10-22 NOTE — Telephone Encounter (Signed)
Patient was called and a voicemail was left informing patient to return phone call for lab results.  Crm created

## 2020-10-22 NOTE — Telephone Encounter (Signed)
-----   Message from Gildardo Pounds, NP sent at 10/15/2020  6:26 PM EDT ----- Repeat pap in 3 years. Normal lithium level. HIV negative. BV on vaginal swab. Flagyl sent.

## 2020-10-25 ENCOUNTER — Other Ambulatory Visit: Payer: Self-pay | Admitting: Nurse Practitioner

## 2020-10-25 DIAGNOSIS — I10 Essential (primary) hypertension: Secondary | ICD-10-CM

## 2020-10-25 NOTE — Telephone Encounter (Signed)
Requested Prescriptions  Pending Prescriptions Disp Refills  . benazepril (LOTENSIN) 20 MG tablet [Pharmacy Med Name: Benazepril HCl 20 MG Oral Tablet] 90 tablet 0    Sig: Take 1 tablet by mouth once daily     Cardiovascular:  ACE Inhibitors Passed - 10/25/2020 10:39 AM      Passed - Cr in normal range and within 180 days    Creatinine  Date Value Ref Range Status  04/06/2017 0.80 0.60 - 1.10 mg/dL Final   Creatinine, Ser  Date Value Ref Range Status  06/09/2020 0.66 0.57 - 1.00 mg/dL Final         Passed - K in normal range and within 180 days    Potassium  Date Value Ref Range Status  06/09/2020 4.7 3.5 - 5.2 mmol/L Final         Passed - Patient is not pregnant      Passed - Last BP in normal range    BP Readings from Last 1 Encounters:  10/13/20 123/69         Passed - Valid encounter within last 6 months    Recent Outpatient Visits          1 week ago Encounter for Papanicolaou smear for cervical cancer screening   [place], [place] W, NP   4 months ago Type 2 diabetes mellitus without complication, with long-term current use of insulin [facility])   [place], [place] W, NP   6 months ago Hypothyroidism, unspecified type   [place], [place] W, NP   10 months ago Type 2 diabetes mellitus without complication, with long-term current use of insulin [facility])   [place], Vernia Buff, NP      Future Appointments            In 2 months Gildardo Pounds, NP [place]

## 2020-11-03 ENCOUNTER — Other Ambulatory Visit: Payer: Self-pay

## 2020-11-06 ENCOUNTER — Other Ambulatory Visit: Payer: Self-pay

## 2020-11-11 ENCOUNTER — Other Ambulatory Visit: Payer: Self-pay

## 2020-11-13 ENCOUNTER — Other Ambulatory Visit: Payer: Self-pay

## 2020-11-14 ENCOUNTER — Telehealth (INDEPENDENT_AMBULATORY_CARE_PROVIDER_SITE_OTHER): Payer: No Payment, Other | Admitting: Physician Assistant

## 2020-11-14 ENCOUNTER — Other Ambulatory Visit: Payer: Self-pay

## 2020-11-14 ENCOUNTER — Encounter (HOSPITAL_COMMUNITY): Payer: Self-pay | Admitting: Physician Assistant

## 2020-11-14 DIAGNOSIS — F319 Bipolar disorder, unspecified: Secondary | ICD-10-CM

## 2020-11-14 DIAGNOSIS — F5105 Insomnia due to other mental disorder: Secondary | ICD-10-CM | POA: Diagnosis not present

## 2020-11-14 DIAGNOSIS — F411 Generalized anxiety disorder: Secondary | ICD-10-CM

## 2020-11-14 DIAGNOSIS — F99 Mental disorder, not otherwise specified: Secondary | ICD-10-CM

## 2020-11-14 NOTE — Progress Notes (Addendum)
[place] MD/PA/NP OP Progress Note  Virtual Visit via Telephone Note  I connected with Valerie Reilly on 11/14/20 at 10:30 AM EDT by telephone and verified that I am speaking with the correct person using two identifiers.  Location: Patient: Home Provider: Clinic   I discussed the limitations, risks, security and privacy concerns of performing an evaluation and management service by telephone and the availability of in person appointments. I also discussed with the patient that there may be a patient responsible charge related to this service. The patient expressed understanding and agreed to proceed.  Follow Up Instructions:  I discussed the assessment and treatment plan with the patient. The patient was provided an opportunity to ask questions and all were answered. The patient agreed with the plan and demonstrated an understanding of the instructions.   The patient was advised to call back or seek an in-person evaluation if the symptoms worsen or if the condition fails to improve as anticipated.  I provided 17 minutes of non-face-to-face time during this encounter.  [name], PA   11/14/2020 7:13 PM Valerie Reilly  MRN:  [id number]  Chief Complaint: Follow up and medication management  HPI:   Valerie Reilly is a 45 year old female with a past psychiatric history significant for bipolar 1 disorder, generalized anxiety disorder, and insomnia who presents to [facility] via virtual telephone visit for follow-up and medication management.  Patient is currently being managed on the following medications:  Lithium 300 mg 2 times daily Buspirone 30 mg 2 times daily  Trazodone 150 mg at bedtime  Patient reports no issues or concerns regarding her current medication regimen.  Patient denies the need for dosage adjustments at this time and does not require any refills on her medications following the conclusion of the encounter.  Patient  reports that she had a lithium level taken.  Per chart review under labs, patient's lithium level is within normal limits (0.8 and mmol/L).  Patient denies any depressive symptoms.  She endorses minimal anxiety and rates her anxiety at 2 or 3 out of 10.  Patient's stressors include navigating her new relationship with her boyfriend and anniversary of the passing of a loved one.  Patient reports that she is currently living in a hotel.  Patient is alert and oriented x4, calm, cooperative, and fully engaged in conversation during the encounter.  Patient endorses being a little nervous and states that she still needs to take one of her medications today.  Patient denies suicidal or homicidal ideations.  She further denies auditory or visual hallucinations and does not appear to be responding to internal/external stimuli.  Patient endorses good sleep and receives on average 7 to 8 hours of sleep each night.  Patient endorses good appetite and eats on average 2 meals per day.  Patient endorses alcohol consumption occasionally.  She endorses tobacco use and smokes on average 2 packs/day.  Patient denies illicit drug use.  Visit Diagnosis:    ICD-10-CM   1. Bipolar I disorder ([place])  F31.9     2. GAD (generalized anxiety disorder)  F41.1     3. Insomnia due to other mental disorder  F51.05    F99       Past Psychiatric History:  Bipolar disorder Generalized anxiety disorder Insomnia  Past Medical History:  Past Medical History:  Diagnosis Date   Asthma    Bipolar disorder ([place])    Diabetes mellitus    Diabetes mellitus, type II (  HCC)    GERD (gastroesophageal reflux disease)    History of borderline personality disorder    Hypercholesteremia    Hypertension    Psoriasis    Psoriasis    Seasonal allergies    Thyroid disease     Past Surgical History:  Procedure Laterality Date   TONSILLECTOMY     TUBAL LIGATION      Family Psychiatric History:  Mother - Personality disorder Uncle  (maternal) - Alcohol abuse Uncle (maternal) - Depression  Family History:  Family History  Problem Relation Age of Onset   Personality disorder Mother    Alcohol abuse Maternal Uncle    Depression Maternal Uncle     Social History:  Social History   Socioeconomic History   Marital status: Divorced    Spouse name: Not on file   Number of children: 1   Years of education: Not on file   Highest education level: Associate degree: occupational, Hotel manager, or vocational program  Occupational History   Not on file  Tobacco Use   Smoking status: Every Day    Packs/day: 1.00    Types: Cigarettes, E-cigarettes   Smokeless tobacco: Never  Vaping Use   Vaping Use: Former  Substance and Sexual Activity   Alcohol use: Yes    Alcohol/week: 0.0 standard drinks    Comment: occasional- a few times a year. 6-7 drinks per episdoe   Drug use: No    Types: Marijuana    Comment: last time was 3 yrs ago   Sexual activity: Yes    Birth control/protection: Surgical  Other Topics Concern   Not on file  Social History Narrative   Not on file   Social Determinants of Health   Financial Resource Strain: Not on file  Food Insecurity: Not on file  Transportation Needs: No Transportation Needs   Lack of Transportation (Medical): No   Lack of Transportation (Non-Medical): No  Physical Activity: Not on file  Stress: Not on file  Social Connections: Not on file    Allergies: No Known Allergies  Metabolic Disorder Labs: Lab Results  Component Value Date   HGBA1C 6.2 (H) 06/09/2020   No results found for: PROLACTIN Lab Results  Component Value Date   CHOL 157 06/09/2020   TRIG 151 (H) 06/09/2020   HDL 39 (L) 06/09/2020   CHOLHDL 4.0 06/09/2020   LDLCALC 91 06/09/2020   LDLCALC 132 (H) 12/04/2019   Lab Results  Component Value Date   TSH 1.350 04/08/2020   TSH 1.870 12/04/2019    Therapeutic Level Labs: Lab Results  Component Value Date   LITHIUM 0.8 10/13/2020   LITHIUM  0.8 12/04/2019   No results found for: VALPROATE No components found for:  CBMZ  Current Medications: Current Outpatient Medications  Medication Sig Dispense Refill   albuterol (PROVENTIL HFA;VENTOLIN HFA) 108 (90 BASE) MCG/ACT inhaler Inhale into the lungs every 6 (six) hours as needed for wheezing or shortness of breath.     benazepril (LOTENSIN) 20 MG tablet Take 1 tablet by mouth once daily 90 tablet 0   busPIRone (BUSPAR) 30 MG tablet TAKE 1 TABLET (30 MG TOTAL) BY MOUTH 2 (TWO) TIMES DAILY. 60 tablet 2   cetirizine (ZYRTEC) 10 MG tablet Take 10 mg by mouth daily.     cholecalciferol (VITAMIN D) 1000 UNITS tablet Take 1,000 Units by mouth daily.     clonazePAM (KLONOPIN) 0.5 MG tablet Take 1 tablet (0.5 mg total) by mouth at bedtime as needed for anxiety. Tabiona  tablet 2   Famotidine (PEPCID PO) Take by mouth.     fluticasone (FLONASE) 50 MCG/ACT nasal spray PLACE 2 SPRAYS INTO BOTH NOSTRILS DAILY. 16 g 1   glipiZIDE (GLUCOTROL XL) 10 MG 24 hr tablet Take 1 tablet by mouth once daily with breakfast 90 tablet 1   Guselkumab (TREMFYA) 100 MG/ML SOPN Inject '100mg'$ /21m into the skin at week 0 and 4 and then 8 weeks thereafter. 3 mL 6   Guselkumab 100 MG/ML SOSY INJECT '100MG'$ /1ML INTO THE SKIN AT WEEK 0 AND 4 AND THEN 8 WEEKS THEREAFTER. 3 mL 6   levothyroxine (EUTHYROX) 50 MCG tablet TAKE 1 TABLET BY MOUTH ONCE DAILY BEFORE BREAKFAST 90 tablet 0   lithium carbonate 300 MG capsule Take 2 capsules by mouth 2 (two) times daily. 120 capsule 2   metFORMIN (GLUCOPHAGE) 1000 MG tablet Take 1 tablet by mouth once daily with breakfast 90 tablet 0   Multiple Vitamin (MULTIVITAMIN) capsule Take 1 capsule by mouth daily.     nicotine (NICODERM CQ - DOSED IN MG/24 HOURS) 21 mg/24hr patch PLACE 1 PATCH (21 MG TOTAL) ONTO THE SKIN DAILY. 42 patch 0   simvastatin (ZOCOR) 40 MG tablet Take 1 tablet by mouth once daily 90 tablet 0   traZODone (DESYREL) 150 MG tablet TAKE 2 & 1/2 TABLETS BY MOUTH AT BEDTIME 75  tablet 2   No current facility-administered medications for this visit.     Musculoskeletal: Strength & Muscle Tone: Unable to assess due to telemedicine visit [place] Unable to assess due to telemedicine visit Patient leans: Unable to assess due to telemedicine visit  Psychiatric Specialty Exam: Review of Systems  Psychiatric/Behavioral:  Negative for decreased concentration, dysphoric mood, hallucinations, self-injury, sleep disturbance and suicidal ideas. The patient is nervous/anxious. The patient is not hyperactive.    There were no vitals taken for this visit.There is no height or weight on file to calculate BMI.  General Appearance: Unable to assess due to telemedicine visit  Eye Contact:  Unable to assess due to telemedicine visit  Speech:  Clear and Coherent and Normal Rate  Volume:  Normal  Mood:  Anxious and Euthymic  Affect:  Appropriate and Congruent  Thought Process:  Coherent and Descriptions of Associations: Intact  Orientation:  Full (Time, Place, and Person)  Thought Content: WDL   Suicidal Thoughts:  No  Homicidal Thoughts:  No  Memory:  Immediate;   Good Recent;   Good Remote;   Good  Judgement:  Fair  Insight:  Fair  Psychomotor Activity:  Normal  Concentration:  Concentration: Good and Attention Span: Good  Recall:  Good  Fund of Knowledge: Good  Language: Good  Akathisia:  Negative  Handed:  Right  AIMS (if indicated): not done  Assets:  Communication Skills Desire for Improvement Financial Resources/Insurance Housing  ADL's:  Intact  Cognition: WNL  Sleep:  Good   Screenings: GAD-7    [facility]-7 Score 1      PHQ2-9    Flowsheet Row [facility] [facility] PHQ-2 Total Score 0      Flowsheet Row [place] in [facility] Plan:   disorder, generalized anxiety disorder, and insomnia [facility] for follow-up and medication management.  Patient reports no issues or concerns regarding her current medication regimen.  Patient denies the need for dosage adjustments at this time and does not need any refills on her medications at this time.  Patient states that her medications are helpful in the management of her symptoms.  Patient to continue taking her medications as prescribed.  1. Bipolar I disorder ([place]) Patient to continue lithium 300 mg 2 times daily for the management of her bipolar disorder  2. GAD (generalized anxiety disorder) Patient to continue buspirone 30 mg 2 times daily for the management of her generalized anxiety disorder  3. Insomnia due to other mental disorder Patient to continue trazodone 150 mg at bedtime for the management of her insomnia  Patient to follow up in 3 months Provider spent a total of 17 minutes with the patient/reviewing patient's chart  [name], PA 11/14/2020, 7:13 PM

## 2020-11-28 ENCOUNTER — Other Ambulatory Visit: Payer: Self-pay

## 2020-12-04 ENCOUNTER — Other Ambulatory Visit: Payer: Self-pay

## 2020-12-09 ENCOUNTER — Other Ambulatory Visit: Payer: Self-pay

## 2020-12-09 ENCOUNTER — Ambulatory Visit: Payer: Self-pay | Attending: Nurse Practitioner | Admitting: Nurse Practitioner

## 2020-12-09 ENCOUNTER — Other Ambulatory Visit: Payer: Self-pay | Admitting: Nurse Practitioner

## 2020-12-09 ENCOUNTER — Encounter: Payer: Self-pay | Admitting: Nurse Practitioner

## 2020-12-09 DIAGNOSIS — E785 Hyperlipidemia, unspecified: Secondary | ICD-10-CM

## 2020-12-09 DIAGNOSIS — J069 Acute upper respiratory infection, unspecified: Secondary | ICD-10-CM

## 2020-12-09 MED ORDER — PROMETHAZINE-DM 6.25-15 MG/5ML PO SYRP: 5.0000 mL | ORAL_SOLUTION | Freq: Four times a day (QID) | ORAL | 0 refills | Status: DC | PRN

## 2020-12-09 MED FILL — Guselkumab Soln Prefilled Syringe 100 MG/ML: SUBCUTANEOUS | 28 days supply | Qty: 1 | Fill #2 | Status: AC

## 2020-12-09 NOTE — Progress Notes (Signed)
Virtual Visit via Telephone Note Due to national recommendations of social distancing due to [place] 19, telehealth visit is felt to be most appropriate for this patient at this time.  I discussed the limitations, risks, security and privacy concerns of performing an evaluation and management service by telephone and the availability of in person appointments. I also discussed with the patient that there may be a patient responsible charge related to this service. The patient expressed understanding and agreed to proceed.    I connected with Valerie Reilly on 12/09/20  at   2:50 PM EDT  EDT by telephone and verified that I am speaking with the correct person using two identifiers.  Location of Patient: Private Residence   Location of Provider: [place] and [place] participating in Telemedicine visit: Geryl Rankins FNP-BC [place]nn Patteson [place]    History of Present Illness: Telemedicine visit for: URI  Upper Respiratory Infection: Patient complains of 5 day onset of the following:  CONGESTION B/L EAR PRESSURE  SINUS PRESSURE HEADACHE COUGH MILD FATIGUE SINUS DRAINAGE UNSURE OF FEVER Onset 5 days ago States OTC  MUCINEX DM AND SUDAFED ARE NOT WORKING She is also taking flonase OTC and allegra with some relief of symptoms.        Past Medical History:  Diagnosis Date   Asthma    Bipolar disorder ([place])    Diabetes mellitus    Diabetes mellitus, type II ([place])    GERD (gastroesophageal reflux disease)    History of borderline personality disorder    Hypercholesteremia    Hypertension    Psoriasis    Psoriasis    Seasonal allergies    Thyroid disease     Past Surgical History:  Procedure Laterality Date   TONSILLECTOMY     TUBAL LIGATION      Family History  Problem Relation Age of Onset   Personality disorder Mother    Alcohol abuse Maternal Uncle    Depression Maternal Uncle     Social History   Socioeconomic History    Marital status: Divorced    Spouse name: Not on file   Number of children: 1   Years of education: Not on file   Highest education level: Associate degree: occupational, Hotel manager, or vocational program  Occupational History   Not on file  Tobacco Use   Smoking status: Every Day    Packs/day: 1.00    Types: Cigarettes, E-cigarettes   Smokeless tobacco: Never  Vaping Use   Vaping Use: Former  Substance and Sexual Activity   Alcohol use: Yes    Alcohol/week: 0.0 standard drinks    Comment: occasional- a few times a year. 6-7 drinks per episdoe   Drug use: No    Types: Marijuana    Comment: last time was 3 yrs ago   Sexual activity: Yes    Birth control/protection: Surgical  Other Topics Concern   Not on file  Social History Narrative   Not on file   Social Determinants of Health   Financial Resource Strain: Not on file  Food Insecurity: Not on file  Transportation Needs: No Transportation Needs   Lack of Transportation (Medical): No   Lack of Transportation (Non-Medical): No  Physical Activity: Not on file  Stress: Not on file  Social Connections: Not on file     Observations/Objective: Awake, alert and oriented x 3   Review of Systems  Constitutional:  Negative for fever, malaise/fatigue and weight loss.  HENT:  Positive  for congestion and sinus pain. Negative for nosebleeds and sore throat.   Eyes: Negative.  Negative for blurred vision, double vision and photophobia.  Respiratory:  Positive for cough. Negative for shortness of breath and wheezing.   Cardiovascular: Negative.  Negative for chest pain, palpitations and leg swelling.  Gastrointestinal: Negative.  Negative for heartburn, nausea and vomiting.  Musculoskeletal: Negative.  Negative for myalgias.  Neurological:  Positive for headaches. Negative for dizziness, focal weakness and seizures.  Psychiatric/Behavioral: Negative.  Negative for suicidal ideas.    Assessment and Plan: Diagnoses and all orders  for this visit:  Upper respiratory infection, viral -     promethazine-dextromethorphan (PROMETHAZINE-DM) 6.25-15 MG/5ML syrup; Take 5 mLs by mouth 4 (four) times daily as needed for cough. INSTRUCTIONS: use a humidifier for nasal congestion Drink plenty of fluids, rest and wash hands frequently to avoid the spread of infection Alternate tylenol and Motrin for relief of fever. Alka Seltzer cold OTC  Recommended COVID testing as well.   Follow Up Instructions Return if symptoms worsen or fail to improve. AFTER 10-14 DAYS    I discussed the assessment and treatment plan with the patient. The patient was provided an opportunity to ask questions and all were answered. The patient agreed with the plan and demonstrated an understanding of the instructions.   The patient was advised to call back or seek an in-person evaluation if the symptoms worsen or if the condition fails to improve as anticipated.  I provided 15 minutes of non-face-to-face time during this encounter including median intraservice time, reviewing previous notes, labs, imaging, medications and explaining diagnosis and management.  Gildardo Pounds, FNP-BC

## 2020-12-10 ENCOUNTER — Other Ambulatory Visit: Payer: Self-pay

## 2020-12-11 ENCOUNTER — Other Ambulatory Visit: Payer: Self-pay

## 2020-12-11 ENCOUNTER — Ambulatory Visit: Payer: Self-pay | Attending: Nurse Practitioner

## 2020-12-11 ENCOUNTER — Ambulatory Visit: Payer: Self-pay | Admitting: *Deleted

## 2020-12-11 DIAGNOSIS — R350 Frequency of micturition: Secondary | ICD-10-CM

## 2020-12-11 LAB — POCT URINALYSIS DIP (CLINITEK)
Bilirubin, UA: NEGATIVE
Glucose, UA: NEGATIVE mg/dL
Ketones, POC UA: NEGATIVE mg/dL
Nitrite, UA: NEGATIVE
POC PROTEIN,UA: NEGATIVE
Spec Grav, UA: 1.015 (ref 1.010–1.025)
Urobilinogen, UA: 0.2 E.U./dL
pH, UA: 7.5 (ref 5.0–8.0)

## 2020-12-11 NOTE — Telephone Encounter (Signed)
     Reason for Disposition  [1] Pain or burning with passing urine (urination) AND [2] flank pain (i.e., in side, below ribs and above hip)    Lower back pain, urine discolored  Answer Assessment - Initial Assessment Questions 1. ONSET: "When did the pain begin?"      Tues evening 2. LOCATION: "Where does it hurt?" (upper, mid or lower back)     Right sided lower back 3. SEVERITY: "How bad is the pain?"  (e.g., Scale 1-10; mild, moderate, or severe)   - MILD (1-3): doesn't interfere with normal activities    - MODERATE (4-7): interferes with normal activities or awakens from sleep    - SEVERE (8-10): excruciating pain, unable to do any normal activities      8/10 4. PATTERN: "Is the pain constant?" (e.g., yes, no; constant, intermittent)      constant 5. RADIATION: "Does the pain shoot into your legs or elsewhere?"     no 6. CAUSE:  "What do you think is causing the back pain?"      "Maybe UTI" 7. BACK OVERUSE:  "Any recent lifting of heavy objects, strenuous work or exercise?"     no 8. MEDICATIONS: "What have you taken so far for the pain?" (e.g., nothing, acetaminophen, NSAIDS)     nothing 9. NEUROLOGIC SYMPTOMS: "Do you have any weakness, numbness, or problems with bowel/bladder control?"     H/O stress incontinence. 10. OTHER SYMPTOMS: "Do you have any other symptoms?" (e.g., fever, abdominal pain, burning with urination, blood in urine)       Urine darker, nausea, chills, sweating, slept all day yesterday. No fever  Protocols used: Back Pain-A-AH

## 2020-12-11 NOTE — Telephone Encounter (Signed)
Pt states Virtual visit Tuesday with ZeldaStates sinus issues "A little better" Now presents with right sided lower back pain, chills sweating, urine darker than usual, frequency. States onset Tuesday evening. Reports sinus issues "A little better but I've been sleeping a lot, feel like crap." Pt did covid home test 12/09/20, negative. States had UTI in past, "Feels like that." Did check temp during call, afebrile. Advised UC, no availability in practice, declines, can't afford. Questioning if she can leave urine specimen at lab or get an antibiotic. Advised would more than likely need to be seen bu assured pt NT would route to practice for PCPs review and final disposition. Again reiterated need for eval today, declines. Please advise.

## 2020-12-11 NOTE — Progress Notes (Signed)
Pt came in for urine sample PCP informed of reuslts.

## 2020-12-11 NOTE — Telephone Encounter (Signed)
Called Pt and she is coming by to drop off a urine sample to the office.

## 2020-12-12 ENCOUNTER — Other Ambulatory Visit: Payer: Self-pay | Admitting: Nurse Practitioner

## 2020-12-12 DIAGNOSIS — J019 Acute sinusitis, unspecified: Secondary | ICD-10-CM

## 2020-12-12 DIAGNOSIS — N3001 Acute cystitis with hematuria: Secondary | ICD-10-CM

## 2020-12-12 DIAGNOSIS — B9689 Other specified bacterial agents as the cause of diseases classified elsewhere: Secondary | ICD-10-CM

## 2020-12-12 LAB — URINALYSIS, ROUTINE W REFLEX MICROSCOPIC
Bilirubin, UA: NEGATIVE
Glucose, UA: NEGATIVE
Ketones, UA: NEGATIVE
Nitrite, UA: NEGATIVE
RBC, UA: NEGATIVE
Specific Gravity, UA: 1.015 (ref 1.005–1.030)
Urobilinogen, Ur: 0.2 mg/dL (ref 0.2–1.0)
pH, UA: 8 — ABNORMAL HIGH (ref 5.0–7.5)

## 2020-12-12 LAB — MICROSCOPIC EXAMINATION
Casts: NONE SEEN /lpf
WBC, UA: >30 /hpf — AB (ref 0–5)

## 2020-12-12 MED ORDER — AMOXICILLIN-POT CLAVULANATE 875-125 MG PO TABS: 1.0000 | ORAL_TABLET | Freq: Two times a day (BID) | ORAL | 0 refills | Status: AC

## 2020-12-12 MED ORDER — FLUCONAZOLE 150 MG PO TABS: 150.0000 mg | ORAL_TABLET | Freq: Once | ORAL | 1 refills | Status: AC

## 2020-12-17 ENCOUNTER — Telehealth: Payer: Self-pay | Admitting: *Deleted

## 2020-12-17 NOTE — Telephone Encounter (Signed)
Copied from [place] [id number]. Topic: General - Inquiry >> Dec 12, 2020 11:49 AM Alanda Slim E wrote: Reason for CRM: Pt called a bit rude and started off with cursing/ but she asked about her urinalysis result from yesterday and she also stated she asked for an antibiotic or medication to be called in for her sinus issues/ she stated that Zelda said if she didn't feel better she would call something in for her by Friday / please advise

## 2020-12-18 ENCOUNTER — Other Ambulatory Visit: Payer: Self-pay | Admitting: Nurse Practitioner

## 2020-12-18 NOTE — Telephone Encounter (Signed)
PCP already spoke to pt about results

## 2020-12-19 NOTE — Telephone Encounter (Signed)
Requested Prescriptions  Pending Prescriptions Disp Refills  . metFORMIN (GLUCOPHAGE) 1000 MG tablet [Pharmacy Med Name: metFORMIN HCl 1000 MG Oral Tablet] 30 tablet 0    Sig: Take 1 tablet by mouth once daily with breakfast     Endocrinology:  Diabetes - Biguanides Failed - 12/18/2020 11:18 AM      Failed - HBA1C is between 0 and 7.9 and within 180 days    Hgb A1c MFr Bld  Date Value Ref Range Status  06/09/2020 6.2 (H) 4.8 - 5.6 % Final    Comment:             Prediabetes: 5.7 - 6.4          Diabetes: >6.4          Glycemic control for adults with diabetes: <7.0          Passed - Cr in normal range and within 360 days    Creatinine  Date Value Ref Range Status  04/06/2017 0.80 0.60 - 1.10 mg/dL Final   Creatinine, Ser  Date Value Ref Range Status  06/09/2020 0.66 0.57 - 1.00 mg/dL Final         Passed - eGFR in normal range and within 360 days    GFR, Est AFR Am  Date Value Ref Range Status  04/06/2017 >60 >60 mL/min Final    Comment:    (NOTE) The eGFR has been calculated using the CKD EPI equation. This calculation has not been validated in all clinical situations. eGFR's persistently <60 mL/min signify possible Chronic Kidney Disease.    GFR calc Af Amer  Date Value Ref Range Status  12/04/2019 120 >59 mL/min/1.73 Final    Comment:    **[facility] currently reports eGFR in compliance with the current**   recommendations of the [employer]. [facility] will   update reporting as new guidelines are published from the NKF-ASN   Task force.    GFR, Estimated  Date Value Ref Range Status  04/06/2017 >60 >60 mL/min Final   GFR calc non Af Amer  Date Value Ref Range Status  12/04/2019 104 >59 mL/min/1.73 Final   eGFR  Date Value Ref Range Status  06/09/2020 109 >59 mL/min/1.73 Final         Passed - Valid encounter within last 6 months    Recent Outpatient Visits          1 week ago Upper respiratory infection, viral   [place], Vernia Buff, NP   2 months ago Encounter for Papanicolaou smear for cervical cancer screening   [place], [place] W, NP   6 months ago Type 2 diabetes mellitus without complication, with long-term current use of insulin [facility])   [place], Vernia Buff, NP   8 months ago Hypothyroidism, unspecified type   [place], [place] W, NP   1 year ago Type 2 diabetes mellitus without complication, with long-term current use of insulin [facility])   [place], Vernia Buff, NP      Future Appointments            In 3 weeks Gildardo Pounds, NP [place]

## 2021-01-06 ENCOUNTER — Other Ambulatory Visit: Payer: Self-pay

## 2021-01-09 ENCOUNTER — Other Ambulatory Visit: Payer: Self-pay

## 2021-01-09 ENCOUNTER — Ambulatory Visit: Payer: Self-pay | Attending: Nurse Practitioner | Admitting: Nurse Practitioner

## 2021-01-09 ENCOUNTER — Other Ambulatory Visit (HOSPITAL_COMMUNITY)
Admission: RE | Admit: 2021-01-09 | Discharge: 2021-01-09 | Disposition: A | Discharge status: Home / Self Care | Payer: No Typology Code available for payment source | Source: Ambulatory Visit | Attending: Nurse Practitioner | Admitting: Nurse Practitioner

## 2021-01-09 ENCOUNTER — Encounter: Payer: Self-pay | Admitting: Nurse Practitioner

## 2021-01-09 VITALS — BP 131/84 | HR 84 | Ht 66.0 in | Wt 241.4 lb

## 2021-01-09 DIAGNOSIS — Z1211 Encounter for screening for malignant neoplasm of colon: Secondary | ICD-10-CM

## 2021-01-09 DIAGNOSIS — I10 Essential (primary) hypertension: Secondary | ICD-10-CM

## 2021-01-09 DIAGNOSIS — N760 Acute vaginitis: Secondary | ICD-10-CM | POA: Insufficient documentation

## 2021-01-09 DIAGNOSIS — Z23 Encounter for immunization: Secondary | ICD-10-CM

## 2021-01-09 DIAGNOSIS — D72829 Elevated white blood cell count, unspecified: Secondary | ICD-10-CM

## 2021-01-09 DIAGNOSIS — E119 Type 2 diabetes mellitus without complications: Secondary | ICD-10-CM

## 2021-01-09 DIAGNOSIS — Z114 Encounter for screening for human immunodeficiency virus [HIV]: Secondary | ICD-10-CM

## 2021-01-09 DIAGNOSIS — E785 Hyperlipidemia, unspecified: Secondary | ICD-10-CM

## 2021-01-09 DIAGNOSIS — Z794 Long term (current) use of insulin: Secondary | ICD-10-CM

## 2021-01-09 LAB — POCT GLYCOSYLATED HEMOGLOBIN (HGB A1C): Hemoglobin A1C: 7 % — AB (ref 4.0–5.6)

## 2021-01-09 LAB — GLUCOSE, POCT (MANUAL RESULT ENTRY): POC Glucose: 204 mg/dl — AB (ref 70–99)

## 2021-01-09 NOTE — Progress Notes (Signed)
Assessment & Plan:  Valerie Reilly was seen today for diabetes.  Diagnoses and all orders for this visit:  Primary hypertension -     CMP14+EGFR  Dyslipidemia, goal LDL below 70 -     Lipid panel  Type 2 diabetes mellitus without complication, with long-term current use of insulin (HCC) -     POCT glucose (manual entry) -     POCT glycosylated hemoglobin (Hb A1C) -     CMP14+EGFR  Acute vaginitis -     Cervicovaginal ancillary only  Leukocytosis, unspecified type -     CBC with Differential  Encounter for screening for HIV -     HIV antibody (with reflex)  Colon cancer screening -     Fecal occult blood, imunochemical([facility])  Need for immunization against influenza -     Flu Vaccine QUAD 59moIM (Fluarix, Fluzone & Alfiuria Quad PF)   Patient has been counseled on age-appropriate routine health concerns for screening and prevention. These are reviewed and up-to-date. Referrals have been placed accordingly. Immunizations are up-to-date or declined.    Subjective:   Chief Complaint  Patient presents with   Diabetes   [name] 45y.o. female presents to office today for follow up to DM 2, HTN and HPL.  DM 2 Weight is up almost 20 pounds.  She endorses medication adherence taking Glucotrol XL 10 mg daily and metformin 1000 mg twice daily.  Discussed dietary modifications today.  If A1c continues to increase will need to likely add GLP-1.  LDL not at goal with simvastatin 40 mg daily and likely related to dietary nonadherence. Lab Results  Component Value Date   HGBA1C 7.0 (A) 01/09/2021    Lab Results  Component Value Date   LDLCALC 91 06/09/2020    HTN Blood pressure is controlled with benazepril 20 mg daily. BP Readings from Last 3 Encounters:  01/09/21 131/84  10/13/20 123/69  03/11/20 126/80     Vaginitis Endorses vaginal irritation and discomfort.  She has a history of bacterial vaginosis rate occurring.  Endorses monogamous  relationship with current partner.  Review of Systems  Constitutional:  Negative for fever, malaise/fatigue and weight loss.  HENT: Negative.  Negative for nosebleeds.   Eyes: Negative.  Negative for blurred vision, double vision and photophobia.  Respiratory: Negative.  Negative for cough and shortness of breath.   Cardiovascular: Negative.  Negative for chest pain, palpitations and leg swelling.  Gastrointestinal: Negative.  Negative for heartburn, nausea and vomiting.  Genitourinary:        See HPI  Musculoskeletal: Negative.  Negative for myalgias.  Neurological: Negative.  Negative for dizziness, focal weakness, seizures and headaches.  Psychiatric/Behavioral: Negative.  Negative for suicidal ideas.    Past Medical History:  Diagnosis Date   Asthma    Bipolar disorder ([place]    Diabetes mellitus    Diabetes mellitus, type II ([place]    GERD (gastroesophageal reflux disease)    History of borderline personality disorder    Hypercholesteremia    Hypertension    Psoriasis    Psoriasis    Seasonal allergies    Thyroid disease     Past Surgical History:  Procedure Laterality Date   TONSILLECTOMY     TUBAL LIGATION      Family History  Problem Relation Age of Onset   Personality disorder Mother    Alcohol abuse Maternal Uncle    Depression Maternal Uncle     Social History Reviewed with no changes to  be made today.   Outpatient Medications Prior to Visit  Medication Sig Dispense Refill   albuterol (PROVENTIL HFA;VENTOLIN HFA) 108 (90 BASE) MCG/ACT inhaler Inhale into the lungs every 6 (six) hours as needed for wheezing or shortness of breath.     benazepril (LOTENSIN) 20 MG tablet Take 1 tablet by mouth once daily 90 tablet 0   busPIRone (BUSPAR) 30 MG tablet TAKE 1 TABLET (30 MG TOTAL) BY MOUTH 2 (TWO) TIMES DAILY. 60 tablet 2   cholecalciferol (VITAMIN D) 1000 UNITS tablet Take 1,000 Units by mouth daily.     clonazePAM (KLONOPIN) 0.5 MG tablet Take 1 tablet (0.5 mg  total) by mouth at bedtime as needed for anxiety. 20 tablet 2   Famotidine (PEPCID PO) Take by mouth.     fluticasone (FLONASE) 50 MCG/ACT nasal spray PLACE 2 SPRAYS INTO BOTH NOSTRILS DAILY. 16 g 1   glipiZIDE (GLUCOTROL XL) 10 MG 24 hr tablet Take 1 tablet by mouth once daily with breakfast 90 tablet 1   Guselkumab (TREMFYA) 100 MG/ML SOPN Inject 164m/1ml into the skin at week 0 and 4 and then 8 weeks thereafter. 3 mL 6   Guselkumab 100 MG/ML SOSY INJECT 100MG/1ML INTO THE SKIN AT WEEK 0 AND 4 AND THEN 8 WEEKS THEREAFTER. 3 mL 6   levothyroxine (EUTHYROX) 50 MCG tablet TAKE 1 TABLET BY MOUTH ONCE DAILY BEFORE BREAKFAST 90 tablet 0   lithium carbonate 300 MG capsule Take 2 capsules by mouth 2 (two) times daily. 120 capsule 2   metFORMIN (GLUCOPHAGE) 1000 MG tablet Take 1 tablet by mouth once daily with breakfast 30 tablet 0   Multiple Vitamin (MULTIVITAMIN) capsule Take 1 capsule by mouth daily.     nicotine (NICODERM CQ - DOSED IN MG/24 HOURS) 21 mg/24hr patch PLACE 1 PATCH (21 MG TOTAL) ONTO THE SKIN DAILY. 42 patch 0   promethazine-dextromethorphan (PROMETHAZINE-DM) 6.25-15 MG/5ML syrup Take 5 mLs by mouth 4 (four) times daily as needed for cough. 240 mL 0   simvastatin (ZOCOR) 40 MG tablet Take 1 tablet by mouth once daily 90 tablet 0   traZODone (DESYREL) 150 MG tablet TAKE 2 & 1/2 TABLETS BY MOUTH AT BEDTIME 75 tablet 2   No facility-administered medications prior to visit.    No Known Allergies     Objective:    BP 131/84   Pulse 84   Ht _0  (1.676 m)   Wt 241 lb 6 oz (109.5 kg)   SpO2 98%   BMI 38.96 kg/m  Wt Readings from Last 3 Encounters:  01/09/21 241 lb 6 oz (109.5 kg)  10/13/20 246 lb 9.6 oz (111.9 kg)  03/11/20 225 lb 9.6 oz (102.3 kg)    Physical Exam Vitals and nursing note reviewed.  Constitutional:      Appearance: She is well-developed.  HENT:     Head: Normocephalic and atraumatic.  Cardiovascular:     Rate and Rhythm: Normal rate and regular  rhythm.     Heart sounds: Normal heart sounds. No murmur heard.   No friction rub. No gallop.  Pulmonary:     Effort: Pulmonary effort is normal. No tachypnea or respiratory distress.     Breath sounds: Normal breath sounds. No decreased breath sounds, wheezing, rhonchi or rales.  Chest:     Chest wall: No tenderness.  Abdominal:     General: Bowel sounds are normal.     Palpations: Abdomen is soft.  Musculoskeletal:        General:  Normal range of motion.     Cervical back: Normal range of motion.  Skin:    General: Skin is warm and dry.  Neurological:     Mental Status: She is alert and oriented to person, place, and time.     Coordination: Coordination normal.  Psychiatric:        Behavior: Behavior normal. Behavior is cooperative.        Thought Content: Thought content normal.        Judgment: Judgment normal.         Patient has been counseled extensively about nutrition and exercise as well as the importance of adherence with medications and regular follow-up. The patient was given clear instructions to go to ER or return to medical center if symptoms don't improve, worsen or new problems develop. The patient verbalized understanding.   Follow-up: Return in about 3 months (around 04/11/2021).   Gildardo Pounds, FNP-BC [facility] [place], [place]   01/09/2021, 12:59 PM

## 2021-01-10 LAB — CBC WITH DIFFERENTIAL/PLATELET
Basophils Absolute: 0.1 10*3/uL (ref 0.0–0.2)
Basos: 1 %
EOS (ABSOLUTE): 0.4 10*3/uL (ref 0.0–0.4)
Eos: 2 %
Hematocrit: 46.4 % (ref 34.0–46.6)
Hemoglobin: 16.2 g/dL — ABNORMAL HIGH (ref 11.1–15.9)
Immature Grans (Abs): 0.1 10*3/uL (ref 0.0–0.1)
Immature Granulocytes: 1 %
Lymphocytes Absolute: 3.1 10*3/uL (ref 0.7–3.1)
Lymphs: 19 %
MCH: 31.8 pg (ref 26.6–33.0)
MCHC: 34.9 g/dL (ref 31.5–35.7)
MCV: 91 fL (ref 79–97)
Monocytes Absolute: 1.1 10*3/uL — ABNORMAL HIGH (ref 0.1–0.9)
Monocytes: 7 %
Neutrophils Absolute: 11.8 10*3/uL — ABNORMAL HIGH (ref 1.4–7.0)
Neutrophils: 70 %
Platelets: 287 10*3/uL (ref 150–450)
RBC: 5.09 x10E6/uL (ref 3.77–5.28)
RDW: 12.3 % (ref 11.7–15.4)
WBC: 16.5 10*3/uL — ABNORMAL HIGH (ref 3.4–10.8)

## 2021-01-10 LAB — CMP14+EGFR
ALT: 19 IU/L (ref 0–32)
AST: 16 IU/L (ref 0–40)
Albumin/Globulin Ratio: 2.1 (ref 1.2–2.2)
Albumin: 4.8 g/dL (ref 3.8–4.8)
Alkaline Phosphatase: 75 IU/L (ref 44–121)
BUN/Creatinine Ratio: 14 (ref 9–23)
BUN: 10 mg/dL (ref 6–24)
Bilirubin Total: 0.6 mg/dL (ref 0.0–1.2)
CO2: 21 mmol/L (ref 20–29)
Calcium: 10.3 mg/dL — ABNORMAL HIGH (ref 8.7–10.2)
Chloride: 102 mmol/L (ref 96–106)
Creatinine, Ser: 0.69 mg/dL (ref 0.57–1.00)
Globulin, Total: 2.3 g/dL (ref 1.5–4.5)
Glucose: 168 mg/dL — ABNORMAL HIGH (ref 70–99)
Potassium: 4.2 mmol/L (ref 3.5–5.2)
Sodium: 139 mmol/L (ref 134–144)
Total Protein: 7.1 g/dL (ref 6.0–8.5)
eGFR: 108 mL/min/{1.73_m2} (ref >59)

## 2021-01-10 LAB — LIPID PANEL
Chol/HDL Ratio: 5 ratio — ABNORMAL HIGH (ref 0.0–4.4)
Cholesterol, Total: 184 mg/dL (ref 100–199)
HDL: 37 mg/dL — ABNORMAL LOW (ref >39)
LDL Chol Calc (NIH): 115 mg/dL — ABNORMAL HIGH (ref 0–99)
Triglycerides: 183 mg/dL — ABNORMAL HIGH (ref 0–149)
VLDL Cholesterol Cal: 32 mg/dL (ref 5–40)

## 2021-01-10 LAB — HIV ANTIBODY (ROUTINE TESTING W REFLEX): HIV Screen 4th Generation wRfx: NONREACTIVE

## 2021-01-12 ENCOUNTER — Telehealth: Payer: Self-pay

## 2021-01-12 LAB — CERVICOVAGINAL ANCILLARY ONLY
Bacterial Vaginitis (gardnerella): POSITIVE — AB
Candida Glabrata: POSITIVE — AB
Candida Vaginitis: NEGATIVE
Chlamydia: NEGATIVE
Comment: NEGATIVE
Comment: NEGATIVE
Comment: NEGATIVE
Comment: NEGATIVE
Comment: NEGATIVE
Comment: NORMAL
Neisseria Gonorrhea: NEGATIVE
Trichomonas: NEGATIVE

## 2021-01-12 NOTE — Telephone Encounter (Signed)
-----   Message from Gildardo Pounds, NP sent at 01/12/2021 12:02 PM EST ----- Pls put ms Shibata on the schedule for tomorrow to discuss her lab results. Thanks!!

## 2021-01-13 ENCOUNTER — Other Ambulatory Visit: Payer: Self-pay | Admitting: Nurse Practitioner

## 2021-01-13 ENCOUNTER — Ambulatory Visit: Payer: Self-pay | Attending: Nurse Practitioner | Admitting: Nurse Practitioner

## 2021-01-13 ENCOUNTER — Other Ambulatory Visit: Payer: Self-pay

## 2021-01-13 ENCOUNTER — Encounter: Payer: Self-pay | Admitting: Nurse Practitioner

## 2021-01-13 ENCOUNTER — Telehealth: Payer: Self-pay | Admitting: Hematology and Oncology

## 2021-01-13 DIAGNOSIS — B9689 Other specified bacterial agents as the cause of diseases classified elsewhere: Secondary | ICD-10-CM

## 2021-01-13 DIAGNOSIS — B3731 Acute candidiasis of vulva and vagina: Secondary | ICD-10-CM

## 2021-01-13 DIAGNOSIS — N760 Acute vaginitis: Secondary | ICD-10-CM

## 2021-01-13 DIAGNOSIS — D72829 Elevated white blood cell count, unspecified: Secondary | ICD-10-CM

## 2021-01-13 MED ORDER — FLUCONAZOLE 150 MG PO TABS: 150.0000 mg | ORAL_TABLET | Freq: Once | ORAL | 1 refills | Status: AC

## 2021-01-13 MED ORDER — METRONIDAZOLE 500 MG PO TABS: 500.0000 mg | ORAL_TABLET | Freq: Two times a day (BID) | ORAL | 0 refills | Status: AC

## 2021-01-13 NOTE — Telephone Encounter (Signed)
Scheduled appt per 11/8 referral. Pt is aware of appt date and time.

## 2021-01-13 NOTE — Progress Notes (Signed)
Virtual Visit via Telephone Note Due to national recommendations of social distancing due to [place] 19, telehealth visit is felt to be most appropriate for this patient at this time.  I discussed the limitations, risks, security and privacy concerns of performing an evaluation and management service by telephone and the availability of in person appointments. I also discussed with the patient that there may be a patient responsible charge related to this service. The patient expressed understanding and agreed to proceed.    I connected with Valerie Reilly on 01/13/21  at   8:50 AM EST  EDT by telephone and verified that I am speaking with the correct person using two identifiers.  Location of Patient: Private Residence   Location of Provider: [place] participating in Telemedicine visit: Geryl Rankins FNP-BC [place]    History of Present Illness: Telemedicine visit for: Lab results All lab results were discussed in detail today.  The 10-year ASCVD risk score (Arnett DK, et al., 2019) is: 12.3%   Values used to calculate the score:     Age: 45 years     Sex: Female     Is Non-Hispanic African American: No     Diabetic: Yes     Tobacco smoker: Yes     Systolic Blood Pressure: 782 mmHg     Is BP treated: Yes     HDL Cholesterol: 37 mg/dL     Total Cholesterol: 184 mg/dL   We discussed her re occurring leukocytosis today which appears to be worsening. She is a chronic smoker. I have referred her to hematology for further workup and advised her to apply for our financial assistance program today.  Vaginal swab positive for bacterial vaginitis and yeast infection. Her BV is chronic however she declines to start long term metronidazole gel today.     Past Medical History:  Diagnosis Date   Asthma    Bipolar disorder ([place])    Diabetes mellitus    Diabetes mellitus, type II ([place])    GERD (gastroesophageal reflux disease)     History of borderline personality disorder    Hypercholesteremia    Hypertension    Psoriasis    Psoriasis    Seasonal allergies    Thyroid disease     Past Surgical History:  Procedure Laterality Date   TONSILLECTOMY     TUBAL LIGATION      Family History  Problem Relation Age of Onset   Personality disorder Mother    Alcohol abuse Maternal Uncle    Depression Maternal Uncle     Social History   Socioeconomic History   Marital status: Divorced    Spouse name: Not on file   Number of children: 1   Years of education: Not on file   Highest education level: Associate degree: occupational, Hotel manager, or vocational program  Occupational History   Not on file  Tobacco Use   Smoking status: Every Day    Packs/day: 1.00    Types: Cigarettes, E-cigarettes   Smokeless tobacco: Never  Vaping Use   Vaping Use: Former  Substance and Sexual Activity   Alcohol use: Yes    Alcohol/week: 0.0 standard drinks    Comment: occasional- a few times a year. 6-7 drinks per episdoe   Drug use: No    Types: Marijuana    Comment: last time was 3 yrs ago   Sexual activity: Yes    Birth control/protection: Surgical  Other Topics Concern  Not on file  Social History Narrative   Not on file   Social Determinants of Health   Financial Resource Strain: Not on file  Food Insecurity: Not on file  Transportation Needs: No Transportation Needs   Lack of Transportation (Medical): No   Lack of Transportation (Non-Medical): No  Physical Activity: Not on file  Stress: Not on file  Social Connections: Not on file     Observations/Objective: Awake, alert and oriented x 3   Review of Systems  Constitutional:  Negative for fever, malaise/fatigue and weight loss.  HENT: Negative.  Negative for nosebleeds.   Eyes: Negative.  Negative for blurred vision, double vision and photophobia.  Respiratory: Negative.  Negative for cough and shortness of breath.   Cardiovascular: Negative.  Negative  for chest pain, palpitations and leg swelling.  Gastrointestinal: Negative.  Negative for heartburn, nausea and vomiting.  Musculoskeletal: Negative.  Negative for myalgias.  Neurological: Negative.  Negative for dizziness, focal weakness, seizures and headaches.  Psychiatric/Behavioral: Negative.  Negative for suicidal ideas.    Assessment and Plan: Diagnoses and all orders for this visit:  Leukocytosis, unspecified type -     Ambulatory referral to Hematology / Oncology  Bacterial vaginosis -     fluconazole (DIFLUCAN) 150 MG tablet; Take 1 tablet (150 mg total) by mouth once for 1 dose.  Vaginal yeast infection -     metroNIDAZOLE (FLAGYL) 500 MG tablet; Take 1 tablet (500 mg total) by mouth 2 (two) times daily for 7 days.    Follow Up Instructions Return if symptoms worsen or fail to improve.     I discussed the assessment and treatment plan with the patient. The patient was provided an opportunity to ask questions and all were answered. The patient agreed with the plan and demonstrated an understanding of the instructions.   The patient was advised to call back or seek an in-person evaluation if the symptoms worsen or if the condition fails to improve as anticipated.  I provided 10 minutes of non-face-to-face time during this encounter including median intraservice time, reviewing previous notes, labs, imaging, medications and explaining diagnosis and management.  Gildardo Pounds, FNP-BC

## 2021-01-16 ENCOUNTER — Other Ambulatory Visit: Payer: Self-pay | Admitting: Nurse Practitioner

## 2021-01-16 DIAGNOSIS — I10 Essential (primary) hypertension: Secondary | ICD-10-CM

## 2021-01-16 NOTE — Telephone Encounter (Signed)
Requested Prescriptions  Pending Prescriptions Disp Refills  . benazepril (LOTENSIN) 20 MG tablet [Pharmacy Med Name: Benazepril HCl 20 MG Oral Tablet] 90 tablet 0    Sig: Take 1 tablet by mouth once daily     Cardiovascular:  ACE Inhibitors Passed - 01/16/2021 12:04 PM      Passed - Cr in normal range and within 180 days    Creatinine  Date Value Ref Range Status  04/06/2017 0.80 0.60 - 1.10 mg/dL Final   Creatinine, Ser  Date Value Ref Range Status  01/09/2021 0.69 0.57 - 1.00 mg/dL Final         Passed - K in normal range and within 180 days    Potassium  Date Value Ref Range Status  01/09/2021 4.2 3.5 - 5.2 mmol/L Final         Passed - Patient is not pregnant      Passed - Last BP in normal range    BP Readings from Last 1 Encounters:  01/09/21 131/84         Passed - Valid encounter within last 6 months    Recent Outpatient Visits          3 days ago Leukocytosis, unspecified type   [place], Vernia Buff, NP   1 week ago Primary hypertension   [place], Vernia Buff, NP   1 month ago Upper respiratory infection, viral   [place], Vernia Buff, NP   3 months ago Encounter for Papanicolaou smear for cervical cancer screening   [place], [place] W, NP   7 months ago Type 2 diabetes mellitus without complication, with long-term current use of insulin [facility])   [place], Vernia Buff, NP      Future Appointments            In 2 months Gildardo Pounds, NP [place]           . metFORMIN (GLUCOPHAGE) 1000 MG tablet [Pharmacy Med Name: metFORMIN HCl 1000 MG Oral Tablet] 90 tablet 0    Sig: Take 1 tablet by mouth once daily with breakfast     Endocrinology:  Diabetes - Biguanides Passed - 01/16/2021 12:04 PM      Passed - Cr in normal range and within  360 days    Creatinine  Date Value Ref Range Status  04/06/2017 0.80 0.60 - 1.10 mg/dL Final   Creatinine, Ser  Date Value Ref Range Status  01/09/2021 0.69 0.57 - 1.00 mg/dL Final         Passed - HBA1C is between 0 and 7.9 and within 180 days    Hemoglobin A1C  Date Value Ref Range Status  01/09/2021 7.0 (A) 4.0 - 5.6 % Final   Hgb A1c MFr Bld  Date Value Ref Range Status  06/09/2020 6.2 (H) 4.8 - 5.6 % Final    Comment:             Prediabetes: 5.7 - 6.4          Diabetes: >6.4          Glycemic control for adults with diabetes: <7.0          Passed - eGFR in normal range and within 360 days    GFR, Est AFR Am  Date Value Ref Range Status  04/06/2017 >60 >  60 mL/min Final    Comment:    (NOTE) The eGFR has been calculated using the CKD EPI equation. This calculation has not been validated in all clinical situations. eGFR's persistently <60 mL/min signify possible Chronic Kidney Disease.    GFR calc Af Amer  Date Value Ref Range Status  12/04/2019 120 >59 mL/min/1.73 Final    Comment:    **[facility] currently reports eGFR in compliance with the current**   recommendations of the [employer]. [facility] will   update reporting as new guidelines are published from the NKF-ASN   Task force.    GFR, Estimated  Date Value Ref Range Status  04/06/2017 >60 >60 mL/min Final   GFR calc non Af Amer  Date Value Ref Range Status  12/04/2019 104 >59 mL/min/1.73 Final   eGFR  Date Value Ref Range Status  01/09/2021 108 >59 mL/min/1.73 Final         Passed - Valid encounter within last 6 months    Recent Outpatient Visits          3 days ago Leukocytosis, unspecified type   [place], Vernia Buff, NP   1 week ago Primary hypertension   [place], Vernia Buff, NP   1 month ago Upper respiratory infection, viral   [place], Vernia Buff, NP   3  months ago Encounter for Papanicolaou smear for cervical cancer screening   [place], [place] W, NP   7 months ago Type 2 diabetes mellitus without complication, with long-term current use of insulin [facility])   [place], Vernia Buff, NP      Future Appointments            In 2 months Gildardo Pounds, NP [place]

## 2021-01-23 ENCOUNTER — Other Ambulatory Visit: Payer: Self-pay

## 2021-01-28 ENCOUNTER — Other Ambulatory Visit: Payer: Self-pay

## 2021-02-02 ENCOUNTER — Inpatient Hospital Stay: Payer: Self-pay

## 2021-02-02 ENCOUNTER — Inpatient Hospital Stay: Payer: Self-pay | Attending: Hematology and Oncology | Admitting: Hematology and Oncology

## 2021-02-02 ENCOUNTER — Other Ambulatory Visit: Payer: Self-pay

## 2021-02-02 VITALS — BP 141/84 | HR 83 | Temp 96.9°F | Resp 18 | Wt 246.3 lb

## 2021-02-02 DIAGNOSIS — E079 Disorder of thyroid, unspecified: Secondary | ICD-10-CM | POA: Insufficient documentation

## 2021-02-02 DIAGNOSIS — E7800 Pure hypercholesterolemia, unspecified: Secondary | ICD-10-CM | POA: Insufficient documentation

## 2021-02-02 DIAGNOSIS — E119 Type 2 diabetes mellitus without complications: Secondary | ICD-10-CM | POA: Insufficient documentation

## 2021-02-02 DIAGNOSIS — D72829 Elevated white blood cell count, unspecified: Secondary | ICD-10-CM | POA: Insufficient documentation

## 2021-02-02 DIAGNOSIS — D72825 Bandemia: Secondary | ICD-10-CM

## 2021-02-02 DIAGNOSIS — L409 Psoriasis, unspecified: Secondary | ICD-10-CM | POA: Insufficient documentation

## 2021-02-02 DIAGNOSIS — I10 Essential (primary) hypertension: Secondary | ICD-10-CM | POA: Insufficient documentation

## 2021-02-02 DIAGNOSIS — Z1239 Encounter for other screening for malignant neoplasm of breast: Secondary | ICD-10-CM

## 2021-02-02 DIAGNOSIS — K219 Gastro-esophageal reflux disease without esophagitis: Secondary | ICD-10-CM | POA: Insufficient documentation

## 2021-02-02 LAB — CBC WITH DIFFERENTIAL (CANCER CENTER ONLY)
Abs Immature Granulocytes: 0.05 10*3/uL (ref 0.00–0.07)
Basophils Absolute: 0.1 10*3/uL (ref 0.0–0.1)
Basophils Relative: 1 %
Eosinophils Absolute: 0.4 10*3/uL (ref 0.0–0.5)
Eosinophils Relative: 4 %
HCT: 44.5 % (ref 36.0–46.0)
Hemoglobin: 15 g/dL (ref 12.0–15.0)
Immature Granulocytes: 1 %
Lymphocytes Relative: 27 %
Lymphs Abs: 2.9 10*3/uL (ref 0.7–4.0)
MCH: 31.9 pg (ref 26.0–34.0)
MCHC: 33.7 g/dL (ref 30.0–36.0)
MCV: 94.7 fL (ref 80.0–100.0)
Monocytes Absolute: 0.7 10*3/uL (ref 0.1–1.0)
Monocytes Relative: 6 %
Neutro Abs: 6.8 10*3/uL (ref 1.7–7.7)
Neutrophils Relative %: 61 %
Platelet Count: 289 10*3/uL (ref 150–400)
RBC: 4.7 MIL/uL (ref 3.87–5.11)
RDW: 13 % (ref 11.5–15.5)
WBC Count: 10.9 10*3/uL — ABNORMAL HIGH (ref 4.0–10.5)
nRBC: 0 % (ref 0.0–0.2)

## 2021-02-02 LAB — CMP (CANCER CENTER ONLY)
ALT: 16 U/L (ref 0–44)
AST: 11 U/L — ABNORMAL LOW (ref 15–41)
Albumin: 3.8 g/dL (ref 3.5–5.0)
Alkaline Phosphatase: 56 U/L (ref 38–126)
Anion gap: 8 (ref 5–15)
BUN: 16 mg/dL (ref 6–20)
CO2: 22 mmol/L (ref 22–32)
Calcium: 9.5 mg/dL (ref 8.9–10.3)
Chloride: 109 mmol/L (ref 98–111)
Creatinine: 0.73 mg/dL (ref 0.44–1.00)
GFR, Estimated: >60 mL/min (ref >60)
Glucose, Bld: 182 mg/dL — ABNORMAL HIGH (ref 70–99)
Potassium: 4.3 mmol/L (ref 3.5–5.1)
Sodium: 139 mmol/L (ref 135–145)
Total Bilirubin: 0.2 mg/dL — ABNORMAL LOW (ref 0.3–1.2)
Total Protein: 6.6 g/dL (ref 6.5–8.1)

## 2021-02-02 LAB — SAVE SMEAR(SSMR), FOR PROVIDER SLIDE REVIEW

## 2021-02-06 ENCOUNTER — Other Ambulatory Visit: Payer: Self-pay | Admitting: Nurse Practitioner

## 2021-02-06 DIAGNOSIS — E039 Hypothyroidism, unspecified: Secondary | ICD-10-CM

## 2021-02-08 NOTE — Progress Notes (Signed)
[place] [phone]   Fax:([phone] [place] NOTE  Patient Care Team: Gildardo Pounds, NP as PCP - General (Nurse Practitioner)  Hematological/Oncological History # Leukocytosis, Neutrophilic Predominance  CHIEF COMPLAINTS/PURPOSE OF CONSULTATION:  "Leukocytosis "  HISTORY OF PRESENTING ILLNESS:  Valerie Reilly 45 y.o. female with medical history significant for GERD, high cholesterol, hypertension, psoriasis, thyroid disease, type 2 diabetes, and smoking who presents for evaluation of leukocytosis.  On review of the previous records Valerie Reilly has had elevated white blood cell count for over 10 years, predominantly of neutrophilic predominance.  She has history of inflammatory disease with psoriasis and is currently an active smoker.  Due to concern for this longstanding leukocytosis she was referred to hematology for further evaluation and management.  On exam today Valerie Reilly reports that she has psoriasis of the elbows which is under relatively good control as it used to be everywhere.  She notes that she may have a diagnosis of rheumatoid arthritis that is not yet formally been diagnosed.  She has stiff joints and has difficulty with that.  She notes that she did have a yeast/bacterial infection approximately 1 month ago and took antibiotics for that.  On further discussion she notes that her father passed away of dementia and there is no other remarkable family medical history.  She is a smoker and actively smokes about 1 to 2 packs/day and is smoked for 30 years.  She only occasionally drinks alcohol.  She currently works as a newspaper delivery person.  She notes that she does take sleeping pills and that she underwent a sleeping study years ago which was negative.  No one in her household complains of snoring.  She also reports that she saw a hematologist in the last 4 to 5 years but is unsure exactly when or where that occurred.  She  otherwise denies any fevers, chills, sweats, nausea, vomiting or diarrhea.  Full 10 point ROS is listed below.  MEDICAL HISTORY:  Past Medical History:  Diagnosis Date   Asthma    Bipolar disorder ([place])    Diabetes mellitus    Diabetes mellitus, type II ([place])    GERD (gastroesophageal reflux disease)    History of borderline personality disorder    Hypercholesteremia    Hypertension    Psoriasis    Psoriasis    Seasonal allergies    Thyroid disease     SURGICAL HISTORY: Past Surgical History:  Procedure Laterality Date   TONSILLECTOMY     TUBAL LIGATION      SOCIAL HISTORY: Social History   Socioeconomic History   Marital status: Divorced    Spouse name: Not on file   Number of children: 1   Years of education: Not on file   Highest education level: Associate degree: occupational, Hotel manager, or vocational program  Occupational History   Not on file  Tobacco Use   Smoking status: Every Day    Packs/day: 1.00    Types: Cigarettes, E-cigarettes   Smokeless tobacco: Never  Vaping Use   Vaping Use: Former  Substance and Sexual Activity   Alcohol use: Yes    Alcohol/week: 0.0 standard drinks    Comment: occasional- a few times a year. 6-7 drinks per episdoe   Drug use: No    Types: Marijuana    Comment: last time was 3 yrs ago   Sexual activity: Yes    Birth control/protection: Surgical  Other Topics Concern   Not on file  Social History Narrative   Not on file   Social Determinants of Health   Financial Resource Strain: Not on file  Food Insecurity: Not on file  Transportation Needs: No Transportation Needs   Lack of Transportation (Medical): No   Lack of Transportation (Non-Medical): No  Physical Activity: Not on file  Stress: Not on file  Social Connections: Not on file  Intimate Partner Violence: Not on file    FAMILY HISTORY: Family History  Problem Relation Age of Onset   Personality disorder Mother    Alcohol abuse Maternal Uncle     Depression Maternal Uncle     ALLERGIES:  has No Known Allergies.  MEDICATIONS:  Current Outpatient Medications  Medication Sig Dispense Refill   fexofenadine (ALLEGRA) 180 MG tablet Take 180 mg by mouth daily.     hydroxypropyl methylcellulose / hypromellose (ISOPTO TEARS / GONIOVISC) 2.5 % ophthalmic solution Place 1 drop into both eyes in the morning and at bedtime.     benazepril (LOTENSIN) 20 MG tablet Take 1 tablet by mouth once daily 90 tablet 0   busPIRone (BUSPAR) 30 MG tablet TAKE 1 TABLET (30 MG TOTAL) BY MOUTH 2 (TWO) TIMES DAILY. 60 tablet 2   cholecalciferol (VITAMIN D) 1000 UNITS tablet Take 1,000 Units by mouth daily.     Famotidine (PEPCID PO) Take by mouth.     fluticasone (FLONASE) 50 MCG/ACT nasal spray PLACE 2 SPRAYS INTO BOTH NOSTRILS DAILY. 16 g 1   glipiZIDE (GLUCOTROL XL) 10 MG 24 hr tablet Take 1 tablet by mouth once daily with breakfast 90 tablet 1   Guselkumab 100 MG/ML SOSY INJECT 100MG /1ML INTO THE SKIN AT WEEK 0 AND 4 AND THEN 8 WEEKS THEREAFTER. 3 mL 6   levothyroxine (SYNTHROID) 50 MCG tablet TAKE 1 TABLET BY MOUTH ONCE DAILY BEFORE BREAKFAST 30 tablet 2   lithium carbonate 300 MG capsule Take 2 capsules by mouth 2 (two) times daily. 120 capsule 2   metFORMIN (GLUCOPHAGE) 1000 MG tablet Take 1 tablet by mouth once daily with breakfast 90 tablet 0   Multiple Vitamin (MULTIVITAMIN) capsule Take 1 capsule by mouth daily.     simvastatin (ZOCOR) 40 MG tablet Take 1 tablet by mouth once daily 90 tablet 0   traZODone (DESYREL) 150 MG tablet TAKE 2 & 1/2 TABLETS BY MOUTH AT BEDTIME 75 tablet 2   No current facility-administered medications for this visit.    REVIEW OF SYSTEMS:   Constitutional: ( - ) fevers, ( - )  chills , ( - ) night sweats Eyes: ( - ) blurriness of vision, ( - ) double vision, ( - ) watery eyes Ears, nose, mouth, throat, and face: ( - ) mucositis, ( - ) sore throat Respiratory: ( - ) cough, ( - ) dyspnea, ( - ) wheezes Cardiovascular: ( -  ) palpitation, ( - ) chest discomfort, ( - ) lower extremity swelling Gastrointestinal:  ( - ) nausea, ( - ) heartburn, ( - ) change in bowel habits Skin: ( - ) abnormal skin rashes Lymphatics: ( - ) new lymphadenopathy, ( - ) easy bruising Neurological: ( - ) numbness, ( - ) tingling, ( - ) new weaknesses Behavioral/Psych: ( - ) mood change, ( - ) new changes  All other systems were reviewed with the patient and are negative.  PHYSICAL EXAMINATION: ECOG PERFORMANCE STATUS: 0 - Asymptomatic  Vitals:   02/02/21 0934  BP: (!) 141/84  Pulse: 83  Resp: 18  Temp: (!) 96.9  F (36.1 C)  SpO2: 99%   Filed Weights   02/02/21 0934  Weight: 246 lb 4.8 oz (111.7 kg)    GENERAL: well appearing middle-aged Caucasian female in NAD  SKIN: Dry flaking skin on the elbows, otherwise skin color, texture, turgor are normal, no rashes or significant lesions EYES: conjunctiva are pink and non-injected, sclera clear LUNGS: clear to auscultation and percussion with normal breathing effort HEART: regular rate & rhythm and no murmurs and no lower extremity edema ABDOMEN: soft, non-tender, non-distended, normal bowel sounds Musculoskeletal: no cyanosis of digits and no clubbing  PSYCH: alert & oriented x 3, fluent speech NEURO: no focal motor/sensory deficits  LABORATORY DATA:  I have reviewed the data as listed CBC Latest Ref Rng & Units 02/02/2021 01/09/2021 06/09/2020  WBC 4.0 - 10.5 K/uL 10.9(H) 16.5(H) 12.6(H)  Hemoglobin 12.0 - 15.0 g/dL 15.0 16.2(H) 15.7  Hematocrit 36.0 - 46.0 % 44.5 46.4 45.0  Platelets 150 - 400 K/uL 289 287 310    CMP Latest Ref Rng & Units 02/02/2021 01/09/2021 06/09/2020  Glucose 70 - 99 mg/dL 182(H) 168(H) 144(H)  BUN 6 - 20 mg/dL 16 10 10   Creatinine 0.44 - 1.00 mg/dL 0.73 0.69 0.66  Sodium 135 - 145 mmol/L 139 139 137  Potassium 3.5 - 5.1 mmol/L 4.3 4.2 4.7  Chloride 98 - 111 mmol/L 109 102 103  CO2 22 - 32 mmol/L 22 21 20   Calcium 8.9 - 10.3 mg/dL 9.5 10.3(H) 9.5   Total Protein 6.5 - 8.1 g/dL 6.6 7.1 6.8  Total Bilirubin 0.3 - 1.2 mg/dL 0.2(L) 0.6 0.5  Alkaline Phos 38 - 126 U/L 56 75 68  AST 15 - 41 U/L 11(L) 16 15  ALT 0 - 44 U/L 16 19 13     RADIOGRAPHIC STUDIES: No results found.  ASSESSMENT & PLAN Nikeya Maxim Lipsitz 45 y.o. female with medical history significant for GERD, high cholesterol, hypertension, psoriasis, thyroid disease, type 2 diabetes, and smoking who presents for evaluation of leukocytosis.  After review of the labs, review of the records, and discussion with the patient the patients findings are most consistent with leukocytosis secondary to smoking or underlying inflammatory condition.  At this time there is low suspicion of MPN or other driving cause for the patient's leukocytosis.  Therefore we would recommend continued observation in clinic with her primary care provider.  She would develop worsening leukocytosis or changes in her other blood counts we would encourage prompt referral back to our clinic.  # Leukocytosis, Neutrophilic Predominance -- Leukocytosis has been present for greater than 10 years and has been relatively stable over that period of time.  It has been of neutrophilic predominance. --Strongly suspect that these findings are due to smoking, though there may be a component of inflammation from the patient psoriasis --Today we will order CBC and CMP. --No clear indication for a MPN work-up.  If leukocytosis were to worsen or if he were to develop other hematological abnormalities we recommend prompt referral back to our clinic. --Return to clinic as needed  Orders Placed This Encounter  Procedures   CBC with Differential ([place] Only)    Standing Status:   Future    Number of Occurrences:   1    Standing Expiration Date:   02/02/2022   CMP ([place] only)    Standing Status:   Future    Number of Occurrences:   1    Standing Expiration Date:   02/02/2022   Save Smear [facility])  Standing  Status:   Future    Number of Occurrences:   1    Standing Expiration Date:   02/02/2022    All questions were answered. The patient knows to call the clinic with any problems, questions or concerns.  A total of more than 45 minutes were spent on this encounter with face-to-face time and non-face-to-face time, including preparing to see the patient, ordering tests and/or medications, counseling the patient and coordination of care as outlined above.   Ledell Peoples, MD Department of Hematology/Oncology [place] [facility] Phone: [phone] Pager: [phone] Email: [email or web address]  02/08/2021 6:27 PM

## 2021-02-09 ENCOUNTER — Other Ambulatory Visit: Payer: Self-pay

## 2021-02-09 ENCOUNTER — Telehealth: Payer: Self-pay | Admitting: *Deleted

## 2021-02-09 DIAGNOSIS — Z1231 Encounter for screening mammogram for malignant neoplasm of breast: Secondary | ICD-10-CM

## 2021-02-09 NOTE — Telephone Encounter (Signed)
Received call from pt regarding her lab results from last week. Advised that I would have Dr. Lorenso Courier review the results and then I would give her a call back  TCT patient after speaking with Dr. Lorenso Courier. Spoke with her and advised that her elevated WBC is likely related to her smoking and/or her psoriasis. Pt asked if their was any financial assistance for her visit last week. Provided name and # of Financial Advocate Stefanie Libel for her to speak to. Pt states hshe has not insurance.

## 2021-02-13 ENCOUNTER — Telehealth (INDEPENDENT_AMBULATORY_CARE_PROVIDER_SITE_OTHER): Payer: No Payment, Other | Admitting: Physician Assistant

## 2021-02-13 ENCOUNTER — Encounter (HOSPITAL_COMMUNITY): Payer: Self-pay | Admitting: Physician Assistant

## 2021-02-13 DIAGNOSIS — F5105 Insomnia due to other mental disorder: Secondary | ICD-10-CM

## 2021-02-13 DIAGNOSIS — F411 Generalized anxiety disorder: Secondary | ICD-10-CM | POA: Diagnosis not present

## 2021-02-13 DIAGNOSIS — F319 Bipolar disorder, unspecified: Secondary | ICD-10-CM

## 2021-02-13 DIAGNOSIS — F99 Mental disorder, not otherwise specified: Secondary | ICD-10-CM

## 2021-02-13 MED ORDER — LITHIUM CARBONATE 300 MG PO CAPS
ORAL_CAPSULE | Freq: Two times a day (BID) | ORAL | 2 refills | Status: DC
  Filled 2021-02-13 – 2021-03-27 (×2): qty 120, 30d supply, fill #0
  Filled 2021-03-27: qty 120, fill #0
  Filled 2021-05-23: qty 120, 30d supply, fill #1

## 2021-02-13 MED ORDER — BUSPIRONE HCL 30 MG PO TABS
ORAL_TABLET | Freq: Two times a day (BID) | ORAL | 2 refills | Status: DC
  Filled 2021-02-13 – 2021-04-09 (×3): qty 60, 30d supply, fill #0

## 2021-02-13 MED ORDER — TRAZODONE HCL 150 MG PO TABS
ORAL_TABLET | ORAL | 2 refills | Status: DC
  Filled 2021-02-13 – 2021-04-09 (×3): qty 75, 30d supply, fill #0
  Filled 2021-05-23: qty 75, 30d supply, fill #1

## 2021-02-13 NOTE — Progress Notes (Addendum)
BH MD/PA/NP OP Progress Note  Virtual Visit via Telephone Note  I connected with Valerie Reilly on 02/13/21 at 11:00 AM EST by telephone and verified that I am speaking with the correct person using two identifiers.  Location: Patient: Home Provider: Clinic   I discussed the limitations, risks, security and privacy concerns of performing an evaluation and management service by telephone and the availability of in person appointments. I also discussed with the patient that there may be a patient responsible charge related to this service. The patient expressed understanding and agreed to proceed.  Follow Up Instructions:  I discussed the assessment and treatment plan with the patient. The patient was provided an opportunity to ask questions and all were answered. The patient agreed with the plan and demonstrated an understanding of the instructions.   The patient was advised to call back or seek an in-person evaluation if the symptoms worsen or if the condition fails to improve as anticipated.  I provided 12 minutes of non-face-to-face time during this encounter.  [name], PA    02/13/2021 6:51 PM Valerie Reilly  MRN:  [id number]  Chief Complaint: Follow up and medication management  HPI:   Valerie Reilly is a 45 year old female with a past psychiatric history significant for generalized anxiety disorder, bipolar 1 disorder, and insomnia who presents to [facility] via virtual telephone visit for follow-up and medication management.  Patient is currently being managed on the following medications:  Lithium 300 mg 2 times daily Buspirone 30 mg 2 times daily Trazodone 150 mg at bedtime  Patient reports no issues or concerns regarding her current medication regimen.  Patient denies the need for dosage adjustments at this time and is requesting refills on all her medications following the conclusion of the encounter.  Patient  denies experiencing any depressive symptoms.  She endorses mild anxiety and rates her anxiety a 3 out of 10.  Patient denies any new stressors at this time.  Patient states that she is currently under the weather and experiencing a little bit of a sore throat and congestion, but otherwise is doing okay.  Had 7 screen was performed with the patient scoring a 2.  Patient is alert and oriented x4, calm, cooperative, and fully engaged in conversation during the encounter.  Patient states that her mental health is doing fine.  Patient denies suicidal or homicidal ideations.  She further denies auditory or visual hallucinations and does not appear to be responding to internal/external stimuli.  Patient endorses good sleep and receives on average 7 to 8 hours of sleep each night.  Patient endorses good appetite and eats on average 2 meals per day.  Patient endorses alcohol consumption occasionally.  She further endorses tobacco use and smokes on average a pack per day.  Patient denies illicit drug use.  Visit Diagnosis:    ICD-10-CM   1. GAD (generalized anxiety disorder)  F41.1 busPIRone (BUSPAR) 30 MG tablet    2. Bipolar I disorder (HCC)  F31.9 lithium carbonate 300 MG capsule    traZODone (DESYREL) 150 MG tablet    3. Insomnia due to other mental disorder  F51.05 traZODone (DESYREL) 150 MG tablet   F99       Past Psychiatric History:  Bipolar disorder Generalized anxiety disorder Insomnia  Past Medical History:  Past Medical History:  Diagnosis Date   Asthma    Bipolar disorder ([place])    Diabetes mellitus    Diabetes mellitus, type  II ([place])    GERD (gastroesophageal reflux disease)    History of borderline personality disorder    Hypercholesteremia    Hypertension    Psoriasis    Psoriasis    Seasonal allergies    Thyroid disease     Past Surgical History:  Procedure Laterality Date   TONSILLECTOMY     TUBAL LIGATION      Family Psychiatric History:  Mother - Personality  disorder Uncle (maternal) - Alcohol abuse Uncle (maternal) - Depression  Family History:  Family History  Problem Relation Age of Onset   Personality disorder Mother    Alcohol abuse Maternal Uncle    Depression Maternal Uncle     Social History:  Social History   Socioeconomic History   Marital status: Divorced    Spouse name: Not on file   Number of children: 1   Years of education: Not on file   Highest education level: Associate degree: occupational, Hotel manager, or vocational program  Occupational History   Not on file  Tobacco Use   Smoking status: Every Day    Packs/day: 1.00    Types: Cigarettes, E-cigarettes   Smokeless tobacco: Never  Vaping Use   Vaping Use: Former  Substance and Sexual Activity   Alcohol use: Yes    Alcohol/week: 0.0 standard drinks    Comment: occasional- a few times a year. 6-7 drinks per episdoe   Drug use: No    Types: Marijuana    Comment: last time was 3 yrs ago   Sexual activity: Yes    Birth control/protection: Surgical  Other Topics Concern   Not on file  Social History Narrative   Not on file   Social Determinants of Health   Financial Resource Strain: Not on file  Food Insecurity: Not on file  Transportation Needs: No Transportation Needs   Lack of Transportation (Medical): No   Lack of Transportation (Non-Medical): No  Physical Activity: Not on file  Stress: Not on file  Social Connections: Not on file    Allergies: No Known Allergies  Metabolic Disorder Labs: Lab Results  Component Value Date   HGBA1C 7.0 (A) 01/09/2021   No results found for: PROLACTIN Lab Results  Component Value Date   CHOL 184 01/09/2021   TRIG 183 (H) 01/09/2021   HDL 37 (L) 01/09/2021   CHOLHDL 5.0 (H) 01/09/2021   LDLCALC 115 (H) 01/09/2021   LDLCALC 91 06/09/2020   Lab Results  Component Value Date   TSH 1.350 04/08/2020   TSH 1.870 12/04/2019    Therapeutic Level Labs: Lab Results  Component Value Date   LITHIUM 0.8  10/13/2020   LITHIUM 0.8 12/04/2019   No results found for: VALPROATE No components found for:  CBMZ  Current Medications: Current Outpatient Medications  Medication Sig Dispense Refill   benazepril (LOTENSIN) 20 MG tablet Take 1 tablet by mouth once daily 90 tablet 0   busPIRone (BUSPAR) 30 MG tablet TAKE 1 TABLET (30 MG TOTAL) BY MOUTH 2 (TWO) TIMES DAILY. 60 tablet 2   cholecalciferol (VITAMIN D) 1000 UNITS tablet Take 1,000 Units by mouth daily.     Famotidine (PEPCID PO) Take by mouth.     fexofenadine (ALLEGRA) 180 MG tablet Take 180 mg by mouth daily.     fluticasone (FLONASE) 50 MCG/ACT nasal spray PLACE 2 SPRAYS INTO BOTH NOSTRILS DAILY. 16 g 1   glipiZIDE (GLUCOTROL XL) 10 MG 24 hr tablet Take 1 tablet by mouth once daily with breakfast 90 tablet  1   hydroxypropyl methylcellulose / hypromellose (ISOPTO TEARS / GONIOVISC) 2.5 % ophthalmic solution Place 1 drop into both eyes in the morning and at bedtime.     levothyroxine (SYNTHROID) 50 MCG tablet TAKE 1 TABLET BY MOUTH ONCE DAILY BEFORE BREAKFAST 30 tablet 2   lithium carbonate 300 MG capsule Take 2 capsules by mouth 2 (two) times daily. 120 capsule 2   metFORMIN (GLUCOPHAGE) 1000 MG tablet Take 1 tablet by mouth once daily with breakfast 90 tablet 0   Multiple Vitamin (MULTIVITAMIN) capsule Take 1 capsule by mouth daily.     simvastatin (ZOCOR) 40 MG tablet Take 1 tablet by mouth once daily 90 tablet 0   traZODone (DESYREL) 150 MG tablet TAKE 2 & 1/2 TABLETS BY MOUTH AT BEDTIME 75 tablet 2   No current facility-administered medications for this visit.     Musculoskeletal: Strength & Muscle Tone: Unable to assess due to telemedicine visit [place]: Unable to assess due to telemedicine visit Patient leans: Unable to assess due to telemedicine visit  Psychiatric Specialty Exam: Review of Systems  Psychiatric/Behavioral:  Negative for decreased concentration, dysphoric mood, hallucinations, self-injury, sleep  disturbance and suicidal ideas. The patient is not nervous/anxious and is not hyperactive.    There were no vitals taken for this visit.There is no height or weight on file to calculate BMI.  General Appearance: Unable to assess due to telemedicine visit  Eye Contact:  Unable to assess due to telemedicine visit  Speech:  Clear and Coherent and Normal Rate  Volume:  Normal  Mood:  Euthymic  Affect:  Appropriate  Thought Process:  Coherent and Descriptions of Associations: Intact  Orientation:  Full (Time, Place, and Person)  Thought Content: WDL   Suicidal Thoughts:  No  Homicidal Thoughts:  No  Memory:  Immediate;   Good Recent;   Good Remote;   Good  Judgement:  Fair  Insight:  Fair  Psychomotor Activity:  Normal  Concentration:  Concentration: Good and Attention Span: Good  Recall:  Good  Fund of Knowledge: Good  Language: Good  Akathisia:  Negative  Handed:  Right  AIMS (if indicated): not done  Assets:  Communication Skills Desire for Improvement Financial Resources/Insurance Housing  ADL's:  Intact  Cognition: WNL  Sleep:  Good   Screenings: GAD-7    Flowsheet Row Video Visit from 02/13/2021 in [facility] Video Visit from 11/14/2020 in [facility]  Total GAD-7 Score 2 1      PHQ2-9    Flowsheet Row Video Visit from 02/13/2021 in [facility] Video Visit from 11/14/2020 in [place]  PHQ-2 Total Score 0 0      Flowsheet Row Video Visit from 02/13/2021 in [facility] Video Visit from 11/14/2020 in [place] and Plan:   Valerie Reilly is a 45 year old female with a past psychiatric history significant for generalized anxiety disorder, bipolar 1 disorder, and insomnia who presents to [facility] via virtual telephone visit for follow-up and medication management.  Patient reports no issues or concerns regarding her current medication regimen.  Patient denies the need for dosage adjustments at this time and is requesting refills on all her medications following the conclusion of the encounter.  Patient denies depressive symptoms and only endorses mild  anxiety.  Patient appears to be stable on her medications.  Patient's medications to be e-prescribed to pharmacy of choice.  1. GAD (generalized anxiety disorder)  - busPIRone (BUSPAR) 30 MG tablet; TAKE 1 TABLET (30 MG TOTAL) BY MOUTH 2 (TWO) TIMES DAILY.  Dispense: 60 tablet; Refill: 2  2. Bipolar I disorder (HCC)  - lithium carbonate 300 MG capsule; Take 2 capsules by mouth 2 (two) times daily.  Dispense: 120 capsule; Refill: 2 - traZODone (DESYREL) 150 MG tablet; TAKE 2 & 1/2 TABLETS BY MOUTH AT BEDTIME  Dispense: 75 tablet; Refill: 2  3. Insomnia due to other mental disorder  - traZODone (DESYREL) 150 MG tablet; TAKE 2 & 1/2 TABLETS BY MOUTH AT BEDTIME  Dispense: 75 tablet; Refill: 2  Patient to follow up in 4 months Provider spent a total of 12 minutes with the patient/reviewing patient's chart  [name], PA 02/13/2021, 6:51 PM

## 2021-02-16 ENCOUNTER — Other Ambulatory Visit: Payer: Self-pay

## 2021-02-23 ENCOUNTER — Other Ambulatory Visit: Payer: Self-pay

## 2021-03-03 ENCOUNTER — Other Ambulatory Visit: Payer: Self-pay

## 2021-03-03 ENCOUNTER — Telehealth: Payer: Self-pay | Admitting: Nurse Practitioner

## 2021-03-03 NOTE — Telephone Encounter (Signed)
Pt wanted to know why this Rx was denied, stated she spoke with the pharmacy and they were unable to tell her any information. Please advise.

## 2021-03-04 ENCOUNTER — Other Ambulatory Visit: Payer: Self-pay

## 2021-03-05 ENCOUNTER — Other Ambulatory Visit: Payer: Self-pay

## 2021-03-09 ENCOUNTER — Other Ambulatory Visit: Payer: Self-pay | Admitting: Nurse Practitioner

## 2021-03-09 DIAGNOSIS — E785 Hyperlipidemia, unspecified: Secondary | ICD-10-CM

## 2021-03-21 ENCOUNTER — Other Ambulatory Visit: Payer: Self-pay | Admitting: Nurse Practitioner

## 2021-03-21 DIAGNOSIS — E119 Type 2 diabetes mellitus without complications: Secondary | ICD-10-CM

## 2021-03-21 NOTE — Telephone Encounter (Signed)
Requested Prescriptions  Pending Prescriptions Disp Refills   glipiZIDE (GLUCOTROL XL) 10 MG 24 hr tablet [Pharmacy Med Name: glipiZIDE ER 10 MG Oral Tablet Extended Release 24 Hour] 90 tablet 0    Sig: Take 1 tablet by mouth once daily with breakfast     Endocrinology:  Diabetes - Sulfonylureas Passed - 03/21/2021 12:45 PM      Passed - HBA1C is between 0 and 7.9 and within 180 days    Hemoglobin A1C  Date Value Ref Range Status  01/09/2021 7.0 (A) 4.0 - 5.6 % Final   Hgb A1c MFr Bld  Date Value Ref Range Status  06/09/2020 6.2 (H) 4.8 - 5.6 % Final    Comment:             Prediabetes: 5.7 - 6.4          Diabetes: >6.4          Glycemic control for adults with diabetes: <7.0          Passed - Valid encounter within last 6 months    Recent Outpatient Visits          2 months ago Leukocytosis, unspecified type   [place], Vernia Buff, NP   2 months ago Primary hypertension   [place], Vernia Buff, NP   3 months ago Upper respiratory infection, viral   [place], Vernia Buff, NP   5 months ago Encounter for Papanicolaou smear for cervical cancer screening   [place], [place] W, NP   9 months ago Type 2 diabetes mellitus without complication, with long-term current use of insulin [facility])   [place], Vernia Buff, NP      Future Appointments            In 3 weeks Gildardo Pounds, NP [place]

## 2021-03-27 ENCOUNTER — Other Ambulatory Visit (HOSPITAL_COMMUNITY): Payer: Self-pay

## 2021-03-27 ENCOUNTER — Other Ambulatory Visit: Payer: Self-pay

## 2021-04-01 ENCOUNTER — Other Ambulatory Visit: Payer: Self-pay

## 2021-04-02 ENCOUNTER — Other Ambulatory Visit: Payer: Self-pay

## 2021-04-07 ENCOUNTER — Other Ambulatory Visit: Payer: Self-pay

## 2021-04-07 ENCOUNTER — Telehealth: Payer: Self-pay

## 2021-04-07 ENCOUNTER — Ambulatory Visit: Payer: Self-pay | Admitting: *Deleted

## 2021-04-07 ENCOUNTER — Ambulatory Visit
Admission: RE | Admit: 2021-04-07 | Discharge: 2021-04-07 | Disposition: A | Discharge status: Home / Self Care | Payer: No Typology Code available for payment source | Source: Ambulatory Visit | Attending: Obstetrics and Gynecology | Admitting: Obstetrics and Gynecology

## 2021-04-07 VITALS — BP 128/84 | Wt 235.3 lb

## 2021-04-07 DIAGNOSIS — Z1231 Encounter for screening mammogram for malignant neoplasm of breast: Secondary | ICD-10-CM

## 2021-04-07 DIAGNOSIS — Z1239 Encounter for other screening for malignant neoplasm of breast: Secondary | ICD-10-CM

## 2021-04-07 NOTE — Telephone Encounter (Signed)
FIT test results requested and received from [facility]. Patient informed negative FIT test results. Patient verbalized understanding.

## 2021-04-07 NOTE — Patient Instructions (Signed)
Explained breast self awareness with Valerie Reilly. Patient did not need a Pap smear today due to last Pap smear was 10/13/2020. Lt patient know that her next Pap smear is due in 3 years due to her previous Pap smear result and per PCP. Referred patient to the [place] for a screening mammogram on mobile unit. Appointment scheduled Tuesday, April 07, 2021 at 1030. Patient aware of appointment and will be there. Let patient know the [facility] will follow up with her within the next couple weeks with results of her mammogram by letter or phone. Valerie Reilly verbalized understanding.  Zorawar Strollo, Arvil Chaco, RN 9:47 AM

## 2021-04-07 NOTE — Progress Notes (Signed)
Valerie Reilly is a 46 y.o. female who presents to [facility] clinic today with no complaints.    Pap Smear: Pap smear not completed today. Last Pap smear was 10/13/2020 at [facility] and was abnormal - ASCUS with negative HPV . Per patient has no history of an abnormal Pap smear prior to her most recent Pap smear. Last Pap smear result is available in Epic.   Physical exam: Breasts Left breast larger than right breast that per patient is normal for her. No skin abnormalities bilateral breasts. No nipple retraction bilateral breasts. No nipple discharge bilateral breasts. No lymphadenopathy. No lumps palpated bilateral breasts. No complaints of pain or tenderness on exam.  MM 3D SCREEN BREAST BILATERAL  Result Date: 08/12/2017 CLINICAL DATA:  Screening. EXAM: DIGITAL SCREENING BILATERAL MAMMOGRAM WITH TOMO AND CAD COMPARISON:  Previous exam(s). ACR Breast Density Category b: There are scattered areas of fibroglandular density. FINDINGS: There are no findings suspicious for malignancy. Images were processed with CAD. IMPRESSION: No mammographic evidence of malignancy. A result letter of this screening mammogram will be mailed directly to the patient. RECOMMENDATION: Screening mammogram in one year. (Code:SM-B-01Y) BI-RADS CATEGORY  1: Negative. Electronically Signed   By: Lovey Newcomer M.D.   On: 08/12/2017 17:18   MS DIGITAL SCREENING TOMO BILATERAL  Result Date: 03/12/2020 CLINICAL DATA:  Screening. EXAM: DIGITAL SCREENING BILATERAL MAMMOGRAM WITH TOMO AND CAD COMPARISON:  Previous exam(s). ACR Breast Density Category b: There are scattered areas of fibroglandular density. FINDINGS: There are no findings suspicious for malignancy. Images were processed with CAD. IMPRESSION: No mammographic evidence of malignancy. A result letter of this screening mammogram will be mailed directly to the patient. RECOMMENDATION: Screening mammogram in one year. (Code:SM-B-01Y) BI-RADS  CATEGORY  1: Negative. Electronically Signed   By: Kristopher Oppenheim M.D.   On: 03/12/2020 09:00       Pelvic/Bimanual Pap is not indicated today per BCCCP guidelines.   Smoking History: Patient is a current smoker. Discussed smoking cessation with patient. Referred to the [facility] Quitline.   Patient Navigation: Patient education provided. Access to services provided for patient through [place] program.   Colorectal Cancer Screening: Per patient has never had colonoscopy completed. Patient completed a FIT Test 10/13/2020 that she has not received the result. No complaints today.    Breast and Cervical Cancer Risk Assessment: Patient has family history of breast cancer, known genetic mutations, or radiation treatment to the chest before age 78. Patient does not have history of cervical dysplasia, immunocompromised, or DES exposure in-utero.  Risk Assessment     Risk Scores       04/07/2021 03/11/2020   Last edited by: Demetrius Revel, LPN Williams, B'Nai C, RT   5-year risk: 0.6 % 0.6 %   Lifetime risk: 6.8 % 6.9 %            A: BCCCP exam without pap smear No complaints.  P: Referred patient to the [place] for a screening mammogram on mobile unit. Appointment scheduled Tuesday, April 07, 2021 at 1030.  Loletta Parish, RN 04/07/2021 9:47 AM

## 2021-04-09 ENCOUNTER — Other Ambulatory Visit: Payer: Self-pay

## 2021-04-13 ENCOUNTER — Encounter: Payer: Self-pay | Admitting: Nurse Practitioner

## 2021-04-13 ENCOUNTER — Other Ambulatory Visit (HOSPITAL_COMMUNITY)
Admission: RE | Admit: 2021-04-13 | Discharge: 2021-04-13 | Disposition: A | Discharge status: Home / Self Care | Payer: No Typology Code available for payment source | Source: Ambulatory Visit | Attending: Nurse Practitioner | Admitting: Nurse Practitioner

## 2021-04-13 ENCOUNTER — Other Ambulatory Visit: Payer: Self-pay | Admitting: Pharmacist

## 2021-04-13 ENCOUNTER — Other Ambulatory Visit: Payer: Self-pay

## 2021-04-13 ENCOUNTER — Ambulatory Visit: Payer: Self-pay | Attending: Nurse Practitioner | Admitting: Nurse Practitioner

## 2021-04-13 VITALS — BP 142/82 | HR 81 | Ht 66.0 in | Wt 234.1 lb

## 2021-04-13 DIAGNOSIS — E119 Type 2 diabetes mellitus without complications: Secondary | ICD-10-CM

## 2021-04-13 DIAGNOSIS — Z794 Long term (current) use of insulin: Secondary | ICD-10-CM

## 2021-04-13 DIAGNOSIS — Z7251 High risk heterosexual behavior: Secondary | ICD-10-CM | POA: Insufficient documentation

## 2021-04-13 DIAGNOSIS — E039 Hypothyroidism, unspecified: Secondary | ICD-10-CM

## 2021-04-13 DIAGNOSIS — Z1211 Encounter for screening for malignant neoplasm of colon: Secondary | ICD-10-CM

## 2021-04-13 LAB — POCT GLYCOSYLATED HEMOGLOBIN (HGB A1C): Hemoglobin A1C: 8.5 % — AB (ref 4.0–5.6)

## 2021-04-13 LAB — GLUCOSE, POCT (MANUAL RESULT ENTRY): POC Glucose: 251 mg/dl — AB (ref 70–99)

## 2021-04-13 MED ORDER — TREMFYA 100 MG/ML ~~LOC~~ SOAJ
SUBCUTANEOUS | 11 refills | Status: DC
  Filled 2021-04-13: qty 0.28, 28d supply, fill #0

## 2021-04-13 MED ORDER — METFORMIN HCL 1000 MG PO TABS: 1000.0000 mg | ORAL_TABLET | Freq: Two times a day (BID) | ORAL | 1 refills | Status: DC

## 2021-04-13 MED ORDER — TREMFYA 100 MG/ML ~~LOC~~ SOAJ
SUBCUTANEOUS | 11 refills | Status: DC
  Filled 2021-04-13: qty 1, fill #0
  Filled 2021-04-14: qty 1, 28d supply, fill #0
  Filled 2021-05-28: qty 1, 28d supply, fill #1

## 2021-04-13 NOTE — Progress Notes (Signed)
Assessment & Plan:  Valerie Reilly was seen today for diabetes.  Diagnoses and all orders for this visit:  Type 2 diabetes mellitus without complication, with long-term current use of insulin (HCC) -     POCT glycosylated hemoglobin (Hb A1C) -     POCT glucose (manual entry) -     metFORMIN (GLUCOPHAGE) 1000 MG tablet; Take 1 tablet (1,000 mg total) by mouth 2 (two) times daily with a meal. Continue blood sugar control as discussed in office today, low carbohydrate diet, and regular physical exercise as tolerated, 150 minutes per week (30 min each day, 5 days per week, or 50 min 3 days per week). Keep blood sugar logs with fasting goal of 90-130 mg/dl, post prandial (after you eat) less than 180.  For Hypoglycemia: BS <60 and Hyperglycemia BS >400; contact the clinic ASAP. Annual eye exams and foot exams are recommended.   Screening for colon cancer -     Fecal occult blood, imunochemical  High risk heterosexual behavior -     HIV antibody (with reflex) -     RPR -     Cervicovaginal ancillary only  Hypothyroidism, unspecified type -     Thyroid Panel With TSH   Patient has been counseled on age-appropriate routine health concerns for screening and prevention. These are reviewed and up-to-date. Referrals have been placed accordingly. Immunizations are up-to-date or declined.    Subjective:   Chief Complaint  Patient presents with   Diabetes   Valerie Reilly 46 y.o. female presents to office today for follow-up to diabetes. She is requesting STD testing today including HIV and RPR.  She is requesting a refill of her Tremfya today for her psoriasis.   She is followed by behavioral health for generalized anxiety disorder, bipolar disorder I and insomnia.  All  DM 2 Diabetes is poorly controlled.  She endorses medication adherence taking Glucophage 1000 mg daily and glipizide 10 mg daily.  Will increase dosage to 1000 mg twice daily at this time. Lab Results  Component Value  Date   HGBA1C 8.5 (A) 04/13/2021     HTN Blood pressure is slightly elevated today. She is taking lotensin 20 mg daily as prescribed.   BP Readings from Last 3 Encounters:  04/13/21 (!) 142/82  04/07/21 128/84  02/02/21 (!) 141/84    Review of Systems  Constitutional:  Negative for fever, malaise/fatigue and weight loss.  HENT: Negative.  Negative for nosebleeds.   Eyes: Negative.  Negative for blurred vision, double vision and photophobia.  Respiratory: Negative.  Negative for cough and shortness of breath.   Cardiovascular: Negative.  Negative for chest pain, palpitations and leg swelling.  Gastrointestinal: Negative.  Negative for heartburn, nausea and vomiting.  Musculoskeletal: Negative.  Negative for myalgias.  Skin:        PSORIASIS  Neurological: Negative.  Negative for dizziness, focal weakness, seizures and headaches.  Psychiatric/Behavioral:  Positive for depression. Negative for suicidal ideas. The patient is nervous/anxious and has insomnia.    Past Medical History:  Diagnosis Date   Asthma    Bipolar disorder ([place])    Diabetes mellitus    Diabetes mellitus, type II ([place])    GERD (gastroesophageal reflux disease)    History of borderline personality disorder    Hypercholesteremia    Hypertension    Psoriasis    Psoriasis    Seasonal allergies    Thyroid disease     Past Surgical History:  Procedure Laterality Date   TONSILLECTOMY  TUBAL LIGATION      Family History  Problem Relation Age of Onset   Personality disorder Mother    Alcohol abuse Maternal Uncle    Depression Maternal Uncle    Breast cancer Neg Hx     Social History Reviewed with no changes to be made today.   Outpatient Medications Prior to Visit  Medication Sig Dispense Refill   benazepril (LOTENSIN) 20 MG tablet Take 1 tablet by mouth once daily 90 tablet 0   busPIRone (BUSPAR) 30 MG tablet TAKE 1 TABLET (30 MG TOTAL) BY MOUTH 2 (TWO) TIMES DAILY. 60 tablet 2   cholecalciferol  (VITAMIN D) 1000 UNITS tablet Take 1,000 Units by mouth daily.     Famotidine (PEPCID PO) Take by mouth.     fexofenadine (ALLEGRA) 180 MG tablet Take 180 mg by mouth daily.     glipiZIDE (GLUCOTROL XL) 10 MG 24 hr tablet Take 1 tablet by mouth once daily with breakfast 90 tablet 0   hydroxypropyl methylcellulose / hypromellose (ISOPTO TEARS / GONIOVISC) 2.5 % ophthalmic solution Place 1 drop into both eyes in the morning and at bedtime.     levothyroxine (SYNTHROID) 50 MCG tablet TAKE 1 TABLET BY MOUTH ONCE DAILY BEFORE BREAKFAST 30 tablet 2   lithium carbonate 300 MG capsule Take 2 capsules by mouth 2 (two) times daily. 120 capsule 2   Multiple Vitamin (MULTIVITAMIN) capsule Take 1 capsule by mouth daily.     simvastatin (ZOCOR) 40 MG tablet Take 1 tablet by mouth once daily 90 tablet 1   traZODone (DESYREL) 150 MG tablet TAKE 2 & 1/2 TABLETS BY MOUTH AT BEDTIME 75 tablet 2   metFORMIN (GLUCOPHAGE) 1000 MG tablet Take 1 tablet by mouth once daily with breakfast 90 tablet 0   fluticasone (FLONASE) 50 MCG/ACT nasal spray PLACE 2 SPRAYS INTO BOTH NOSTRILS DAILY. 16 g 1   No facility-administered medications prior to visit.    No Known Allergies     Objective:    BP (!) 142/82    Pulse 81    Ht 5\' 6"  (1.676 m)    Wt 234 lb 2 oz (106.2 kg)    LMP 03/26/2021 (Exact Date)    SpO2 99%    BMI 37.79 kg/m  Wt Readings from Last 3 Encounters:  04/13/21 234 lb 2 oz (106.2 kg)  04/07/21 235 lb 4.8 oz (106.7 kg)  02/02/21 246 lb 4.8 oz (111.7 kg)    Physical Exam Vitals and nursing note reviewed.  Constitutional:      Appearance: She is well-developed.  HENT:     Head: Normocephalic and atraumatic.  Cardiovascular:     Rate and Rhythm: Normal rate and regular rhythm.     Heart sounds: Normal heart sounds. No murmur heard.   No friction rub. No gallop.  Pulmonary:     Effort: Pulmonary effort is normal. No tachypnea or respiratory distress.     Breath sounds: Normal breath sounds. No  decreased breath sounds, wheezing, rhonchi or rales.  Chest:     Chest wall: No tenderness.  Abdominal:     General: Bowel sounds are normal.     Palpations: Abdomen is soft.  Musculoskeletal:        General: Normal range of motion.     Cervical back: Normal range of motion.  Skin:    General: Skin is warm and dry.     Comments: Moderate severity of psoriasis on bilateral elbows  Neurological:     Mental  Status: She is alert and oriented to person, place, and time.     Coordination: Coordination normal.  Psychiatric:        Behavior: Behavior normal. Behavior is cooperative.        Thought Content: Thought content normal.        Judgment: Judgment normal.         Patient has been counseled extensively about nutrition and exercise as well as the importance of adherence with medications and regular follow-up. The patient was given clear instructions to go to ER or return to medical center if symptoms don't improve, worsen or new problems develop. The patient verbalized understanding.   Follow-up: Return in about 3 months (around 07/11/2021).   Gildardo Pounds, FNP-BC [facility] and [place], [place]   04/13/2021, 3:35 PM

## 2021-04-14 ENCOUNTER — Other Ambulatory Visit: Payer: Self-pay

## 2021-04-14 LAB — THYROID PANEL WITH TSH
Free Thyroxine Index: 2.6 (ref 1.2–4.9)
T3 Uptake Ratio: 29 % (ref 24–39)
T4, Total: 8.9 ug/dL (ref 4.5–12.0)
TSH: 1.3 u[IU]/mL (ref 0.450–4.500)

## 2021-04-14 LAB — HIV ANTIBODY (ROUTINE TESTING W REFLEX): HIV Screen 4th Generation wRfx: NONREACTIVE

## 2021-04-14 LAB — RPR: RPR Ser Ql: NONREACTIVE

## 2021-04-15 ENCOUNTER — Other Ambulatory Visit: Payer: Self-pay

## 2021-04-15 ENCOUNTER — Encounter: Payer: Self-pay | Admitting: Nurse Practitioner

## 2021-04-15 LAB — CERVICOVAGINAL ANCILLARY ONLY
Bacterial Vaginitis (gardnerella): POSITIVE — AB
Candida Glabrata: POSITIVE — AB
Candida Vaginitis: NEGATIVE
Chlamydia: NEGATIVE
Comment: NEGATIVE
Comment: NEGATIVE
Comment: NEGATIVE
Comment: NEGATIVE
Comment: NEGATIVE
Comment: NORMAL
Neisseria Gonorrhea: NEGATIVE
Trichomonas: NEGATIVE

## 2021-04-16 ENCOUNTER — Other Ambulatory Visit: Payer: Self-pay

## 2021-04-17 ENCOUNTER — Telehealth: Payer: Self-pay | Admitting: Nurse Practitioner

## 2021-04-17 ENCOUNTER — Ambulatory Visit: Payer: Self-pay

## 2021-04-17 ENCOUNTER — Other Ambulatory Visit: Payer: Self-pay | Admitting: Nurse Practitioner

## 2021-04-17 DIAGNOSIS — N760 Acute vaginitis: Secondary | ICD-10-CM

## 2021-04-17 DIAGNOSIS — B9689 Other specified bacterial agents as the cause of diseases classified elsewhere: Secondary | ICD-10-CM

## 2021-04-17 DIAGNOSIS — B3731 Acute candidiasis of vulva and vagina: Secondary | ICD-10-CM

## 2021-04-17 MED ORDER — FLUCONAZOLE 150 MG PO TABS: 150.0000 mg | ORAL_TABLET | Freq: Once | ORAL | 1 refills | Status: AC

## 2021-04-17 MED ORDER — METRONIDAZOLE 500 MG PO TABS
500.0000 mg | ORAL_TABLET | Freq: Two times a day (BID) | ORAL | 0 refills | Status: AC
Start: 2021-04-17 — End: 2021-04-24

## 2021-04-17 NOTE — Telephone Encounter (Signed)
Pt called, she had called in wanting to know about lab results and which lab indicated she had a yeast infection. Advised pt where to look for provider notes under labs. Pt was able to see this and understood. No further questions noted.   Summary: Yeast infection Advise   Pt is calling to ask does she have a yeast infection? Pt saw that fluconazole (DIFLUCAN) 150 MG tablet [[id number]] was sent in. Or wanting to know is that to take after the antibotic. Please advise.  Pt is upset bc she sent her provider a mychart message 48 hours ago with no response.

## 2021-04-17 NOTE — Telephone Encounter (Signed)
Will forward message to PCP. I attempted to call the patient but got her VM. Left HIPAA-compliant VM with instructions to return our call. It looks like her and her PCP were corresponding about this via [place].

## 2021-04-17 NOTE — Telephone Encounter (Signed)
Pt is calling to ask questions regarding her BV - Pt saw that script for fagyl was sent in. And is wanting to know was fluconazole (DIFLUCAN) 150 MG tablet [[id number]] was sent in bc she has a yeast infection. Or is it to take following the fagyl.   Please advise CB- [phone]

## 2021-04-21 ENCOUNTER — Other Ambulatory Visit: Payer: Self-pay | Admitting: Nurse Practitioner

## 2021-04-21 DIAGNOSIS — I10 Essential (primary) hypertension: Secondary | ICD-10-CM

## 2021-04-21 NOTE — Telephone Encounter (Signed)
Requested Prescriptions  Pending Prescriptions Disp Refills   benazepril (LOTENSIN) 20 MG tablet [Pharmacy Med Name: Benazepril HCl 20 MG Oral Tablet] 90 tablet 1    Sig: Take 1 tablet by mouth once daily     Cardiovascular:  ACE Inhibitors Failed - 04/21/2021 11:27 AM      Failed - Last BP in normal range    BP Readings from Last 1 Encounters:  04/13/21 (!) 142/82         Passed - Cr in normal range and within 180 days    Creatinine  Date Value Ref Range Status  02/02/2021 0.73 0.44 - 1.00 mg/dL Final         Passed - K in normal range and within 180 days    Potassium  Date Value Ref Range Status  02/02/2021 4.3 3.5 - 5.1 mmol/L Final         Passed - Patient is not pregnant      Passed - Valid encounter within last 6 months    Recent Outpatient Visits          1 week ago Type 2 diabetes mellitus without complication, with long-term current use of insulin ([place])   [place], Vernia Buff, NP   3 months ago Leukocytosis, unspecified type   [place], Vernia Buff, NP   3 months ago Primary hypertension   [place], Vernia Buff, NP   4 months ago Upper respiratory infection, viral   [place], Vernia Buff, NP   6 months ago Encounter for Papanicolaou smear for cervical cancer screening   [place], Vernia Buff, NP      Future Appointments            In 2 months Gildardo Pounds, NP [place]

## 2021-05-25 ENCOUNTER — Other Ambulatory Visit: Payer: Self-pay

## 2021-05-28 ENCOUNTER — Other Ambulatory Visit: Payer: Self-pay

## 2021-05-28 MED ORDER — TREMFYA 100 MG/ML ~~LOC~~ SOSY
PREFILLED_SYRINGE | SUBCUTANEOUS | 11 refills | Status: DC
  Filled 2021-05-28: qty 1, 28d supply, fill #0
  Filled 2021-06-05: qty 1, 56d supply, fill #0
  Filled 2021-07-27: qty 1, 56d supply, fill #1
  Filled 2021-09-18: qty 1, 56d supply, fill #2
  Filled 2021-11-04: qty 1, 56d supply, fill #3
  Filled 2021-12-28: qty 1, 56d supply, fill #4
  Filled 2022-02-05 – 2022-02-08 (×2): qty 1, 56d supply, fill #5

## 2021-06-03 ENCOUNTER — Encounter: Payer: Self-pay | Admitting: Nurse Practitioner

## 2021-06-03 ENCOUNTER — Telehealth: Payer: Self-pay | Admitting: Nurse Practitioner

## 2021-06-03 ENCOUNTER — Other Ambulatory Visit: Payer: Self-pay | Admitting: Nurse Practitioner

## 2021-06-03 DIAGNOSIS — Z7251 High risk heterosexual behavior: Secondary | ICD-10-CM

## 2021-06-03 NOTE — Telephone Encounter (Signed)
Same goes for syphilis. Not until MAY. Thanks! ?

## 2021-06-03 NOTE — Telephone Encounter (Signed)
HIV test is only every 3 months. We  can not test for this until May. CDC recommends testing for this no more than 4 times per year.  ?If she wants to be tested sooner she can go to the health department.  ?She needs to make a lab appointment. She can not walkin for the other tests. I will go ahead and place order. If she is rude with you please let me know as this will not be tolerated.  ?

## 2021-06-03 NOTE — Telephone Encounter (Signed)
Copied from [place] [phone]. Topic: General - Other >> Jun 02, 2021  3:45 PM Yvette Rack wrote: Reason for CRM: Pt requests that an order be placed for std testing along with blood work for HIV and syphilis. Pt declined to schedule an appt and asked that her doctor place an order for testing and blood work.

## 2021-06-05 ENCOUNTER — Other Ambulatory Visit: Payer: Self-pay

## 2021-06-08 ENCOUNTER — Other Ambulatory Visit: Payer: Self-pay

## 2021-06-12 ENCOUNTER — Telehealth (HOSPITAL_COMMUNITY): Payer: Self-pay

## 2021-06-12 ENCOUNTER — Encounter (HOSPITAL_COMMUNITY): Payer: Self-pay | Admitting: Physician Assistant

## 2021-06-12 ENCOUNTER — Telehealth (INDEPENDENT_AMBULATORY_CARE_PROVIDER_SITE_OTHER): Payer: No Payment, Other | Admitting: Physician Assistant

## 2021-06-12 DIAGNOSIS — F99 Mental disorder, not otherwise specified: Secondary | ICD-10-CM

## 2021-06-12 DIAGNOSIS — F319 Bipolar disorder, unspecified: Secondary | ICD-10-CM | POA: Diagnosis not present

## 2021-06-12 DIAGNOSIS — F411 Generalized anxiety disorder: Secondary | ICD-10-CM | POA: Diagnosis not present

## 2021-06-12 DIAGNOSIS — F5105 Insomnia due to other mental disorder: Secondary | ICD-10-CM | POA: Diagnosis not present

## 2021-06-12 NOTE — Telephone Encounter (Signed)
Patient contacted office regarding residency. Patient states that she was homeless for the last two years, living in hotels. She is now living in [facility] with her boyfriend and his mother as of a week ago. She does not have proof of residency, aside from her boyfriends mother providing a letter stating she is living. Writer informed patient this will be given to managers and directors to follow-up on.  ?

## 2021-06-12 NOTE — Progress Notes (Signed)
BH MD/PA/NP OP Progress Note ? ?Virtual Visit via Telephone Note ? ?I connected with Valerie Reilly on 06/12/21 at 11:00 AM EDT by telephone and verified that I am speaking with the correct person using two identifiers. ? ?Location: ?Patient: Home ?Provider: Clinic ?  ?I discussed the limitations, risks, security and privacy concerns of performing an evaluation and management service by telephone and the availability of in person appointments. I also discussed with the patient that there may be a patient responsible charge related to this service. The patient expressed understanding and agreed to proceed. ? ?Follow Up Instructions: ?  ?I discussed the assessment and treatment plan with the patient. The patient was provided an opportunity to ask questions and all were answered. The patient agreed with the plan and demonstrated an understanding of the instructions. ?  ?The patient was advised to call back or seek an in-person evaluation if the symptoms worsen or if the condition fails to improve as anticipated. ? ?I provided 24 minutes of non-face-to-face time during this encounter. ? ?[name], PA  ? ? ?06/12/2021 11:09 AM ?Valerie Reilly  ?MRN:  [id number] ? ?Chief Complaint:  ?Chief Complaint  ?Patient presents with  ? Medication Refill  ? Follow-up  ? ?HPI:   ? ?Valerie Reilly is a 46 year old female with a past psychiatric history significant for generalized anxiety disorder, bipolar 1 disorder, and insomnia who presents to [facility] via virtual telephone visit for follow-up and medication management.  Patient is currently being managed on the following medications: ? ?Buspirone 2 times daily ?Lithium 300 mg 2 times daily ?Trazodone 150 mg 2-1/2 tablets at bedtime ? ?Patient reports no issues or concerns regarding her current medication regimen.  Patient endorses depression experiences depressive episodes 3-4 times a week.  Patient depressive episodes are  characterized by the following depressive symptoms: irritability, low mood, and decreased energy.  Patient denies lack of motivation and feelings of guilt/worthlessness.  She reports that her depressive symptoms are due to her current living situation.  Patient endorses anxiety and rates her anxiety as 7 out of 10.  Patient's current stressors include relationship troubles financial instability.  A GAD-7 screen was performed with the patient scoring a 17.  A PHQ-9 screen was performed with the patient scoring a 6. ? ?Patient is alert and oriented x4, calm, cooperative, and fully engaged in conversation during the encounter.  Patient endorses agitation.  Patient denies suicidal or homicidal ideations.  She further denies auditory or visual hallucinations and does not appear to be responding to internal/external stimuli.  Patient endorses good sleep and receives on average 8 hours of sleep each night.  Patient endorses good appetite and eats on average 2-3 meals per day.  Patient endorses alcohol consumption occasionally.  Patient endorses tobacco use and smokes on average 2 packs of cigarettes per day.  Patient denies illicit drug use. ? ?Visit Diagnosis:  ?  ICD-10-CM   ?1. GAD (generalized anxiety disorder)  F41.1 busPIRone (BUSPAR) 30 MG tablet  ?  ?2. Bipolar I disorder (HCC)  F31.9 lithium carbonate 300 MG capsule  ?  traZODone (DESYREL) 150 MG tablet  ?  ?3. Insomnia due to other mental disorder  F51.05 traZODone (DESYREL) 150 MG tablet  ? F99   ?  ? ? ?Past Psychiatric History:  ?Bipolar disorder ?Generalized anxiety disorder ?Insomnia ? ?Past Medical History:  ?Past Medical History:  ?Diagnosis Date  ? Asthma   ? Bipolar disorder ([place])   ?  Diabetes mellitus   ? Diabetes mellitus, type II ([place])   ? GERD (gastroesophageal reflux disease)   ? History of borderline personality disorder   ? Hypercholesteremia   ? Hypertension   ? Psoriasis   ? Psoriasis   ? Seasonal allergies   ? Thyroid disease   ?  ?Past  Surgical History:  ?Procedure Laterality Date  ? TONSILLECTOMY    ? TUBAL LIGATION    ? ? ?Family Psychiatric History:  ?Mother - Personality disorder ?Uncle (maternal) - Alcohol abuse ?Uncle (maternal) - Depression ? ?Family History:  ?Family History  ?Problem Relation Age of Onset  ? Personality disorder Mother   ? Alcohol abuse Maternal Uncle   ? Depression Maternal Uncle   ? Breast cancer Neg Hx   ? ? ?Social History:  ?Social History  ? ?Socioeconomic History  ? Marital status: Divorced  ?  Spouse name: Not on file  ? Number of children: 1  ? Years of education: Not on file  ? Highest education level: Associate degree: occupational, Hotel manager, or vocational program  ?Occupational History  ? Not on file  ?Tobacco Use  ? Smoking status: Every Day  ?  Packs/day: 1.00  ?  Types: Cigarettes, E-cigarettes  ? Smokeless tobacco: Never  ?Vaping Use  ? Vaping Use: Former  ?Substance and Sexual Activity  ? Alcohol use: Yes  ?  Alcohol/week: 0.0 standard drinks  ?  Comment: occasional- a few times a year. 6-7 drinks per episdoe  ? Drug use: No  ?  Types: Marijuana  ?  Comment: last time was 3 yrs ago  ? Sexual activity: Yes  ?  Birth control/protection: Surgical  ?Other Topics Concern  ? Not on file  ?Social History Narrative  ? Not on file  ? ?Social Determinants of Health  ? ?Financial Resource Strain: Not on file  ?Food Insecurity: No Food Insecurity  ? Worried About Charity fundraiser in the Last Year: Never true  ? Ran Out of Food in the Last Year: Never true  ?Transportation Needs: No Transportation Needs  ? Lack of Transportation (Medical): No  ? Lack of Transportation (Non-Medical): No  ?Physical Activity: Not on file  ?Stress: Not on file  ?Social Connections: Not on file  ? ? ?Allergies: No Known Allergies ? ?Metabolic Disorder Labs: ?Lab Results  ?Component Value Date  ? HGBA1C 8.5 (A) 04/13/2021  ? ?No results found for: PROLACTIN ?Lab Results  ?Component Value Date  ? CHOL 184 01/09/2021  ? TRIG 183 (H)  01/09/2021  ? HDL 37 (L) 01/09/2021  ? CHOLHDL 5.0 (H) 01/09/2021  ? LDLCALC 115 (H) 01/09/2021  ? [place] 91 06/09/2020  ? ?Lab Results  ?Component Value Date  ? TSH 1.300 04/13/2021  ? TSH 1.350 04/08/2020  ? ? ?Therapeutic Level Labs: ?Lab Results  ?Component Value Date  ? LITHIUM 0.8 10/13/2020  ? LITHIUM 0.8 12/04/2019  ? ?No results found for: VALPROATE ?No components found for:  CBMZ ? ?Current Medications: ?Current Outpatient Medications  ?Medication Sig Dispense Refill  ? benazepril (LOTENSIN) 20 MG tablet Take 1 tablet by mouth once daily 90 tablet 1  ? busPIRone (BUSPAR) 30 MG tablet TAKE 1 TABLET (30 MG TOTAL) BY MOUTH 2 (TWO) TIMES DAILY. 180 tablet 0  ? cholecalciferol (VITAMIN D) 1000 UNITS tablet Take 1,000 Units by mouth daily.    ? Famotidine (PEPCID PO) Take by mouth.    ? fexofenadine (ALLEGRA) 180 MG tablet Take 180 mg by mouth daily.    ?  fluticasone (FLONASE) 50 MCG/ACT nasal spray PLACE 2 SPRAYS INTO BOTH NOSTRILS DAILY. 16 g 1  ? glipiZIDE (GLUCOTROL XL) 10 MG 24 hr tablet Take 1 tablet by mouth once daily with breakfast 90 tablet 0  ? Guselkumab (TREMFYA) 100 MG/ML SOSY inject '100mg'$ /62m into the skin a week 0 and 4 and them 8 weeks thereafter 1 mL 11  ? hydroxypropyl methylcellulose / hypromellose (ISOPTO TEARS / GONIOVISC) 2.5 % ophthalmic solution Place 1 drop into both eyes in the morning and at bedtime.    ? levothyroxine (SYNTHROID) 50 MCG tablet TAKE 1 TABLET BY MOUTH ONCE DAILY BEFORE BREAKFAST 30 tablet 2  ? lithium carbonate 300 MG capsule Take 2 capsules by mouth 2 (two) times daily. 360 capsule 0  ? metFORMIN (GLUCOPHAGE) 1000 MG tablet Take 1 tablet (1,000 mg total) by mouth 2 (two) times daily with a meal. 180 tablet 1  ? Multiple Vitamin (MULTIVITAMIN) capsule Take 1 capsule by mouth daily.    ? simvastatin (ZOCOR) 40 MG tablet Take 1 tablet by mouth once daily 90 tablet 1  ? traZODone (DESYREL) 150 MG tablet TAKE 2 & 1/2 TABLETS BY MOUTH AT BEDTIME 225 tablet 0  ? ?No current  facility-administered medications for this visit.  ? ? ? ?Musculoskeletal: ?Strength & Muscle Tone: Unable to assess due to telemedicine visit  ?Gait & Station: Unable to assess due to telemedicine visit  ?Patient

## 2021-06-15 ENCOUNTER — Telehealth (HOSPITAL_COMMUNITY): Payer: Self-pay | Admitting: *Deleted

## 2021-06-15 MED ORDER — BUSPIRONE HCL 30 MG PO TABS: ORAL_TABLET | Freq: Two times a day (BID) | ORAL | 0 refills | Status: DC

## 2021-06-15 MED ORDER — LITHIUM CARBONATE 300 MG PO CAPS: ORAL_CAPSULE | Freq: Two times a day (BID) | ORAL | 0 refills | Status: DC

## 2021-06-15 MED ORDER — TRAZODONE HCL 150 MG PO TABS: ORAL_TABLET | ORAL | 0 refills | Status: DC

## 2021-06-15 NOTE — Telephone Encounter (Signed)
Fax request from her pharmacy on [address]. I spoke with pharmacist to clarify his fax, her Lithium was called in but since they got the rx they have changed manufacturers and it requires the provider that wrote the rx to ok the change. Eddie PA is not here on mondays so will note concern and forward it to him to consider tomorrow when he returns to the office. ?

## 2021-06-16 ENCOUNTER — Telehealth (HOSPITAL_COMMUNITY): Payer: Self-pay | Admitting: *Deleted

## 2021-06-16 NOTE — Telephone Encounter (Signed)
Consulted with Eddie PA re pharmacy request for him to be notified and approve of the Lithium being filled using a different manufacturer. Eddie PA gave his approval and I called the pharmacy to notify them to go forward filling the rx.  ?

## 2021-06-17 NOTE — Telephone Encounter (Signed)
Message acknowledged and received.

## 2021-06-22 ENCOUNTER — Other Ambulatory Visit: Payer: Self-pay | Admitting: Nurse Practitioner

## 2021-06-22 DIAGNOSIS — Z794 Long term (current) use of insulin: Secondary | ICD-10-CM

## 2021-06-23 ENCOUNTER — Other Ambulatory Visit: Payer: Self-pay

## 2021-06-23 MED ORDER — TRAZODONE HCL 150 MG PO TABS
375.0000 mg | ORAL_TABLET | Freq: Every day | ORAL | 0 refills | Status: DC
  Filled 2021-07-06: qty 75, 30d supply, fill #0
  Filled 2021-08-17: qty 75, 30d supply, fill #1

## 2021-06-23 MED ORDER — BUSPIRONE HCL 30 MG PO TABS
30.0000 mg | ORAL_TABLET | Freq: Two times a day (BID) | ORAL | 0 refills | Status: DC
  Filled 2021-06-23: qty 60, 30d supply, fill #0
  Filled 2021-07-27: qty 60, 30d supply, fill #1

## 2021-06-23 MED ORDER — LITHIUM CARBONATE 300 MG PO CAPS
600.0000 mg | ORAL_CAPSULE | Freq: Two times a day (BID) | ORAL | 0 refills | Status: DC
  Filled 2021-07-27: qty 120, 30d supply, fill #0

## 2021-06-25 ENCOUNTER — Other Ambulatory Visit: Payer: Self-pay

## 2021-07-06 ENCOUNTER — Other Ambulatory Visit: Payer: Self-pay

## 2021-07-06 ENCOUNTER — Other Ambulatory Visit: Payer: Self-pay | Admitting: Family Medicine

## 2021-07-06 DIAGNOSIS — E039 Hypothyroidism, unspecified: Secondary | ICD-10-CM

## 2021-07-13 ENCOUNTER — Ambulatory Visit: Payer: No Typology Code available for payment source | Admitting: Nurse Practitioner

## 2021-07-27 ENCOUNTER — Other Ambulatory Visit: Payer: Self-pay

## 2021-07-28 ENCOUNTER — Other Ambulatory Visit: Payer: Self-pay

## 2021-07-29 ENCOUNTER — Other Ambulatory Visit: Payer: Self-pay

## 2021-08-05 ENCOUNTER — Ambulatory Visit
Admission: RE | Admit: 2021-08-05 | Discharge: 2021-08-05 | Disposition: A | Discharge status: Home / Self Care | Payer: Self-pay | Source: Ambulatory Visit | Attending: Physician Assistant | Admitting: Physician Assistant

## 2021-08-05 ENCOUNTER — Other Ambulatory Visit: Payer: Self-pay

## 2021-08-05 ENCOUNTER — Other Ambulatory Visit (HOSPITAL_COMMUNITY)
Admission: RE | Admit: 2021-08-05 | Discharge: 2021-08-05 | Disposition: A | Discharge status: Home / Self Care | Payer: Self-pay | Source: Ambulatory Visit | Attending: Nurse Practitioner | Admitting: Nurse Practitioner

## 2021-08-05 ENCOUNTER — Encounter: Payer: Self-pay | Admitting: Physician Assistant

## 2021-08-05 ENCOUNTER — Ambulatory Visit: Payer: Self-pay | Attending: Nurse Practitioner | Admitting: Physician Assistant

## 2021-08-05 VITALS — BP 138/89 | HR 71 | Wt 234.4 lb

## 2021-08-05 DIAGNOSIS — E119 Type 2 diabetes mellitus without complications: Secondary | ICD-10-CM

## 2021-08-05 DIAGNOSIS — N760 Acute vaginitis: Secondary | ICD-10-CM

## 2021-08-05 DIAGNOSIS — N898 Other specified noninflammatory disorders of vagina: Secondary | ICD-10-CM | POA: Insufficient documentation

## 2021-08-05 DIAGNOSIS — R10A0 Flank pain, unspecified side: Secondary | ICD-10-CM

## 2021-08-05 DIAGNOSIS — Z794 Long term (current) use of insulin: Secondary | ICD-10-CM

## 2021-08-05 DIAGNOSIS — M25561 Pain in right knee: Secondary | ICD-10-CM

## 2021-08-05 DIAGNOSIS — G5603 Carpal tunnel syndrome, bilateral upper limbs: Secondary | ICD-10-CM

## 2021-08-05 DIAGNOSIS — G8929 Other chronic pain: Secondary | ICD-10-CM

## 2021-08-05 DIAGNOSIS — R109 Unspecified abdominal pain: Secondary | ICD-10-CM

## 2021-08-05 LAB — POCT GLYCOSYLATED HEMOGLOBIN (HGB A1C): HbA1c, POC (controlled diabetic range): 6.3 % (ref 0.0–7.0)

## 2021-08-05 LAB — GLUCOSE, POCT (MANUAL RESULT ENTRY): POC Glucose: 134 mg/dl — AB (ref 70–99)

## 2021-08-05 MED ORDER — MELOXICAM 15 MG PO TABS: 15.0000 mg | ORAL_TABLET | Freq: Every day | ORAL | 2 refills | Status: DC

## 2021-08-05 NOTE — Progress Notes (Signed)
Patient ID: Valerie Reilly, female   DOB: 07-09-1975, 46 y.o.   MRN: [id number]   Valerie Reilly, is a 46 y.o. female  HEN:[id number]  TIR:[id number]  DOB - Aug 30, 1975  Chief Complaint  Patient presents with   Diabetes       Subjective:   Valerie Reilly is a 46 y.o. female here today for a follow up visit for diabetes and several issues.    Patient c/o recurrent BV.  Currently has one partner and no concern for STI.  No pelvic pain or fever.  Perimenopausal.  She's had 2 periods in the last year.    Pain in R flank.  Pain is intermittent.  Sometimes fleeting and sharp, but sometimes aches for hours.  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  No radiating pain.  No urinary s/sx.  R knee pain on and off for a while now.  NKI.  Sometimes feels as though knee will give way.  Aches.  Advil helps some.  B hand pain and paresthesias of index and middle fingers esp.  At night or when holding phone.    No problems updated.  ALLERGIES: No Known Allergies  PAST MEDICAL HISTORY: Past Medical History:  Diagnosis Date   Asthma    Bipolar disorder ([place])    Diabetes mellitus    Diabetes mellitus, type II ([place])    GERD (gastroesophageal reflux disease)    History of borderline personality disorder    Hypercholesteremia    Hypertension    Psoriasis    Psoriasis    Seasonal allergies    Thyroid disease     MEDICATIONS AT HOME: Prior to Admission medications   Medication Sig Start Date End Date Taking? Authorizing Provider  benazepril (LOTENSIN) 20 MG tablet Take 1 tablet by mouth once daily 04/21/21  Yes Gildardo Pounds, NP  busPIRone (BUSPAR) 30 MG tablet TAKE 1 TABLET (30 MG TOTAL) BY MOUTH 2 (TWO) TIMES DAILY. 06/15/21 06/15/22 Yes Nwoko, Terese Door, PA  busPIRone (BUSPAR) 30 MG tablet Take 1 tablet (30 mg total) by mouth 2 (two) times daily. 06/23/21  Yes   cholecalciferol (VITAMIN D) 1000 UNITS tablet Take 1,000 Units by mouth daily.    Yes [provider]  Famotidine (PEPCID PO) Take by mouth.   Yes [provider]  fexofenadine (ALLEGRA) 180 MG tablet Take 180 mg by mouth daily.   Yes [provider]  glipiZIDE (GLUCOTROL XL) 10 MG 24 hr tablet Take 1 tablet by mouth once daily with breakfast 06/22/21  Yes Newlin, Charlane Ferretti, MD  Guselkumab (TREMFYA) 100 MG/ML SOSY inject '100mg'$ /81m into the skin a week 0 and 4 and them 8 weeks thereafter 04/13/21  Yes Newlin, ECharlane Ferretti MD  hydroxypropyl methylcellulose / hypromellose (ISOPTO TEARS / GONIOVISC) 2.5 % ophthalmic solution Place 1 drop into both eyes in the morning and at bedtime.   Yes [provider]  levothyroxine (SYNTHROID) 50 MCG tablet TAKE 1 TABLET BY MOUTH ONCE DAILY BEFORE BREAKFAST 07/06/21  Yes NCharlott Rakes MD  lithium carbonate 300 MG capsule Take 2 capsules by mouth 2 (two) times daily. 06/15/21 06/15/22 Yes Nwoko, UIsidoro DonningE, PA  lithium carbonate 300 MG capsule Take 2 capsules (600 mg total) by mouth 2 (two) times daily. 06/23/21  Yes   meloxicam (MOBIC) 15 MG tablet Take 1 tablet (15 mg total) by mouth daily. Prn pain 08/05/21  Yes MArgentina Donovan PA-C  Multiple Vitamin (MULTIVITAMIN) capsule Take 1  capsule by mouth daily.   Yes [provider]  simvastatin (ZOCOR) 40 MG tablet Take 1 tablet by mouth once daily 03/09/21  Yes Newlin, Enobong, MD  traZODone (DESYREL) 150 MG tablet TAKE 2 & 1/2 TABLETS BY MOUTH AT BEDTIME 06/15/21 06/15/22 Yes Nwoko, Uchenna E, PA  traZODone (DESYREL) 150 MG tablet Take 2.5 tablets (375 mg total) by mouth at bedtime. 06/23/21  Yes   fluticasone (FLONASE) 50 MCG/ACT nasal spray PLACE 2 SPRAYS INTO BOTH NOSTRILS DAILY. 04/04/20 04/04/21  Gildardo Pounds, NP  metFORMIN (GLUCOPHAGE) 1000 MG tablet Take 1 tablet (1,000 mg total) by mouth 2 (two) times daily with a meal. 04/13/21 07/12/21  Gildardo Pounds, NP    ROS: Neg HEENT Neg resp Neg cardiac Neg GI Neg psych Neg neuro  Objective:   Vitals:    08/05/21 0946  BP: 138/89  Pulse: 71  SpO2: 97%  Weight: 234 lb 6.4 oz (106.3 kg)   Exam General appearance : Awake, Reilly, not in any distress. Speech Clear. Not toxic looking HEENT: Atraumatic and Normocephalic Neck: Supple, no JVD. No cervical lymphadenopathy.  Chest: Good air entry bilaterally, CTAB.  No rales/rhonchi/wheezing CVS: S1 S2 regular, no murmurs.  Back:  no spiny ttp.  Mild spasm in R flank area.  No gross abnormality.  Neg SLR B.  DTR=intact  B.  R knee with full ROM.  Ligaments stable.  No ballotment or effusion Extremities: B/L Lower Ext shows no edema, both legs are warm to touch Neurology: Awake Reilly, and oriented X 3, CN II-XII intact, Non focal Skin: No Rash  Data Review Lab Results  Component Value Date   HGBA1C 6.3 08/05/2021   HGBA1C 8.5 (A) 04/13/2021   HGBA1C 7.0 (A) 01/09/2021    Assessment & Plan   1. Type 2 diabetes mellitus without complication, with long-term current use of insulin (HCC) Much improved-continue current regimen.  Patient will call when RF are needed - Glucose (CBG) - HgB A1c  2. Vaginal discharge For BV prevention(she is perimenopausal).  Astroglide lubricant(water-bASED for sex).  Replens vaginal moisturizer.  Take probitics daily - Cervicovaginal ancillary only  3. Chronic pain of right knee Meloxicam prn - DG Knee Complete 4 Views Right; Future  4. Flank pain, chronic - DG Abd 1 View; Future  5. Bilateral carpal tunnel syndrome B carpal tunnel splints for sleep nightly  6. Recurrent vaginitis For BV prevention(she is perimenopausal).  Astroglide lubricant(water-bASED for sex).  Replens vaginal moisturizer.  Take probitics daily   SHE WILL CALL FOR RF.  Please RF meds for 4 months!!! See PCP in 4 months.  Ok to RF meds until then  The patient was given clear instructions to go to ER or return to medical center if symptoms don't improve, worsen or new problems develop. The patient verbalized understanding. The  patient was told to call to get lab results if they haven't heard anything in the next week.      Freeman Caldron, PA-C [facility] [place], [place]   08/05/2021, 10:39 AM

## 2021-08-05 NOTE — Patient Instructions (Addendum)
Carpal tunnel splints-sleep nightly  Probiotics daily by mouth Replens vaginal-use in the vagina twice weekly 80-100 ounces water daily  Vaginitis  Vaginitis is irritation and swelling of the vagina. Treatment will depend on the cause. What are the causes? It can be caused by: Bacteria. Yeast. A parasite. A virus. Low hormone levels. Bubble baths, scented tampons, and feminine sprays. Other things can change the balance of the yeast and bacteria that live in the vagina. These include: Antibiotic medicines. Not being clean enough. Some birth control methods. Sex. Infection. Diabetes. A weakened body defense system (immune system). What increases the risk? Smoking or being around someone who smokes. Using washes (douches), scented tampons, or scented pads. Wearing tight pants or thong underwear. Using birth control pills or an IUD. Having sex without a condom or having a lot of partners. Having an STI. Using a certain product to kill sperm (nonoxynol-9). Eating foods that are high in sugar. Having diabetes. Having low levels of a female hormone. Having a weakened body defense system. Being pregnant or breastfeeding. What are the signs or symptoms? Fluid coming from the vagina that is not normal. A bad smell. Itching, pain, or swelling. Pain with sex. Pain or burning when you pee (urinate). Sometimes there are no symptoms. How is this treated? Treatment may include: Antibiotic creams or pills. Antifungal medicines. Medicines to ease symptoms if you have a virus. Your sex partner should also be treated. Estrogen medicines. Avoiding scented soaps, sprays, or douches. Stopping use of products that caused irritation and then using a cream to treat symptoms. Follow these instructions at home: Lifestyle Keep the area around your vagina clean and dry. Avoid using soap. Rinse the area with water. Until your doctor says it is okay: Do not use washes for the vagina. Do  not use tampons. Do not have sex. Wipe from front to back after going to the bathroom. When your doctor says it is okay, practice safe sex and use condoms. General instructions Take over-the-counter and prescription medicines only as told by your doctor. If you were prescribed an antibiotic medicine, take or use it as told by your doctor. Do not stop taking or using it even if you start to feel better. Keep all follow-up visits. How is this prevented? Do not use things that can irritate the vagina, such as fabric softeners. Avoid these products if they are scented: Sprays. Detergents. Tampons. Products for cleaning the vagina. Soaps or bubble baths. Let air reach your vagina. To do this: Wear cotton underwear. Do not wear: Underwear while you sleep. Tight pants. Thong underwear. Underwear or nylons without a cotton panel. Take off any wet clothing, such as bathing suits, as soon as you can. Practice safe sex and use condoms. Contact a doctor if: You have pain in your belly or in the area between your hips. You have a fever or chills. Your symptoms last for more than 2-3 days. Get help right away if: You have a fever and your symptoms get worse all of a sudden. Summary Vaginitis is irritation and swelling of the vagina. Treatment will depend on the cause of the condition. Do not use washes or tampons or have sex until your doctor says it is okay. This information is not intended to replace advice given to you by your health care provider. Make sure you discuss any questions you have with your health care provider. Document Revised: 08/23/2019 Document Reviewed: 08/23/2019 Elsevier Patient Education  [place] Syndrome  Carpal tunnel syndrome is a condition that causes pain, weakness, and numbness in your hand and arm. Numbness is when you cannot feel an area in your body. The carpal tunnel is a narrow area that is on the palm side of your wrist.  Repeated wrist motion or certain diseases may cause swelling in the tunnel. This swelling can pinch the main nerve in the wrist. This nerve is called the median nerve. What are the causes? This condition may be caused by: Moving your hand and wrist over and over again while doing a task. Injury to the wrist. Arthritis. A sac of fluid (cyst) or abnormal growth (tumor) in the carpal tunnel. Fluid buildup during pregnancy. Use of tools that vibrate. Sometimes the cause is not known. What increases the risk? The following factors may make you more likely to have this condition: Having a job that makes you do these things: Move your hand over and over again. Work with tools that vibrate, such as drills or sanders. Being a woman. Having diabetes, obesity, thyroid problems, or kidney failure. What are the signs or symptoms? Symptoms of this condition include: A tingling feeling in your fingers. Tingling or loss of feeling in your hand. Pain in your entire arm. This pain may get worse when you bend your wrist and elbow for a long time. Pain in your wrist that goes up your arm to your shoulder. Pain that goes down into your palm or fingers. Weakness in your hands. You may find it hard to grab and hold items. You may feel worse at night. How is this treated? This condition may be treated with: Lifestyle changes. You will be asked to stop or change the activity that caused your problem. Doing exercises and activities that make bones, muscles, and tendons stronger (physical therapy). Learning how to use your hand again (occupational therapy). Medicines for pain and swelling. You may have injections in your wrist. A wrist splint or brace. Surgery. Follow these instructions at home: If you have a splint or brace: Wear the splint or brace as told by your doctor. Take it off only as told by your doctor. Loosen the splint if your fingers: Tingle. Become numb. Turn cold and blue. Keep the  splint or brace clean. If the splint or brace is not waterproof: Do not let it get wet. Cover it with a watertight covering when you take a bath or a shower. Managing pain, stiffness, and swelling If told, put ice on the painful area: If you have a removable splint or brace, remove it as told by your doctor. Put ice in a plastic bag. Place a towel between your skin and the bag. Leave the ice on for 20 minutes, 2-3 times per day. Do not fall asleep with the cold pack on your skin. Take off the ice if your skin turns bright red. This is very important. If you cannot feel pain, heat, or cold, you have a greater risk of damage to the area. Move your fingers often to reduce stiffness and swelling. General instructions Take over-the-counter and prescription medicines only as told by your doctor. Rest your wrist from any activity that may cause pain. If needed, talk with your boss at work about changes that can help your wrist heal. Do exercises as told by your doctor, physical therapist, or occupational therapist. Keep all follow-up visits. Contact a doctor if: You have new symptoms. Medicine does not help your pain. Your symptoms get worse. Get help right away if: You  have very bad numbness or tingling in your wrist or hand. Summary Carpal tunnel syndrome is a condition that causes pain in your hand and arm. It is often caused by repeated wrist motions. Lifestyle changes and medicines are used to treat this problem. Surgery may help in very bad cases. Follow your doctor's instructions about wearing a splint, resting your wrist, keeping follow-up visits, and calling for help. This information is not intended to replace advice given to you by your health care provider. Make sure you discuss any questions you have with your health care provider. Document Revised: 07/05/2019 Document Reviewed: 07/05/2019 Elsevier Patient Education  [place].

## 2021-08-06 ENCOUNTER — Other Ambulatory Visit: Payer: Self-pay | Admitting: Physician Assistant

## 2021-08-06 DIAGNOSIS — G8929 Other chronic pain: Secondary | ICD-10-CM

## 2021-08-06 DIAGNOSIS — R9389 Abnormal findings on diagnostic imaging of other specified body structures: Secondary | ICD-10-CM

## 2021-08-07 LAB — CERVICOVAGINAL ANCILLARY ONLY
Bacterial Vaginitis (gardnerella): POSITIVE — AB
Candida Glabrata: NEGATIVE
Candida Vaginitis: NEGATIVE
Chlamydia: NEGATIVE
Comment: NEGATIVE
Comment: NEGATIVE
Comment: NEGATIVE
Comment: NEGATIVE
Comment: NEGATIVE
Comment: NORMAL
Neisseria Gonorrhea: NEGATIVE
Trichomonas: NEGATIVE

## 2021-08-09 ENCOUNTER — Other Ambulatory Visit: Payer: Self-pay | Admitting: Physician Assistant

## 2021-08-09 MED ORDER — METRONIDAZOLE 500 MG PO TABS: 500.0000 mg | ORAL_TABLET | Freq: Two times a day (BID) | ORAL | 0 refills | Status: DC

## 2021-08-11 ENCOUNTER — Ambulatory Visit
Admission: RE | Admit: 2021-08-11 | Discharge: 2021-08-11 | Disposition: A | Discharge status: Home / Self Care | Payer: No Typology Code available for payment source | Source: Ambulatory Visit | Attending: Physician Assistant | Admitting: Physician Assistant

## 2021-08-11 ENCOUNTER — Other Ambulatory Visit: Payer: Self-pay | Admitting: Nurse Practitioner

## 2021-08-11 DIAGNOSIS — R9389 Abnormal findings on diagnostic imaging of other specified body structures: Secondary | ICD-10-CM

## 2021-08-11 DIAGNOSIS — G8929 Other chronic pain: Secondary | ICD-10-CM

## 2021-08-11 DIAGNOSIS — E039 Hypothyroidism, unspecified: Secondary | ICD-10-CM

## 2021-08-11 MED ORDER — LEVOTHYROXINE SODIUM 50 MCG PO TABS: 50.0000 ug | ORAL_TABLET | Freq: Every day | ORAL | 0 refills | Status: DC

## 2021-08-11 NOTE — Telephone Encounter (Signed)
Copied from [place] [id number]. Topic: General - Other >> Aug 11, 2021  9:29 AM Tessa Lerner A wrote: Reason for CRM: Medication Refill - Medication: levothyroxine (SYNTHROID) 50 MCG tablet [[id number]] - patient has 0 tablets remaining   Has the patient contacted their pharmacy? Yes.  The patient's pharmacy has made contact on their behalf  (Agent: If no, request that the patient contact the pharmacy for the refill. If patient does not wish to contact the pharmacy document the reason why and proceed with request.) (Agent: If yes, when and what did the pharmacy advise?)  Preferred Pharmacy (with phone number or street name): [place], [place] [address] Phone: [phone] Fax: [phone] Hours: Not open 24 hours  Has the patient been seen for an appointment in the last year OR does the patient have an upcoming appointment? Yes.    Agent: Please be advised that RX refills may take up to 3 business days. We ask that you follow-up with your pharmacy.

## 2021-08-11 NOTE — Telephone Encounter (Signed)
Requested Prescriptions  Pending Prescriptions Disp Refills  . levothyroxine (SYNTHROID) 50 MCG tablet 90 tablet 0    Sig: Take 1 tablet (50 mcg total) by mouth daily before breakfast.     Endocrinology:  Hypothyroid Agents Passed - 08/11/2021 11:41 AM      Passed - TSH in normal range and within 360 days    TSH  Date Value Ref Range Status  04/13/2021 1.300 0.450 - 4.500 uIU/mL Final         Passed - Valid encounter within last 12 months    Recent Outpatient Visits          6 days ago Type 2 diabetes mellitus without complication, with long-term current use of insulin [facility])   [place], [place], [place]   4 months ago Type 2 diabetes mellitus without complication, with long-term current use of insulin [facility])   [place], Vernia Buff, NP   7 months ago Leukocytosis, unspecified type   [place], Vernia Buff, NP   7 months ago Primary hypertension   [place], Vernia Buff, NP   8 months ago Upper respiratory infection, viral   [place], Vernia Buff, NP      Future Appointments            In 3 months Gildardo Pounds, NP [place]

## 2021-08-12 ENCOUNTER — Other Ambulatory Visit: Payer: Self-pay | Admitting: Physician Assistant

## 2021-08-12 DIAGNOSIS — K8020 Calculus of gallbladder without cholecystitis without obstruction: Secondary | ICD-10-CM

## 2021-08-17 ENCOUNTER — Other Ambulatory Visit: Payer: Self-pay

## 2021-09-01 ENCOUNTER — Encounter: Payer: Self-pay | Admitting: Surgery

## 2021-09-01 ENCOUNTER — Ambulatory Visit (INDEPENDENT_AMBULATORY_CARE_PROVIDER_SITE_OTHER): Payer: Self-pay | Admitting: Surgery

## 2021-09-01 VITALS — BP 138/84 | HR 68 | Temp 98.3°F | Resp 14 | Ht 66.0 in | Wt 231.0 lb

## 2021-09-01 DIAGNOSIS — K8020 Calculus of gallbladder without cholecystitis without obstruction: Secondary | ICD-10-CM

## 2021-09-01 DIAGNOSIS — R109 Unspecified abdominal pain: Secondary | ICD-10-CM

## 2021-09-01 NOTE — Progress Notes (Signed)
[facility] History and Physical  Reason for Referral: Cholelithiasis Referring Physician: Dr. Bruna Potter Valerie is a 46 y.o. Reilly.  HPI: Patient presents for evaluation of cholelithiasis.  She states that she has been having right flank pain on and off for multiple years.  She states that the pain is not associated with anything in particular.  She has been taking meloxicam for the pain, which does take the edge off.  She has never been evaluated for kidney stones.  She has occasional nausea, but that does not to him to be associated with anything.  She denies any episodes of vomiting.  She has had occasional epigastric pain, but again this is nonspecific.  She denies any postprandial abdominal pain or nausea.  She denies fevers or chills.  Her surgical history significant for tubal ligation.  Her medical history significant for diabetes, hypertension, hyperlipidemia, and hypothyroidism.  She smokes 1 pack/day, and occasionally drinks alcohol.  She denies use of illicit drugs.  She denies use of blood thinning medications.  Past Medical History:  Diagnosis Date   Asthma    Bipolar disorder ([place])    Diabetes mellitus    Diabetes mellitus, type II ([place])    GERD (gastroesophageal reflux disease)    History of borderline personality disorder    Hypercholesteremia    Hypertension    Psoriasis    Psoriasis    Seasonal allergies    Thyroid disease     Past Surgical History:  Procedure Laterality Date   TONSILLECTOMY     TUBAL LIGATION      Family History  Problem Relation Age of Onset   Personality disorder Mother    Alcohol abuse Maternal Uncle    Depression Maternal Uncle    Breast cancer Neg Hx     Social History   Tobacco Use   Smoking status: Every Day    Packs/day: 1.00    Types: Cigarettes, E-cigarettes   Smokeless tobacco: Never  Vaping Use   Vaping Use: Former  Substance Use Topics   Alcohol use: Yes    Alcohol/week: 0.0 standard  drinks of alcohol    Comment: occasional- a few times a year. 6-7 drinks per episdoe   Drug use: No    Types: Marijuana    Comment: last time was 3 yrs ago    Medications: I have reviewed the patient's current medications. Allergies as of 09/01/2021   No Known Allergies      Medication List        Accurate as of September 01, 2021  8:53 AM. If you have any questions, ask your nurse or doctor.          benazepril 20 MG tablet Commonly known as: LOTENSIN Take 1 tablet by mouth once daily   busPIRone 30 MG tablet Commonly known as: BUSPAR TAKE 1 TABLET (30 MG TOTAL) BY MOUTH 2 (TWO) TIMES DAILY.   busPIRone 30 MG tablet Commonly known as: BUSPAR Take 1 tablet (30 mg total) by mouth 2 (two) times daily.   cholecalciferol 1000 units tablet Commonly known as: VITAMIN D Take 1,000 Units by mouth daily.   fexofenadine 180 MG tablet Commonly known as: ALLEGRA Take 180 mg by mouth daily.   fluticasone 50 MCG/ACT nasal spray Commonly known as: FLONASE PLACE 2 SPRAYS INTO BOTH NOSTRILS DAILY.   glipiZIDE 10 MG 24 hr tablet Commonly known as: GLUCOTROL XL Take 1 tablet by mouth once daily with breakfast   hydroxypropyl methylcellulose /  hypromellose 2.5 % ophthalmic solution Commonly known as: ISOPTO TEARS / GONIOVISC Place 1 drop into both eyes in the morning and at bedtime.   levothyroxine 50 MCG tablet Commonly known as: SYNTHROID Take 1 tablet (50 mcg total) by mouth daily before breakfast.   lithium carbonate 300 MG capsule Take 2 capsules by mouth 2 (two) times daily.   lithium carbonate 300 MG capsule Take 2 capsules (600 mg total) by mouth 2 (two) times daily.   meloxicam 15 MG tablet Commonly known as: MOBIC Take 1 tablet (15 mg total) by mouth daily. Prn pain   metFORMIN 1000 MG tablet Commonly known as: GLUCOPHAGE Take 1 tablet (1,000 mg total) by mouth 2 (two) times daily with a meal.   metroNIDAZOLE 500 MG tablet Commonly known as: FLAGYL Take 1  tablet (500 mg total) by mouth 2 (two) times daily.   multivitamin capsule Take 1 capsule by mouth daily.   PEPCID PO Take by mouth.   simvastatin 40 MG tablet Commonly known as: ZOCOR Take 1 tablet by mouth once daily   traZODone 150 MG tablet Commonly known as: DESYREL TAKE 2 & 1/2 TABLETS BY MOUTH AT BEDTIME   traZODone 150 MG tablet Commonly known as: DESYREL Take 2.5 tablets (375 mg total) by mouth at bedtime.   Tremfya 100 MG/ML Sosy Generic drug: Guselkumab inject 162m/1ml into the skin a week 0 and 4 and them 8 weeks thereafter         ROS:  Constitutional: negative for chills, fatigue, and fevers Eyes: negative for visual disturbance and pain Ears, nose, mouth, throat, and face: negative for ear drainage, sore throat, and sinus problems Respiratory: positive for cough, negative for wheezing and shortness of breath Cardiovascular: negative for chest pain and palpitations Gastrointestinal: positive for nausea, negative for abdominal pain and vomiting Genitourinary:negative for dysuria, frequency, and urinary retention Integument/breast: positive for rash, negative for dryness Hematologic/lymphatic: negative for bleeding and lymphadenopathy Musculoskeletal:positive for back pain and joint pain, negative for neck pain Neurological: negative for dizziness, tremors, and numbness Endocrine: negative for temperature intolerance  There were no vitals taken for this visit. Physical Exam Vitals reviewed.  Constitutional:      Appearance: Normal appearance.  HENT:     Head: Normocephalic and atraumatic.  Eyes:     Extraocular Movements: Extraocular movements intact.     Pupils: Pupils are equal, round, and reactive to light.  Cardiovascular:     Rate and Rhythm: Normal rate.  Pulmonary:     Effort: Pulmonary effort is normal.  Abdominal:     Comments: Abdomen soft, nondistended, no percussion tenderness, nontender to palpation; no rigidity, guarding, rebound  tenderness; negative Murphy's; negative for bilateral CVA tenderness  Musculoskeletal:        General: Normal range of motion.     Cervical back: Normal range of motion.  Skin:    General: Skin is warm and dry.  Neurological:     General: No focal deficit present.     Mental Status: She is alert and oriented to person, place, and time.  Psychiatric:        Mood and Affect: Mood normal.        Behavior: Behavior normal.     Results: No results found for this or any previous visit (from the past 48 hour(s)).  No results found.   Assessment & Plan:  Valerie Reilly is a 46y.o. Reilly who presents for evaluation of cholelithiasis.  Her main complaint is right flank  pain.  -I explained that while gallstones could be causing her right flank pain, it is also possible that she could have a kidney stone causing this pain.  She has never been evaluated for kidney stones -I counseled the patient about the indication, risks and benefits of laparoscopic cholecystectomy.  She understands there is a very small chance for bleeding, infection, injury to normal structures (including common bile duct), conversion to open surgery, persistent symptoms, evolution of postcholecystectomy diarrhea, need for secondary interventions, anesthesia reaction, cardiopulmonary issues and other risks not specifically detailed here. I described the expected recovery, the plan for follow-up and the restrictions during the recovery phase.  All questions were answered. -Given that the patient does not have insurance, she will meet with Tito Dine, our financial assistance representative -I also recommend that she gets a noncontrasted CT of the abdomen and pelvis to rule out kidney stones.  This should be ordered by her primary care doctor -Once she has met with Tito Dine and determined an appropriate financial plan and we have obtained a CT to rule out kidney stones, we will plan to schedule her for laparoscopic  cholecystectomy -Further recommendations to follow imaging  All questions were answered to the satisfaction of the patient and family.   Graciella Freer, DO [facility] [address] [place], [place] [address] [phone] (office)

## 2021-09-04 ENCOUNTER — Telehealth: Payer: Self-pay | Admitting: Nurse Practitioner

## 2021-09-04 NOTE — Telephone Encounter (Signed)
Copied from [place] [id number]. Topic: General - Inquiry >> Sep 04, 2021 11:42 AM Marcellus Scott wrote: Reason for CRM: Pt stated she was referred to a surgeon for her gallbladder, and it was recommended that she get a CT of the abdomen and pelvis to rule out kidney stones, which should be ordered by PCP.   Please review the notes under pt Office Visit 09/01/2021. [facility]  Pt requesting a call back.

## 2021-09-04 NOTE — Telephone Encounter (Signed)
Can appointment be virtual or in person.

## 2021-09-06 ENCOUNTER — Other Ambulatory Visit: Payer: Self-pay | Admitting: Nurse Practitioner

## 2021-09-06 DIAGNOSIS — R1031 Right lower quadrant pain: Secondary | ICD-10-CM

## 2021-09-06 DIAGNOSIS — G8929 Other chronic pain: Secondary | ICD-10-CM

## 2021-09-06 NOTE — Telephone Encounter (Signed)
CT scan ordered

## 2021-09-09 NOTE — Telephone Encounter (Signed)
CT scan has been scheduled and patient has been sent a mychart message regarding appointment details.

## 2021-09-14 ENCOUNTER — Ambulatory Visit (HOSPITAL_COMMUNITY)
Admission: RE | Admit: 2021-09-14 | Discharge: 2021-09-14 | Disposition: A | Discharge status: Home / Self Care | Payer: Self-pay | Source: Ambulatory Visit | Attending: Nurse Practitioner | Admitting: Nurse Practitioner

## 2021-09-14 ENCOUNTER — Other Ambulatory Visit: Payer: Self-pay | Admitting: Nurse Practitioner

## 2021-09-14 DIAGNOSIS — R109 Unspecified abdominal pain: Secondary | ICD-10-CM

## 2021-09-14 DIAGNOSIS — R160 Hepatomegaly, not elsewhere classified: Secondary | ICD-10-CM

## 2021-09-15 ENCOUNTER — Encounter: Payer: Self-pay | Admitting: Physician Assistant

## 2021-09-18 ENCOUNTER — Other Ambulatory Visit (HOSPITAL_COMMUNITY): Payer: Self-pay | Admitting: Physician Assistant

## 2021-09-18 ENCOUNTER — Other Ambulatory Visit: Payer: Self-pay

## 2021-09-18 ENCOUNTER — Other Ambulatory Visit: Payer: Self-pay | Admitting: Family Medicine

## 2021-09-18 ENCOUNTER — Telehealth (HOSPITAL_COMMUNITY): Payer: Self-pay | Admitting: *Deleted

## 2021-09-18 DIAGNOSIS — E119 Type 2 diabetes mellitus without complications: Secondary | ICD-10-CM

## 2021-09-18 DIAGNOSIS — F319 Bipolar disorder, unspecified: Secondary | ICD-10-CM

## 2021-09-18 DIAGNOSIS — F411 Generalized anxiety disorder: Secondary | ICD-10-CM

## 2021-09-18 DIAGNOSIS — F99 Mental disorder, not otherwise specified: Secondary | ICD-10-CM

## 2021-09-18 DIAGNOSIS — E785 Hyperlipidemia, unspecified: Secondary | ICD-10-CM

## 2021-09-18 DIAGNOSIS — F5105 Insomnia due to other mental disorder: Secondary | ICD-10-CM

## 2021-09-18 NOTE — Telephone Encounter (Signed)
Requested Prescriptions  Pending Prescriptions Disp Refills  . simvastatin (ZOCOR) 40 MG tablet [Pharmacy Med Name: Simvastatin 40 MG Oral Tablet] 90 tablet 0    Sig: Take 1 tablet by mouth once daily     Cardiovascular:  Antilipid - Statins Failed - 09/18/2021 10:05 AM      Failed - Lipid Panel in normal range within the last 12 months    Cholesterol, Total  Date Value Ref Range Status  01/09/2021 184 100 - 199 mg/dL Final   LDL Chol Calc (NIH)  Date Value Ref Range Status  01/09/2021 115 (H) 0 - 99 mg/dL Final   HDL  Date Value Ref Range Status  01/09/2021 37 (L) >39 mg/dL Final   Triglycerides  Date Value Ref Range Status  01/09/2021 183 (H) 0 - 149 mg/dL Final         Passed - Patient is not pregnant      Passed - Valid encounter within last 12 months    Recent Outpatient Visits          1 month ago Type 2 diabetes mellitus without complication, with long-term current use of insulin [facility])   [place], [place], [place]   5 months ago Type 2 diabetes mellitus without complication, with long-term current use of insulin [facility])   [place], Vernia Buff, NP   8 months ago Leukocytosis, unspecified type   [place], Vernia Buff, NP   8 months ago Primary hypertension   [place], Vernia Buff, NP   9 months ago Upper respiratory infection, viral   [place], Vernia Buff, NP      Future Appointments            In 1 month Gildardo Pounds, NP [place]

## 2021-09-18 NOTE — Telephone Encounter (Signed)
Call from [facility] with [employer] that patient is having surgury and medical provider deleted her meds and refills.  Will ask provider on Monday when she returns to renew her meds.

## 2021-09-21 ENCOUNTER — Other Ambulatory Visit (HOSPITAL_COMMUNITY): Payer: Self-pay | Admitting: Psychiatry

## 2021-09-21 ENCOUNTER — Other Ambulatory Visit: Payer: Self-pay

## 2021-09-21 DIAGNOSIS — F319 Bipolar disorder, unspecified: Secondary | ICD-10-CM

## 2021-09-21 DIAGNOSIS — F411 Generalized anxiety disorder: Secondary | ICD-10-CM

## 2021-09-21 DIAGNOSIS — F5105 Insomnia due to other mental disorder: Secondary | ICD-10-CM

## 2021-09-21 MED ORDER — TRAZODONE HCL 150 MG PO TABS
ORAL_TABLET | ORAL | 3 refills | Status: DC
  Filled 2021-09-21: qty 75, 30d supply, fill #0
  Filled 2021-11-02: qty 75, 30d supply, fill #1

## 2021-09-21 MED ORDER — LITHIUM CARBONATE 300 MG PO CAPS
ORAL_CAPSULE | Freq: Two times a day (BID) | ORAL | 3 refills | Status: DC
  Filled 2021-09-21: qty 120, 30d supply, fill #0
  Filled 2021-11-02: qty 120, 30d supply, fill #1

## 2021-09-21 MED ORDER — BUSPIRONE HCL 30 MG PO TABS
ORAL_TABLET | Freq: Two times a day (BID) | ORAL | 3 refills | Status: DC
  Filled 2021-09-21: qty 180, 90d supply, fill #0

## 2021-09-21 NOTE — Telephone Encounter (Signed)
Medication refilled and sent to preferred pharmacy

## 2021-09-22 ENCOUNTER — Other Ambulatory Visit: Payer: Self-pay

## 2021-09-22 MED ORDER — GLIPIZIDE ER 10 MG PO TB24: 10.0000 mg | ORAL_TABLET | Freq: Every day | ORAL | 1 refills | Status: DC

## 2021-09-22 NOTE — Telephone Encounter (Signed)
Medication Refill - Medication: glipiZIDE (GLUCOTROL XL) 10 MG 24 hr tablet   Has the patient contacted their pharmacy? Yes.   (Agent: If no, request that the patient contact the pharmacy for the refill. If patient does not wish to contact the pharmacy document the reason why and proceed with request.) (Agent: If yes, when and what did the pharmacy advise?) Pharmacy was suppose to send request on 7.14.23 but they only requested simvastatin / pt also needs refill for her Glipizide / please advise  Preferred Pharmacy (with phone number or street name): [place], [address] [place], [place] [address]  Phone:  [phone]  Fax:  [phone]   Has the patient been seen for an appointment in the last year OR does the patient have an upcoming appointment? Yes.    Agent: Please be advised that RX refills may take up to 3 business days. We ask that you follow-up with your pharmacy.

## 2021-09-22 NOTE — Addendum Note (Signed)
Addended by: Durwin Nora on: 09/22/2021 12:30 PM   Modules accepted: Orders

## 2021-09-22 NOTE — Telephone Encounter (Signed)
Requested Prescriptions  Pending Prescriptions Disp Refills  . glipiZIDE (GLUCOTROL XL) 10 MG 24 hr tablet 90 tablet 1    Sig: Take 1 tablet (10 mg total) by mouth daily with breakfast.     Endocrinology:  Diabetes - Sulfonylureas Passed - 09/22/2021 12:30 PM      Passed - HBA1C is between 0 and 7.9 and within 180 days    HbA1c, POC (controlled diabetic range)  Date Value Ref Range Status  08/05/2021 6.3 0.0 - 7.0 % Final         Passed - Cr in normal range and within 360 days    Creatinine  Date Value Ref Range Status  02/02/2021 0.73 0.44 - 1.00 mg/dL Final         Passed - Valid encounter within last 6 months    Recent Outpatient Visits          1 month ago Type 2 diabetes mellitus without complication, with long-term current use of insulin [facility])   [place], [place], [place]   5 months ago Type 2 diabetes mellitus without complication, with long-term current use of insulin ([place])   [place], Vernia Buff, NP   8 months ago Leukocytosis, unspecified type   [place], Vernia Buff, NP   8 months ago Primary hypertension   [place], Vernia Buff, NP   9 months ago Upper respiratory infection, viral   [place], [place] W, NP      Future Appointments            In 1 month Gildardo Pounds, NP [place]           Signed Prescriptions Disp Refills   simvastatin (ZOCOR) 40 MG tablet 90 tablet 0    Sig: Take 1 tablet by mouth once daily     Cardiovascular:  Antilipid - Statins Failed - 09/18/2021 10:05 AM      Failed - Lipid Panel in normal range within the last 12 months    Cholesterol, Total  Date Value Ref Range Status  01/09/2021 184 100 - 199 mg/dL Final   LDL Chol Calc (NIH)  Date Value Ref Range Status  01/09/2021 115 (H) 0 - 99 mg/dL Final   HDL   Date Value Ref Range Status  01/09/2021 37 (L) >39 mg/dL Final   Triglycerides  Date Value Ref Range Status  01/09/2021 183 (H) 0 - 149 mg/dL Final         Passed - Patient is not pregnant      Passed - Valid encounter within last 12 months    Recent Outpatient Visits          1 month ago Type 2 diabetes mellitus without complication, with long-term current use of insulin [facility])   [place], [place], [place]   5 months ago Type 2 diabetes mellitus without complication, with long-term current use of insulin [facility])   [place], Vernia Buff, NP   8 months ago Leukocytosis, unspecified type   [place], Vernia Buff, NP   8 months ago Primary hypertension   [place], Vernia Buff, NP   9 months ago Upper respiratory infection, viral   [facility] Gildardo Pounds, NP      Future Appointments            In 1 month Gildardo Pounds, NP [place]

## 2021-09-25 ENCOUNTER — Other Ambulatory Visit: Payer: Self-pay | Admitting: Surgery

## 2021-09-25 ENCOUNTER — Telehealth: Payer: Self-pay | Admitting: Family Medicine

## 2021-09-25 DIAGNOSIS — R1011 Right upper quadrant pain: Secondary | ICD-10-CM

## 2021-09-25 MED ORDER — OXYCODONE HCL 5 MG PO TABS: 5.0000 mg | ORAL_TABLET | Freq: Three times a day (TID) | ORAL | 0 refills | Status: DC | PRN

## 2021-09-25 NOTE — Patient Instructions (Signed)
Your procedure is scheduled on: 10/02/2021  Report to [facility] at   6:00  AM.  Call this number if you have problems the morning of surgery: [phone]   Remember:   Do not Eat or Drink after midnight         No Smoking the morning of surgery  :  Take these medicines the morning of surgery with A SIP OF WATER: Buspar, pepcid, levothyroxine, meloxicam, and lithium  No diabetic medication am of surgery   Do not wear jewelry, make-up or nail polish.  Do not wear lotions, powders, or perfumes. You may wear deodorant.  Do not shave 48 hours prior to surgery. Men may shave face and neck.  Do not bring valuables to the hospital.  Contacts, dentures or bridgework may not be worn into surgery.  Leave suitcase in the car. After surgery it may be brought to your room.  For patients admitted to the hospital, checkout time is 11:00 AM the day of discharge.   Patients discharged the day of surgery will not be allowed to drive home.    Special Instructions: Shower using CHG night before surgery and shower the day of surgery use CHG.  Use special wash - you have one bottle of CHG for all showers.  You should use approximately 1/2 of the bottle for each shower.  How to Use Chlorhexidine for Bathing Chlorhexidine gluconate (CHG) is a germ-killing (antiseptic) solution that is used to clean the skin. It can get rid of the bacteria that normally live on the skin and can keep them away for about 24 hours. To clean your skin with CHG, you may be given: A CHG solution to use in the shower or as part of a sponge bath. A prepackaged cloth that contains CHG. Cleaning your skin with CHG may help lower the risk for infection: While you are staying in the intensive care unit of the hospital. If you have a vascular access, such as a central line, to provide short-term or long-term access to your veins. If you have a catheter to drain urine from your bladder. If you are on a ventilator. A  ventilator is a machine that helps you breathe by moving air in and out of your lungs. After surgery. What are the risks? Risks of using CHG include: A skin reaction. Hearing loss, if CHG gets in your ears and you have a perforated eardrum. Eye injury, if CHG gets in your eyes and is not rinsed out. The CHG product catching fire. Make sure that you avoid smoking and flames after applying CHG to your skin. Do not use CHG: If you have a chlorhexidine allergy or have previously reacted to chlorhexidine. On babies younger than 42 months of age. How to use CHG solution Use CHG only as told by your health care provider, and follow the instructions on the label. Use the full amount of CHG as directed. Usually, this is one bottle. During a shower Follow these steps when using CHG solution during a shower (unless your health care provider gives you different instructions): Start the shower. Use your normal soap and shampoo to wash your face and hair. Turn off the shower or move out of the shower stream. Pour the CHG onto a clean washcloth. Do not use any type of brush or rough-edged sponge. Starting at your neck, lather your body down to your toes. Make sure you follow these instructions: If you will be having surgery, pay special attention to  the part of your body where you will be having surgery. Scrub this area for at least 1 minute. Do not use CHG on your head or face. If the solution gets into your ears or eyes, rinse them well with water. Avoid your genital area. Avoid any areas of skin that have broken skin, cuts, or scrapes. Scrub your back and under your arms. Make sure to wash skin folds. Let the lather sit on your skin for 1-2 minutes or as long as told by your health care provider. Thoroughly rinse your entire body in the shower. Make sure that all body creases and crevices are rinsed well. Dry off with a clean towel. Do not put any substances on your body afterward--such as powder,  lotion, or perfume--unless you are told to do so by your health care provider. Only use lotions that are recommended by the manufacturer. Put on clean clothes or pajamas. If it is the night before your surgery, sleep in clean sheets.  During a sponge bath Follow these steps when using CHG solution during a sponge bath (unless your health care provider gives you different instructions): Use your normal soap and shampoo to wash your face and hair. Pour the CHG onto a clean washcloth. Starting at your neck, lather your body down to your toes. Make sure you follow these instructions: If you will be having surgery, pay special attention to the part of your body where you will be having surgery. Scrub this area for at least 1 minute. Do not use CHG on your head or face. If the solution gets into your ears or eyes, rinse them well with water. Avoid your genital area. Avoid any areas of skin that have broken skin, cuts, or scrapes. Scrub your back and under your arms. Make sure to wash skin folds. Let the lather sit on your skin for 1-2 minutes or as long as told by your health care provider. Using a different clean, wet washcloth, thoroughly rinse your entire body. Make sure that all body creases and crevices are rinsed well. Dry off with a clean towel. Do not put any substances on your body afterward--such as powder, lotion, or perfume--unless you are told to do so by your health care provider. Only use lotions that are recommended by the manufacturer. Put on clean clothes or pajamas. If it is the night before your surgery, sleep in clean sheets. How to use CHG prepackaged cloths Only use CHG cloths as told by your health care provider, and follow the instructions on the label. Use the CHG cloth on clean, dry skin. Do not use the CHG cloth on your head or face unless your health care provider tells you to. When washing with the CHG cloth: Avoid your genital area. Avoid any areas of skin that have  broken skin, cuts, or scrapes. Before surgery Follow these steps when using a CHG cloth to clean before surgery (unless your health care provider gives you different instructions): Using the CHG cloth, vigorously scrub the part of your body where you will be having surgery. Scrub using a back-and-forth motion for 3 minutes. The area on your body should be completely wet with CHG when you are done scrubbing. Do not rinse. Discard the cloth and let the area air-dry. Do not put any substances on the area afterward, such as powder, lotion, or perfume. Put on clean clothes or pajamas. If it is the night before your surgery, sleep in clean sheets.  For general bathing Follow these steps  when using CHG cloths for general bathing (unless your health care provider gives you different instructions). Use a separate CHG cloth for each area of your body. Make sure you wash between any folds of skin and between your fingers and toes. Wash your body in the following order, switching to a new cloth after each step: The front of your neck, shoulders, and chest. Both of your arms, under your arms, and your hands. Your stomach and groin area, avoiding the genitals. Your right leg and foot. Your left leg and foot. The back of your neck, your back, and your buttocks. Do not rinse. Discard the cloth and let the area air-dry. Do not put any substances on your body afterward--such as powder, lotion, or perfume--unless you are told to do so by your health care provider. Only use lotions that are recommended by the manufacturer. Put on clean clothes or pajamas. Contact a health care provider if: Your skin gets irritated after scrubbing. You have questions about using your solution or cloth. You swallow any chlorhexidine. Call your local poison control center (1-6018005306 in the [place] Get help right away if: Your eyes itch badly, or they become very red or swollen. Your skin itches badly and is red or  swollen. Your hearing changes. You have trouble seeing. You have swelling or tingling in your mouth or throat. You have trouble breathing. These symptoms may represent a serious problem that is an emergency. Do not wait to see if the symptoms will go away. Get medical help right away. Call your local emergency services (911 in the [place] Do not drive yourself to the hospital. Summary Chlorhexidine gluconate (CHG) is a germ-killing (antiseptic) solution that is used to clean the skin. Cleaning your skin with CHG may help to lower your risk for infection. You may be given CHG to use for bathing. It may be in a bottle or in a prepackaged cloth to use on your skin. Carefully follow your health care provider's instructions and the instructions on the product label. Do not use CHG if you have a chlorhexidine allergy. Contact your health care provider if your skin gets irritated after scrubbing. This information is not intended to replace advice given to you by your health care provider. Make sure you discuss any questions you have with your health care provider. Document Revised: 05/05/2020 Document Reviewed: 05/05/2020 Elsevier Patient Education  [place]. Minimally Invasive Cholecystectomy, Care After The following information offers guidance on how to care for yourself after your procedure. Your health care provider may also give you more specific instructions. If you have problems or questions, contact your health care provider. What can I expect after the procedure? After the procedure, it is common to have: Pain at your incision sites. You will be given medicines to control this pain. Mild nausea or vomiting. Bloating and possible shoulder pain from the gas that was used during the procedure. Follow these instructions at home: Medicines Take over-the-counter and prescription medicines only as told by your health care provider. If you were prescribed an antibiotic medicine, take it  as told by your health care provider. Do not stop using the antibiotic even if you start to feel better. Ask your health care provider if the medicine prescribed to you: Requires you to avoid driving or using machinery. Can cause constipation. You may need to take these actions to prevent or treat constipation: Drink enough fluid to keep your urine pale yellow. Take over-the-counter or prescription medicines. Eat foods that are  high in fiber, such as beans, whole grains, and fresh fruits and vegetables. Limit foods that are high in fat and processed sugars, such as fried or sweet foods. Incision care  Follow instructions from your health care provider about how to take care of your incisions. Make sure you: Wash your hands with soap and water for at least 20 seconds before and after you change your bandage (dressing). If soap and water are not available, use hand sanitizer. Change your dressing as told by your health care provider. Leave stitches (sutures), skin glue, or adhesive strips in place. These skin closures may need to be in place for 2 weeks or longer. If adhesive strip edges start to loosen and curl up, you may trim the loose edges. Do not remove adhesive strips completely unless your health care provider tells you to do that. Do not take baths, swim, or use a hot tub until your health care provider approves. Ask your health care provider if you may take showers. You may only be allowed to take sponge baths. Check your incision area every day for signs of infection. Check for: More redness, swelling, or pain. Fluid or blood. Warmth. Pus or a bad smell. Activity Rest as told by your health care provider. Do not do activities that require a lot of effort. Avoid sitting for a long time without moving. Get up to take short walks every 1-2 hours. This is important to improve blood flow and breathing. Ask for help if you feel weak or unsteady. Do not lift anything that is heavier than 10  lb (4.5 kg), or the limit that you are told, until your health care provider says that it is safe. Do not play contact sports until your health care provider approves. Do not return to work or school until your health care provider approves. Return to your normal activities as told by your health care provider. Ask your health care provider what activities are safe for you. General instructions If you were given a sedative during the procedure, it can affect you for several hours. Do not drive or operate machinery until your health care provider says that it is safe. Keep all follow-up visits. This is important. Contact a health care provider if: You develop a rash. You have more redness, swelling, or pain around your incisions. You have fluid or blood coming from your incisions. Your incisions feel warm to the touch. You have pus or a bad smell coming from your incisions. You have a fever. One or more of your incisions breaks open. Get help right away if: You have trouble breathing. You have chest pain. You have more pain in your shoulders. You faint or feel dizzy when you stand. You have severe pain in your abdomen. You have nausea or vomiting that lasts for more than one day. You have leg pain that is new or unusual, or if it is localized to one specific spot. These symptoms may represent a serious problem that is an emergency. Do not wait to see if the symptoms will go away. Get medical help right away. Call your local emergency services (911 in the [place] Do not drive yourself to the hospital. Summary After your procedure, it is common to have pain at the incision sites. You may also have nausea or bloating. Follow your health care provider's instructions about medicine, activity restrictions, and caring for your incision areas. Do not do activities that require a lot of effort. Contact a health care provider if you  have a fever or other signs of infection, such as more redness,  swelling, or pain around the incisions. Get help right away if you have chest pain, increasing pain in the shoulders, or trouble breathing. This information is not intended to replace advice given to you by your health care provider. Make sure you discuss any questions you have with your health care provider. Document Revised: 08/26/2020 Document Reviewed: 08/26/2020 Elsevier Patient Education  [place] Anesthesia, Adult, Care After This sheet gives you information about how to care for yourself after your procedure. Your health care provider may also give you more specific instructions. If you have problems or questions, contact your health care provider. What can I expect after the procedure? After the procedure, the following side effects are common: Pain or discomfort at the IV site. Nausea. Vomiting. Sore throat. Trouble concentrating. Feeling cold or chills. Feeling weak or tired. Sleepiness and fatigue. Soreness and body aches. These side effects can affect parts of the body that were not involved in surgery. Follow these instructions at home: For the time period you were told by your health care provider:  Rest. Do not participate in activities where you could fall or become injured. Do not drive or use machinery. Do not drink alcohol. Do not take sleeping pills or medicines that cause drowsiness. Do not make important decisions or sign legal documents. Do not take care of children on your own. Eating and drinking Follow any instructions from your health care provider about eating or drinking restrictions. When you feel hungry, start by eating small amounts of foods that are soft and easy to digest (bland), such as toast. Gradually return to your regular diet. Drink enough fluid to keep your urine pale yellow. If you vomit, rehydrate by drinking water, juice, or clear broth. General instructions If you have sleep apnea, surgery and certain medicines can  increase your risk for breathing problems. Follow instructions from your health care provider about wearing your sleep device: Anytime you are sleeping, including during daytime naps. While taking prescription pain medicines, sleeping medicines, or medicines that make you drowsy. Have a responsible adult stay with you for the time you are told. It is important to have someone help care for you until you are awake and alert. Return to your normal activities as told by your health care provider. Ask your health care provider what activities are safe for you. Take over-the-counter and prescription medicines only as told by your health care provider. If you smoke, do not smoke without supervision. Keep all follow-up visits as told by your health care provider. This is important. Contact a health care provider if: You have nausea or vomiting that does not get better with medicine. You cannot eat or drink without vomiting. You have pain that does not get better with medicine. You are unable to pass urine. You develop a skin rash. You have a fever. You have redness around your IV site that gets worse. Get help right away if: You have difficulty breathing. You have chest pain. You have blood in your urine or stool, or you vomit blood. Summary After the procedure, it is common to have a sore throat or nausea. It is also common to feel tired. Have a responsible adult stay with you for the time you are told. It is important to have someone help care for you until you are awake and alert. When you feel hungry, start by eating small amounts of foods that are soft and  easy to digest (bland), such as toast. Gradually return to your regular diet. Drink enough fluid to keep your urine pale yellow. Return to your normal activities as told by your health care provider. Ask your health care provider what activities are safe for you. This information is not intended to replace advice given to you by your health  care provider. Make sure you discuss any questions you have with your health care provider. Document Revised: 11/08/2019 Document Reviewed: 06/07/2019 Elsevier Patient Education  [place].

## 2021-09-25 NOTE — Telephone Encounter (Signed)
Patient called and states that she is in pain and would like something stronger then the Meloxicam. She states that the Meloxicam helps but does not relieve completely.   Per Dr. Okey Dupre - she can have a few oxycodone and she must use sparingly. Take Tylenol with the Meloxicam and the Oxycodone only for severe pain. Only 10 will be given until surgery. Also watch diet and stay away from greasy, fried and spicy foods.   Patient aware of providers recommendations and verbalizes understanding.

## 2021-09-29 ENCOUNTER — Encounter (HOSPITAL_COMMUNITY)
Admission: RE | Admit: 2021-09-29 | Discharge: 2021-09-29 | Disposition: A | Discharge status: Home / Self Care | Payer: No Typology Code available for payment source | Source: Ambulatory Visit | Attending: Surgery | Admitting: Surgery

## 2021-09-29 VITALS — BP 145/81 | HR 71 | Temp 97.6°F | Resp 18 | Ht 67.0 in | Wt 230.0 lb

## 2021-09-29 DIAGNOSIS — Z01812 Encounter for preprocedural laboratory examination: Secondary | ICD-10-CM | POA: Insufficient documentation

## 2021-09-29 DIAGNOSIS — I10 Essential (primary) hypertension: Secondary | ICD-10-CM | POA: Insufficient documentation

## 2021-09-29 DIAGNOSIS — Z01818 Encounter for other preprocedural examination: Secondary | ICD-10-CM

## 2021-09-29 LAB — POCT PREGNANCY, URINE: Preg Test, Ur: NEGATIVE

## 2021-09-30 ENCOUNTER — Telehealth (HOSPITAL_COMMUNITY): Payer: Self-pay | Admitting: Physician Assistant

## 2021-10-02 ENCOUNTER — Ambulatory Visit (HOSPITAL_BASED_OUTPATIENT_CLINIC_OR_DEPARTMENT_OTHER): Payer: No Typology Code available for payment source

## 2021-10-02 ENCOUNTER — Encounter (HOSPITAL_COMMUNITY)
Admission: RE | Disposition: A | Discharge status: Home / Self Care | Payer: Self-pay | Source: Ambulatory Visit | Attending: Surgery

## 2021-10-02 ENCOUNTER — Encounter (HOSPITAL_COMMUNITY): Payer: Self-pay | Admitting: Surgery

## 2021-10-02 ENCOUNTER — Ambulatory Visit (HOSPITAL_COMMUNITY)
Admission: RE | Admit: 2021-10-02 | Discharge: 2021-10-02 | Disposition: A | Discharge status: Home / Self Care | Payer: No Typology Code available for payment source | Source: Ambulatory Visit | Attending: Surgery | Admitting: Surgery

## 2021-10-02 ENCOUNTER — Other Ambulatory Visit: Payer: Self-pay

## 2021-10-02 ENCOUNTER — Ambulatory Visit (HOSPITAL_COMMUNITY): Payer: No Typology Code available for payment source

## 2021-10-02 DIAGNOSIS — J45909 Unspecified asthma, uncomplicated: Secondary | ICD-10-CM | POA: Insufficient documentation

## 2021-10-02 DIAGNOSIS — E119 Type 2 diabetes mellitus without complications: Secondary | ICD-10-CM | POA: Insufficient documentation

## 2021-10-02 DIAGNOSIS — K219 Gastro-esophageal reflux disease without esophagitis: Secondary | ICD-10-CM | POA: Insufficient documentation

## 2021-10-02 DIAGNOSIS — K8020 Calculus of gallbladder without cholecystitis without obstruction: Secondary | ICD-10-CM

## 2021-10-02 DIAGNOSIS — K8010 Calculus of gallbladder with chronic cholecystitis without obstruction: Secondary | ICD-10-CM | POA: Insufficient documentation

## 2021-10-02 DIAGNOSIS — E039 Hypothyroidism, unspecified: Secondary | ICD-10-CM | POA: Insufficient documentation

## 2021-10-02 DIAGNOSIS — Z7984 Long term (current) use of oral hypoglycemic drugs: Secondary | ICD-10-CM | POA: Insufficient documentation

## 2021-10-02 DIAGNOSIS — E785 Hyperlipidemia, unspecified: Secondary | ICD-10-CM | POA: Insufficient documentation

## 2021-10-02 DIAGNOSIS — F319 Bipolar disorder, unspecified: Secondary | ICD-10-CM | POA: Insufficient documentation

## 2021-10-02 DIAGNOSIS — I10 Essential (primary) hypertension: Secondary | ICD-10-CM | POA: Insufficient documentation

## 2021-10-02 DIAGNOSIS — F17210 Nicotine dependence, cigarettes, uncomplicated: Secondary | ICD-10-CM | POA: Insufficient documentation

## 2021-10-02 DIAGNOSIS — F419 Anxiety disorder, unspecified: Secondary | ICD-10-CM | POA: Insufficient documentation

## 2021-10-02 HISTORY — PX: CHOLECYSTECTOMY: SHX55

## 2021-10-02 LAB — GLUCOSE, CAPILLARY
Glucose-Capillary: 102 mg/dL — ABNORMAL HIGH (ref 70–99)
Glucose-Capillary: 209 mg/dL — ABNORMAL HIGH (ref 70–99)

## 2021-10-02 SURGERY — LAPAROSCOPIC CHOLECYSTECTOMY
Anesthesia: General | Site: Abdomen

## 2021-10-02 MED ORDER — ROCURONIUM BROMIDE 10 MG/ML (PF) SYRINGE
PREFILLED_SYRINGE | INTRAVENOUS | Status: AC
  Filled 2021-10-02: qty 10

## 2021-10-02 MED ORDER — FENTANYL CITRATE (PF) 100 MCG/2ML IJ SOLN
INTRAMUSCULAR | Status: AC
  Filled 2021-10-02: qty 2

## 2021-10-02 MED ORDER — ROCURONIUM BROMIDE 10 MG/ML (PF) SYRINGE
PREFILLED_SYRINGE | INTRAVENOUS | Status: DC | PRN
  Administered 2021-10-02: 30 mg via INTRAVENOUS

## 2021-10-02 MED ORDER — OXYCODONE HCL 5 MG PO TABS
5.0000 mg | ORAL_TABLET | Freq: Once | ORAL | Status: AC
  Administered 2021-10-02: 5 mg via ORAL

## 2021-10-02 MED ORDER — PROPOFOL 10 MG/ML IV BOLUS
INTRAVENOUS | Status: DC | PRN
  Administered 2021-10-02: 30 mg via INTRAVENOUS
  Administered 2021-10-02: 150 mg via INTRAVENOUS

## 2021-10-02 MED ORDER — SUCCINYLCHOLINE CHLORIDE 200 MG/10ML IV SOSY
PREFILLED_SYRINGE | INTRAVENOUS | Status: AC
  Filled 2021-10-02: qty 10

## 2021-10-02 MED ORDER — ONDANSETRON HCL 4 MG/2ML IJ SOLN: 4.0000 mg | Freq: Once | INTRAMUSCULAR | Status: DC | PRN

## 2021-10-02 MED ORDER — DEXMEDETOMIDINE (PRECEDEX) IN NS 20 MCG/5ML (4 MCG/ML) IV SYRINGE
PREFILLED_SYRINGE | INTRAVENOUS | Status: DC | PRN
  Administered 2021-10-02: 10 ug via INTRAVENOUS

## 2021-10-02 MED ORDER — SODIUM CHLORIDE 0.9 % IR SOLN
Status: DC | PRN
  Administered 2021-10-02: 1000 mL

## 2021-10-02 MED ORDER — SUGAMMADEX SODIUM 200 MG/2ML IV SOLN
INTRAVENOUS | Status: DC | PRN
  Administered 2021-10-02: 210 mg via INTRAVENOUS

## 2021-10-02 MED ORDER — OXYCODONE HCL 5 MG PO TABS
ORAL_TABLET | ORAL | Status: AC
  Filled 2021-10-02: qty 1

## 2021-10-02 MED ORDER — ONDANSETRON HCL 4 MG/2ML IJ SOLN
INTRAMUSCULAR | Status: DC | PRN
  Administered 2021-10-02: 4 mg via INTRAVENOUS

## 2021-10-02 MED ORDER — DEXMEDETOMIDINE HCL IN NACL 80 MCG/20ML IV SOLN
INTRAVENOUS | Status: AC
  Filled 2021-10-02: qty 20

## 2021-10-02 MED ORDER — CHLORHEXIDINE GLUCONATE CLOTH 2 % EX PADS: 6.0000 | MEDICATED_PAD | Freq: Once | CUTANEOUS | Status: DC

## 2021-10-02 MED ORDER — ONDANSETRON HCL 4 MG/2ML IJ SOLN
INTRAMUSCULAR | Status: AC
  Filled 2021-10-02: qty 2

## 2021-10-02 MED ORDER — LIDOCAINE HCL (PF) 2 % IJ SOLN
INTRAMUSCULAR | Status: AC
  Filled 2021-10-02: qty 10

## 2021-10-02 MED ORDER — LACTATED RINGERS IV SOLN: INTRAVENOUS | Status: DC

## 2021-10-02 MED ORDER — DEXAMETHASONE SODIUM PHOSPHATE 10 MG/ML IJ SOLN
INTRAMUSCULAR | Status: DC | PRN
  Administered 2021-10-02: 4 mg via INTRAVENOUS

## 2021-10-02 MED ORDER — MIDAZOLAM HCL 2 MG/2ML IJ SOLN
INTRAMUSCULAR | Status: AC
  Filled 2021-10-02: qty 2

## 2021-10-02 MED ORDER — PROPOFOL 10 MG/ML IV BOLUS
INTRAVENOUS | Status: AC
  Filled 2021-10-02: qty 20

## 2021-10-02 MED ORDER — MIDAZOLAM HCL 5 MG/5ML IJ SOLN
INTRAMUSCULAR | Status: DC | PRN
  Administered 2021-10-02: 2 mg via INTRAVENOUS

## 2021-10-02 MED ORDER — OXYCODONE HCL 5 MG PO TABS: 5.0000 mg | ORAL_TABLET | Freq: Four times a day (QID) | ORAL | 0 refills | Status: DC | PRN

## 2021-10-02 MED ORDER — CHLORHEXIDINE GLUCONATE 0.12 % MT SOLN
15.0000 mL | Freq: Once | OROMUCOSAL | Status: AC
  Administered 2021-10-02: 15 mL via OROMUCOSAL

## 2021-10-02 MED ORDER — DOCUSATE SODIUM 100 MG PO CAPS: 100.0000 mg | ORAL_CAPSULE | Freq: Two times a day (BID) | ORAL | 2 refills | Status: DC

## 2021-10-02 MED ORDER — LIDOCAINE HCL (CARDIAC) PF 100 MG/5ML IV SOSY
PREFILLED_SYRINGE | INTRAVENOUS | Status: DC | PRN
  Administered 2021-10-02: 50 mg via INTRAVENOUS

## 2021-10-02 MED ORDER — SUCCINYLCHOLINE CHLORIDE 200 MG/10ML IV SOSY
PREFILLED_SYRINGE | INTRAVENOUS | Status: DC | PRN
  Administered 2021-10-02: 120 mg via INTRAVENOUS

## 2021-10-02 MED ORDER — BUPIVACAINE HCL (PF) 0.5 % IJ SOLN
INTRAMUSCULAR | Status: AC
  Filled 2021-10-02: qty 30

## 2021-10-02 MED ORDER — FENTANYL CITRATE PF 50 MCG/ML IJ SOSY
25.0000 ug | PREFILLED_SYRINGE | INTRAMUSCULAR | Status: DC | PRN
  Administered 2021-10-02: 50 ug via INTRAVENOUS

## 2021-10-02 MED ORDER — HEMOSTATIC AGENTS (NO CHARGE) OPTIME
TOPICAL | Status: DC | PRN
  Administered 2021-10-02 (×2): 1 via TOPICAL

## 2021-10-02 MED ORDER — ACETAMINOPHEN 500 MG PO TABS: 1000.0000 mg | ORAL_TABLET | Freq: Four times a day (QID) | ORAL | 0 refills | Status: AC

## 2021-10-02 MED ORDER — SCOPOLAMINE 1 MG/3DAYS TD PT72
1.0000 | MEDICATED_PATCH | Freq: Once | TRANSDERMAL | Status: DC
  Administered 2021-10-02: 1.5 mg via TRANSDERMAL
  Filled 2021-10-02: qty 1

## 2021-10-02 MED ORDER — FENTANYL CITRATE (PF) 100 MCG/2ML IJ SOLN
INTRAMUSCULAR | Status: DC | PRN
  Administered 2021-10-02 (×5): 50 ug via INTRAVENOUS

## 2021-10-02 MED ORDER — DEXAMETHASONE SODIUM PHOSPHATE 10 MG/ML IJ SOLN
INTRAMUSCULAR | Status: AC
  Filled 2021-10-02: qty 1

## 2021-10-02 MED ORDER — ORAL CARE MOUTH RINSE: 15.0000 mL | Freq: Once | OROMUCOSAL | Status: AC

## 2021-10-02 MED ORDER — BUPIVACAINE HCL (PF) 0.5 % IJ SOLN
INTRAMUSCULAR | Status: DC | PRN
  Administered 2021-10-02: 14 mL

## 2021-10-02 MED ORDER — CEFOTETAN DISODIUM 2 G IJ SOLR
2.0000 g | INTRAMUSCULAR | Status: AC
  Administered 2021-10-02: 2 g via INTRAVENOUS
  Filled 2021-10-02: qty 2

## 2021-10-02 MED ORDER — FENTANYL CITRATE PF 50 MCG/ML IJ SOSY
PREFILLED_SYRINGE | INTRAMUSCULAR | Status: AC
  Filled 2021-10-02: qty 1

## 2021-10-02 SURGICAL SUPPLY — 43 items
ADH SKN CLS APL DERMABOND .7 (GAUZE/BANDAGES/DRESSINGS) ×1
APL PRP STRL LF DISP 70% ISPRP (MISCELLANEOUS) ×1
APPLIER CLIP ROT 10 11.4 M/L (STAPLE) ×2
APR CLP MED LRG 11.4X10 (STAPLE) ×1
BLADE SURG 15 STRL LF DISP TIS (BLADE) ×2 IMPLANT
BLADE SURG 15 STRL SS (BLADE) ×2
CHLORAPREP W/TINT 26 (MISCELLANEOUS) ×3 IMPLANT
CLIP APPLIE ROT 10 11.4 M/L (STAPLE) ×2 IMPLANT
CLOTH BEACON ORANGE TIMEOUT ST (SAFETY) ×3 IMPLANT
COVER LIGHT HANDLE STERIS (MISCELLANEOUS) ×6 IMPLANT
DECANTER SPIKE VIAL GLASS SM (MISCELLANEOUS) ×3 IMPLANT
DERMABOND ADVANCED (GAUZE/BANDAGES/DRESSINGS) ×1
DERMABOND ADVANCED .7 DNX12 (GAUZE/BANDAGES/DRESSINGS) ×2 IMPLANT
ELECT REM PT RETURN 9FT ADLT (ELECTROSURGICAL) ×2
ELECTRODE REM PT RTRN 9FT ADLT (ELECTROSURGICAL) ×2 IMPLANT
GAUZE 4X4 16PLY ~~LOC~~+RFID DBL (SPONGE) ×1 IMPLANT
GLOVE BIOGEL PI IND STRL 6.5 (GLOVE) ×2 IMPLANT
GLOVE BIOGEL PI IND STRL 7.0 (GLOVE) ×4 IMPLANT
GLOVE BIOGEL PI INDICATOR 6.5 (GLOVE) ×1
GLOVE BIOGEL PI INDICATOR 7.0 (GLOVE) ×2
GLOVE SURG SS PI 6.5 STRL IVOR (GLOVE) ×6 IMPLANT
GOWN STRL REUS W/TWL LRG LVL3 (GOWN DISPOSABLE) ×9 IMPLANT
HEMOSTAT SNOW SURGICEL 2X4 (HEMOSTASIS) ×4 IMPLANT
INST SET LAPROSCOPIC AP (KITS) ×3 IMPLANT
KIT TURNOVER KIT A (KITS) ×3 IMPLANT
MANIFOLD NEPTUNE II (INSTRUMENTS) ×3 IMPLANT
NDL INSUFFLATION 14GA 120MM (NEEDLE) ×2 IMPLANT
NEEDLE INSUFFLATION 14GA 120MM (NEEDLE) ×2 IMPLANT
NS IRRIG 1000ML POUR BTL (IV SOLUTION) ×3 IMPLANT
PACK LAP CHOLE LZT030E (CUSTOM PROCEDURE TRAY) ×3 IMPLANT
PAD ARMBOARD 7.5X6 YLW CONV (MISCELLANEOUS) ×3 IMPLANT
SET BASIN LINEN APH (SET/KITS/TRAYS/PACK) ×3 IMPLANT
SET TUBE SMOKE EVAC HIGH FLOW (TUBING) ×3 IMPLANT
SLEEVE Z-THREAD 5X100MM (TROCAR) ×3 IMPLANT
SUT MNCRL AB 4-0 PS2 18 (SUTURE) ×6 IMPLANT
SUT VICRYL 0 UR6 27IN ABS (SUTURE) ×3 IMPLANT
SYS BAG RETRIEVAL 10MM (BASKET) ×2
SYSTEM BAG RETRIEVAL 10MM (BASKET) ×2 IMPLANT
TROCAR Z-THRD FIOS HNDL 11X100 (TROCAR) ×3 IMPLANT
TROCAR Z-THREAD FIOS 5X100MM (TROCAR) ×3 IMPLANT
TROCAR Z-THREAD SLEEVE 11X100 (TROCAR) ×3 IMPLANT
TUBE CONNECTING 12X1/4 (SUCTIONS) ×3 IMPLANT
WARMER LAPAROSCOPE (MISCELLANEOUS) ×3 IMPLANT

## 2021-10-02 NOTE — H&P (Signed)
[facility] History and Physical   Reason for Referral: Cholelithiasis Referring Physician: Dr. Bruna Potter Valerie is a 46 y.o. female.  HPI: Patient presents for evaluation of cholelithiasis.  She states that she has been having right flank pain on and off for multiple years.  She states that the pain is not associated with anything in particular.  She has been taking meloxicam for the pain, which does take the edge off.  She has never been evaluated for kidney stones.  She has occasional nausea, but that does not to him to be associated with anything.  She denies any episodes of vomiting.  She has had occasional epigastric pain, but again this is nonspecific.  She denies any postprandial abdominal pain or nausea.  She denies fevers or chills.  Her surgical history significant for tubal ligation.  Her medical history significant for diabetes, hypertension, hyperlipidemia, and hypothyroidism.  She smokes 1 pack/day, and occasionally drinks alcohol.  She denies use of illicit drugs.  She denies use of blood thinning medications.       Past Medical History:  Diagnosis Date   Asthma     Bipolar disorder ([place])     Diabetes mellitus     Diabetes mellitus, type II ([place])     GERD (gastroesophageal reflux disease)     History of borderline personality disorder     Hypercholesteremia     Hypertension     Psoriasis     Psoriasis     Seasonal allergies     Thyroid disease             Past Surgical History:  Procedure Laterality Date   TONSILLECTOMY       TUBAL LIGATION               Family History  Problem Relation Age of Onset   Personality disorder Mother     Alcohol abuse Maternal Uncle     Depression Maternal Uncle     Breast cancer Neg Hx        Social History         Tobacco Use   Smoking status: Every Day      Packs/day: 1.00      Types: Cigarettes, E-cigarettes   Smokeless tobacco: Never  Vaping Use   Vaping Use: Former  Substance Use  Topics   Alcohol use: Yes      Alcohol/week: 0.0 standard drinks of alcohol      Comment: occasional- a few times a year. 6-7 drinks per episdoe   Drug use: No      Types: Marijuana      Comment: last time was 3 yrs ago      Medications: I have reviewed the patient's current medications. Allergies as of 09/01/2021   No Known Allergies         Medication List           Accurate as of September 01, 2021  8:53 AM. If you have any questions, ask your nurse or doctor.              benazepril 20 MG tablet Commonly known as: LOTENSIN Take 1 tablet by mouth once daily    busPIRone 30 MG tablet Commonly known as: BUSPAR TAKE 1 TABLET (30 MG TOTAL) BY MOUTH 2 (TWO) TIMES DAILY.    busPIRone 30 MG tablet Commonly known as: BUSPAR Take 1 tablet (30 mg total) by mouth 2 (two) times daily.  cholecalciferol 1000 units tablet Commonly known as: VITAMIN D Take 1,000 Units by mouth daily.    fexofenadine 180 MG tablet Commonly known as: ALLEGRA Take 180 mg by mouth daily.    fluticasone 50 MCG/ACT nasal spray Commonly known as: FLONASE PLACE 2 SPRAYS INTO BOTH NOSTRILS DAILY.    glipiZIDE 10 MG 24 hr tablet Commonly known as: GLUCOTROL XL Take 1 tablet by mouth once daily with breakfast    hydroxypropyl methylcellulose / hypromellose 2.5 % ophthalmic solution Commonly known as: ISOPTO TEARS / GONIOVISC Place 1 drop into both eyes in the morning and at bedtime.    levothyroxine 50 MCG tablet Commonly known as: SYNTHROID Take 1 tablet (50 mcg total) by mouth daily before breakfast.    lithium carbonate 300 MG capsule Take 2 capsules by mouth 2 (two) times daily.    lithium carbonate 300 MG capsule Take 2 capsules (600 mg total) by mouth 2 (two) times daily.    meloxicam 15 MG tablet Commonly known as: MOBIC Take 1 tablet (15 mg total) by mouth daily. Prn pain    metFORMIN 1000 MG tablet Commonly known as: GLUCOPHAGE Take 1 tablet (1,000 mg total) by mouth 2 (two)  times daily with a meal.    metroNIDAZOLE 500 MG tablet Commonly known as: FLAGYL Take 1 tablet (500 mg total) by mouth 2 (two) times daily.    multivitamin capsule Take 1 capsule by mouth daily.    PEPCID PO Take by mouth.    simvastatin 40 MG tablet Commonly known as: ZOCOR Take 1 tablet by mouth once daily    traZODone 150 MG tablet Commonly known as: DESYREL TAKE 2 & 1/2 TABLETS BY MOUTH AT BEDTIME    traZODone 150 MG tablet Commonly known as: DESYREL Take 2.5 tablets (375 mg total) by mouth at bedtime.    Tremfya 100 MG/ML Sosy Generic drug: Guselkumab inject '100mg'$ /18m into the skin a week 0 and 4 and them 8 weeks thereafter               ROS:  Constitutional: negative for chills, fatigue, and fevers Eyes: negative for visual disturbance and pain Ears, nose, mouth, throat, and face: negative for ear drainage, sore throat, and sinus problems Respiratory: positive for cough, negative for wheezing and shortness of breath Cardiovascular: negative for chest pain and palpitations Gastrointestinal: positive for nausea, negative for abdominal pain and vomiting Genitourinary:negative for dysuria, frequency, and urinary retention Integument/breast: positive for rash, negative for dryness Hematologic/lymphatic: negative for bleeding and lymphadenopathy Musculoskeletal:positive for back pain and joint pain, negative for neck pain Neurological: negative for dizziness, tremors, and numbness Endocrine: negative for temperature intolerance   There were no vitals taken for this visit. Physical Exam Vitals reviewed.  Constitutional:      Appearance: Normal appearance.  HENT:     Head: Normocephalic and atraumatic.  Eyes:     Extraocular Movements: Extraocular movements intact.     Pupils: Pupils are equal, round, and reactive to light.  Cardiovascular:     Rate and Rhythm: Normal rate.  Pulmonary:     Effort: Pulmonary effort is normal.  Abdominal:     Comments:  Abdomen soft, nondistended, no percussion tenderness, nontender to palpation; no rigidity, guarding, rebound tenderness; negative Murphy's; negative for bilateral CVA tenderness  Musculoskeletal:        General: Normal range of motion.     Cervical back: Normal range of motion.  Skin:    General: Skin is warm and dry.  Neurological:  General: No focal deficit present.     Mental Status: She is alert and oriented to person, place, and time.  Psychiatric:        Mood and Affect: Mood normal.        Behavior: Behavior normal.        Results: Lab Results Last 48 Hours  No results found for this or any previous visit (from the past 48 hour(s)).     Imaging Results (Last 48 hours)  No results found.       Assessment & Plan:  Valerie Reilly is a 46 y.o. female who presents for evaluation of cholelithiasis.  Her main complaint is right flank pain.   -I counseled the patient about the indication, risks and benefits of laparoscopic cholecystectomy.  She understands there is a very small chance for bleeding, infection, injury to normal structures (including common bile duct), conversion to open surgery, persistent symptoms, evolution of postcholecystectomy diarrhea, need for secondary interventions, anesthesia reaction, cardiopulmonary issues and other risks not specifically detailed here. I described the expected recovery, the plan for follow-up and the restrictions during the recovery phase.  All questions were answered. -Plan for laparoscopic cholecystectomy today -CT abdomen and pelvis without signs of nephrolithiasis   All questions were answered to the satisfaction of the patient and family.  Graciella Freer, DO [facility] [address] [place], [place] [address] [phone] (office)

## 2021-10-02 NOTE — Anesthesia Procedure Notes (Signed)
Procedure Name: Intubation Date/Time: 10/02/2021 7:43 AM  Performed by: Myna Bright, CRNAPre-anesthesia Checklist: Patient identified, Emergency Drugs available, Suction available, Patient being monitored and Timeout performed Patient Re-evaluated:Patient Re-evaluated prior to induction Oxygen Delivery Method: Circle system utilized Preoxygenation: Pre-oxygenation with 100% oxygen Induction Type: Combination inhalational/ intravenous induction Ventilation: Mask ventilation without difficulty Laryngoscope Size: Mac and 3 Tube type: Oral Tube size: 7.5 mm Number of attempts: 1 Airway Equipment and Method: Stylet Placement Confirmation: ETT inserted through vocal cords under direct vision Secured at: 21 cm Tube secured with: Tape Dental Injury: Teeth and Oropharynx as per pre-operative assessment  Comments: Attempt with miller 3 and obtained grade 3 view. Pulled miller blade out and mask ventilated easily, used glidescope with Mac 3 blade for second attempt and tube passed easily.

## 2021-10-02 NOTE — Anesthesia Postprocedure Evaluation (Signed)
Anesthesia Post Note  Patient: Valerie Reilly  Procedure(s) Performed: LAPAROSCOPIC CHOLECYSTECTOMY (Abdomen)  Patient location during evaluation: Phase II Anesthesia Type: General Level of consciousness: awake Pain management: pain level controlled Vital Signs Assessment: post-procedure vital signs reviewed and stable Respiratory status: spontaneous breathing and respiratory function stable Cardiovascular status: blood pressure returned to baseline and stable Postop Assessment: no headache and no apparent nausea or vomiting Anesthetic complications: no Comments: Late entry   No notable events documented.   Last Vitals:  Vitals:   10/02/21 0932 10/02/21 0948  BP: (!) 147/84   Pulse: (!) 57   Resp: 18   Temp: 36.6 C   SpO2: 96% 96%    Last Pain:  Vitals:   10/02/21 0932  TempSrc: Oral  PainSc: [place]

## 2021-10-02 NOTE — Op Note (Signed)
Operative Note   Preoperative Diagnosis: Symptomatic cholelithiasis   Postoperative Diagnosis: Same   Procedure(s) Performed: Laparoscopic cholecystectomy   Surgeon: Graciella Freer, DO    Assistants: Aviva Signs, MD   Anesthesia: General endotracheal   Anesthesiologist: Louann Sjogren, MD    Specimens: Gallbladder    Estimated Blood Loss: Minimal    Blood Replacement: None    Complications: None    Indications: Patient is a 46 year old female who presents for laparoscopic cholecystectomy.  She has been having right flank pain for many years.  She underwent a CT abdomen and pelvis which demonstrated no evidence of nephrolithiasis.  She underwent abdominal ultrasound which did demonstrate cholelithiasis.  She is agreeable to laparoscopic cholecystectomy at this time.  All risks, benefits, and alternatives to laparoscopic cholecystectomy were discussed with the patient, all of her questions were answered to her expressed satisfaction. The patient expresses she wishes to proceed, and informed consent was obtained.  Operative Findings: Distended gallbladder, minimally inflamed   Procedure: The patient was taken to the operating room and placed supine. General endotracheal anesthesia was induced. Intravenous antibiotics were administered per protocol. An orogastric tube positioned to decompress the stomach. The abdomen was prepared and draped in the usual sterile fashion.    A supraumbilical incision was made and a Veress technique was utilized to achieve pneumoperitoneum to 15 mmHg with carbon dioxide. A 11 mm optiview port was placed through the supraumbilical region, and a 10 mm 0-degree operative laparoscope was introduced. The area underlying the trocar and Veress needle were inspected and without evidence of injury.  Remaining trocars were placed under direct vision. Two 5 mm ports were placed in the right abdomen, between the anterior axillary and midclavicular line.  A final  11 mm port was placed through the mid-epigastrium, near the falciform ligament.    The gallbladder fundus was elevated cephalad and the infundibulum was retracted to the patient's right. The gallbladder/cystic duct junction was skeletonized. The cystic artery noted in the triangle of Calot and was also skeletonized.  We then continued liberal medial and lateral dissection until the critical view of safety was achieved.    The cystic duct and cystic artery were doubly clipped and divided. The gallbladder was then dissected from the liver bed with electrocautery. The specimen was placed in an Endopouch and was retrieved through the epigastric site.   Final inspection revealed acceptable hemostasis. Surgical SNOW was placed in the gallbladder bed.  Trocars were removed and pneumoperitoneum was released.  0 Vicryl fascial sutures were used to close the epigastric and umbilical port sites. Skin incisions were closed with 4-0 Monocryl subcuticular sutures and Dermabond. The patient was awakened from anesthesia and extubated without complication.    Graciella Freer, DO  [facility] [address] [place], [place] [address] [phone] (office)

## 2021-10-02 NOTE — Anesthesia Preprocedure Evaluation (Signed)
Anesthesia Evaluation  Patient identified by MRN, date of birth, ID band Patient awake    Reviewed: Allergy & Precautions, H&P , NPO status , Patient's Chart, lab work & pertinent test results, reviewed documented beta Capri date and time   Airway Mallampati: II  TM Distance: >3 FB Neck ROM: full    Dental no notable dental hx.    Pulmonary asthma , Current Smoker,    Pulmonary exam normal breath sounds clear to auscultation       Cardiovascular Exercise Tolerance: Good hypertension, negative cardio ROS   Rhythm:regular Rate:Normal     Neuro/Psych PSYCHIATRIC DISORDERS Anxiety Bipolar Disorder negative neurological ROS     GI/Hepatic Neg liver ROS, GERD  Medicated,  Endo/Other  negative endocrine ROSdiabetes, Type 2  Renal/GU negative Renal ROS  negative genitourinary   Musculoskeletal   Abdominal   Peds  Hematology negative hematology ROS (+)   Anesthesia Other Findings   Reproductive/Obstetrics negative OB ROS                             Anesthesia Physical Anesthesia Plan  ASA: 2  Anesthesia Plan: General and General ETT   Post-op Pain Management:    Induction:   PONV Risk Score and Plan: Ondansetron and Scopolamine patch - Pre-op  Airway Management Planned:   Additional Equipment:   Intra-op Plan:   Post-operative Plan:   Informed Consent: I have reviewed the patients History and Physical, chart, labs and discussed the procedure including the risks, benefits and alternatives for the proposed anesthesia with the patient or authorized representative who has indicated his/her understanding and acceptance.     Dental Advisory Given  Plan Discussed with: CRNA  Anesthesia Plan Comments:         Anesthesia Quick Evaluation

## 2021-10-02 NOTE — Transfer of Care (Signed)
Immediate Anesthesia Transfer of Care Note  Patient: Kjersten Ormiston Kadrmas  Procedure(s) Performed: LAPAROSCOPIC CHOLECYSTECTOMY (Abdomen)  Patient Location: PACU  Anesthesia Type:General  Level of Consciousness: awake, alert , oriented and patient cooperative  Airway & Oxygen Therapy: Patient Spontanous Breathing  Post-op Assessment: Report given to RN, Post -op Vital signs reviewed and stable and Patient moving all extremities  Post vital signs: Reviewed and stable  Last Vitals:  Vitals Value Taken Time  BP 150/84 10/02/21 0900  Temp 97.7 10/02/21 0903  Pulse 87 10/02/21 0902  Resp 20 10/02/21 0902  SpO2 93 % 10/02/21 0902  Vitals shown include unvalidated device data.  Last Pain:  Vitals:   10/02/21 0629  TempSrc: Oral  PainSc: 3       Patients Stated Pain Goal: 6 (49/97/18 2099)  Complications: No notable events documented.

## 2021-10-02 NOTE — Progress Notes (Signed)
Update note:  Spoke with patient's mother in the consultation room.  I explained that she tolerated the procedure without difficulty, and we were able to remove her gallbladder.  She has dissolvable stitches in her laparoscopic incisions, and overlying skin glue.  The skin glue will flake off in 10 to 14 days.  I given her a prescription for narcotic pain medication, which she should take as needed for pain.  If she is taking the narcotic pain medication, she should take a stool softener to decrease risk of constipation.  I also recommend that she take scheduled Tylenol for the next 5 to 7 days.  All questions were answered to her expressed satisfaction.  Graciella Freer, DO [facility] [address] [place], [place] [address] [phone] (office)

## 2021-10-02 NOTE — Discharge Instructions (Signed)
Ambulatory Surgery Discharge Instructions  General Anesthesia or Sedation Do not drive or operate heavy machinery for 24 hours.  Do not consume alcohol, tranquilizers, sleeping medications, or any non-prescribed medications for 24 hours. Do not make important decisions or sign any important papers in the next 24 hours. You should have someone with you tonight at home.  Activity  You are advised to go directly home from the hospital.  Restrict your activities and rest for a day.  Resume light activity tomorrow. No heavy lifting over 10 lbs or strenuous exercise.  Fluids and Diet Begin with clear liquids, bouillon, dry toast, soda crackers.  If not nauseated, you may go to a regular diet when you desire.  Greasy and spicy foods are not advised.  Medications  If you have not had a bowel movement in 24 hours, take 2 tablespoons over the counter Milk of mag.             You May resume your blood thinners tomorrow (Aspirin, coumadin, or other).  You are being discharged with prescriptions for Opioid/Narcotic Medications: There are some specific considerations for these medications that you should know. Opioid Meds have risks & benefits. Addiction to these meds is always a concern with prolonged use Take medication only as directed Do not drive while taking narcotic pain medication Do not crush tablets or capsules Do not use a different container than medication was dispensed in Lock the container of medication in a cool, dry place out of reach of children and pets. Opioid medication can cause addiction Do not share with anyone else (this is a felony) Do not store medications for future use. Dispose of them properly.     Disposal:  Find a [place] household drug take back site near you.  If you can't get to a drug take back site, use the recipe below as a last resort to dispose of expired, unused or unwanted drugs. Disposal  (Do not dispose chemotherapy drugs this way, talk to your  prescribing doctor instead.) Step 1: Mix drugs (do not crush) with dirt, kitty litter, or used coffee grounds and add a small amount of water to dissolve any solid medications. Step 2: Seal drugs in plastic bag. Step 3: Place plastic bag in trash. Step 4: Take prescription container and scratch out personal information, then recycle or throw away.  Operative Site  You have a liquid bandage over your incisions, this will begin to flake off in about a week. Ok to shower tomorrow. Keep wound clean and dry. No baths or swimming. No lifting more than 10 pounds.  Contact Information: If you have questions or concerns, please call our office, [phone], Monday- Thursday 8AM-5PM and Friday 8AM-12Noon.  If it is after hours or on the weekend, please call [facility] Main Number, [phone], and ask to speak to the surgeon on call for Dr. Mikie Misner at [place].   SPECIFIC COMPLICATIONS TO WATCH FOR: Inability to urinate Fever over 101? F by mouth Nausea and vomiting lasting longer than 24 hours. Pain not relieved by medication ordered Swelling around the operative site Increased redness, warmth, hardness, around operative area Numbness, tingling, or cold fingers or toes Blood -soaked dressing, (small amounts of oozing may be normal) Increasing and progressive drainage from surgical area or exam site  

## 2021-10-05 ENCOUNTER — Encounter (HOSPITAL_COMMUNITY): Payer: Self-pay | Admitting: Surgery

## 2021-10-05 LAB — SURGICAL PATHOLOGY

## 2021-10-12 ENCOUNTER — Other Ambulatory Visit: Payer: Self-pay

## 2021-10-13 ENCOUNTER — Other Ambulatory Visit (INDEPENDENT_AMBULATORY_CARE_PROVIDER_SITE_OTHER): Payer: Self-pay

## 2021-10-13 ENCOUNTER — Encounter: Payer: Self-pay | Admitting: Physician Assistant

## 2021-10-13 ENCOUNTER — Ambulatory Visit (INDEPENDENT_AMBULATORY_CARE_PROVIDER_SITE_OTHER): Payer: Self-pay | Admitting: Physician Assistant

## 2021-10-13 VITALS — BP 148/82 | HR 70 | Ht 67.0 in | Wt 227.0 lb

## 2021-10-13 DIAGNOSIS — R160 Hepatomegaly, not elsewhere classified: Secondary | ICD-10-CM

## 2021-10-13 LAB — COMPREHENSIVE METABOLIC PANEL
ALT: 19 U/L (ref 0–35)
AST: 12 U/L (ref 0–37)
Albumin: 4.5 g/dL (ref 3.5–5.2)
Alkaline Phosphatase: 63 U/L (ref 39–117)
BUN: 11 mg/dL (ref 6–23)
CO2: 25 mEq/L (ref 19–32)
Calcium: 10.4 mg/dL (ref 8.4–10.5)
Chloride: 101 mEq/L (ref 96–112)
Creatinine, Ser: 0.66 mg/dL (ref 0.40–1.20)
GFR: 104.06 mL/min (ref >60.00)
Glucose, Bld: 174 mg/dL — ABNORMAL HIGH (ref 70–99)
Potassium: 4.2 mEq/L (ref 3.5–5.1)
Sodium: 136 mEq/L (ref 135–145)
Total Bilirubin: 0.4 mg/dL (ref 0.2–1.2)
Total Protein: 7.4 g/dL (ref 6.0–8.3)

## 2021-10-13 LAB — CBC WITH DIFFERENTIAL/PLATELET
Basophils Absolute: 0.1 10*3/uL (ref 0.0–0.1)
Basophils Relative: 0.8 % (ref 0.0–3.0)
Eosinophils Absolute: 0.5 10*3/uL (ref 0.0–0.7)
Eosinophils Relative: 3.8 % (ref 0.0–5.0)
HCT: 43.7 % (ref 36.0–46.0)
Hemoglobin: 14.8 g/dL (ref 12.0–15.0)
Lymphocytes Relative: 25.4 % (ref 12.0–46.0)
Lymphs Abs: 3 10*3/uL (ref 0.7–4.0)
MCHC: 33.8 g/dL (ref 30.0–36.0)
MCV: 94 fl (ref 78.0–100.0)
Monocytes Absolute: 0.8 10*3/uL (ref 0.1–1.0)
Monocytes Relative: 7.1 % (ref 3.0–12.0)
Neutro Abs: 7.5 10*3/uL (ref 1.4–7.7)
Neutrophils Relative %: 62.9 % (ref 43.0–77.0)
Platelets: 316 10*3/uL (ref 150.0–400.0)
RBC: 4.65 Mil/uL (ref 3.87–5.11)
RDW: 13.7 % (ref 11.5–15.5)
WBC: 11.9 10*3/uL — ABNORMAL HIGH (ref 4.0–10.5)

## 2021-10-13 LAB — PROTIME-INR
INR: 1 ratio (ref 0.8–1.0)
Prothrombin Time: 10.6 s (ref 9.6–13.1)

## 2021-10-13 NOTE — Progress Notes (Signed)
Subjective:    Patient ID: Valerie Reilly, female    DOB: 28-Nov-1975, 46 y.o.   MRN: [id number]  [name] is a 46 year old white female, new to GI today referred by Geryl Rankins, NP with concerns regarding hepatomegaly noted on recent CT. Patient has not had any prior GI evaluation.  She did have the recent imaging for complaints of right-sided abdominal pain and was found on CT abdomen and pelvis without contrast to have an enlarged liver measuring 25.8 cm in length, no focal hepatic lesions noted, gallstones with no gallbladder wall thickening or surrounding edema and no ductal dilation, normal spleen, no ascites, no enlarged abdominal or pelvic lymph nodes. She was subsequently referred to surgery and had undergone a laparoscopic cholecystectomy on 09/27/2021 at [facility] per Dr. Curt Bears Pappayliou. There is no mention in the operative note about the liver.  Patient feels that she is recovering well from surgery, though still is a little sore. She not had any recent liver tests. Patient says that she had not ever been told in the past of any liver issues, she does not use alcohol on any regular basis, no prior history of hepatitis, no family history of liver disease that she is aware of. She does have history of anxiety disorder, adult onset diabetes mellitus, BMI 35/obesity, bipolar disorder and borderline personality.  She has no current complaints of abdominal pain or discomfort.   Review of Systems Pertinent positive and negative review of systems were noted in the above HPI section.  All other review of systems was otherwise negative.   Outpatient Encounter Medications as of 10/13/2021  Medication Sig   benazepril (LOTENSIN) 20 MG tablet Take 1 tablet by mouth once daily   busPIRone (BUSPAR) 30 MG tablet TAKE 1 TABLET (30 MG TOTAL) BY MOUTH 2 (TWO) TIMES DAILY.   cholecalciferol (VITAMIN D) 1000 UNITS tablet Take 1,000 Units by mouth daily.   famotidine (PEPCID) 20 MG tablet  Take 20 mg by mouth daily.   fexofenadine (ALLEGRA) 180 MG tablet Take 180 mg by mouth daily.   glipiZIDE (GLUCOTROL XL) 10 MG 24 hr tablet Take 1 tablet (10 mg total) by mouth daily with breakfast.   Guselkumab (TREMFYA) 100 MG/ML SOSY inject $RemoveBef'100mg'yBtHCqRmdT$ /76ml into the skin a week 0 and 4 and then 8 weeks thereafter   hydroxypropyl methylcellulose / hypromellose (ISOPTO TEARS / GONIOVISC) 2.5 % ophthalmic solution Place 1 drop into both eyes in the morning and at bedtime.   levothyroxine (SYNTHROID) 50 MCG tablet Take 1 tablet (50 mcg total) by mouth daily before breakfast.   lithium carbonate 300 MG capsule Take 2 capsules by mouth 2 (two) times daily.   meloxicam (MOBIC) 15 MG tablet Take 1 tablet (15 mg total) by mouth daily. Prn pain   Multiple Vitamin (MULTIVITAMIN) capsule Take 1 capsule by mouth daily.   Probiotic Product (PROBIOTIC BLEND PO) Take 1 capsule by mouth daily.   simvastatin (ZOCOR) 40 MG tablet Take 1 tablet by mouth once daily   traZODone (DESYREL) 150 MG tablet TAKE 2 & 1/2 TABLETS BY MOUTH AT BEDTIME   docusate sodium (COLACE) 100 MG capsule Take 1 capsule (100 mg total) by mouth 2 (two) times daily. (Patient not taking: Reported on 10/13/2021)   fluticasone (FLONASE) 50 MCG/ACT nasal spray PLACE 2 SPRAYS INTO BOTH NOSTRILS DAILY.   metFORMIN (GLUCOPHAGE) 1000 MG tablet Take 1 tablet (1,000 mg total) by mouth 2 (two) times daily with a meal.   oxyCODONE (ROXICODONE) 5 MG  immediate release tablet Take 1 tablet (5 mg total) by mouth every 6 (six) hours as needed for severe pain. (Patient not taking: Reported on 10/13/2021)   No facility-administered encounter medications on file as of 10/13/2021.   No Known Allergies Patient Active Problem List   Diagnosis Date Noted   Calculus of gallbladder without cholecystitis without obstruction    Bipolar I disorder ([place]) 09/26/2019   GAD (generalized anxiety disorder) 09/26/2019   Insomnia due to other mental disorder 09/26/2019    Leukocytosis 04/06/2017   Cannabis abuse, in remission 08/15/2014   Bipolar I disorder, most recent episode (or current) unspecified 04/30/2014   Borderline personality disorder ([place]) 04/30/2014   Social History   Socioeconomic History   Marital status: Divorced    Spouse name: Not on file   Number of children: 1   Years of education: Not on file   Highest education level: Associate degree: occupational, Hotel manager, or vocational program  Occupational History   Occupation: delivery newspaper  Tobacco Use   Smoking status: Every Day    Packs/day: 1.00    Types: Cigarettes, E-cigarettes   Smokeless tobacco: Never  Vaping Use   Vaping Use: Never used  Substance and Sexual Activity   Alcohol use: Yes    Alcohol/week: 0.0 standard drinks of alcohol    Comment: occasional- a few times a year. 6-7 drinks per episdoe   Drug use: No    Types: Marijuana    Comment: last time was 3 yrs ago   Sexual activity: Yes    Birth control/protection: Surgical  Other Topics Concern   Not on file  Social History Narrative   Not on file   Social Determinants of Health   Financial Resource Strain: Not on file  Food Insecurity: No Food Insecurity (04/07/2021)   Hunger Vital Sign    Worried About Running Out of Food in the Last Year: Never true    Ran Out of Food in the Last Year: Never true  Transportation Needs: No Transportation Needs (04/07/2021)   PRAPARE - Hydrologist (Medical): No    Lack of Transportation (Non-Medical): No  Physical Activity: Not on file  Stress: Not on file  Social Connections: Not on file  Intimate Partner Violence: Not on file    Ms. Balles's family history includes Alcohol abuse in her maternal uncle; Depression in her maternal uncle; Diabetes in her father, maternal grandfather, and mother; Personality disorder in her mother.      Objective:    Vitals:   10/13/21 0821  BP: (!) 148/82  Pulse: 70    Physical Exam  Well-developed well-nourished  older WF  in no acute distress.  Height, Weight,227 BMI 35.5  HEENT; nontraumatic normocephalic, EOMI, PE R LA, sclera anicteric. Oropharynx; not examined today Neck; supple, no JVD Cardiovascular; regular rate and rhythm with S1-S2, no murmur rub or gallop Pulmonary; Clear bilaterally Abdomen; soft, obese, nontender, nondistended, no palpable mass or hepatosplenomegaly, bowel sounds are active.  Healing incisional ports from very recent laparoscopic cholecystectomy Rectal; not done today Skin; benign exam, no jaundice rash or appreciable lesions Extremities; no clubbing cyanosis or edema skin warm and dry Neuro/Psych; Reilly and oriented x4, grossly nonfocal mood and affect appropriate        Assessment & Plan:   #10 46 year old white female with incidental hepatomegaly noted on noncontrast CT. Etiology not certain  #2 status post very recent laparoscopic cholecystectomy 09/27/2021 for symptomatic cholelithiasis #3 adult onset diabetes mellitus #4.  Obesity-BMI 35 #5 .  Anxiety disorder/bipolar disorder/borderline personality  #6 colon cancer screening-no prior colonoscopy/average risk no family history  Plan; Check complete upper abdominal ultrasound to better assess volume of liver C-Met/CBC/INR Note prior hep C serology negative. We discussed colon cancer screening, she is agreeable to colonoscopy.  Will await results of above workup, and allow her a bit of time to recover from recent laparoscopic cholecystectomy, then we will get her scheduled for colonoscopy with Dr. Rush Landmark . Procedure was discussed in detail with her today including indications risk and benefits.  Machael Raine Genia Harold PA-C 10/13/2021   Cc: Gildardo Pounds, NP

## 2021-10-13 NOTE — Patient Instructions (Signed)
If you are age 48 or younger, your body mass index should be between 19-25. Your Body mass index is 35.55 kg/m. If this is out of the aformentioned range listed, please consider follow up with your Primary Care Provider.  ________________________________________________________  The [facility] providers would like to encourage you to use [facility] to communicate with providers for non-urgent requests or questions.  Due to long hold times on the telephone, sending your provider a message by [facility] may be a faster and more efficient way to get a response.  Please allow 48 business hours for a response.  Please remember that this is for non-urgent requests.  _______________________________________________________  Your provider has requested that you go to the basement level for lab work before leaving today. Press "B" on the elevator. The lab is located at the first door on the left as you exit the elevator.  You have been scheduled for an abdominal ultrasound at [facility] (1st floor of hospital) on 10/16/2021 at 8:00 am. Please arrive 15 minutes prior to your appointment for registration. Make certain not to have anything to eat or drink 6 hours prior to your appointment. Should you need to reschedule your appointment, please contact radiology at [phone]. This test typically takes about 30 minutes to perform.  Follow up pending at this time.  Thank you for entrusting me with your care and choosing [facility].  Amy Esterwood, PA-C

## 2021-10-14 NOTE — Progress Notes (Signed)
Attending Physician's Attestation   I have reviewed the chart.   I agree with the Advanced Practitioner's note, impression, and recommendations with any updates as below. Reasonable to monitor and follow-up repeat imaging.  With normal LFTs less likely to require long-term follow-up as she also has a normal spleen size.  If she were to develop thrombocytopenia or abnormal LFTs in the future then query the role of further workup.  Will let her heal up from recent cholecystectomy before colon cancer screening.   Justice Britain, MD [facility] Office # [phone]

## 2021-10-17 ENCOUNTER — Other Ambulatory Visit: Payer: Self-pay | Admitting: Nurse Practitioner

## 2021-10-17 DIAGNOSIS — I10 Essential (primary) hypertension: Secondary | ICD-10-CM

## 2021-10-19 NOTE — Telephone Encounter (Signed)
Requested Prescriptions  Pending Prescriptions Disp Refills  . benazepril (LOTENSIN) 20 MG tablet [Pharmacy Med Name: Benazepril HCl 20 MG Oral Tablet] 90 tablet 0    Sig: Take 1 tablet by mouth once daily     Cardiovascular:  ACE Inhibitors Failed - 10/17/2021 11:01 AM      Failed - Last BP in normal range    BP Readings from Last 1 Encounters:  10/13/21 (!) 148/82         Passed - Cr in normal range and within 180 days    Creatinine  Date Value Ref Range Status  02/02/2021 0.73 0.44 - 1.00 mg/dL Final   Creatinine, Ser  Date Value Ref Range Status  10/13/2021 0.66 0.40 - 1.20 mg/dL Final         Passed - K in normal range and within 180 days    Potassium  Date Value Ref Range Status  10/13/2021 4.2 3.5 - 5.1 mEq/L Final         Passed - Patient is not pregnant      Passed - Valid encounter within last 6 months    Recent Outpatient Visits          2 months ago Type 2 diabetes mellitus without complication, with long-term current use of insulin [facility])   [place], [place], [place]   6 months ago Type 2 diabetes mellitus without complication, with long-term current use of insulin [facility])   [place], Vernia Buff, NP   9 months ago Leukocytosis, unspecified type   [place], Vernia Buff, NP   9 months ago Primary hypertension   [place], Vernia Buff, NP   10 months ago Upper respiratory infection, viral   [place], Vernia Buff, NP      Future Appointments            In 3 weeks Gildardo Pounds, NP [place]

## 2021-10-20 ENCOUNTER — Ambulatory Visit (INDEPENDENT_AMBULATORY_CARE_PROVIDER_SITE_OTHER): Payer: Self-pay | Admitting: Surgery

## 2021-10-20 DIAGNOSIS — Z09 Encounter for follow-up examination after completed treatment for conditions other than malignant neoplasm: Secondary | ICD-10-CM

## 2021-10-20 NOTE — Progress Notes (Signed)
[facility]  I am calling the patient for post operative evaluation. This is not a billable encounter as it is under the [place] charges for the surgery.  The patient had a laparoscopic cholecystomy on 7/28. The patient reports that she is doing well. She is tolerating a diet, having good pain control, and having regular Bms.  The incisions are healing well with a little bit of skin glue in place.  I advised her that she can use antibiotic ointment to help get the glue off. The patient has no concerns.  Advised her to wait an additional 2 weeks prior to swimming.  Pathology: A. GALLBLADDER, CHOLECYSTECTOMY:  - Chronic cholecystitis and cholelithiasis  - Benign lymph node   Will see the patient PRN.   Graciella Freer, DO [facility] [address] [place], [place] [address] [phone] (office)

## 2021-10-23 ENCOUNTER — Ambulatory Visit (HOSPITAL_COMMUNITY)
Admission: RE | Admit: 2021-10-23 | Discharge: 2021-10-23 | Disposition: A | Discharge status: Home / Self Care | Payer: Self-pay | Source: Ambulatory Visit | Attending: Physician Assistant | Admitting: Physician Assistant

## 2021-10-23 DIAGNOSIS — R160 Hepatomegaly, not elsewhere classified: Secondary | ICD-10-CM | POA: Insufficient documentation

## 2021-10-26 ENCOUNTER — Telehealth: Payer: Self-pay

## 2021-10-26 ENCOUNTER — Encounter: Payer: Self-pay | Admitting: Gastroenterology

## 2021-10-26 ENCOUNTER — Other Ambulatory Visit: Payer: Self-pay

## 2021-10-26 DIAGNOSIS — R9389 Abnormal findings on diagnostic imaging of other specified body structures: Secondary | ICD-10-CM

## 2021-10-26 NOTE — Telephone Encounter (Signed)
Patient is unable to schedule the nurse visit and colonoscopy appointment at this time. She will call back when she has a care partner. She needs a nurse visit and a colonoscopy appointment with Dr Rush Landmark scheduled. Dx screening colonoscopy.

## 2021-11-02 ENCOUNTER — Other Ambulatory Visit: Payer: Self-pay

## 2021-11-04 ENCOUNTER — Other Ambulatory Visit: Payer: Self-pay

## 2021-11-04 ENCOUNTER — Other Ambulatory Visit: Payer: Self-pay | Admitting: Nurse Practitioner

## 2021-11-04 DIAGNOSIS — E039 Hypothyroidism, unspecified: Secondary | ICD-10-CM

## 2021-11-04 NOTE — Telephone Encounter (Unsigned)
Copied from [place] [id number]. Topic: General - Other >> Nov 04, 2021  1:34 PM Everette C wrote: Reason for CRM: Medication Refill - Medication: levothyroxine (SYNTHROID) 50 MCG tablet [[id number]]   Has the patient contacted their pharmacy? Yes.  The patient has been directed to contact their PCP  (Agent: If no, request that the patient contact the pharmacy for the refill. If patient does not wish to contact the pharmacy document the reason why and proceed with request.) (Agent: If yes, when and what did the pharmacy advise?)  Preferred Pharmacy (with phone number or street name): [place], [place] [address] Phone: [phone] Fax: [phone] Hours: Not open 24 hours   Has the patient been seen for an appointment in the last year OR does the patient have an upcoming appointment? Yes.    Agent: Please be advised that RX refills may take up to 3 business days. We ask that you follow-up with your pharmacy.

## 2021-11-05 ENCOUNTER — Telehealth (INDEPENDENT_AMBULATORY_CARE_PROVIDER_SITE_OTHER): Payer: No Payment, Other | Admitting: Physician Assistant

## 2021-11-05 ENCOUNTER — Ambulatory Visit (AMBULATORY_SURGERY_CENTER): Payer: Self-pay | Admitting: *Deleted

## 2021-11-05 ENCOUNTER — Ambulatory Visit (HOSPITAL_COMMUNITY)
Admission: RE | Admit: 2021-11-05 | Discharge: 2021-11-05 | Disposition: A | Discharge status: Home / Self Care | Payer: Self-pay | Source: Ambulatory Visit | Attending: Physician Assistant | Admitting: Physician Assistant

## 2021-11-05 ENCOUNTER — Other Ambulatory Visit: Payer: Self-pay

## 2021-11-05 VITALS — Ht 67.0 in | Wt 232.0 lb

## 2021-11-05 DIAGNOSIS — Z1211 Encounter for screening for malignant neoplasm of colon: Secondary | ICD-10-CM

## 2021-11-05 DIAGNOSIS — F5105 Insomnia due to other mental disorder: Secondary | ICD-10-CM

## 2021-11-05 DIAGNOSIS — F411 Generalized anxiety disorder: Secondary | ICD-10-CM | POA: Diagnosis not present

## 2021-11-05 DIAGNOSIS — F99 Mental disorder, not otherwise specified: Secondary | ICD-10-CM

## 2021-11-05 DIAGNOSIS — F319 Bipolar disorder, unspecified: Secondary | ICD-10-CM | POA: Diagnosis not present

## 2021-11-05 DIAGNOSIS — R9389 Abnormal findings on diagnostic imaging of other specified body structures: Secondary | ICD-10-CM | POA: Insufficient documentation

## 2021-11-05 MED ORDER — LITHIUM CARBONATE 300 MG PO CAPS
ORAL_CAPSULE | Freq: Two times a day (BID) | ORAL | 3 refills | Status: DC
  Filled 2021-12-28: qty 120, 30d supply, fill #0
  Filled 2022-02-05: qty 120, 30d supply, fill #1

## 2021-11-05 MED ORDER — LEVOTHYROXINE SODIUM 50 MCG PO TABS: 50.0000 ug | ORAL_TABLET | Freq: Every day | ORAL | 0 refills | Status: DC

## 2021-11-05 MED ORDER — TRAZODONE HCL 150 MG PO TABS
ORAL_TABLET | ORAL | 3 refills | Status: DC
  Filled 2021-12-28: qty 75, 30d supply, fill #0
  Filled 2022-02-05: qty 75, 30d supply, fill #1

## 2021-11-05 MED ORDER — BUSPIRONE HCL 30 MG PO TABS
ORAL_TABLET | Freq: Two times a day (BID) | ORAL | 3 refills | Status: AC
  Filled 2021-11-05: qty 180, fill #0
  Filled 2021-12-28: qty 60, 30d supply, fill #0
  Filled 2022-02-05: qty 60, 30d supply, fill #1

## 2021-11-05 MED ORDER — GABAPENTIN 100 MG PO CAPS
100.0000 mg | ORAL_CAPSULE | Freq: Every day | ORAL | 2 refills | Status: DC
  Filled 2021-11-05: qty 30, 30d supply, fill #0

## 2021-11-05 NOTE — Progress Notes (Signed)
No egg or soy allergy known to patient  No issues known to pt with past sedation with any surgeries or procedures Patient denies ever being told they had issues or difficulty with intubation  No FH of Malignant Hyperthermia Pt is not on diet pills Pt is not on  home 02  Pt is not on blood thinners  Pt denies issues with constipation  No A fib or A flutter Have any cardiac testing pending--NO Pt instructed to use Singlecare.com or GoodRx for a price reduction on prep    Sample sheet of over the counter items to purchase for prep given to pt.

## 2021-11-05 NOTE — Telephone Encounter (Signed)
Requested Prescriptions  Pending Prescriptions Disp Refills  . levothyroxine (SYNTHROID) 50 MCG tablet 90 tablet 0    Sig: Take 1 tablet (50 mcg total) by mouth daily before breakfast.     Endocrinology:  Hypothyroid Agents Passed - 11/04/2021  2:40 PM      Passed - TSH in normal range and within 360 days    TSH  Date Value Ref Range Status  04/13/2021 1.300 0.450 - 4.500 uIU/mL Final         Passed - Valid encounter within last 12 months    Recent Outpatient Visits          3 months ago Type 2 diabetes mellitus without complication, with long-term current use of insulin [facility])   [place], [place], [place]   6 months ago Type 2 diabetes mellitus without complication, with long-term current use of insulin [facility])   [place], Vernia Buff, NP   9 months ago Leukocytosis, unspecified type   [place], Vernia Buff, NP   10 months ago Primary hypertension   [place], Vernia Buff, NP   11 months ago Upper respiratory infection, viral   [place], Vernia Buff, NP      Future Appointments            In 6 days Gildardo Pounds, NP [place]

## 2021-11-08 ENCOUNTER — Encounter (HOSPITAL_COMMUNITY): Payer: Self-pay | Admitting: Physician Assistant

## 2021-11-08 NOTE — Progress Notes (Signed)
BH MD/PA/NP OP Progress Note  Virtual Visit via Video Note  I connected with Valerie Reilly on 11/08/21 at 11:30 AM EDT by a video enabled telemedicine application and verified that I am speaking with the correct person using two identifiers.  Location: Patient: Home Provider: Clinic   I discussed the limitations of evaluation and management by telemedicine and the availability of in person appointments. The patient expressed understanding and agreed to proceed.  Follow Up Instructions:  I discussed the assessment and treatment plan with the patient. The patient was provided an opportunity to ask questions and all were answered. The patient agreed with the plan and demonstrated an understanding of the instructions.   The patient was advised to call back or seek an in-person evaluation if the symptoms worsen or if the condition fails to improve as anticipated.  I provided 13 minutes of non-face-to-face time during this encounter.  [name], PA   11/05/2021 12:02 PM Valerie Reilly  MRN:  [id number]  Chief Complaint:  Chief Complaint  Patient presents with   Medication Management   Follow-up   HPI:    Valerie Reilly is a 46 year old female with a past psychiatric history significant for generalized anxiety disorder, bipolar 1 disorder, and insomnia who presents to [facility] via virtual video visit for follow-up and medication management.  Patient is currently being managed on the following medications:  Buspirone 2 times daily Lithium 300 mg 2 times daily Trazodone 150 mg 2-1/2 tablets at bedtime  Since her last encounter, patient recently got her gallbladder removed at the end of July.  Prior to her surgery, a CT scan was performed with results significant for an enlarged liver.  Patient states that she has an upcoming appointment to uncover the reason for her enlarged liver.  Despite her GI issues, patient reports no  issues or concerns regarding her current psychiatric medications.  Patient endorses mild depression but attributes her symptoms to the healing process from her previous surgery.  Patient endorses elevated anxiety and rates her anxiety as 7 or 8 out of 10.  Patient denies any new stressors at this time.  A GAD-7 screen was performed with the patient scoring a 14.  Patient is alert and oriented x4, calm, cooperative, and fully engaged in conversation during the encounter.  Patient endorses being a little agitated due to being sore from her surgery scars.  Patient denies suicidal or homicidal ideations.  She further denies auditory or visual hallucinations and does not appear to be responding to internal/external stimuli.  Patient endorses good sleep and receives on average 8 hours of sleep each night.  Patient endorses good appetite and eats on average 2 meals a day.  She notes that she has been eating less due to her recent surgery.  Patient endorses alcohol consumption sparingly.  Patient endorses tobacco use and smokes on average 20 to 30 cigarettes/day.  Patient denies illicit drug use.  Visit Diagnosis:    ICD-10-CM   1. Bipolar I disorder (HCC)  F31.9 traZODone (DESYREL) 150 MG tablet    lithium carbonate 300 MG capsule    2. Insomnia due to other mental disorder  F51.05 traZODone (DESYREL) 150 MG tablet   F99     3. GAD (generalized anxiety disorder)  F41.1 busPIRone (BUSPAR) 30 MG tablet    gabapentin (NEURONTIN) 100 MG capsule      Past Psychiatric History:  Bipolar disorder Generalized anxiety disorder Insomnia  Past Medical History:  Past Medical History:  Diagnosis Date   Allergy    Anxiety    Asthma    Bipolar disorder ([place])    Diabetes mellitus    Diabetes mellitus, type II ([place])    Gallstone    GERD (gastroesophageal reflux disease)    History of borderline personality disorder    Hypercholesteremia    Hypertension    Obesity    Psoriasis    Psoriasis    Seasonal  allergies    Thyroid disease     Past Surgical History:  Procedure Laterality Date   CHOLECYSTECTOMY N/A 10/02/2021   Procedure: LAPAROSCOPIC CHOLECYSTECTOMY;  Surgeon: Rusty Aus, DO;  Location: [facility];  Service: General;  Laterality: N/A;   TONSILLECTOMY     TUBAL LIGATION      Family Psychiatric History:  Mother - Personality disorder Uncle (maternal) - Alcohol abuse Uncle (maternal) - Depression  Family History:  Family History  Problem Relation Age of Onset   Personality disorder Mother    Diabetes Mother    Diabetes Father    Alcohol abuse Maternal Uncle    Depression Maternal Uncle    Diabetes Maternal Grandfather    Breast cancer Neg Hx    Stomach cancer Neg Hx    Colon cancer Neg Hx    Esophageal cancer Neg Hx    Colon polyps Neg Hx    Crohn's disease Neg Hx    Ulcerative colitis Neg Hx    Rectal cancer Neg Hx     Social History:  Social History   Socioeconomic History   Marital status: Divorced    Spouse name: Not on file   Number of children: 1   Years of education: Not on file   Highest education level: Associate degree: occupational, Hotel manager, or vocational program  Occupational History   Occupation: delivery newspaper  Tobacco Use   Smoking status: Every Day    Packs/day: 1.00    Types: Cigarettes, E-cigarettes    Passive exposure: Current   Smokeless tobacco: Never  Vaping Use   Vaping Use: Never used  Substance and Sexual Activity   Alcohol use: Yes    Alcohol/week: 0.0 standard drinks of alcohol    Comment: occasional- a few times a year. 6-7 drinks per episdoe   Drug use: No    Types: Marijuana    Comment: last time was 3 yrs ago   Sexual activity: Yes    Birth control/protection: Surgical  Other Topics Concern   Not on file  Social History Narrative   Not on file   Social Determinants of Health   Financial Resource Strain: Not on file  Food Insecurity: No Food Insecurity (04/07/2021)   Hunger Vital Sign    Worried  About Running Out of Food in the Last Year: Never true    Ran Out of Food in the Last Year: Never true  Transportation Needs: No Transportation Needs (04/07/2021)   PRAPARE - Hydrologist (Medical): No    Lack of Transportation (Non-Medical): No  Physical Activity: Not on file  Stress: Not on file  Social Connections: Not on file    Allergies: No Known Allergies  Metabolic Disorder Labs: Lab Results  Component Value Date   HGBA1C 6.3 08/05/2021   No results found for: "PROLACTIN" Lab Results  Component Value Date   CHOL 184 01/09/2021   TRIG 183 (H) 01/09/2021   HDL 37 (L) 01/09/2021   CHOLHDL 5.0 (H) 01/09/2021   LDLCALC 115 (  H) 01/09/2021   LDLCALC 91 06/09/2020   Lab Results  Component Value Date   TSH 1.300 04/13/2021   TSH 1.350 04/08/2020    Therapeutic Level Labs: Lab Results  Component Value Date   LITHIUM 0.8 10/13/2020   LITHIUM 0.8 12/04/2019   No results found for: "VALPROATE" No results found for: "CBMZ"  Current Medications: Current Outpatient Medications  Medication Sig Dispense Refill   gabapentin (NEURONTIN) 100 MG capsule Take 1 capsule (100 mg total) by mouth daily. 30 capsule 2   benazepril (LOTENSIN) 20 MG tablet Take 1 tablet by mouth once daily 90 tablet 0   busPIRone (BUSPAR) 30 MG tablet TAKE 1 TABLET (30 MG TOTAL) BY MOUTH 2 (TWO) TIMES DAILY. 180 tablet 3   cholecalciferol (VITAMIN D) 1000 UNITS tablet Take 1,000 Units by mouth daily.     docusate sodium (COLACE) 100 MG capsule Take 1 capsule (100 mg total) by mouth 2 (two) times daily. (Patient not taking: Reported on 10/13/2021) 60 capsule 2   famotidine (PEPCID) 20 MG tablet Take 20 mg by mouth daily.     fexofenadine (ALLEGRA) 180 MG tablet Take 180 mg by mouth daily.     fluticasone (FLONASE) 50 MCG/ACT nasal spray PLACE 2 SPRAYS INTO BOTH NOSTRILS DAILY. 16 g 1   fluticasone (FLONASE) 50 MCG/ACT nasal spray Place 2 sprays into both nostrils daily.      glipiZIDE (GLUCOTROL XL) 10 MG 24 hr tablet Take 1 tablet (10 mg total) by mouth daily with breakfast. 90 tablet 1   Guselkumab (TREMFYA) 100 MG/ML SOSY Inject 1 syringe ('100mg'$ /47m) into the skin at week 0 and 4 and then 8 weeks thereafter 1 mL 11   hydroxypropyl methylcellulose / hypromellose (ISOPTO TEARS / GONIOVISC) 2.5 % ophthalmic solution Place 1 drop into both eyes in the morning and at bedtime.     levothyroxine (SYNTHROID) 50 MCG tablet Take 1 tablet (50 mcg total) by mouth daily before breakfast. 90 tablet 0   lithium carbonate 300 MG capsule Take 2 capsules by mouth 2 (two) times daily. 360 capsule 3   meloxicam (MOBIC) 15 MG tablet Take 1 tablet (15 mg total) by mouth daily. Prn pain (Patient not taking: Reported on 11/05/2021) 30 tablet 2   metFORMIN (GLUCOPHAGE) 1000 MG tablet Take 1 tablet (1,000 mg total) by mouth 2 (two) times daily with a meal. 180 tablet 1   metFORMIN (GLUCOPHAGE) 1000 MG tablet Take 1,000 mg by mouth 2 (two) times daily with a meal.     Multiple Vitamin (MULTIVITAMIN) capsule Take 1 capsule by mouth daily.     oxyCODONE (ROXICODONE) 5 MG immediate release tablet Take 1 tablet (5 mg total) by mouth every 6 (six) hours as needed for severe pain. (Patient not taking: Reported on 10/13/2021) 20 tablet 0   Probiotic Product (PROBIOTIC BLEND PO) Take 1 capsule by mouth daily.     simvastatin (ZOCOR) 40 MG tablet Take 1 tablet by mouth once daily 90 tablet 0   traZODone (DESYREL) 150 MG tablet TAKE 2 & 1/2 TABLETS BY MOUTH AT BEDTIME 225 tablet 3   No current facility-administered medications for this visit.     Musculoskeletal: Strength & Muscle Tone: Unable to assess due to telemedicine visit  GMuncy Unable to assess due to telemedicine visit  Patient leans: Unable to assess due to telemedicine visit   Psychiatric Specialty Exam: Review of Systems  Psychiatric/Behavioral:  Negative for decreased concentration, dysphoric mood, hallucinations,  self-injury, sleep disturbance and  suicidal ideas. The patient is nervous/anxious. The patient is not hyperactive.     There were no vitals taken for this visit.There is no height or weight on file to calculate BMI.  General Appearance: Unable to assess due to telemedicine visit   Eye Contact:  Unable to assess due to telemedicine visit   Speech:  Clear and Coherent and Normal Rate  Volume:  Normal  Mood:  Anxious and Euthymic  Affect:  Appropriate and Congruent  Thought Process:  Coherent and Descriptions of Associations: Intact  Orientation:  Full (Time, Place, and Person)  Thought Content: WDL   Suicidal Thoughts:  No  Homicidal Thoughts:  No  Memory:  Immediate;   Good Recent;   Good Remote;   Good  Judgement:  Good  Insight:  Fair  Psychomotor Activity:  Normal  Concentration:  Concentration: Good and Attention Span: Good  Recall:  Good  Fund of Knowledge: Good  Language: Good  Akathisia:  Negative  Handed:  Right  AIMS (if indicated): not done  Assets:  Communication Skills Desire for Improvement Financial Resources/Insurance Housing  ADL's:  Intact  Cognition: WNL  Sleep:  Good   Screenings: GAD-7    Flowsheet Row Video Visit from 11/05/2021 in [facility] Office Visit from 08/05/2021 in [place] Video Visit from 06/12/2021 in [facility] Video Visit from 02/13/2021 in [facility] Video Visit from 11/14/2020 in [facility]  Total GAD-7 Score 14 0 '17 2 1      '$ PHQ2-9    Flowsheet Row Video Visit from 11/05/2021 in [facility] Office Visit from 08/05/2021 in [place] Video Visit from 06/12/2021 in [facility] Video Visit from 02/13/2021 in [facility] Video Visit from 11/14/2020 in [facility]  PHQ-2 Total Score 0 0 3 0 0  PHQ-9 Total Score -- -- 6 -- --      Flowsheet Row Video Visit from 11/05/2021 in [place] [facility] from 09/29/2021 in [place] Video Visit from 06/12/2021 in [place] No Risk No Risk Low Risk        Assessment and Plan:   Valerie Reilly is a 46 year old female with a past psychiatric history significant for generalized anxiety disorder, bipolar 1 disorder, and insomnia who presents to [facility] via virtual video visit for follow-up and medication management.  Patient endorses mild depression but attributes her symptoms to the recent surgery she underwent.  Patient also endorses elevated anxiety but denies any new stressors.  Patient was recommended gabapentin 100 mg daily for the management of her anxiety.  Patient was agreeable to recommendation.  Patient's medications to be e-prescribed to pharmacy of choice.  Collaboration of Care: Collaboration of Care: Medication Management AEB provider managing patient's psychiatric medications, Primary Care Provider AEB patient being seen by a primary care provider, and Psychiatrist AEB patient being followed by a mental health provider  Patient/Guardian was advised Release of Information must be obtained prior to any record release in order to collaborate their care with an outside provider. Patient/Guardian was advised if they have not already done so to contact the registration department to sign all necessary forms in order for [place] to release information regarding their care.   Consent:  Patient/Guardian gives verbal consent for treatment and assignment of benefits for services provided during this visit. Patient/Guardian expressed understanding and agreed to proceed.   1. Bipolar I disorder ([place])  - traZODone (DESYREL) 150 MG tablet; TAKE 2 & 1/2  TABLETS BY MOUTH AT BEDTIME  Dispense: 225 tablet; Refill: 3 - lithium carbonate 300 MG capsule; Take 2 capsules by mouth 2 (two) times daily.  Dispense: 360 capsule; Refill: 3  2. Insomnia due to other mental disorder  - traZODone (DESYREL) 150 MG tablet; TAKE 2 & 1/2 TABLETS BY MOUTH AT BEDTIME  Dispense: 225 tablet; Refill: 3  3. GAD (generalized anxiety disorder)  - busPIRone (BUSPAR) 30 MG tablet; TAKE 1 TABLET (30 MG TOTAL) BY MOUTH 2 (TWO) TIMES DAILY.  Dispense: 180 tablet; Refill: 3 - gabapentin (NEURONTIN) 100 MG capsule; Take 1 capsule (100 mg total) by mouth daily.  Dispense: 30 capsule; Refill: 2  Patient to follow-up in 2 months Provider spent a total of 13 minutes with the patient/reviewing patient's chart  [name], PA 11/05/2021, 12:02 PM

## 2021-11-11 ENCOUNTER — Encounter: Payer: Self-pay | Admitting: Nurse Practitioner

## 2021-11-11 ENCOUNTER — Other Ambulatory Visit: Payer: Self-pay

## 2021-11-11 ENCOUNTER — Ambulatory Visit: Payer: Self-pay | Attending: Nurse Practitioner | Admitting: Nurse Practitioner

## 2021-11-11 VITALS — BP 130/85 | HR 71 | Temp 97.9°F | Ht 66.0 in | Wt 231.6 lb

## 2021-11-11 DIAGNOSIS — I10 Essential (primary) hypertension: Secondary | ICD-10-CM

## 2021-11-11 DIAGNOSIS — F319 Bipolar disorder, unspecified: Secondary | ICD-10-CM

## 2021-11-11 DIAGNOSIS — F1221 Cannabis dependence, in remission: Secondary | ICD-10-CM

## 2021-11-11 DIAGNOSIS — E119 Type 2 diabetes mellitus without complications: Secondary | ICD-10-CM

## 2021-11-11 DIAGNOSIS — F17200 Nicotine dependence, unspecified, uncomplicated: Secondary | ICD-10-CM

## 2021-11-11 DIAGNOSIS — F411 Generalized anxiety disorder: Secondary | ICD-10-CM

## 2021-11-11 DIAGNOSIS — Z23 Encounter for immunization: Secondary | ICD-10-CM

## 2021-11-11 DIAGNOSIS — Z794 Long term (current) use of insulin: Secondary | ICD-10-CM

## 2021-11-11 LAB — POCT GLYCOSYLATED HEMOGLOBIN (HGB A1C): HbA1c, POC (controlled diabetic range): 5.9 % (ref 0.0–7.0)

## 2021-11-11 MED ORDER — ALBUTEROL SULFATE HFA 108 (90 BASE) MCG/ACT IN AERS
2.0000 | INHALATION_SPRAY | Freq: Four times a day (QID) | RESPIRATORY_TRACT | 2 refills | Status: DC | PRN
  Filled 2021-11-11: qty 6.7, 25d supply, fill #0
  Filled 2021-12-28: qty 13.4, 50d supply, fill #1

## 2021-11-11 MED ORDER — GABAPENTIN 100 MG PO CAPS
100.0000 mg | ORAL_CAPSULE | Freq: Three times a day (TID) | ORAL | 2 refills | Status: DC
  Filled 2021-11-11 (×2): qty 90, 30d supply, fill #0
  Filled 2021-12-28: qty 90, 30d supply, fill #1
  Filled 2022-02-05: qty 90, 30d supply, fill #2

## 2021-11-11 NOTE — Progress Notes (Addendum)
Assessment & Plan:  Valerie Reilly was seen today for diabetes.  Diagnoses and all orders for this visit:  Type 2 diabetes mellitus without complication, with long-term current use of insulin (HCC) -     POCT glycosylated hemoglobin (Hb A1C) Continue blood sugar control as discussed in office today, low carbohydrate diet, and regular physical exercise as tolerated, 150 minutes per week (30 min each day, 5 days per week, or 50 min 3 days per week). Keep blood sugar logs with fasting goal of 90-130 mg/dl, post prandial (after you eat) less than 180.  For Hypoglycemia: BS <60 and Hyperglycemia BS >400; contact the clinic ASAP. Annual eye exams and foot exams are recommended.   Primary Hypertension Continue all antihypertensives as prescribed.  Reminded to bring in blood pressure log for follow  up appointment.  RECOMMENDATIONS: DASH/Mediterranean Diets are healthier choices for HTN.    Tobacco dependence -     albuterol (VENTOLIN HFA) 108 (90 Base) MCG/ACT inhaler; Inhale 2 puffs into the lungs every 6 (six) hours as needed for wheezing or shortness of breath.   GAD (generalized anxiety disorder) -     gabapentin (NEURONTIN) 100 MG capsule; Take 1 capsule (100 mg total) by mouth 3 (three) times daily.  Need for immunization against influenza -     Flu Vaccine QUAD 60moIM (Fluarix, Fluzone & Alfiuria Quad PF)  Cannabis dependence, in remission  Continues in remission  Bipolar I disorder  Continue follow up with behavioral health as instructed    Patient has been counseled on age-appropriate routine health concerns for screening and prevention. These are reviewed and up-to-date. Referrals have been placed accordingly. Immunizations are up-to-date or declined.    Subjective:   Chief Complaint  Patient presents with   Diabetes   [name] 46y.o. female presents to office today for follow up to DM and HTN. She is accompanied by her mother.  She is followed by behavioral  health for generalized anxiety disorder, bipolar disorder I and insomnia.   She has a past medical history of Seasonal Allergies, Anxiety, Asthma, Bipolar disorder,  Diabetes mellitus, type II, FLD, Gallstones, GERD, History of borderline personality disorder, Hypercholesteremia, Hypertension, Obesity, Psoriasis, and Thyroid disease.     Valerie Reilly was started on gabapentin for what she states was tremors however the office note from behavioral heath states it was started for anxiety. She has been taking it for about a week and doesn't feel any different. I did instruct her that sometimes with certain mood altering medications it take several weeks to notice improvement in symptoms. She states the tremors occur sporadically and are not triggered by anything specific.   DM 2 Well controlled with metformin 1000 mg BID and glipizide XL 10 mg daily Lab Results  Component Value Date   HGBA1C 5.9 11/11/2021  LDL not at goal with simvastatin 40 mg daily.  Lab Results  Component Value Date   LDLCALC 115 (H) 01/09/2021       HTN Blood pressure improved but not quite at goal. She is taking lotensin 20 mg daily as prescribed.  BP Readings from Last 3 Encounters:  11/11/21 130/85  10/13/21 (!) 148/82  10/02/21 (!) 147/84    Review of Systems  Constitutional:  Negative for fever, malaise/fatigue and weight loss.  HENT: Negative.  Negative for nosebleeds.   Eyes: Negative.  Negative for blurred vision, double vision and photophobia.  Respiratory: Negative.  Negative for cough and shortness of breath.   Cardiovascular:  Negative.  Negative for chest pain, palpitations and leg swelling.  Gastrointestinal: Negative.  Negative for heartburn, nausea and vomiting.  Musculoskeletal: Negative.  Negative for myalgias.  Neurological:  Positive for tremors. Negative for dizziness, focal weakness, seizures and headaches.  Psychiatric/Behavioral:  Negative for suicidal ideas. The patient is nervous/anxious.      Past Medical History:  Diagnosis Date   Allergy    Anxiety    Asthma    Bipolar disorder ([place])    Diabetes mellitus    Diabetes mellitus, type II (Valerie Reilly)    Gallstone    GERD (gastroesophageal reflux disease)    History of borderline personality disorder    Hypercholesteremia    Hypertension    Obesity    Psoriasis    Psoriasis    Seasonal allergies    Thyroid disease     Past Surgical History:  Procedure Laterality Date   CHOLECYSTECTOMY N/A 10/02/2021   Procedure: LAPAROSCOPIC CHOLECYSTECTOMY;  Surgeon: Rusty Aus, DO;  Location: [facility];  Service: General;  Laterality: N/A;   TONSILLECTOMY     TUBAL LIGATION      Family History  Problem Relation Age of Onset   Personality disorder Mother    Diabetes Mother    Diabetes Father    Alcohol abuse Maternal Uncle    Depression Maternal Uncle    Diabetes Maternal Grandfather    Breast cancer Neg Hx    Stomach cancer Neg Hx    Colon cancer Neg Hx    Esophageal cancer Neg Hx    Colon polyps Neg Hx    Crohn's disease Neg Hx    Ulcerative colitis Neg Hx    Rectal cancer Neg Hx     Social History Reviewed with no changes to be made today.   Outpatient Medications Prior to Visit  Medication Sig Dispense Refill   benazepril (LOTENSIN) 20 MG tablet Take 1 tablet by mouth once daily 90 tablet 0   cholecalciferol (VITAMIN D) 1000 UNITS tablet Take 1,000 Units by mouth daily.     famotidine (PEPCID) 20 MG tablet Take 20 mg by mouth daily.     fexofenadine (ALLEGRA) 180 MG tablet Take 180 mg by mouth daily.     fluticasone (FLONASE) 50 MCG/ACT nasal spray Place 2 sprays into both nostrils daily.     glipiZIDE (GLUCOTROL XL) 10 MG 24 hr tablet Take 1 tablet (10 mg total) by mouth daily with breakfast. 90 tablet 1   Guselkumab (TREMFYA) 100 MG/ML SOSY Inject 1 syringe ('100mg'$ /58m) into the skin at week 0 and 4 and then 8 weeks thereafter 1 mL 11   hydroxypropyl methylcellulose / hypromellose (ISOPTO TEARS /  GONIOVISC) 2.5 % ophthalmic solution Place 1 drop into both eyes in the morning and at bedtime.     levothyroxine (SYNTHROID) 50 MCG tablet Take 1 tablet (50 mcg total) by mouth daily before breakfast. 90 tablet 0   lithium carbonate 300 MG capsule Take 2 capsules by mouth 2 (two) times daily. 360 capsule 3   Multiple Vitamin (MULTIVITAMIN) capsule Take 1 capsule by mouth daily.     Probiotic Product (PROBIOTIC BLEND PO) Take 1 capsule by mouth daily.     simvastatin (ZOCOR) 40 MG tablet Take 1 tablet by mouth once daily 90 tablet 0   traZODone (DESYREL) 150 MG tablet TAKE 2 & 1/2 TABLETS BY MOUTH AT BEDTIME 225 tablet 3   gabapentin (NEURONTIN) 100 MG capsule Take 1 capsule (100 mg total) by mouth daily. 30 capsule  2   metFORMIN (GLUCOPHAGE) 1000 MG tablet Take 1,000 mg by mouth 2 (two) times daily with a meal.     docusate sodium (COLACE) 100 MG capsule Take 1 capsule (100 mg total) by mouth 2 (two) times daily. (Patient not taking: Reported on 10/13/2021) 60 capsule 2   metFORMIN (GLUCOPHAGE) 1000 MG tablet Take 1 tablet (1,000 mg total) by mouth 2 (two) times daily with a meal. 180 tablet 1   oxyCODONE (ROXICODONE) 5 MG immediate release tablet Take 1 tablet (5 mg total) by mouth every 6 (six) hours as needed for severe pain. (Patient not taking: Reported on 10/13/2021) 20 tablet 0   fluticasone (FLONASE) 50 MCG/ACT nasal spray PLACE 2 SPRAYS INTO BOTH NOSTRILS DAILY. 16 g 1   meloxicam (MOBIC) 15 MG tablet Take 1 tablet (15 mg total) by mouth daily. Prn pain (Patient not taking: Reported on 11/05/2021) 30 tablet 2   No facility-administered medications prior to visit.    No Known Allergies     Objective:    BP 130/85   Pulse 71   Temp 97.9 F (36.6 C) (Oral)   Ht '5\' 6"'$  (1.676 m)   Wt 231 lb 9.6 oz (105.1 kg)   SpO2 99%   BMI 37.38 kg/m  Wt Readings from Last 3 Encounters:  11/11/21 231 lb 9.6 oz (105.1 kg)  11/05/21 232 lb (105.2 kg)  10/13/21 227 lb (103 kg)    Physical  Exam Vitals and nursing note reviewed.  Constitutional:      Appearance: She is well-developed.  HENT:     Head: Normocephalic and atraumatic.  Cardiovascular:     Rate and Rhythm: Normal rate and regular rhythm.     Heart sounds: Normal heart sounds. No murmur heard.    No friction rub. No gallop.  Pulmonary:     Effort: Pulmonary effort is normal. No tachypnea or respiratory distress.     Breath sounds: Normal breath sounds. No decreased breath sounds, wheezing, rhonchi or rales.  Chest:     Chest wall: No tenderness.  Abdominal:     General: Bowel sounds are normal.     Palpations: Abdomen is soft.  Musculoskeletal:        General: Normal range of motion.     Cervical back: Normal range of motion.  Skin:    General: Skin is warm and dry.  Neurological:     Mental Status: She is alert and oriented to person, place, and time.     Coordination: Coordination normal.  Psychiatric:        Behavior: Behavior normal. Behavior is cooperative.        Thought Content: Thought content normal.        Judgment: Judgment normal.          Patient has been counseled extensively about nutrition and exercise as well as the importance of adherence with medications and regular follow-up. The patient was given clear instructions to go to ER or return to medical center if symptoms don't improve, worsen or new problems develop. The patient verbalized understanding.   Follow-up: Return in about 6 months (around 05/12/2022).   Gildardo Pounds, FNP-BC [facility] [place], [place]   11/11/2021, 6:40 PM

## 2021-12-01 ENCOUNTER — Encounter: Payer: Self-pay | Admitting: Gastroenterology

## 2021-12-01 ENCOUNTER — Ambulatory Visit (AMBULATORY_SURGERY_CENTER): Payer: Self-pay | Admitting: Gastroenterology

## 2021-12-01 VITALS — BP 134/81 | HR 60 | Temp 97.8°F | Resp 12 | Ht 67.0 in | Wt 232.0 lb

## 2021-12-01 DIAGNOSIS — D123 Benign neoplasm of transverse colon: Secondary | ICD-10-CM

## 2021-12-01 DIAGNOSIS — D128 Benign neoplasm of rectum: Secondary | ICD-10-CM

## 2021-12-01 DIAGNOSIS — D120 Benign neoplasm of cecum: Secondary | ICD-10-CM

## 2021-12-01 DIAGNOSIS — D122 Benign neoplasm of ascending colon: Secondary | ICD-10-CM

## 2021-12-01 DIAGNOSIS — D125 Benign neoplasm of sigmoid colon: Secondary | ICD-10-CM

## 2021-12-01 DIAGNOSIS — Z1211 Encounter for screening for malignant neoplasm of colon: Secondary | ICD-10-CM

## 2021-12-01 DIAGNOSIS — D124 Benign neoplasm of descending colon: Secondary | ICD-10-CM

## 2021-12-01 MED ORDER — CHOLESTYRAMINE 4 G PO PACK: 4.0000 g | PACK | Freq: Every day | ORAL | 12 refills | Status: DC

## 2021-12-01 MED ORDER — SODIUM CHLORIDE 0.9 % IV SOLN: 500.0000 mL | Freq: Once | INTRAVENOUS | Status: DC

## 2021-12-01 NOTE — Progress Notes (Signed)
Pt's states no medical or surgical changes since previsit or office visit. 

## 2021-12-01 NOTE — Progress Notes (Signed)
GASTROENTEROLOGY PROCEDURE H&P NOTE   Primary Care Physician: Gildardo Pounds, NP  HPI: Valerie Reilly is a 46 y.o. female who presents for colonoscopy for screening.  Past Medical History:  Diagnosis Date   Allergy    Anxiety    Asthma    Bipolar disorder ([place])    Diabetes mellitus    Diabetes mellitus, type II ([place])    Gallstone    GERD (gastroesophageal reflux disease)    History of borderline personality disorder    Hypercholesteremia    Hypertension    Obesity    Psoriasis    Psoriasis    Seasonal allergies    Thyroid disease    Past Surgical History:  Procedure Laterality Date   CHOLECYSTECTOMY N/A 10/02/2021   Procedure: LAPAROSCOPIC CHOLECYSTECTOMY;  Surgeon: Rusty Aus, DO;  Location: [facility];  Service: General;  Laterality: N/A;   TONSILLECTOMY     TUBAL LIGATION     Current Outpatient Medications  Medication Sig Dispense Refill   albuterol (VENTOLIN HFA) 108 (90 Base) MCG/ACT inhaler Inhale 2 puffs into the lungs every 6 (six) hours as needed for wheezing or shortness of breath. 8 g 2   benazepril (LOTENSIN) 20 MG tablet Take 1 tablet by mouth once daily 90 tablet 0   cholecalciferol (VITAMIN D) 1000 UNITS tablet Take 1,000 Units by mouth daily.     famotidine (PEPCID) 20 MG tablet Take 20 mg by mouth daily.     fexofenadine (ALLEGRA) 180 MG tablet Take 180 mg by mouth daily.     fluticasone (FLONASE) 50 MCG/ACT nasal spray Place 2 sprays into both nostrils daily.     gabapentin (NEURONTIN) 100 MG capsule Take 1 capsule (100 mg total) by mouth 3 (three) times daily. 90 capsule 2   glipiZIDE (GLUCOTROL XL) 10 MG 24 hr tablet Take 1 tablet (10 mg total) by mouth daily with breakfast. 90 tablet 1   hydroxypropyl methylcellulose / hypromellose (ISOPTO TEARS / GONIOVISC) 2.5 % ophthalmic solution Place 1 drop into both eyes in the morning and at bedtime.     levothyroxine (SYNTHROID) 50 MCG tablet Take 1 tablet (50 mcg total) by mouth daily before  breakfast. 90 tablet 0   lithium carbonate 300 MG capsule Take 2 capsules by mouth 2 (two) times daily. 360 capsule 3   metFORMIN (GLUCOPHAGE) 1000 MG tablet Take 1 tablet (1,000 mg total) by mouth 2 (two) times daily with a meal. 180 tablet 1   Multiple Vitamin (MULTIVITAMIN) capsule Take 1 capsule by mouth daily.     Probiotic Product (PROBIOTIC BLEND PO) Take 1 capsule by mouth daily.     simvastatin (ZOCOR) 40 MG tablet Take 1 tablet by mouth once daily 90 tablet 0   traZODone (DESYREL) 150 MG tablet TAKE 2 & 1/2 TABLETS BY MOUTH AT BEDTIME 225 tablet 3   Guselkumab (TREMFYA) 100 MG/ML SOSY Inject 1 syringe ('100mg'$ /79m) into the skin at week 0 and 4 and then 8 weeks thereafter 1 mL 11   Current Facility-Administered Medications  Medication Dose Route Frequency Provider Last Rate Last Admin   0.9 %  sodium chloride infusion  500 mL Intravenous Once Mansouraty, GTelford Nab, MD        Current Outpatient Medications:    albuterol (VENTOLIN HFA) 108 (90 Base) MCG/ACT inhaler, Inhale 2 puffs into the lungs every 6 (six) hours as needed for wheezing or shortness of breath., Disp: 8 g, Rfl: 2   benazepril (LOTENSIN) 20 MG tablet, Take  1 tablet by mouth once daily, Disp: 90 tablet, Rfl: 0   cholecalciferol (VITAMIN D) 1000 UNITS tablet, Take 1,000 Units by mouth daily., Disp: , Rfl:    famotidine (PEPCID) 20 MG tablet, Take 20 mg by mouth daily., Disp: , Rfl:    fexofenadine (ALLEGRA) 180 MG tablet, Take 180 mg by mouth daily., Disp: , Rfl:    fluticasone (FLONASE) 50 MCG/ACT nasal spray, Place 2 sprays into both nostrils daily., Disp: , Rfl:    gabapentin (NEURONTIN) 100 MG capsule, Take 1 capsule (100 mg total) by mouth 3 (three) times daily., Disp: 90 capsule, Rfl: 2   glipiZIDE (GLUCOTROL XL) 10 MG 24 hr tablet, Take 1 tablet (10 mg total) by mouth daily with breakfast., Disp: 90 tablet, Rfl: 1   hydroxypropyl methylcellulose / hypromellose (ISOPTO TEARS / GONIOVISC) 2.5 % ophthalmic solution,  Place 1 drop into both eyes in the morning and at bedtime., Disp: , Rfl:    levothyroxine (SYNTHROID) 50 MCG tablet, Take 1 tablet (50 mcg total) by mouth daily before breakfast., Disp: 90 tablet, Rfl: 0   lithium carbonate 300 MG capsule, Take 2 capsules by mouth 2 (two) times daily., Disp: 360 capsule, Rfl: 3   metFORMIN (GLUCOPHAGE) 1000 MG tablet, Take 1 tablet (1,000 mg total) by mouth 2 (two) times daily with a meal., Disp: 180 tablet, Rfl: 1   Multiple Vitamin (MULTIVITAMIN) capsule, Take 1 capsule by mouth daily., Disp: , Rfl:    Probiotic Product (PROBIOTIC BLEND PO), Take 1 capsule by mouth daily., Disp: , Rfl:    simvastatin (ZOCOR) 40 MG tablet, Take 1 tablet by mouth once daily, Disp: 90 tablet, Rfl: 0   traZODone (DESYREL) 150 MG tablet, TAKE 2 & 1/2 TABLETS BY MOUTH AT BEDTIME, Disp: 225 tablet, Rfl: 3   Guselkumab (TREMFYA) 100 MG/ML SOSY, Inject 1 syringe ('100mg'$ /23m) into the skin at week 0 and 4 and then 8 weeks thereafter, Disp: 1 mL, Rfl: 11  Current Facility-Administered Medications:    0.9 %  sodium chloride infusion, 500 mL, Intravenous, Once, Mansouraty, GTelford Nab, MD No Known Allergies Family History  Problem Relation Age of Onset   Personality disorder Mother    Diabetes Mother    Diabetes Father    Alcohol abuse Maternal Uncle    Depression Maternal Uncle    Diabetes Maternal Grandfather    Breast cancer Neg Hx    Stomach cancer Neg Hx    Colon cancer Neg Hx    Esophageal cancer Neg Hx    Colon polyps Neg Hx    Crohn's disease Neg Hx    Ulcerative colitis Neg Hx    Rectal cancer Neg Hx    Social History   Socioeconomic History   Marital status: Divorced    Spouse name: Not on file   Number of children: 1   Years of education: Not on file   Highest education level: Associate degree: occupational, tHotel manager or vocational program  Occupational History   Occupation: delivery newspaper  Tobacco Use   Smoking status: Every Day    Packs/day: 1.00     Types: Cigarettes, E-cigarettes    Passive exposure: Current   Smokeless tobacco: Never  Vaping Use   Vaping Use: Never used  Substance and Sexual Activity   Alcohol use: Yes    Alcohol/week: 0.0 standard drinks of alcohol    Comment: occasional- a few times a year. 6-7 drinks per episdoe   Drug use: No    Types: Marijuana  Comment: last time was 3 yrs ago   Sexual activity: Yes    Birth control/protection: Surgical  Other Topics Concern   Not on file  Social History Narrative   Not on file   Social Determinants of Health   Financial Resource Strain: Not on file  Food Insecurity: No Food Insecurity (04/07/2021)   Hunger Vital Sign    Worried About Running Out of Food in the Last Year: Never true    Ran Out of Food in the Last Year: Never true  Transportation Needs: No Transportation Needs (04/07/2021)   PRAPARE - Hydrologist (Medical): No    Lack of Transportation (Non-Medical): No  Physical Activity: Not on file  Stress: Not on file  Social Connections: Not on file  Intimate Partner Violence: Not on file    Physical Exam: Today's Vitals   12/01/21 0750  BP: 129/78  Pulse: 71  Temp: 97.8 F (36.6 C)  SpO2: 98%  Weight: 232 lb (105.2 kg)  Height: '5\' 7"'$  (1.702 m)   Body mass index is 36.34 kg/m. GEN: NAD EYE: Sclerae anicteric ENT: MMM CV: Non-tachycardic GI: Soft, NT/ND NEURO:  Alert & Oriented x 3  Lab Results: No results for input(s): "WBC", "HGB", "HCT", "PLT" in the last 72 hours. BMET No results for input(s): "NA", "K", "CL", "CO2", "GLUCOSE", "BUN", "CREATININE", "CALCIUM" in the last 72 hours. LFT No results for input(s): "PROT", "ALBUMIN", "AST", "ALT", "ALKPHOS", "BILITOT", "BILIDIR", "IBILI" in the last 72 hours. PT/INR No results for input(s): "LABPROT", "INR" in the last 72 hours.   Impression / Plan: This is a 46 y.o.female who presents for colonoscopy for screening.  The risks and benefits of endoscopic  evaluation/treatment were discussed with the patient and/or family; these include but are not limited to the risk of perforation, infection, bleeding, missed lesions, lack of diagnosis, severe illness requiring hospitalization, as well as anesthesia and sedation related illnesses.  The patient's history has been reviewed, patient examined, no change in status, and deemed stable for procedure.  The patient and/or family is agreeable to proceed.    Justice Britain, MD [facility] Office # [phone]

## 2021-12-01 NOTE — Progress Notes (Signed)
Report to pacu rn. Vss. Care resumed by rn. 

## 2021-12-01 NOTE — Patient Instructions (Addendum)
Handouts Provided:  High Fiber Diet, Polyps and Diverticulosis   Recommendations: - The patient will be observed post-procedure, until all discharge criteria are met. - Discharge patient to home. - Patient has a contact number available for emergencies. The signs and symptoms of potential delayed complications were discussed with the patient. Return to normal activities tomorrow. Written discharge instructions were provided to the patient. - High fiber diet. - Use FiberCon 1-2 tablets PO daily. - Continue present medications. - Await pathology results. - Repeat colonoscopy in likely 3 years for surveillance based on pathology results. - The findings and recommendations were discussed with the patient. - The findings and recommendations were discussed with the patient's family.   YOU HAD AN ENDOSCOPIC PROCEDURE TODAY AT [facility]:   Refer to the procedure report that was given to you for any specific questions about what was found during the examination.  If the procedure report does not answer your questions, please call your gastroenterologist to clarify.  If you requested that your care partner not be given the details of your procedure findings, then the procedure report has been included in a sealed envelope for you to review at your convenience later.  YOU SHOULD EXPECT: Some feelings of bloating in the abdomen. Passage of more gas than usual.  Walking can help get rid of the air that was put into your GI tract during the procedure and reduce the bloating. If you had a lower endoscopy (such as a colonoscopy or flexible sigmoidoscopy) you may notice spotting of blood in your stool or on the toilet paper. If you underwent a bowel prep for your procedure, you may not have a normal bowel movement for a few days.  Please Note:  You might notice some irritation and congestion in your nose or some drainage.  This is from the oxygen used during your procedure.  There is no need  for concern and it should clear up in a day or so.  SYMPTOMS TO REPORT IMMEDIATELY:  Following lower endoscopy (colonoscopy or flexible sigmoidoscopy):  Excessive amounts of blood in the stool  Significant tenderness or worsening of abdominal pains  Swelling of the abdomen that is new, acute  Fever of 100F or higher  For urgent or emergent issues, a gastroenterologist can be reached at any hour by calling [phone]. Do not use MyChart messaging for urgent concerns.    DIET:  We do recommend a small meal at first, but then you may proceed to your regular diet.  Drink plenty of fluids but you should avoid alcoholic beverages for 24 hours.  ACTIVITY:  You should plan to take it easy for the rest of today and you should NOT DRIVE or use heavy machinery until tomorrow (because of the sedation medicines used during the test).    FOLLOW UP: Our staff will call the number listed on your records the next business day following your procedure.  We will call around 7:15- 8:00 am to check on you and address any questions or concerns that you may have regarding the information given to you following your procedure. If we do not reach you, we will leave a message.     If any biopsies were taken you will be contacted by phone or by letter within the next 1-3 weeks.  Please call us at [phone] if you have not heard about the biopsies in 3 weeks.    SIGNATURES/CONFIDENTIALITY: You and/or your care partner have signed paperwork which will be  entered into your electronic medical record.  These signatures attest to the fact that that the information above on your After Visit Summary has been reviewed and is understood.  Full responsibility of the confidentiality of this discharge information lies with you and/or your care-partner.

## 2021-12-01 NOTE — Op Note (Signed)
[facility] Patient Name: Valerie Reilly Procedure Date: 12/01/2021 8:41 AM MRN: [id number] Endoscopist: Justice Britain , MD Age: 46 Referring MD:  Date of Birth: 02/25/1976 Gender: Female Account #: [id number] Procedure:                Colonoscopy Indications:              Screening for colorectal malignant neoplasm Medicines:                Monitored Anesthesia Care Procedure:                Pre-Anesthesia Assessment:                           - Prior to the procedure, a History and Physical                            was performed, and patient medications and                            allergies were reviewed. The patient's tolerance of                            previous anesthesia was also reviewed. The risks                            and benefits of the procedure and the sedation                            options and risks were discussed with the patient.                            All questions were answered, and informed consent                            was obtained. Prior Anticoagulants: The patient has                            taken no previous anticoagulant or antiplatelet                            agents. ASA Grade Assessment: III - A patient with                            severe systemic disease. After reviewing the risks                            and benefits, the patient was deemed in                            satisfactory condition to undergo the procedure.                           After obtaining informed consent, the colonoscope  was passed under direct vision. Throughout the                            procedure, the patient's blood pressure, pulse, and                            oxygen saturations were monitored continuously. The                            Colonoscope was introduced through the anus and                            advanced to the 5 cm into the ileum. The                            colonoscopy was  performed without difficulty. The                            patient tolerated the procedure. Scope In: 8:47:14 AM Scope Out: 0:09:23 AM Scope Withdrawal Time: 0 hours 15 minutes 24 seconds  Total Procedure Duration: 0 hours 19 minutes 5 seconds  Findings:                 The digital rectal exam findings include                            hemorrhoids. Pertinent negatives include no                            palpable rectal lesions.                           The terminal ileum and ileocecal valve appeared                            normal.                           Five sessile polyps were found in the sigmoid colon                            (1), descending colon (2), ascending colon (1) and                            on the ileocecal valve (1). The polyps were 3 to 10                            mm in size. These polyps were removed with a cold                            snare. Resection and retrieval were complete.                           Four sessile polyps were found in the rectum. The  polyps were 2 to 6 mm in size. These polyps were                            removed with a cold snare. Resection and retrieval                            were complete.                           Multiple small-mouthed diverticula were found in                            the entire colon.                           Normal mucosa was found in the entire colon                            otherwise.                           Non-bleeding non-thrombosed external and internal                            hemorrhoids were found during retroflexion, during                            perianal exam and during digital exam. The                            hemorrhoids were Grade II (internal hemorrhoids                            that prolapse but reduce spontaneously). Complications:            No immediate complications. Estimated Blood Loss:     Estimated blood loss was  minimal. Impression:               - Hemorrhoids found on digital rectal exam.                           - The examined portion of the ileum was normal.                           - Five 3 to 10 mm polyps in the sigmoid colon, in                            the descending colon, in the ascending colon and at                            the ileocecal valve, removed with a cold snare.                            Resected and retrieved.                           -  Four 2 to 6 mm polyps in the rectum, removed with                            a cold snare. Resected and retrieved.                           - Diverticulosis in the entire examined colon.                           - Normal mucosa in the entire examined colon                            otherwise.                           - Non-bleeding non-thrombosed external and internal                            hemorrhoids. Recommendation:           - The patient will be observed post-procedure,                            until all discharge criteria are met.                           - Discharge patient to home.                           - Patient has a contact number available for                            emergencies. The signs and symptoms of potential                            delayed complications were discussed with the                            patient. Return to normal activities tomorrow.                            Written discharge instructions were provided to the                            patient.                           - High fiber diet.                           - Use FiberCon 1-2 tablets PO daily.                           - Continue present medications.                           - Await pathology results.                           -  Repeat colonoscopy in likely 3 years for                            surveillance based on pathology results.                           - The findings and recommendations were discussed                             with the patient.                           - The findings and recommendations were discussed                            with the patient's family. Justice Britain, MD 12/01/2021 9:16:11 AM

## 2021-12-02 ENCOUNTER — Telehealth: Payer: Self-pay

## 2021-12-02 NOTE — Telephone Encounter (Signed)
  Follow up Call-     12/01/2021    7:55 AM  Call back number  Post procedure Call Back phone  # [phone]  Permission to leave phone message Yes     Patient questions:  Do you have a fever, pain , or abdominal swelling? No. Pain Score  0 *  Have you tolerated food without any problems? Yes.    Have you been able to return to your normal activities? Yes.    Do you have any questions about your discharge instructions: Diet   No. Medications  No. Follow up visit  No.  Do you have questions or concerns about your Care? No.  Actions: * If pain score is 4 or above: No action needed, pain <4.

## 2021-12-05 ENCOUNTER — Encounter: Payer: Self-pay | Admitting: Gastroenterology

## 2021-12-07 ENCOUNTER — Other Ambulatory Visit: Payer: Self-pay

## 2021-12-10 ENCOUNTER — Ambulatory Visit: Payer: Self-pay

## 2021-12-10 NOTE — Telephone Encounter (Signed)
Unable to reach pt by phone.

## 2021-12-10 NOTE — Telephone Encounter (Signed)
  Chief Complaint: sore throat Symptoms: cough, congestion, sneezing, sinus discomfort and a sore throat, SOB at rest t  Frequency: 1 week ago Pertinent Negatives: Patient denies headache rash Disposition: '[]'$ ED /'[]'$ Urgent Care (no appt availability in office) / '[]'$ Appointment(In office/virtual)/ '[]'$  [facility]/ '[]'$ Home Care/ '[]'$ Refused Recommended Disposition /'[x]'$ [facility]/ '[]'$  Follow-up with PCP Additional Notes: pt hung up. Offered the mobile bus, no answer. Called pt back but no one answered the phone.  Reason for Disposition  [1] Sore throat with cough/cold symptoms AND [2] present > 5 days  Answer Assessment - Initial Assessment Questions 1. ONSET: "When did the throat start hurting?" (Hours or days ago)      1 week ago 2. SEVERITY: "How bad is the sore throat?" (Scale 1-10; mild, moderate or severe)   - MILD (1-3):  Doesn't interfere with eating or normal activities.   - MODERATE (4-7): Interferes with eating some solids and normal activities.   - SEVERE (8-10):  Excruciating pain, interferes with most normal activities.   - SEVERE WITH DYSPHAGIA (10): Can't swallow liquids, drooling.     mild 3. STREP EXPOSURE: "Has there been any exposure to strep within the past week?" If Yes, ask: "What type of contact occurred?"      no 4.  VIRAL SYMPTOMS: "Are there any symptoms of a cold, such as a runny nose, cough, hoarse voice or red eyes?"      Cough white, sneezing, sinus congestion, sinus discomfort 5. FEVER: "Do you have a fever?" If Yes, ask: "What is your temperature, how was it measured, and when did it start?"     no 6. PUS ON THE TONSILS: "Is there pus on the tonsils in the back of your throat?"     Did not know 7. OTHER SYMPTOMS: "Do you have any other symptoms?" (e.g., difficulty breathing, headache, rash)     SOB at rest  8. PREGNANCY: "Is there any chance you are pregnant?" "When was your last menstrual period?"     N/a  Protocols used: Sore  Throat-A-AH

## 2021-12-10 NOTE — Telephone Encounter (Signed)
This encounter was created in error - please disregard.

## 2021-12-10 NOTE — Telephone Encounter (Signed)
Summary: cold and flu like symptoms   The patient has experienced cough, congestion, sneezing, sinus discomfort and a sore throat   The patient has experienced their symptoms for roughly one week   The patient has taken over the counter medication with no improvement   The patient would like to speak with a member of clinical staff further       Called pt - [id number]

## 2021-12-16 ENCOUNTER — Other Ambulatory Visit: Payer: Self-pay | Admitting: Nurse Practitioner

## 2021-12-16 DIAGNOSIS — E119 Type 2 diabetes mellitus without complications: Secondary | ICD-10-CM

## 2021-12-16 DIAGNOSIS — E785 Hyperlipidemia, unspecified: Secondary | ICD-10-CM

## 2021-12-16 NOTE — Telephone Encounter (Signed)
Copied from [place] [id number]. Topic: General - Other >> Dec 16, 2021  2:25 PM Everette C wrote: Reason for CRM: Medication Refill - Medication: metFORMIN (GLUCOPHAGE) 1000 MG tablet [[id number]]   simvastatin (ZOCOR) 40 MG tablet [[id number]]   Has the patient contacted their pharmacy? Yes.  The patient has been directed to contact their PCP  (Agent: If no, request that the patient contact the pharmacy for the refill. If patient does not wish to contact the pharmacy document the reason why and proceed with request.) (Agent: If yes, when and what did the pharmacy advise?)  Preferred Pharmacy (with phone number or street name): [place] ([place]), [place] - [address] [place] [address] Phone: [phone] Fax: [phone] Hours: Not open 24 hours   Has the patient been seen for an appointment in the last year OR does the patient have an upcoming appointment? Yes.    Agent: Please be advised that RX refills may take up to 3 business days. We ask that you follow-up with your pharmacy.

## 2021-12-17 ENCOUNTER — Telehealth (HOSPITAL_BASED_OUTPATIENT_CLINIC_OR_DEPARTMENT_OTHER): Payer: Self-pay | Admitting: Family Medicine

## 2021-12-17 ENCOUNTER — Encounter: Payer: Self-pay | Admitting: Family Medicine

## 2021-12-17 DIAGNOSIS — J0100 Acute maxillary sinusitis, unspecified: Secondary | ICD-10-CM

## 2021-12-17 MED ORDER — SIMVASTATIN 40 MG PO TABS: 40.0000 mg | ORAL_TABLET | Freq: Every day | ORAL | 0 refills | Status: DC

## 2021-12-17 MED ORDER — FLUCONAZOLE 150 MG PO TABS: 150.0000 mg | ORAL_TABLET | Freq: Once | ORAL | 0 refills | Status: AC

## 2021-12-17 MED ORDER — AMOXICILLIN-POT CLAVULANATE 875-125 MG PO TABS: 1.0000 | ORAL_TABLET | Freq: Two times a day (BID) | ORAL | 0 refills | Status: DC

## 2021-12-17 MED ORDER — METFORMIN HCL 1000 MG PO TABS: 1000.0000 mg | ORAL_TABLET | Freq: Two times a day (BID) | ORAL | 1 refills | Status: DC

## 2021-12-17 NOTE — Progress Notes (Signed)
Virtual Visit via Video Note  I connected with Valerie Reilly, on 12/17/2021 at 8:57 AM by video enabled telemedicine device and verified that I am speaking with the correct person using two identifiers.   Consent: I discussed the limitations, risks, security and privacy concerns of performing an evaluation and management service by telemedicine and the availability of in person appointments. I also discussed with the patient that there may be a patient responsible charge related to this service. The patient expressed understanding and agreed to proceed.   Location of Patient: Home  Location of Provider: Clinic   Persons participating in Telemedicine visit: Ari Engelbrecht Antonellis Dr. Margarita Rana     History of Present Illness: Valerie Reilly is a 46 y.o. year old female patient of Geryl Rankins with a history of bipolar disorder, psoriasis, type 2 diabetes mellitus seen for an acute visit   For the last 2 weeks she has had ear soreness, sneezing headache, been stuffed up, chest pain. She has had coughing and congestion, no fever. Throat feels sore and she has post nasal drip as well.  Took a home COVID test which was negative. No sick contacts Mucinex, alka seltzer have provided minimal relief.  She is on Allegra and Flonase chronically.  Past Medical History:  Diagnosis Date   Allergy    Anxiety    Asthma    Bipolar disorder ([place])    Diabetes mellitus    Diabetes mellitus, type II ([place])    Gallstone    GERD (gastroesophageal reflux disease)    History of borderline personality disorder    Hypercholesteremia    Hypertension    Obesity    Psoriasis    Psoriasis    Seasonal allergies    Thyroid disease    No Known Allergies  Current Outpatient Medications on File Prior to Visit  Medication Sig Dispense Refill   albuterol (VENTOLIN HFA) 108 (90 Base) MCG/ACT inhaler Inhale 2 puffs into the lungs every 6 (six) hours as needed for wheezing or shortness of breath. 8 g 2    benazepril (LOTENSIN) 20 MG tablet Take 1 tablet by mouth once daily 90 tablet 0   cholecalciferol (VITAMIN D) 1000 UNITS tablet Take 1,000 Units by mouth daily.     cholestyramine (QUESTRAN) 4 g packet Take 1 packet (4 g total) by mouth daily. 30 each 12   famotidine (PEPCID) 20 MG tablet Take 20 mg by mouth daily.     fexofenadine (ALLEGRA) 180 MG tablet Take 180 mg by mouth daily.     fluticasone (FLONASE) 50 MCG/ACT nasal spray Place 2 sprays into both nostrils daily.     gabapentin (NEURONTIN) 100 MG capsule Take 1 capsule (100 mg total) by mouth 3 (three) times daily. 90 capsule 2   glipiZIDE (GLUCOTROL XL) 10 MG 24 hr tablet Take 1 tablet (10 mg total) by mouth daily with breakfast. 90 tablet 1   Guselkumab (TREMFYA) 100 MG/ML SOSY Inject 1 syringe ('100mg'$ /9m) into the skin at week 0 and 4 and then 8 weeks thereafter 1 mL 11   hydroxypropyl methylcellulose / hypromellose (ISOPTO TEARS / GONIOVISC) 2.5 % ophthalmic solution Place 1 drop into both eyes in the morning and at bedtime.     levothyroxine (SYNTHROID) 50 MCG tablet Take 1 tablet (50 mcg total) by mouth daily before breakfast. 90 tablet 0   lithium carbonate 300 MG capsule Take 2 capsules by mouth 2 (two) times daily. 360 capsule 3   metFORMIN (GLUCOPHAGE) 1000 MG  tablet Take 1 tablet (1,000 mg total) by mouth 2 (two) times daily with a meal. 180 tablet 1   Multiple Vitamin (MULTIVITAMIN) capsule Take 1 capsule by mouth daily.     Probiotic Product (PROBIOTIC BLEND PO) Take 1 capsule by mouth daily.     simvastatin (ZOCOR) 40 MG tablet Take 1 tablet by mouth once daily 90 tablet 0   traZODone (DESYREL) 150 MG tablet TAKE 2 & 1/2 TABLETS BY MOUTH AT BEDTIME 225 tablet 3   No current facility-administered medications on file prior to visit.    ROS: See HPI  Observations/Objective: Awake, alert, vented x3 Nasal speech Tenderness on percussion of maxillary sinus Normal respiration Normal mood     Latest Ref Rng & Units  10/13/2021    9:19 AM 02/02/2021   10:21 AM 01/09/2021   10:52 AM  CMP  Glucose 70 - 99 mg/dL 174  182  168   BUN 6 - 23 mg/dL '11  16  10   '$ Creatinine 0.40 - 1.20 mg/dL 0.66  0.73  0.69   Sodium 135 - 145 mEq/L 136  139  139   Potassium 3.5 - 5.1 mEq/L 4.2  4.3  4.2   Chloride 96 - 112 mEq/L 101  109  102   CO2 19 - 32 mEq/L '25  22  21   '$ Calcium 8.4 - 10.5 mg/dL 10.4  9.5  10.3   Total Protein 6.0 - 8.3 g/dL 7.4  6.6  7.1   Total Bilirubin 0.2 - 1.2 mg/dL 0.4  0.2  0.6   Alkaline Phos 39 - 117 U/L 63  56  75   AST 0 - 37 U/L '12  11  16   '$ ALT 0 - 35 U/L '19  16  19     '$ Lipid Panel     Component Value Date/Time   CHOL 184 01/09/2021 1052   TRIG 183 (H) 01/09/2021 1052   HDL 37 (L) 01/09/2021 1052   CHOLHDL 5.0 (H) 01/09/2021 1052   LDLCALC 115 (H) 01/09/2021 1052   LABVLDL 32 01/09/2021 1052    Lab Results  Component Value Date   HGBA1C 5.9 11/11/2021     Assessment and Plan: 1. Acute non-recurrent maxillary sinusitis Given prolonged duration of symptoms I will place her on an antibiotic Counseled on performing nasal irrigation She is requesting prophylaxis for biotic induced vaginal candidiasis - amoxicillin-clavulanate (AUGMENTIN) 875-125 MG tablet; Take 1 tablet by mouth 2 (two) times daily.  Dispense: 20 tablet; Refill: 0 - fluconazole (DIFLUCAN) 150 MG tablet; Take 1 tablet (150 mg total) by mouth once for 1 dose.  Dispense: 1 tablet; Refill: 0   Follow Up Instructions: Keep previously scheduled appointment with PCP   I discussed the assessment and treatment plan with the patient. The patient was provided an opportunity to ask questions and all were answered. The patient agreed with the plan and demonstrated an understanding of the instructions.   The patient was advised to call back or seek an in-person evaluation if the symptoms worsen or if the condition fails to improve as anticipated.     I provided 15 minutes total of Telehealth time during this encounter  including median intraservice time, reviewing previous notes, investigations, ordering medications, medical decision making, coordinating care and patient verbalized understanding at the end of the visit.     Charlott Rakes, MD, FAAFP. [facility] and [place], [place]   12/17/2021, 8:57 AM

## 2021-12-17 NOTE — Telephone Encounter (Signed)
Requested Prescriptions  Pending Prescriptions Disp Refills  . metFORMIN (GLUCOPHAGE) 1000 MG tablet 180 tablet 1    Sig: Take 1 tablet (1,000 mg total) by mouth 2 (two) times daily with a meal.     Endocrinology:  Diabetes - Biguanides Failed - 12/16/2021  2:54 PM      Failed - B12 Level in normal range and within 720 days    No results found for: "VITAMINB12"       Passed - Cr in normal range and within 360 days    Creatinine  Date Value Ref Range Status  02/02/2021 0.73 0.44 - 1.00 mg/dL Final   Creatinine, Ser  Date Value Ref Range Status  10/13/2021 0.66 0.40 - 1.20 mg/dL Final         Passed - HBA1C is between 0 and 7.9 and within 180 days    HbA1c, POC (controlled diabetic range)  Date Value Ref Range Status  11/11/2021 5.9 0.0 - 7.0 % Final         Passed - eGFR in normal range and within 360 days    GFR, Est AFR Am  Date Value Ref Range Status  04/06/2017 >60 >60 mL/min Final    Comment:    (NOTE) The eGFR has been calculated using the CKD EPI equation. This calculation has not been validated in all clinical situations. eGFR's persistently <60 mL/min signify possible Chronic Kidney Disease.    GFR calc Af Amer  Date Value Ref Range Status  12/04/2019 120 >59 mL/min/1.73 Final    Comment:    **[facility] currently reports eGFR in compliance with the current**   recommendations of the [employer]. [facility] will   update reporting as new guidelines are published from the NKF-ASN   Task force.    GFR, Estimated  Date Value Ref Range Status  02/02/2021 >60 >60 mL/min Final    Comment:    (NOTE) Calculated using the CKD-EPI Creatinine Equation (2021)    GFR  Date Value Ref Range Status  10/13/2021 104.06 >60.00 mL/min Final    Comment:    Calculated using the CKD-EPI Creatinine Equation (2021)   eGFR  Date Value Ref Range Status  01/09/2021 108 >59 mL/min/1.73 Final         Passed - Valid encounter within last 6 months    Recent  Outpatient Visits          Today Acute non-recurrent maxillary sinusitis   [place], Charlane Ferretti, MD   1 month ago Type 2 diabetes mellitus without complication, with long-term current use of insulin ([place])   [place], [place] W, NP   4 months ago Type 2 diabetes mellitus without complication, with long-term current use of insulin [facility])   [place], [place], [place]   8 months ago Type 2 diabetes mellitus without complication, with long-term current use of insulin [facility])   [place], Vernia Buff, NP   11 months ago Leukocytosis, unspecified type   [place], Vernia Buff, NP      Future Appointments            In 4 months Gildardo Pounds, NP [place] - CBC within normal limits and completed in the last 12 months    WBC  Date Value  Ref Range Status  10/13/2021 11.9 (H) 4.0 - 10.5 K/uL Final   RBC  Date Value Ref Range Status  10/13/2021 4.65 3.87 - 5.11 Mil/uL Final   Hemoglobin  Date Value Ref Range Status  10/13/2021 14.8 12.0 - 15.0 g/dL Final  02/02/2021 15.0 12.0 - 15.0 g/dL Final  01/09/2021 16.2 (H) 11.1 - 15.9 g/dL Final   HCT  Date Value Ref Range Status  10/13/2021 43.7 36.0 - 46.0 % Final   Hematocrit  Date Value Ref Range Status  01/09/2021 46.4 34.0 - 46.6 % Final   MCHC  Date Value Ref Range Status  10/13/2021 33.8 30.0 - 36.0 g/dL Final   [facility]  Date Value Ref Range Status  02/02/2021 31.9 26.0 - 34.0 pg Final   MCV  Date Value Ref Range Status  10/13/2021 94.0 78.0 - 100.0 fl Final  01/09/2021 91 79 - 97 fL Final   No results found for: "PLTCOUNTKUC", "LABPLAT", "POCPLA" RDW  Date Value Ref Range Status  10/13/2021 13.7 11.5 - 15.5 % Final  01/09/2021 12.3 11.7 - 15.4 % Final         . simvastatin (ZOCOR) 40  MG tablet 90 tablet 0    Sig: Take 1 tablet (40 mg total) by mouth daily.     Cardiovascular:  Antilipid - Statins Failed - 12/16/2021  2:54 PM      Failed - Lipid Panel in normal range within the last 12 months    Cholesterol, Total  Date Value Ref Range Status  01/09/2021 184 100 - 199 mg/dL Final   LDL Chol Calc (NIH)  Date Value Ref Range Status  01/09/2021 115 (H) 0 - 99 mg/dL Final   HDL  Date Value Ref Range Status  01/09/2021 37 (L) >39 mg/dL Final   Triglycerides  Date Value Ref Range Status  01/09/2021 183 (H) 0 - 149 mg/dL Final         Passed - Patient is not pregnant      Passed - Valid encounter within last 12 months    Recent Outpatient Visits          Today Acute non-recurrent maxillary sinusitis   [place], [place], MD   1 month ago Type 2 diabetes mellitus without complication, with long-term current use of insulin ([place])   [place], [place] W, NP   4 months ago Type 2 diabetes mellitus without complication, with long-term current use of insulin [facility])   [place], [place], [place]   8 months ago Type 2 diabetes mellitus without complication, with long-term current use of insulin [facility])   [place], Vernia Buff, NP   11 months ago Leukocytosis, unspecified type   [place], Vernia Buff, NP      Future Appointments            In 4 months Gildardo Pounds, NP [place]

## 2021-12-17 NOTE — Telephone Encounter (Signed)
Requested medication (s) are due for refill today: unsure  Requested medication (s) are on the active medication list: yes  Last refill:  04/13/21  Future visit scheduled: yes  Notes to clinic:  Unable to refill per protocol, rx is ended,End Date: 12/01/21 after 180 doses. Routing for review     Requested Prescriptions  Pending Prescriptions Disp Refills   metFORMIN (GLUCOPHAGE) 1000 MG tablet 180 tablet 1    Sig: Take 1 tablet (1,000 mg total) by mouth 2 (two) times daily with a meal.     Endocrinology:  Diabetes - Biguanides Failed - 12/16/2021  2:54 PM      Failed - B12 Level in normal range and within 720 days    No results found for: "VITAMINB12"       Passed - Cr in normal range and within 360 days    Creatinine  Date Value Ref Range Status  02/02/2021 0.73 0.44 - 1.00 mg/dL Final   Creatinine, Ser  Date Value Ref Range Status  10/13/2021 0.66 0.40 - 1.20 mg/dL Final         Passed - HBA1C is between 0 and 7.9 and within 180 days    HbA1c, POC (controlled diabetic range)  Date Value Ref Range Status  11/11/2021 5.9 0.0 - 7.0 % Final         Passed - eGFR in normal range and within 360 days    GFR, Est AFR Am  Date Value Ref Range Status  04/06/2017 >60 >60 mL/min Final    Comment:    (NOTE) The eGFR has been calculated using the CKD EPI equation. This calculation has not been validated in all clinical situations. eGFR's persistently <60 mL/min signify possible Chronic Kidney Disease.    GFR calc Af Amer  Date Value Ref Range Status  12/04/2019 120 >59 mL/min/1.73 Final    Comment:    **[facility] currently reports eGFR in compliance with the current**   recommendations of the [employer]. [facility] will   update reporting as new guidelines are published from the NKF-ASN   Task force.    GFR, Estimated  Date Value Ref Range Status  02/02/2021 >60 >60 mL/min Final    Comment:    (NOTE) Calculated using the CKD-EPI Creatinine Equation  (2021)    GFR  Date Value Ref Range Status  10/13/2021 104.06 >60.00 mL/min Final    Comment:    Calculated using the CKD-EPI Creatinine Equation (2021)   eGFR  Date Value Ref Range Status  01/09/2021 108 >59 mL/min/1.73 Final         Passed - Valid encounter within last 6 months    Recent Outpatient Visits           Today Acute non-recurrent maxillary sinusitis   [place], Charlane Ferretti, MD   1 month ago Type 2 diabetes mellitus without complication, with long-term current use of insulin [facility])   [place], [place] W, NP   4 months ago Type 2 diabetes mellitus without complication, with long-term current use of insulin [facility])   [place], [place], [place]   8 months ago Type 2 diabetes mellitus without complication, with long-term current use of insulin [facility])   [place], Vernia Buff, NP   11 months ago Leukocytosis, unspecified type   [place], Vernia Buff, NP  Future Appointments             In 4 months Fleming, Zelda W, NP [facility]            Passed - CBC within normal limits and completed in the last 12 months    WBC  Date Value Ref Range Status  10/13/2021 11.9 (H) 4.0 - 10.5 K/uL Final   RBC  Date Value Ref Range Status  10/13/2021 4.65 3.87 - 5.11 Mil/uL Final   Hemoglobin  Date Value Ref Range Status  10/13/2021 14.8 12.0 - 15.0 g/dL Final  02/02/2021 15.0 12.0 - 15.0 g/dL Final  01/09/2021 16.2 (H) 11.1 - 15.9 g/dL Final   HCT  Date Value Ref Range Status  10/13/2021 43.7 36.0 - 46.0 % Final   Hematocrit  Date Value Ref Range Status  01/09/2021 46.4 34.0 - 46.6 % Final   MCHC  Date Value Ref Range Status  10/13/2021 33.8 30.0 - 36.0 g/dL Final   MCH  Date Value Ref Range Status  02/02/2021 31.9 26.0 - 34.0 pg Final   MCV   Date Value Ref Range Status  10/13/2021 94.0 78.0 - 100.0 fl Final  01/09/2021 91 79 - 97 fL Final   No results found for: "PLTCOUNTKUC", "LABPLAT", "POCPLA" RDW  Date Value Ref Range Status  10/13/2021 13.7 11.5 - 15.5 % Final  01/09/2021 12.3 11.7 - 15.4 % Final         Signed Prescriptions Disp Refills   simvastatin (ZOCOR) 40 MG tablet 90 tablet 0    Sig: Take 1 tablet (40 mg total) by mouth daily.     Cardiovascular:  Antilipid - Statins Failed - 12/16/2021  2:54 PM      Failed - Lipid Panel in normal range within the last 12 months    Cholesterol, Total  Date Value Ref Range Status  01/09/2021 184 100 - 199 mg/dL Final   LDL Chol Calc (NIH)  Date Value Ref Range Status  01/09/2021 115 (H) 0 - 99 mg/dL Final   HDL  Date Value Ref Range Status  01/09/2021 37 (L) >39 mg/dL Final   Triglycerides  Date Value Ref Range Status  01/09/2021 183 (H) 0 - 149 mg/dL Final         Passed - Patient is not pregnant      Passed - Valid encounter within last 12 months    Recent Outpatient Visits           Today Acute non-recurrent maxillary sinusitis   [facility] [place], [place], MD   1 month ago Type 2 diabetes mellitus without complication, with long-term current use of insulin (HCC)   [place] [facility] Fleming, Zelda W, NP   4 months ago Type 2 diabetes mellitus without complication, with long-term current use of insulin (HCC)   [place] [facility] [place], Angela M, PA-C   8 months ago Type 2 diabetes mellitus without complication, with long-term current use of insulin (HCC)   [place] [facility] Fleming, Zelda W, NP   11 months ago Leukocytosis, unspecified type   [place] [facility] [place], Zelda W, NP       Future Appointments             In 4 months Fleming, Zelda W, NP [place] [facility]              

## 2021-12-28 ENCOUNTER — Other Ambulatory Visit: Payer: Self-pay

## 2021-12-29 ENCOUNTER — Other Ambulatory Visit: Payer: Self-pay

## 2021-12-29 ENCOUNTER — Other Ambulatory Visit: Payer: Self-pay | Admitting: Nurse Practitioner

## 2021-12-29 DIAGNOSIS — F17200 Nicotine dependence, unspecified, uncomplicated: Secondary | ICD-10-CM

## 2021-12-29 MED ORDER — ALBUTEROL SULFATE HFA 108 (90 BASE) MCG/ACT IN AERS
2.0000 | INHALATION_SPRAY | Freq: Four times a day (QID) | RESPIRATORY_TRACT | 2 refills | Status: DC | PRN
  Filled 2021-12-29: qty 6.7, 25d supply, fill #0
  Filled 2022-02-05: qty 6.7, 25d supply, fill #1
  Filled 2022-03-24: qty 6.7, 25d supply, fill #2

## 2021-12-30 ENCOUNTER — Other Ambulatory Visit: Payer: Self-pay

## 2021-12-31 ENCOUNTER — Other Ambulatory Visit: Payer: Self-pay

## 2022-01-01 ENCOUNTER — Other Ambulatory Visit: Payer: Self-pay

## 2022-01-06 ENCOUNTER — Other Ambulatory Visit: Payer: Self-pay

## 2022-01-07 ENCOUNTER — Other Ambulatory Visit: Payer: Self-pay

## 2022-01-08 ENCOUNTER — Telehealth (HOSPITAL_COMMUNITY): Payer: No Payment, Other | Admitting: Physician Assistant

## 2022-01-11 ENCOUNTER — Other Ambulatory Visit: Payer: Self-pay

## 2022-01-12 ENCOUNTER — Other Ambulatory Visit: Payer: Self-pay

## 2022-01-12 NOTE — Progress Notes (Unsigned)
[facility] Outpatient Progress Note  01/13/2022 10:31 AM Valerie Reilly  MRN:  [id number]  Assessment:  Valerie Reilly presents for follow-up evaluation. Today, 01/13/22, patient reports continued psychiatric stability on below long-term regimen. Only recent change to regimen was addition of gabapentin at last visit which has been further titrated by PCP for diabetic neuropathy. She reports some sedation and cognitive slowing from gabapentin although identifies benefit for anxiety and neuropathic pain, preferring to remain at current dosing for now. Otherwise, tolerating below regimen well. Patient is due for updated lithium level and plans to obtain through PCP at next visit.   Plan to RTC in 2 months.   Identifying Information: Valerie Reilly is a 46 y.o. female with a history of bipolar 1 disorder, GAD, T2DM, and HTN who is an established patient with [facility] participating in follow-up via video conferencing.   Plan:  # Historical diagnosis of bipolar 1 disorder  GAD Past medication trials: unknown Status of problem: stable Interventions: -- Continue lithium 600 mg BID -- Continue buspirone 30 mg 2 times daily -- Continue gabapentin 100 mg TID (now prescribed by PCP)   # Sleep regulation Past medication trials: unknown Status of problem: stable Interventions: -- Continue trazodone 375 mg at bedtime   # High risk medication use Interventions: -- Lithium:  -- Kidney function within normal limits 10/13/2021  -- TSH within normal limits 04/13/2021; repeat due Feb 2024  -- Lithium level 0.8 10/13/20; patient expresses intent to obtain at PCP visit in March 2024  Patient was given contact information for behavioral health clinic and was instructed to call 911 for emergencies.   Subjective:  Chief Complaint:  Chief Complaint  Patient presents with   Medication Management    Interval History:   Last seen by Trinna Post, PA on 11/05/2021.  At that  time, managed on: Buspirone 30 mg 2 times daily Lithium 600 mg 2 times daily Trazodone 375 mg at bedtime  Patient was started on gabapentin 100 mg daily for anxiety but otherwise no changes were made.  Today, patient reports things have been "overall good" - states she has been on this regimen for years (outside of recent addition of gabapentin) and has worked well for her. 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 Lives with her boyfriend and considers him a good support; feels safe at home.   Sleep is good with trazodone - sleeping about 7-8 hours nightly. Denies dizziness or lightheadedness on high-dose trazodone.   Does endorse some drowsiness related to gabapentin although has been helpful for anxiety and neuropathic pain. Denies any other side effects and would like to continue as recently prescribed by PCP.  Denies SI, HI, AVH. Amenable to continuing medications as prescribed. Discussed need for updated lithium level and will plan to obtain through PCP at visit in March.   Visit Diagnosis:    ICD-10-CM   1. Bipolar I disorder ([place])  F31.9     2. GAD (generalized anxiety disorder)  F41.1       Past Psychiatric History:  Diagnoses: Historical diagnosis of bipolar 1 disorder and borderline personality disorder, cannabis use in remission Medication trials: Has been on current medication regimen since at least 2016 with noted benefit Hospitalizations: multiple - last hospitalization at [facility] in 2014 for episode of mania Substance use:   -- Etoh: denies  -- Denies use of  cannabis or other illicit drugs  -- Tobacco: 2 ppd; precontemplative regarding cessation  Past Medical History:  Past Medical History:  Diagnosis Date   Allergy    Anxiety    Asthma    Bipolar disorder ([place])    Diabetes mellitus    Diabetes mellitus, type II ([place])    Gallstone    GERD (gastroesophageal  reflux disease)    History of borderline personality disorder    Hypercholesteremia    Hypertension    Obesity    Psoriasis    Psoriasis    Seasonal allergies    Thyroid disease     Past Surgical History:  Procedure Laterality Date   CHOLECYSTECTOMY N/A 10/02/2021   Procedure: LAPAROSCOPIC CHOLECYSTECTOMY;  Surgeon: Rusty Aus, DO;  Location: [facility];  Service: General;  Laterality: N/A;   TONSILLECTOMY     TUBAL LIGATION      Family Psychiatric History:  Maternal uncle: depression, alcohol use Mother: personality disorder  Family History:  Family History  Problem Relation Age of Onset   Personality disorder Mother    Diabetes Mother    Diabetes Father    Alcohol abuse Maternal Uncle    Depression Maternal Uncle    Diabetes Maternal Grandfather    Breast cancer Neg Hx    Stomach cancer Neg Hx    Colon cancer Neg Hx    Esophageal cancer Neg Hx    Colon polyps Neg Hx    Crohn's disease Neg Hx    Ulcerative colitis Neg Hx    Rectal cancer Neg Hx     Social History:  Social History   Socioeconomic History   Marital status: Divorced    Spouse name: Not on file   Number of children: 1   Years of education: Not on file   Highest education level: Associate degree: occupational, Hotel manager, or vocational program  Occupational History   Occupation: delivery newspaper  Tobacco Use   Smoking status: Every Day    Packs/day: 2.00    Types: Cigarettes, E-cigarettes    Passive exposure: Current   Smokeless tobacco: Never  Vaping Use   Vaping Use: Never used  Substance and Sexual Activity   Alcohol use: Yes    Comment: occasional- a few times a year   Drug use: No    Types: Marijuana    Comment: last time was 3 yrs ago   Sexual activity: Yes    Birth control/protection: Surgical  Other Topics Concern   Not on file  Social History Narrative   Not on file   Social Determinants of Health   Financial Resource Strain: Not on file  Food Insecurity: No  Food Insecurity (04/07/2021)   Hunger Vital Sign    Worried About Running Out of Food in the Last Year: Never true    Ran Out of Food in the Last Year: Never true  Transportation Needs: No Transportation Needs (04/07/2021)   PRAPARE - Hydrologist (Medical): No    Lack of Transportation (Non-Medical): No  Physical Activity: Not on file  Stress: Not on file  Social Connections: Not on file    Allergies: No Known Allergies  Current Medications: Current Outpatient Medications  Medication Sig Dispense Refill   albuterol (VENTOLIN HFA) 108 (90 Base) MCG/ACT inhaler Inhale 2 puffs into the lungs every 6 (six) hours as needed for wheezing or shortness of breath. 6.7 g 2   busPIRone (BUSPAR) 30 MG tablet TAKE 1 TABLET (30 MG TOTAL)  BY MOUTH 2 (TWO) TIMES DAILY. 180 tablet 3   gabapentin (NEURONTIN) 100 MG capsule Take 1 capsule (100 mg total) by mouth 3 (three) times daily. 90 capsule 2   lithium carbonate 300 MG capsule Take 2 capsules by mouth 2 (two) times daily. 360 capsule 3   traZODone (DESYREL) 150 MG tablet TAKE 2 & 1/2 TABLETS BY MOUTH AT BEDTIME 225 tablet 3   amoxicillin-clavulanate (AUGMENTIN) 875-125 MG tablet Take 1 tablet by mouth 2 (two) times daily. 20 tablet 0   benazepril (LOTENSIN) 20 MG tablet Take 1 tablet by mouth once daily 90 tablet 0   cholecalciferol (VITAMIN D) 1000 UNITS tablet Take 1,000 Units by mouth daily.     cholestyramine (QUESTRAN) 4 g packet Take 1 packet (4 g total) by mouth daily. 30 each 12   famotidine (PEPCID) 20 MG tablet Take 20 mg by mouth daily.     fexofenadine (ALLEGRA) 180 MG tablet Take 180 mg by mouth daily.     fluticasone (FLONASE) 50 MCG/ACT nasal spray Place 2 sprays into both nostrils daily.     glipiZIDE (GLUCOTROL XL) 10 MG 24 hr tablet Take 1 tablet (10 mg total) by mouth daily with breakfast. 90 tablet 1   Guselkumab (TREMFYA) 100 MG/ML SOSY Inject 1 syringe ('100mg'$ /36m) into the skin at week 0 and 4 and then  8 weeks thereafter 1 mL 11   hydroxypropyl methylcellulose / hypromellose (ISOPTO TEARS / GONIOVISC) 2.5 % ophthalmic solution Place 1 drop into both eyes in the morning and at bedtime.     levothyroxine (SYNTHROID) 50 MCG tablet Take 1 tablet (50 mcg total) by mouth daily before breakfast. 90 tablet 0   metFORMIN (GLUCOPHAGE) 1000 MG tablet Take 1 tablet (1,000 mg total) by mouth 2 (two) times daily with a meal. 180 tablet 1   Multiple Vitamin (MULTIVITAMIN) capsule Take 1 capsule by mouth daily.     Probiotic Product (PROBIOTIC BLEND PO) Take 1 capsule by mouth daily.     simvastatin (ZOCOR) 40 MG tablet Take 1 tablet (40 mg total) by mouth daily. 90 tablet 0   No current facility-administered medications for this visit.    ROS: Endorses neuropathic pain  Objective:  Psychiatric Specialty Exam: There were no vitals taken for this visit.There is no height or weight on file to calculate BMI.  General Appearance: Casual and Fairly Groomed  Eye Contact:  Good  Speech:  Clear and Coherent and Normal Rate  Volume:  Normal  Mood:   "pretty good"  Affect:   Euthymic  Thought Content:  Denies AVH; IOR; paranoia    Suicidal Thoughts:  No  Homicidal Thoughts:  No  Thought Process:  Goal Directed and Linear  Orientation:  Full (Time, Place, and Person)    Memory:   Grossly oriented  Judgment:  Good  Insight:  Good  Concentration:  Concentration: Good  Recall:  NA  Fund of Knowledge: Good  Language: Good  Psychomotor Activity:  Normal  Akathisia:  NA  AIMS (if indicated): not done  Assets:  Communication Skills Desire for Improvement Housing Intimacy Leisure Time Social Support Vocational/Educational  ADL's:  Intact  Cognition: WNL  Sleep:  Good   PE: General: sits comfortably in view of camera; no acute distress  Pulm: no increased work of breathing on room air  MSK: all extremity movements appear intact  Neuro: no focal neurological deficits observed  Gait & Station:  unable to assess by video    Metabolic Disorder Labs:  Lab Results  Component Value Date   HGBA1C 5.9 11/11/2021   No results found for: "PROLACTIN" Lab Results  Component Value Date   CHOL 184 01/09/2021   TRIG 183 (H) 01/09/2021   HDL 37 (L) 01/09/2021   CHOLHDL 5.0 (H) 01/09/2021   LDLCALC 115 (H) 01/09/2021   LDLCALC 91 06/09/2020   Lab Results  Component Value Date   TSH 1.300 04/13/2021   TSH 1.350 04/08/2020    Therapeutic Level Labs: Lab Results  Component Value Date   LITHIUM 0.8 10/13/2020   LITHIUM 0.8 12/04/2019   No results found for: "VALPROATE" No results found for: "CBMZ"  Screenings:  [place] Office Visit from 11/11/2021 in [place] Video Visit from 11/05/2021 in [facility] Office Visit from 08/05/2021 in [place] Video Visit from 06/12/2021 in [facility] Video Visit from 02/13/2021 in [facility]  Total GAD-7 Score 0 14 0 17 2      PHQ2-9    [place] Office Visit from 11/11/2021 in [place] Video Visit from 11/05/2021 in [facility] Office Visit from 08/05/2021 in [place] Video Visit from 06/12/2021 in [facility] Video Visit from 02/13/2021 in [facility]  PHQ-2 Total Score 0 0 0 3 0  PHQ-9 Total Score 0 -- -- 6 --      Flowsheet Row Video Visit from 11/05/2021 in [place] [facility] from 09/29/2021 in [place] Video Visit from 06/12/2021 in [place] No Risk No Risk [place] of Care: Collaboration of Care: Medication Management AEB ongoing medication management and Psychiatrist AEB established with  this provider  Patient/Guardian was advised Release of Information must be obtained prior to any record release in order to collaborate their care with an outside provider. Patient/Guardian was advised if they have not already done so to contact the registration department to sign all necessary forms in order for [place] to release information regarding their care.   Consent: Patient/Guardian gives verbal consent for treatment and assignment of benefits for services provided during this visit. Patient/Guardian expressed understanding and agreed to proceed.   Televisit via video: I connected wit patient on 01/13/22 at 10:00 AM EST by a video enabled telemedicine application and verified that I am speaking with the correct person using two identifiers.  Location: Patient: home address in [place] Provider: Remote office in [place]   I discussed the limitations of evaluation and management by telemedicine and the availability of in person appointments. The patient expressed understanding and agreed to proceed.  I discussed the assessment and treatment plan with the patient. The patient was provided an opportunity to ask questions and all were answered. The patient agreed with the plan and demonstrated an understanding of the instructions.   The patient was advised to call back or seek an in-person evaluation if the symptoms worsen or if the condition fails to improve as anticipated.  I provided 40 minutes of non-face-to-face time during this encounter.  Amario Longmore A Samarah Hogle 01/13/2022, 10:31 AM

## 2022-01-12 NOTE — Patient Instructions (Signed)
Thank you for attending your appointment today.  -- We did not make any medication changes today. Please continue medications as prescribed.  Please do not make any changes to medications without first discussing with your provider. If you are experiencing a psychiatric emergency, please call 911 or present to your nearest emergency department. Additional crisis, medication management, and therapy resources are included below.  [facility]  [address], [place], [place] [address] [phone] WALK-IN URGENT CARE 24/7 FOR ANYONE [address], [place], [place]  [phone] Fax: [phone] [email or web address] *Interpreters available *Accepts all insurance and uninsured for Urgent Care needs *Accepts Medicaid and uninsured for outpatient treatment (below)      ONLY FOR [place] Residents  Below:    Outpatient New Patient Assessment/Therapy Walk-ins:        Monday -Thursday 8am until slots are full.        Every Friday 1pm-4pm  (first come, first served)                   New Patient Psychiatry/Medication Management        Monday-Friday 8am-11am (first come, first served)               For all walk-ins we ask that you arrive by 7:15am, because patients will be seen in the order of arrival.   

## 2022-01-13 ENCOUNTER — Telehealth (INDEPENDENT_AMBULATORY_CARE_PROVIDER_SITE_OTHER): Payer: No Payment, Other | Admitting: Psychiatry

## 2022-01-13 ENCOUNTER — Encounter (HOSPITAL_COMMUNITY): Payer: Self-pay | Admitting: Psychiatry

## 2022-01-13 DIAGNOSIS — F411 Generalized anxiety disorder: Secondary | ICD-10-CM

## 2022-01-13 DIAGNOSIS — F319 Bipolar disorder, unspecified: Secondary | ICD-10-CM | POA: Diagnosis not present

## 2022-01-18 ENCOUNTER — Other Ambulatory Visit: Payer: Self-pay | Admitting: Nurse Practitioner

## 2022-01-18 DIAGNOSIS — I10 Essential (primary) hypertension: Secondary | ICD-10-CM

## 2022-01-18 NOTE — Telephone Encounter (Signed)
Medication Refill - Medication: Lotensin 20 mg  generic  90 day supply  Has the patient contacted their pharmacy? Yes.   (Agent: If no, request that the patient contact the pharmacy for the refill. If patient does not wish to contact the pharmacy document the reason why and proceed with request.) (Agent: If yes, when and what did the pharmacy advise?)  Preferred Pharmacy (with phone number or street name): [place] Has the patient been seen for an appointment in the last year OR does the patient have an upcoming appointment? yes  Agent: Please be advised that RX refills may take up to 3 business days. We ask that you follow-up with your pharmacy.

## 2022-01-19 MED ORDER — BENAZEPRIL HCL 20 MG PO TABS: 20.0000 mg | ORAL_TABLET | Freq: Every day | ORAL | 1 refills | Status: DC

## 2022-01-19 NOTE — Telephone Encounter (Signed)
Requested Prescriptions  Pending Prescriptions Disp Refills   benazepril (LOTENSIN) 20 MG tablet 90 tablet 1    Sig: Take 1 tablet (20 mg total) by mouth daily.     Cardiovascular:  ACE Inhibitors Passed - 01/18/2022 10:29 AM      Passed - Cr in normal range and within 180 days    Creatinine  Date Value Ref Range Status  02/02/2021 0.73 0.44 - 1.00 mg/dL Final   Creatinine, Ser  Date Value Ref Range Status  10/13/2021 0.66 0.40 - 1.20 mg/dL Final         Passed - K in normal range and within 180 days    Potassium  Date Value Ref Range Status  10/13/2021 4.2 3.5 - 5.1 mEq/L Final         Passed - Patient is not pregnant      Passed - Last BP in normal range    BP Readings from Last 1 Encounters:  12/01/21 134/81         Passed - Valid encounter within last 6 months    Recent Outpatient Visits           1 month ago Acute non-recurrent maxillary sinusitis   [place], Charlane Ferretti, MD   2 months ago Type 2 diabetes mellitus without complication, with long-term current use of insulin ([place])   [place], [place] W, NP   5 months ago Type 2 diabetes mellitus without complication, with long-term current use of insulin [facility])   [place], [place], [place]   9 months ago Type 2 diabetes mellitus without complication, with long-term current use of insulin [facility])   [place], Vernia Buff, NP   1 year ago Leukocytosis, unspecified type   [place], Vernia Buff, NP       Future Appointments             In 3 months Gildardo Pounds, NP [place]

## 2022-02-01 ENCOUNTER — Other Ambulatory Visit: Payer: Self-pay | Admitting: Nurse Practitioner

## 2022-02-01 DIAGNOSIS — E039 Hypothyroidism, unspecified: Secondary | ICD-10-CM

## 2022-02-05 ENCOUNTER — Other Ambulatory Visit: Payer: Self-pay

## 2022-02-08 ENCOUNTER — Other Ambulatory Visit: Payer: Self-pay

## 2022-02-09 ENCOUNTER — Other Ambulatory Visit: Payer: Self-pay

## 2022-02-11 ENCOUNTER — Other Ambulatory Visit: Payer: Self-pay

## 2022-03-17 ENCOUNTER — Telehealth (HOSPITAL_COMMUNITY): Payer: No Payment, Other | Admitting: Student in an Organized Health Care Education/Training Program

## 2022-03-18 ENCOUNTER — Other Ambulatory Visit: Payer: Self-pay | Admitting: Nurse Practitioner

## 2022-03-18 DIAGNOSIS — E119 Type 2 diabetes mellitus without complications: Secondary | ICD-10-CM

## 2022-03-18 DIAGNOSIS — E785 Hyperlipidemia, unspecified: Secondary | ICD-10-CM

## 2022-03-18 NOTE — Telephone Encounter (Signed)
Requested Prescriptions  Pending Prescriptions Disp Refills   glipiZIDE (GLUCOTROL XL) 10 MG 24 hr tablet [Pharmacy Med Name: glipiZIDE ER 10 MG Oral Tablet Extended Release 24 Hour] 90 tablet 0    Sig: Take 1 tablet by mouth once daily with breakfast     Endocrinology:  Diabetes - Sulfonylureas Passed - 03/18/2022 10:18 AM      Passed - HBA1C is between 0 and 7.9 and within 180 days    HbA1c, POC (controlled diabetic range)  Date Value Ref Range Status  11/11/2021 5.9 0.0 - 7.0 % Final         Passed - Cr in normal range and within 360 days    Creatinine  Date Value Ref Range Status  02/02/2021 0.73 0.44 - 1.00 mg/dL Final   Creatinine, Ser  Date Value Ref Range Status  10/13/2021 0.66 0.40 - 1.20 mg/dL Final         Passed - Valid encounter within last 6 months    Recent Outpatient Visits           3 months ago Acute non-recurrent maxillary sinusitis   [place], [place], MD   4 months ago Type 2 diabetes mellitus without complication, with long-term current use of insulin ([place])   [place], [place] W, NP   7 months ago Type 2 diabetes mellitus without complication, with long-term current use of insulin [facility])   [place], [place], [place]   11 months ago Type 2 diabetes mellitus without complication, with long-term current use of insulin ([place])   [place], Vernia Buff, NP   1 year ago Leukocytosis, unspecified type   [place], Vernia Buff, NP       Future Appointments             In 1 month Gildardo Pounds, NP [place]             simvastatin (ZOCOR) 40 MG tablet [place] Med Name: Simvastatin 40 MG Oral Tablet] 90 tablet 1    Sig: Take 1 tablet by mouth once daily     Cardiovascular:  Antilipid - Statins Failed - 03/18/2022 10:18 AM       Failed - Lipid Panel in normal range within the last 12 months    Cholesterol, Total  Date Value Ref Range Status  01/09/2021 184 100 - 199 mg/dL Final   LDL Chol Calc (NIH)  Date Value Ref Range Status  01/09/2021 115 (H) 0 - 99 mg/dL Final   HDL  Date Value Ref Range Status  01/09/2021 37 (L) >39 mg/dL Final   Triglycerides  Date Value Ref Range Status  01/09/2021 183 (H) 0 - 149 mg/dL Final         Passed - Patient is not pregnant      Passed - Valid encounter within last 12 months    Recent Outpatient Visits           3 months ago Acute non-recurrent maxillary sinusitis   [place], [place], MD   4 months ago Type 2 diabetes mellitus without complication, with long-term current use of insulin [facility])   [place], [place] W, NP   7 months ago Type 2 diabetes mellitus without complication, with long-term current use of insulin ([place])  [place], [place], [place]   11 months ago Type 2 diabetes mellitus without complication, with long-term current use of insulin [facility])   [place], Vernia Buff, NP   1 year ago Leukocytosis, unspecified type   [place], [place] W, NP       Future Appointments             In 1 month Gildardo Pounds, NP [place]

## 2022-03-19 ENCOUNTER — Telehealth (INDEPENDENT_AMBULATORY_CARE_PROVIDER_SITE_OTHER): Payer: No Payment, Other | Admitting: Student in an Organized Health Care Education/Training Program

## 2022-03-19 ENCOUNTER — Encounter (HOSPITAL_COMMUNITY): Payer: Self-pay | Admitting: Student in an Organized Health Care Education/Training Program

## 2022-03-19 ENCOUNTER — Other Ambulatory Visit: Payer: Self-pay

## 2022-03-19 DIAGNOSIS — F319 Bipolar disorder, unspecified: Secondary | ICD-10-CM | POA: Diagnosis not present

## 2022-03-19 DIAGNOSIS — F99 Mental disorder, not otherwise specified: Secondary | ICD-10-CM

## 2022-03-19 DIAGNOSIS — F411 Generalized anxiety disorder: Secondary | ICD-10-CM | POA: Diagnosis not present

## 2022-03-19 DIAGNOSIS — F5105 Insomnia due to other mental disorder: Secondary | ICD-10-CM | POA: Diagnosis not present

## 2022-03-19 MED ORDER — BUSPIRONE HCL 30 MG PO TABS
30.0000 mg | ORAL_TABLET | Freq: Two times a day (BID) | ORAL | 3 refills | Status: DC
  Filled 2022-03-19: qty 60, 30d supply, fill #0
  Filled 2022-04-26 (×2): qty 60, 30d supply, fill #1
  Filled 2022-06-07: qty 60, 30d supply, fill #2

## 2022-03-19 MED ORDER — LITHIUM CARBONATE 300 MG PO CAPS
ORAL_CAPSULE | Freq: Two times a day (BID) | ORAL | 3 refills | Status: DC
  Filled 2022-03-19: qty 120, 30d supply, fill #0
  Filled 2022-04-26 – 2022-06-07 (×3): qty 120, 30d supply, fill #1

## 2022-03-19 MED ORDER — GABAPENTIN 100 MG PO CAPS
100.0000 mg | ORAL_CAPSULE | Freq: Three times a day (TID) | ORAL | 2 refills | Status: DC
  Filled 2022-03-19 – 2022-04-26 (×3): qty 90, 30d supply, fill #0
  Filled 2022-06-07: qty 90, 30d supply, fill #1

## 2022-03-19 MED ORDER — TRAZODONE HCL 150 MG PO TABS
ORAL_TABLET | ORAL | 3 refills | Status: DC
  Filled 2022-03-19: qty 75, 30d supply, fill #0
  Filled 2022-04-26 (×2): qty 75, 30d supply, fill #1
  Filled 2022-06-07: qty 75, 30d supply, fill #2

## 2022-03-19 NOTE — Progress Notes (Signed)
[facility] MD/PA/NP OP Progress Note  03/19/2022 2:54 PM Valerie Reilly  MRN:  [id number]  Chief Complaint:  Chief Complaint  Patient presents with   Follow-up   Virtual Visit via Video Note  I connected with Valerie Reilly on 03/19/22 at  8:30 AM EST by a video enabled telemedicine application and verified that I am speaking with the correct person using two identifiers.  Location: Patient: Home Provider: Office   I discussed the limitations of evaluation and management by telemedicine and the availability of in person appointments. The patient expressed understanding and agreed to proceed.  History of Present Illness: Valerie Reilly is a 47 y.o. female with a history of bipolar 1 disorder, GAD, T2DM, and HTN who is an established patient with [facility] participating in follow-up via video conferencing.   Patient reports that she is compliant with her medications. Patient reports that she is doing well and has started working. 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 Patient reports that she is sleeping well with her trazodone. Patient reports that her appetite is good. Patient reports that her mood is "better" now that she is working. Patient denies SI, HI, AVH. Patient denies racing thoughts.   Patient reports that her daughter is living with her and feels like she is doing well.   Patient reports that the drowsiness/ "slowed down feeling" continues with her gabapentin but does not impact her ability to work, and as long as she is not operating a vehicle she feels okay. Patient will not take the medication when she has to operate a vehicle. Patietn endorses that [place] and even when she is getting supplies she does not really drive, often having another family member drive for her.  Tobacco: decreased, b/c she is not smoking in the house Etoh:  Occasional THC: no   I discussed the assessment and treatment plan with the patient. The patient was provided an opportunity to ask questions and all were answered. The patient agreed with the plan and demonstrated an understanding of the instructions.   The patient was advised to call back or seek an in-person evaluation if the symptoms worsen or if the condition fails to improve as anticipated.  I provided 25 minutes of non-face-to-face time during this encounter.   Freida Busman, MD  Visit Diagnosis:    ICD-10-CM   1. Bipolar I disorder (HCC)  F31.9 traZODone (DESYREL) 150 MG tablet    lithium carbonate 300 MG capsule    2. GAD (generalized anxiety disorder)  F41.1 gabapentin (NEURONTIN) 100 MG capsule    busPIRone (BUSPAR) 30 MG tablet    3. Insomnia due to other mental disorder  F51.05 traZODone (DESYREL) 150 MG tablet   F99       Past Psychiatric History:   Diagnoses: Historical diagnosis of bipolar 1 disorder and borderline personality disorder, cannabis use in remission Medication trials: Has been on current medication regimen since at least 2016 with noted benefit Hospitalizations: multiple - last hospitalization at [facility] in 2014 for episode of mania Substance use:              -- Etoh: denies             -- Denies use of cannabis or other illicit drugs             -- Tobacco: 2  ppd; precontemplative regarding cessation  Past Medical History:  Past Medical History:  Diagnosis Date   Allergy    Anxiety    Asthma    Bipolar disorder ([place])    Diabetes mellitus    Diabetes mellitus, type II ([place])    Gallstone    GERD (gastroesophageal reflux disease)    History of borderline personality disorder    Hypercholesteremia    Hypertension    Obesity    Psoriasis    Psoriasis    Seasonal allergies    Thyroid disease     Past Surgical History:  Procedure Laterality Date   CHOLECYSTECTOMY N/A 10/02/2021   Procedure: LAPAROSCOPIC CHOLECYSTECTOMY;  Surgeon:  Rusty Aus, DO;  Location: [facility];  Service: General;  Laterality: N/A;   TONSILLECTOMY     TUBAL LIGATION      Family Psychiatric History:   Maternal uncle: depression, alcohol use Mother: personality disorder  Family History:  Family History  Problem Relation Age of Onset   Personality disorder Mother    Diabetes Mother    Diabetes Father    Alcohol abuse Maternal Uncle    Depression Maternal Uncle    Diabetes Maternal Grandfather    Breast cancer Neg Hx    Stomach cancer Neg Hx    Colon cancer Neg Hx    Esophageal cancer Neg Hx    Colon polyps Neg Hx    Crohn's disease Neg Hx    Ulcerative colitis Neg Hx    Rectal cancer Neg Hx     Social History:  Social History   Socioeconomic History   Marital status: Divorced    Spouse name: Not on file   Number of children: 1   Years of education: Not on file   Highest education level: Associate degree: occupational, Hotel manager, or vocational program  Occupational History   Occupation: delivery newspaper  Tobacco Use   Smoking status: Every Day    Packs/day: 2.00    Types: Cigarettes, E-cigarettes    Passive exposure: Current   Smokeless tobacco: Never  Vaping Use   Vaping Use: Never used  Substance and Sexual Activity   Alcohol use: Yes    Comment: occasional- a few times a year   Drug use: No    Types: Marijuana    Comment: last time was 3 yrs ago   Sexual activity: Yes    Birth control/protection: Surgical  Other Topics Concern   Not on file  Social History Narrative   Not on file   Social Determinants of Health   Financial Resource Strain: Not on file  Food Insecurity: No Food Insecurity (04/07/2021)   Hunger Vital Sign    Worried About Running Out of Food in the Last Year: Never true    Ran Out of Food in the Last Year: Never true  Transportation Needs: No Transportation Needs (04/07/2021)   PRAPARE - Hydrologist (Medical): No    Lack of Transportation  (Non-Medical): No  Physical Activity: Not on file  Stress: Not on file  Social Connections: Not on file    Allergies: No Known Allergies  Metabolic Disorder Labs: Lab Results  Component Value Date   HGBA1C 5.9 11/11/2021   No results found for: "PROLACTIN" Lab Results  Component Value Date   CHOL 184 01/09/2021   TRIG 183 (H) 01/09/2021   HDL 37 (L) 01/09/2021   CHOLHDL 5.0 (H) 01/09/2021   LDLCALC 115 (H) 01/09/2021   [place] 91 06/09/2020  Lab Results  Component Value Date   TSH 1.300 04/13/2021   TSH 1.350 04/08/2020    Therapeutic Level Labs: Lab Results  Component Value Date   LITHIUM 0.8 10/13/2020   LITHIUM 0.8 12/04/2019   No results found for: "VALPROATE" No results found for: "CBMZ"  Current Medications: Current Outpatient Medications  Medication Sig Dispense Refill   albuterol (VENTOLIN HFA) 108 (90 Base) MCG/ACT inhaler Inhale 2 puffs into the lungs every 6 (six) hours as needed for wheezing or shortness of breath. 6.7 g 2   busPIRone (BUSPAR) 30 MG tablet Take 1 tablet (30 mg total) by mouth 2 (two) times daily. 60 tablet 3   amoxicillin-clavulanate (AUGMENTIN) 875-125 MG tablet Take 1 tablet by mouth 2 (two) times daily. 20 tablet 0   benazepril (LOTENSIN) 20 MG tablet Take 1 tablet (20 mg total) by mouth daily. 90 tablet 1   cholecalciferol (VITAMIN D) 1000 UNITS tablet Take 1,000 Units by mouth daily.     cholestyramine (QUESTRAN) 4 g packet Take 1 packet (4 g total) by mouth daily. 30 each 12   famotidine (PEPCID) 20 MG tablet Take 20 mg by mouth daily.     fexofenadine (ALLEGRA) 180 MG tablet Take 180 mg by mouth daily.     fluticasone (FLONASE) 50 MCG/ACT nasal spray Place 2 sprays into both nostrils daily.     gabapentin (NEURONTIN) 100 MG capsule Take 1 capsule (100 mg total) by mouth 3 (three) times daily. 90 capsule 2   glipiZIDE (GLUCOTROL XL) 10 MG 24 hr tablet Take 1 tablet by mouth once daily with breakfast 90 tablet 0   Guselkumab  (TREMFYA) 100 MG/ML SOSY Inject 1 syringe ('100mg'$ /52m) into the skin at week 0 and 4 and then 8 weeks thereafter 1 mL 11   hydroxypropyl methylcellulose / hypromellose (ISOPTO TEARS / GONIOVISC) 2.5 % ophthalmic solution Place 1 drop into both eyes in the morning and at bedtime.     levothyroxine (SYNTHROID) 50 MCG tablet TAKE 1 TABLET BY MOUTH BEFORE BREAKFAST 90 tablet 1   lithium carbonate 300 MG capsule Take 2 capsules by mouth 2 (two) times daily. 360 capsule 3   metFORMIN (GLUCOPHAGE) 1000 MG tablet Take 1 tablet (1,000 mg total) by mouth 2 (two) times daily with a meal. 180 tablet 1   Multiple Vitamin (MULTIVITAMIN) capsule Take 1 capsule by mouth daily.     Probiotic Product (PROBIOTIC BLEND PO) Take 1 capsule by mouth daily.     simvastatin (ZOCOR) 40 MG tablet Take 1 tablet by mouth once daily 90 tablet 1   traZODone (DESYREL) 150 MG tablet TAKE 2 & 1/2 TABLETS BY MOUTH AT BEDTIME 225 tablet 3   No current facility-administered medications for this visit.     Musculoskeletal: Defer  Psychiatric Specialty Exam: Review of Systems  Psychiatric/Behavioral:  Negative for agitation, confusion, dysphoric mood, hallucinations, sleep disturbance and suicidal ideas.     There were no vitals taken for this visit.There is no height or weight on file to calculate BMI.  General Appearance: Casual  Eye Contact:  Good  Speech:  Clear and Coherent  Volume:  Normal  Mood:  Euthymic  Affect:  Appropriate  Thought Process:  Coherent  Orientation:  Full (Time, Place, and Person)  Thought Content: Logical   Suicidal Thoughts:  No  Homicidal Thoughts:  No  Memory:  Immediate;   Good Recent;   Good  Judgement:  Fair  Insight:  Good  Psychomotor Activity:  Normal  Concentration:  Concentration: Good  Recall:  NA  Fund of Knowledge: Good  Language: Good  Akathisia:  NA  Handed:    AIMS (if indicated): not done  Assets:  Communication Skills Desire for  Improvement Housing Resilience Social Support  ADL's:  Intact  Cognition: WNL  Sleep:  Good   Screenings: GAD-7    Flowsheet Row Office Visit from 11/11/2021 in [facility] Video Visit from 11/05/2021 in [facility] Office Visit from 08/05/2021 in [facility] Video Visit from 06/12/2021 in [facility] Video Visit from 02/13/2021 in [facility]  Total GAD-7 Score 0 14 0 17 2      PHQ2-9    [place] [facility] Office Visit from 11/11/2021 in [facility] Video Visit from 11/05/2021 in [facility] Office Visit from 08/05/2021 in [facility] Video Visit from 06/12/2021 in [facility] Video Visit from 02/13/2021 in [facility]  PHQ-2 Total Score 0 0 0 3 0  PHQ-9 Total Score 0 -- -- 6 --      Flowsheet Row Video Visit from 11/05/2021 in [facility] from 09/29/2021 in [facility] Video Visit from 06/12/2021 in [facility] No Risk No Risk Low Risk        Assessment and Plan:  Venia Riveron [place] presents for follow-up evaluation. Today, 01/13/22, patient reports continued psychiatric stability on below long-term regimen.  Patietn appears stable at this time. Some concern that patient does get some of the sedating affects of gabapentin, but as she is mostly at home she is less concerned and is able to get her work done. Provider recommended that patient not take her gabapentin if she knows she will be operating a vehicle, which patient felt was reasonable because she generally plans when she will drive, [place].  Patient endorses that she does not intend to take any gabapentin before  operating a vehicle. Plan:   # Historical diagnosis of bipolar 1 disorder  GAD Past medication trials: unknown Status of problem: stable Interventions: -- Continue lithium 600 mg BID -- Continue buspirone 30 mg 2 times daily -- Continue gabapentin 100 mg TID (now prescribed by PCP)    # Sleep regulation Past medication trials: unknown Status of problem: stable Interventions: -- Continue trazodone 375 mg at bedtime    # High risk medication use Interventions: -- Lithium:             -- Kidney function within normal limits 10/13/2021             -- TSH within normal limits 04/13/2021; repeat due Feb 2024, patient will get in PCP visit 05/2022             -- Lithium level 0.8 10/13/20; patient expresses intent to obtain at PCP visit in March 2024  Follow-up in approximately 3 months  Collaboration of Care: Collaboration of Care:   Patient/Guardian was advised Release of Information must be obtained prior to any record release in order to collaborate their care with an outside provider. Patient/Guardian was advised if they have not already done so to contact the registration department to sign all necessary forms in order for [place] to release information regarding their care.   Consent: Patient/Guardian gives verbal consent  for treatment and assignment of benefits for services provided during this visit. Patient/Guardian expressed understanding and agreed to proceed.   PGY-3 Freida Busman, MD 03/19/2022, 2:54 PM

## 2022-03-24 ENCOUNTER — Other Ambulatory Visit: Payer: Self-pay

## 2022-03-25 ENCOUNTER — Other Ambulatory Visit: Payer: Self-pay

## 2022-04-26 ENCOUNTER — Other Ambulatory Visit (HOSPITAL_COMMUNITY): Payer: Self-pay

## 2022-04-26 ENCOUNTER — Other Ambulatory Visit: Payer: Self-pay | Admitting: Family Medicine

## 2022-04-26 ENCOUNTER — Other Ambulatory Visit: Payer: Self-pay | Admitting: Nurse Practitioner

## 2022-04-26 ENCOUNTER — Other Ambulatory Visit: Payer: Self-pay

## 2022-04-26 DIAGNOSIS — F17200 Nicotine dependence, unspecified, uncomplicated: Secondary | ICD-10-CM

## 2022-04-27 ENCOUNTER — Other Ambulatory Visit: Payer: Self-pay

## 2022-04-27 MED ORDER — ALBUTEROL SULFATE HFA 108 (90 BASE) MCG/ACT IN AERS
2.0000 | INHALATION_SPRAY | Freq: Four times a day (QID) | RESPIRATORY_TRACT | 2 refills | Status: DC | PRN
  Filled 2022-04-27 – 2022-06-07 (×2): qty 6.7, 25d supply, fill #0

## 2022-04-27 NOTE — Telephone Encounter (Signed)
Medication Refill - Medication: Guselkumab (TREMFYA) 100 MG/ML SOSY   Has the patient contacted their pharmacy? Yes.   (Agent: If no, request that the patient contact the pharmacy for the refill. If patient does not wish to contact the pharmacy document the reason why and proceed with request.) (Agent: If yes, when and what did the pharmacy advise?)  Preferred Pharmacy (with phone number or street name):  [facility] Phone: [phone]  Fax: [phone]     Has the patient been seen for an appointment in the last year OR does the patient have an upcoming appointment? Yes.    Please reach out to patient if rx cannot be sent.

## 2022-04-29 ENCOUNTER — Other Ambulatory Visit: Payer: Self-pay

## 2022-04-30 ENCOUNTER — Other Ambulatory Visit: Payer: Self-pay

## 2022-04-30 ENCOUNTER — Telehealth: Payer: Self-pay | Admitting: Nurse Practitioner

## 2022-04-30 NOTE — Telephone Encounter (Signed)
Patient requesting refill on Guselkumab (TREMFYA) 100 MG/ML SOSY

## 2022-04-30 NOTE — Telephone Encounter (Signed)
Have you seen a PA for this patient

## 2022-04-30 NOTE — Telephone Encounter (Signed)
Patient is calling because the Pharmacy denied her prescription refill for Guselkumab [facility]) 100 MG/ML SOSY [id number]. Patient says she has called in multiple times and still hasn't heard anything in regards to this issue. Patient says she doesn't know why it is being denied and would like more information on why it is being denied. Please follow up with patient.

## 2022-05-03 ENCOUNTER — Other Ambulatory Visit: Payer: Self-pay | Admitting: Nurse Practitioner

## 2022-05-03 MED ORDER — TREMFYA 100 MG/ML ~~LOC~~ SOSY
PREFILLED_SYRINGE | SUBCUTANEOUS | 11 refills | Status: DC
  Filled 2022-05-03: qty 1, 56d supply, fill #0
  Filled 2022-06-28: qty 1, 56d supply, fill #1
  Filled 2022-08-09: qty 1, 56d supply, fill #2
  Filled 2022-08-09 (×2): qty 1, 56d supply, fill #0
  Filled 2022-10-06 (×2): qty 1, 56d supply, fill #1
  Filled 2022-11-26: qty 1, 56d supply, fill #2
  Filled 2023-01-07: qty 1, 56d supply, fill #3

## 2022-05-04 ENCOUNTER — Other Ambulatory Visit (HOSPITAL_COMMUNITY): Payer: Self-pay

## 2022-05-04 ENCOUNTER — Other Ambulatory Visit: Payer: Self-pay

## 2022-05-06 ENCOUNTER — Other Ambulatory Visit (HOSPITAL_COMMUNITY): Payer: Self-pay

## 2022-05-06 ENCOUNTER — Other Ambulatory Visit: Payer: Self-pay

## 2022-05-12 ENCOUNTER — Other Ambulatory Visit: Payer: Self-pay | Admitting: Nurse Practitioner

## 2022-05-12 DIAGNOSIS — Z1231 Encounter for screening mammogram for malignant neoplasm of breast: Secondary | ICD-10-CM

## 2022-05-14 ENCOUNTER — Ambulatory Visit: Payer: Self-pay | Admitting: Nurse Practitioner

## 2022-06-07 ENCOUNTER — Other Ambulatory Visit: Payer: Self-pay

## 2022-06-10 ENCOUNTER — Other Ambulatory Visit: Payer: Self-pay

## 2022-06-10 ENCOUNTER — Ambulatory Visit: Payer: Self-pay | Attending: Nurse Practitioner | Admitting: Nurse Practitioner

## 2022-06-10 ENCOUNTER — Encounter: Payer: Self-pay | Admitting: Nurse Practitioner

## 2022-06-10 ENCOUNTER — Other Ambulatory Visit (HOSPITAL_COMMUNITY)
Admission: RE | Admit: 2022-06-10 | Discharge: 2022-06-10 | Disposition: A | Discharge status: Home / Self Care | Payer: Self-pay | Source: Ambulatory Visit | Attending: Nurse Practitioner | Admitting: Nurse Practitioner

## 2022-06-10 VITALS — BP 131/79 | HR 71 | Ht 67.0 in | Wt 223.0 lb

## 2022-06-10 DIAGNOSIS — E039 Hypothyroidism, unspecified: Secondary | ICD-10-CM

## 2022-06-10 DIAGNOSIS — J3089 Other allergic rhinitis: Secondary | ICD-10-CM

## 2022-06-10 DIAGNOSIS — I10 Essential (primary) hypertension: Secondary | ICD-10-CM

## 2022-06-10 DIAGNOSIS — E119 Type 2 diabetes mellitus without complications: Secondary | ICD-10-CM

## 2022-06-10 DIAGNOSIS — Z114 Encounter for screening for human immunodeficiency virus [HIV]: Secondary | ICD-10-CM

## 2022-06-10 DIAGNOSIS — K219 Gastro-esophageal reflux disease without esophagitis: Secondary | ICD-10-CM

## 2022-06-10 DIAGNOSIS — Z7251 High risk heterosexual behavior: Secondary | ICD-10-CM | POA: Insufficient documentation

## 2022-06-10 DIAGNOSIS — Z79899 Other long term (current) drug therapy: Secondary | ICD-10-CM

## 2022-06-10 DIAGNOSIS — D72829 Elevated white blood cell count, unspecified: Secondary | ICD-10-CM

## 2022-06-10 DIAGNOSIS — F17200 Nicotine dependence, unspecified, uncomplicated: Secondary | ICD-10-CM

## 2022-06-10 DIAGNOSIS — Z794 Long term (current) use of insulin: Secondary | ICD-10-CM

## 2022-06-10 DIAGNOSIS — E7800 Pure hypercholesterolemia, unspecified: Secondary | ICD-10-CM

## 2022-06-10 MED ORDER — GLIPIZIDE ER 10 MG PO TB24
10.00 mg | ORAL_TABLET | Freq: Every day | ORAL | 0 refills | Status: DC
Start: 2022-06-10 — End: 2022-09-13
  Filled 2022-06-10: qty 90, 90d supply, fill #0

## 2022-06-10 MED ORDER — LEVOTHYROXINE SODIUM 50 MCG PO TABS
50.0000 ug | ORAL_TABLET | Freq: Every day | ORAL | 1 refills | Status: DC
Start: 2022-06-10 — End: 2023-01-24
  Filled 2022-06-10: qty 90, 90d supply, fill #0
  Filled 2022-10-26: qty 90, 90d supply, fill #1

## 2022-06-10 MED ORDER — FEXOFENADINE HCL 180 MG PO TABS
180.0000 mg | ORAL_TABLET | Freq: Every day | ORAL | 1 refills | Status: DC
Start: 2022-06-10 — End: 2023-08-02
  Filled 2022-06-10: qty 90, 90d supply, fill #0

## 2022-06-10 MED ORDER — BENAZEPRIL HCL 20 MG PO TABS
20.0000 mg | ORAL_TABLET | Freq: Every day | ORAL | 1 refills | Status: DC
Start: 2022-06-10 — End: 2023-01-07
  Filled 2022-06-10: qty 90, 90d supply, fill #0
  Filled 2022-10-11: qty 90, 90d supply, fill #1

## 2022-06-10 MED ORDER — METFORMIN HCL 1000 MG PO TABS
1000.0000 mg | ORAL_TABLET | Freq: Two times a day (BID) | ORAL | 1 refills | Status: DC
Start: 2022-06-10 — End: 2022-06-13
  Filled 2022-06-10: qty 180, 90d supply, fill #0

## 2022-06-10 MED ORDER — FLUTICASONE PROPIONATE 50 MCG/ACT NA SUSP
2.0000 | Freq: Every day | NASAL | 1 refills | Status: DC
Start: 2022-06-10 — End: 2022-11-12
  Filled 2022-06-10: qty 16, 25d supply, fill #0
  Filled 2022-10-11 (×2): qty 16, 25d supply, fill #1

## 2022-06-10 MED ORDER — SIMVASTATIN 40 MG PO TABS
40.0000 mg | ORAL_TABLET | Freq: Every day | ORAL | 1 refills | Status: DC
Start: 2022-06-10 — End: 2022-12-13
  Filled 2022-06-10: qty 90, 90d supply, fill #0
  Filled 2022-09-13: qty 90, 90d supply, fill #1

## 2022-06-10 MED ORDER — NICOTINE 21 MG/24HR TD PT24
21.0000 mg | MEDICATED_PATCH | Freq: Every day | TRANSDERMAL | 0 refills | Status: DC
Start: 2022-06-10 — End: 2023-06-15
  Filled 2022-06-10: qty 42, 42d supply, fill #0
  Filled 2022-06-14: qty 28, 28d supply, fill #0

## 2022-06-10 MED ORDER — FAMOTIDINE 20 MG PO TABS
20.0000 mg | ORAL_TABLET | Freq: Every day | ORAL | 1 refills | Status: DC
Start: 2022-06-10 — End: 2023-08-02
  Filled 2022-06-10: qty 90, 90d supply, fill #0

## 2022-06-10 NOTE — Progress Notes (Addendum)
Assessment & Plan:  Valerie Reilly was seen today for hypertension.  Diagnoses and all orders for this visit:  Primary hypertension -     CMP14+EGFR   benazepril (LOTENSIN) 20 MG tablet; Take 1 tablet (20 mg total) by mouth daily. Continue all antihypertensives as prescribed.  Reminded to bring in blood pressure log for follow  up appointment.  RECOMMENDATIONS: DASH/Mediterranean Diets are healthier choices for HTN.    Type 2 diabetes mellitus without complication, with long-term current use of insulin Continue blood sugar control as discussed in office today, low carbohydrate diet, and regular physical exercise as tolerated, 150 minutes per week (30 min each day, 5 days per week, or 50 min 3 days per week). Keep blood sugar logs with fasting goal of 90-130 mg/dl, post prandial (after you eat) less than 180.  For Hypoglycemia: BS <60 and Hyperglycemia BS >400; contact the clinic ASAP. Annual eye exams and foot exams are recommended.  -     Microalbumin / creatinine urine ratio -     Hemoglobin A1c -     glipiZIDE (GLUCOTROL XL) 10 MG 24 hr tablet; Take 1 tablet (10 mg total) by mouth daily with breakfast. -     metFORMIN (GLUCOPHAGE) 1000 MG tablet; Take 1 tablet (1,000 mg total) by mouth 2 (two) times daily with a meal.  Leukocytosis, unspecified type -     CBC with Differential  Hypercholesterolemia INSTRUCTIONS: Work on a low fat, heart healthy diet and participate in regular aerobic exercise program by working out at least 150 minutes per week; 5 days a week-30 minutes per day. Avoid red meat/beef/steak,  fried foods. junk foods, sodas, sugary drinks, unhealthy snacking, alcohol and smoking.  Drink at least 80 oz of water per day and monitor your carbohydrate intake daily.   -     Lipid panel  Encounter for screening for HIV -     HIV antibody (with reflex)  High risk heterosexual behavior Requesting STD testing today.  -     HIV antibody (with reflex) -     Cervicovaginal  ancillary only  Lithium use -     Lithium level -     Hypothyroidism, unspecified type Asymptomatic -     levothyroxine (SYNTHROID) 50 MCG tablet; Take 1 tablet (50 mcg total) by mouth daily before breakfast.   Gastroesophageal reflux disease without esophagitis INSTRUCTIONS: Avoid GERD Triggers: acidic, spicy or fried foods, caffeine, coffee, sodas,  alcohol and chocolate.   -     famotidine (PEPCID) 20 MG tablet; Take 1 tablet (20 mg total) by mouth daily.  Environmental and seasonal allergies -     fexofenadine (ALLEGRA) 180 MG tablet; Take 1 tablet (180 mg total) by mouth daily. -     fluticasone (FLONASE) 50 MCG/ACT nasal spray; Place 2 sprays into both nostrils daily.  Acquired hypothyroidism -     Thyroid Panel With TSH  Tobacco dependence -     nicotine (NICODERM CQ - DOSED IN MG/24 HOURS) 21 mg/24hr patch; Place 1 patch (21 mg total) onto the skin daily.   Patient has been counseled on age-appropriate routine health concerns for screening and prevention. These are reviewed and up-to-date. Referrals have been placed accordingly. Immunizations are up-to-date or declined.    Subjective:   Chief Complaint  Patient presents with   Hypertension   Hypertension Pertinent negatives include no blurred vision, chest pain, headaches, malaise/fatigue, palpitations or shortness of breath.   Valerie Reilly 47 y.o. female presents to office  today for f/u to HTN.  She is followed by behavioral health for generalized anxiety disorder, bipolar disorder I and insomnia.    She has a past medical history of Seasonal Allergies, Anxiety, Asthma, Bipolar disorder,  Diabetes mellitus, type II, FLD, Gallstones, GERD, History of borderline personality disorder, Hypercholesteremia, Hypertension, Obesity, Psoriasis, and Thyroid disease.   HTN Well controlled (131/79). She has not taken her benazepril as of yet this morning. However she does endorse daily adherence. 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  BP Readings from Last 3 Encounters:  06/10/22 (!) 145/79  12/01/21 134/81  11/11/21 130/85     DM  Well controlled at this time with metformin and glipizide.  Lab Results  Component Value Date   HGBA1C 5.9 11/11/2021      Review of Systems  Constitutional:  Negative for fever, malaise/fatigue and weight loss.  HENT: Negative.  Negative for nosebleeds.   Eyes: Negative.  Negative for blurred vision, double vision and photophobia.  Respiratory: Negative.  Negative for cough and shortness of breath.   Cardiovascular: Negative.  Negative for chest pain, palpitations and leg swelling.  Gastrointestinal: Negative.  Negative for heartburn, nausea and vomiting.  Genitourinary:        Vaginal irritation  Musculoskeletal: Negative.  Negative for myalgias.  Neurological: Negative.  Negative for dizziness, focal weakness, seizures and headaches.  Endo/Heme/Allergies:  Positive for environmental allergies.  Psychiatric/Behavioral: Negative.  Negative for suicidal ideas.     Past Medical History:  Diagnosis Date   Allergy    Anxiety    Asthma    Bipolar disorder    Diabetes mellitus    Diabetes mellitus, type II    Gallstone    GERD (gastroesophageal reflux disease)    History of borderline personality disorder    Hypercholesteremia    Hypertension    Obesity    Psoriasis    Psoriasis    Seasonal allergies    Thyroid disease     Past Surgical History:  Procedure Laterality Date   CHOLECYSTECTOMY N/A 10/02/2021   Procedure: LAPAROSCOPIC CHOLECYSTECTOMY;  Surgeon: Rusty Aus, DO;  Location: [facility];  Service: General;  Laterality: N/A;   TONSILLECTOMY     TUBAL LIGATION      Family History  Problem Relation Age of Onset   Personality disorder Mother    Diabetes Mother    Diabetes Father    Alcohol abuse Maternal Uncle    Depression Maternal Uncle    Diabetes Maternal Grandfather    Breast cancer Neg Hx    Stomach cancer Neg  Hx    Colon cancer Neg Hx    Esophageal cancer Neg Hx    Colon polyps Neg Hx    Crohn's disease Neg Hx    Ulcerative colitis Neg Hx    Rectal cancer Neg Hx     Social History Reviewed with no changes to be made today.   Outpatient Medications Prior to Visit  Medication Sig Dispense Refill   albuterol (VENTOLIN HFA) 108 (90 Base) MCG/ACT inhaler Inhale 2 puffs into the lungs every 6 (six) hours as needed for wheezing or shortness of breath. 6.7 g 2   busPIRone (BUSPAR) 30 MG tablet Take 1 tablet (30 mg total) by mouth 2 (two) times daily. 60 tablet 3   cholecalciferol (VITAMIN D) 1000 UNITS tablet Take 1,000 Units by mouth daily.     gabapentin (NEURONTIN) 100 MG capsule Take 1 capsule (100 mg total) by mouth 3 (three)  times daily. 90 capsule 2   Guselkumab (TREMFYA) 100 MG/ML SOSY Inject 1 syringe (100mg /34ml) into the skin every 8 weeks 1 mL 11   hydroxypropyl methylcellulose / hypromellose (ISOPTO TEARS / GONIOVISC) 2.5 % ophthalmic solution Place 1 drop into both eyes in the morning and at bedtime.     lithium carbonate 300 MG capsule Take 2 capsules by mouth 2 (two) times daily. 360 capsule 3   Multiple Vitamin (MULTIVITAMIN) capsule Take 1 capsule by mouth daily.     Probiotic Product (PROBIOTIC BLEND PO) Take 1 capsule by mouth daily.     traZODone (DESYREL) 150 MG tablet TAKE 2 & 1/2 TABLETS BY MOUTH AT BEDTIME 225 tablet 3   benazepril (LOTENSIN) 20 MG tablet Take 1 tablet (20 mg total) by mouth daily. 90 tablet 1   famotidine (PEPCID) 20 MG tablet Take 20 mg by mouth daily.     fexofenadine (ALLEGRA) 180 MG tablet Take 180 mg by mouth daily.     fluticasone (FLONASE) 50 MCG/ACT nasal spray Place 2 sprays into both nostrils daily.     glipiZIDE (GLUCOTROL XL) 10 MG 24 hr tablet Take 1 tablet by mouth once daily with breakfast 90 tablet 0   levothyroxine (SYNTHROID) 50 MCG tablet TAKE 1 TABLET BY MOUTH BEFORE BREAKFAST 90 tablet 1   metFORMIN (GLUCOPHAGE) 1000 MG tablet Take 1  tablet (1,000 mg total) by mouth 2 (two) times daily with a meal. 180 tablet 1   simvastatin (ZOCOR) 40 MG tablet Take 1 tablet by mouth once daily 90 tablet 1   amoxicillin-clavulanate (AUGMENTIN) 875-125 MG tablet Take 1 tablet by mouth 2 (two) times daily. (Patient not taking: Reported on 06/10/2022) 20 tablet 0   cholestyramine (QUESTRAN) 4 g packet Take 1 packet (4 g total) by mouth daily. (Patient not taking: Reported on 06/10/2022) 30 each 12   No facility-administered medications prior to visit.    No Known Allergies     Objective:    BP (!) 145/79   Pulse 71   Ht 5\' 7"  (1.702 m)   Wt 223 lb (101.2 kg)   SpO2 99%   BMI 34.93 kg/m  Wt Readings from Last 3 Encounters:  06/10/22 223 lb (101.2 kg)  12/01/21 232 lb (105.2 kg)  11/11/21 231 lb 9.6 oz (105.1 kg)    Physical Exam Vitals and nursing note reviewed.  Constitutional:      Appearance: She is well-developed.  HENT:     Head: Normocephalic and atraumatic.  Cardiovascular:     Rate and Rhythm: Normal rate and regular rhythm.     Heart sounds: Normal heart sounds. No murmur heard.    No friction rub. No gallop.  Pulmonary:     Effort: Pulmonary effort is normal. No tachypnea or respiratory distress.     Breath sounds: Normal breath sounds. No decreased breath sounds, wheezing, rhonchi or rales.  Chest:     Chest wall: No tenderness.  Abdominal:     General: Bowel sounds are normal.     Palpations: Abdomen is soft.  Genitourinary:    Comments: SELF SWAB Musculoskeletal:        General: Normal range of motion.     Cervical back: Normal range of motion.  Skin:    General: Skin is warm and dry.  Neurological:     Mental Status: She is alert and oriented to person, place, and time.     Coordination: Coordination normal.  Psychiatric:  Behavior: Behavior normal. Behavior is cooperative.        Thought Content: Thought content normal.        Judgment: Judgment normal.          Patient has been  counseled extensively about nutrition and exercise as well as the importance of adherence with medications and regular follow-up. The patient was given clear instructions to go to ER or return to medical center if symptoms don't improve, worsen or new problems develop. The patient verbalized understanding.   Follow-up: Return in about 6 months (around 12/10/2022).   Gildardo Pounds, FNP-BC [facility] [place], [place]   06/10/2022, 9:16 AM

## 2022-06-11 LAB — CMP14+EGFR
ALT: 16 IU/L (ref 0–32)
AST: 17 IU/L (ref 0–40)
Albumin/Globulin Ratio: 2 (ref 1.2–2.2)
Albumin: 4.7 g/dL (ref 3.9–4.9)
Alkaline Phosphatase: 85 IU/L (ref 44–121)
BUN/Creatinine Ratio: 11 (ref 9–23)
BUN: 8 mg/dL (ref 6–24)
Bilirubin Total: 0.4 mg/dL (ref 0.0–1.2)
CO2: 21 mmol/L (ref 20–29)
Calcium: 10.2 mg/dL (ref 8.7–10.2)
Chloride: 102 mmol/L (ref 96–106)
Creatinine, Ser: 0.76 mg/dL (ref 0.57–1.00)
Globulin, Total: 2.3 g/dL (ref 1.5–4.5)
Glucose: 125 mg/dL — ABNORMAL HIGH (ref 70–99)
Potassium: 4.3 mmol/L (ref 3.5–5.2)
Sodium: 138 mmol/L (ref 134–144)
Total Protein: 7 g/dL (ref 6.0–8.5)
eGFR: 97 mL/min/{1.73_m2} (ref >59)

## 2022-06-11 LAB — CBC WITH DIFFERENTIAL/PLATELET
Basophils Absolute: 0.1 10*3/uL (ref 0.0–0.2)
Basos: 1 %
EOS (ABSOLUTE): 0.3 10*3/uL (ref 0.0–0.4)
Eos: 3 %
Hematocrit: 47.4 % — ABNORMAL HIGH (ref 34.0–46.6)
Hemoglobin: 16 g/dL — ABNORMAL HIGH (ref 11.1–15.9)
Immature Grans (Abs): 0.1 10*3/uL (ref 0.0–0.1)
Immature Granulocytes: 1 %
Lymphocytes Absolute: 3.1 10*3/uL (ref 0.7–3.1)
Lymphs: 24 %
MCH: 32.3 pg (ref 26.6–33.0)
MCHC: 33.8 g/dL (ref 31.5–35.7)
MCV: 96 fL (ref 79–97)
Monocytes Absolute: 0.7 10*3/uL (ref 0.1–0.9)
Monocytes: 6 %
Neutrophils Absolute: 8.6 10*3/uL — ABNORMAL HIGH (ref 1.4–7.0)
Neutrophils: 65 %
Platelets: 321 10*3/uL (ref 150–450)
RBC: 4.95 x10E6/uL (ref 3.77–5.28)
RDW: 12.6 % (ref 11.7–15.4)
WBC: 12.8 10*3/uL — ABNORMAL HIGH (ref 3.4–10.8)

## 2022-06-11 LAB — THYROID PANEL WITH TSH
Free Thyroxine Index: 3.4 (ref 1.2–4.9)
T3 Uptake Ratio: 34 % (ref 24–39)
T4, Total: 10 ug/dL (ref 4.5–12.0)
TSH: 1.69 u[IU]/mL (ref 0.450–4.500)

## 2022-06-11 LAB — LIPID PANEL
Chol/HDL Ratio: 3.3 ratio (ref 0.0–4.4)
Cholesterol, Total: 119 mg/dL (ref 100–199)
HDL: 36 mg/dL — ABNORMAL LOW (ref >39)
LDL Chol Calc (NIH): 54 mg/dL (ref 0–99)
Triglycerides: 172 mg/dL — ABNORMAL HIGH (ref 0–149)
VLDL Cholesterol Cal: 29 mg/dL (ref 5–40)

## 2022-06-11 LAB — HEMOGLOBIN A1C
Est. average glucose Bld gHb Est-mCnc: 120 mg/dL
Hgb A1c MFr Bld: 5.8 % — ABNORMAL HIGH (ref 4.8–5.6)

## 2022-06-11 LAB — CERVICOVAGINAL ANCILLARY ONLY
Bacterial Vaginitis (gardnerella): POSITIVE — AB
Candida Glabrata: POSITIVE — AB
Candida Vaginitis: POSITIVE — AB
Chlamydia: NEGATIVE
Comment: NEGATIVE
Comment: NEGATIVE
Comment: NEGATIVE
Comment: NEGATIVE
Comment: NEGATIVE
Comment: NORMAL
Neisseria Gonorrhea: NEGATIVE
Trichomonas: NEGATIVE

## 2022-06-11 LAB — LITHIUM LEVEL: Lithium Lvl: 0.5 mmol/L (ref 0.5–1.2)

## 2022-06-11 LAB — HIV ANTIBODY (ROUTINE TESTING W REFLEX): HIV Screen 4th Generation wRfx: NONREACTIVE

## 2022-06-13 ENCOUNTER — Other Ambulatory Visit: Payer: Self-pay | Admitting: Nurse Practitioner

## 2022-06-13 DIAGNOSIS — E119 Type 2 diabetes mellitus without complications: Secondary | ICD-10-CM

## 2022-06-13 DIAGNOSIS — Z7251 High risk heterosexual behavior: Secondary | ICD-10-CM

## 2022-06-13 DIAGNOSIS — D72829 Elevated white blood cell count, unspecified: Secondary | ICD-10-CM

## 2022-06-13 MED ORDER — FLUCONAZOLE 150 MG PO TABS
150.0000 mg | ORAL_TABLET | Freq: Once | ORAL | 0 refills | Status: DC
  Filled 2022-06-14: qty 1, 1d supply, fill #0

## 2022-06-13 MED ORDER — METRONIDAZOLE 500 MG PO TABS
500.0000 mg | ORAL_TABLET | Freq: Two times a day (BID) | ORAL | 0 refills | Status: AC
  Filled 2022-06-14: qty 14, 7d supply, fill #0

## 2022-06-13 MED ORDER — METFORMIN HCL 1000 MG PO TABS
1000.0000 mg | ORAL_TABLET | Freq: Two times a day (BID) | ORAL | 1 refills | Status: DC
Start: 2022-06-13 — End: 2022-11-18

## 2022-06-14 ENCOUNTER — Other Ambulatory Visit: Payer: Self-pay

## 2022-06-15 NOTE — Progress Notes (Unsigned)
BH MD Outpatient Progress Note  06/15/2022 1:56 PM Valerie Reilly  MRN:  [id number]  Assessment:  Valerie Reilly presents for follow-up evaluation. Today, 06/15/22, patient reports   --- continued psychiatric stability on below long-term regimen. Only recent change to regimen was addition of gabapentin at last visit which has been further titrated by PCP for diabetic neuropathy. She reports some sedation and cognitive slowing from gabapentin although identifies benefit for anxiety and neuropathic pain, preferring to remain at current dosing for now. Otherwise, tolerating below regimen well. Patient is due for updated lithium level and plans to obtain through PCP at next visit.   Plan to RTC in 2 months.   Identifying Information: Valerie Reilly is a 47 y.o. female with a history of bipolar 1 disorder, GAD, T2DM, and HTN who is an established patient with [facility] participating in follow-up via video conferencing.   Plan:  # Historical diagnosis of bipolar 1 disorder  GAD Past medication trials: unknown Status of problem: stable Interventions: -- Continue lithium 600 mg BID -- Continue buspirone 30 mg 2 times daily -- Continue gabapentin 100 mg TID (now prescribed by PCP)   # Sleep regulation Past medication trials: unknown Status of problem: stable Interventions: -- Continue trazodone 375 mg at bedtime   # High risk medication use Interventions: -- Lithium:  -- Kidney function within normal limits 06/10/2022  -- TSH within normal limits 06/10/2022  -- Lithium level 0.5 06/10/2022  Patient was given contact information for behavioral health clinic and was instructed to call 911 for emergencies.   Subjective:  Chief Complaint:  No chief complaint on file.   Interval History:   Last seen by resident MD Dr. Morrie Sheldon on 03/19/22. At that time,    Repeat labs: tsh, lithium level through PCP in March? Started on synthroid? 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   Visit Diagnosis:  No diagnosis found.   Past Psychiatric History:  Diagnoses: Historical diagnosis of bipolar 1 disorder and borderline personality disorder, cannabis use in remission Medication trials: Has been on current medication regimen since at least 2016 with noted benefit Hospitalizations: multiple - last hospitalization at [facility] in 2014 for episode of mania Substance use:   -- Etoh: denies  -- Denies use of cannabis or other illicit drugs  -- Tobacco: 2 ppd; precontemplative regarding cessation  Past Medical History:  Past Medical History:  Diagnosis Date   Allergy    Anxiety    Asthma    Bipolar disorder    Diabetes mellitus    Diabetes mellitus, type II    Gallstone    GERD (gastroesophageal reflux disease)    History of borderline personality disorder    Hypercholesteremia    Hypertension    Obesity    Psoriasis    Psoriasis    Seasonal allergies    Thyroid disease     Past Surgical History:  Procedure Laterality Date   CHOLECYSTECTOMY N/A 10/02/2021   Procedure: LAPAROSCOPIC CHOLECYSTECTOMY;  Surgeon: Lewie Chamber, DO;  Location: [facility];  Service: General;  Laterality: N/A;   TONSILLECTOMY     TUBAL LIGATION      Family Psychiatric History:  Maternal uncle: depression, alcohol use Mother: personality disorder  Family History:  Family History  Problem Relation Age of Onset   Personality disorder Mother    Diabetes Mother    Diabetes Father    Alcohol abuse Maternal Uncle  Depression Maternal Uncle    Diabetes Maternal Grandfather    Breast cancer Neg Hx    Stomach cancer Neg Hx    Colon cancer Neg Hx    Esophageal cancer Neg Hx    Colon polyps Neg Hx    Crohn's disease Neg Hx    Ulcerative colitis Neg Hx    Rectal cancer Neg Hx     Social History:  Social History   Socioeconomic History   Marital status: Divorced    Spouse name: Not on file   Number of children:  1   Years of education: Not on file   Highest education level: Associate degree: occupational, Scientist, product/process development, or vocational program  Occupational History   Occupation: delivery newspaper  Tobacco Use   Smoking status: Every Day    Packs/day: 2.00    Years: 30.00    Additional pack years: 0.00    Total pack years: 60.00    Types: Cigarettes, E-cigarettes    Passive exposure: Current   Smokeless tobacco: Never  Vaping Use   Vaping Use: Never used  Substance and Sexual Activity   Alcohol use: Yes    Comment: occasional- a few times a year   Drug use: No    Types: Marijuana    Comment: last time was 3 yrs ago   Sexual activity: Yes    Birth control/protection: Surgical  Other Topics Concern   Not on file  Social History Narrative   Not on file   Social Determinants of Health   Financial Resource Strain: Low Risk  (06/06/2022)   Overall Financial Resource Strain (CARDIA)    Difficulty of Paying Living Expenses: Not hard at all  Food Insecurity: No Food Insecurity (06/06/2022)   Hunger Vital Sign    Worried About Running Out of Food in the Last Year: Never true    Ran Out of Food in the Last Year: Never true  Transportation Needs: No Transportation Needs (06/06/2022)   PRAPARE - Administrator, Civil Service (Medical): No    Lack of Transportation (Non-Medical): No  Physical Activity: Unknown (06/06/2022)   Exercise Vital Sign    Days of Exercise per Week: 0 days    Minutes of Exercise per Session: Not on file  Stress: No Stress Concern Present (06/06/2022)   [employer] of Occupational Health - Occupational Stress Questionnaire    Feeling of Stress : Only a little  Social Connections: Moderately Isolated (06/06/2022)   Social Connection and Isolation Panel [NHANES]    Frequency of Communication with Friends and Family: More than three times a week    Frequency of Social Gatherings with Friends and Family: Once a week    Attends Religious Services: 1 to 4 times  per year    Active Member of [employer] or Organizations: No    Attends Engineer, structural: Not on file    Marital Status: Divorced    Allergies: No Known Allergies  Current Medications: Current Outpatient Medications  Medication Sig Dispense Refill   albuterol (VENTOLIN HFA) 108 (90 Base) MCG/ACT inhaler Inhale 2 puffs into the lungs every 6 (six) hours as needed for wheezing or shortness of breath. 6.7 g 2   benazepril (LOTENSIN) 20 MG tablet Take 1 tablet (20 mg total) by mouth daily. 90 tablet 1   busPIRone (BUSPAR) 30 MG tablet Take 1 tablet (30 mg total) by mouth 2 (two) times daily. 60 tablet 3   cholecalciferol (VITAMIN D) 1000 UNITS tablet Take 1,000 Units  by mouth daily.     famotidine (PEPCID) 20 MG tablet Take 1 tablet (20 mg total) by mouth daily. 90 tablet 1   fexofenadine (ALLEGRA) 180 MG tablet Take 1 tablet (180 mg total) by mouth daily. 90 tablet 1   fluconazole (DIFLUCAN) 150 MG tablet Take 1 tablet (150 mg total) by mouth once for 1 dose. 1 tablet 0   fluticasone (FLONASE) 50 MCG/ACT nasal spray Place 2 sprays into both nostrils daily. 16 g 1   gabapentin (NEURONTIN) 100 MG capsule Take 1 capsule (100 mg total) by mouth 3 (three) times daily. 90 capsule 2   glipiZIDE (GLUCOTROL XL) 10 MG 24 hr tablet Take 1 tablet (10 mg total) by mouth daily with breakfast. 90 tablet 0   Guselkumab (TREMFYA) 100 MG/ML SOSY Inject 1 syringe (100mg /58ml) into the skin every 8 weeks 1 mL 11   hydroxypropyl methylcellulose / hypromellose (ISOPTO TEARS / GONIOVISC) 2.5 % ophthalmic solution Place 1 drop into both eyes in the morning and at bedtime.     levothyroxine (SYNTHROID) 50 MCG tablet Take 1 tablet (50 mcg total) by mouth daily before breakfast. 90 tablet 1   lithium carbonate 300 MG capsule Take 2 capsules by mouth 2 (two) times daily. 360 capsule 3   metFORMIN (GLUCOPHAGE) 1000 MG tablet Take 1 tablet (1,000 mg total) by mouth 2 (two) times daily with a meal. 180 tablet 1    metroNIDAZOLE (FLAGYL) 500 MG tablet Take 1 tablet (500 mg total) by mouth 2 (two) times daily for 7 days. 14 tablet 0   Multiple Vitamin (MULTIVITAMIN) capsule Take 1 capsule by mouth daily.     nicotine (NICODERM CQ - DOSED IN MG/24 HOURS) 21 mg/24hr patch Place 1 patch (21 mg total) onto the skin daily. 42 patch 0   Probiotic Product (PROBIOTIC BLEND PO) Take 1 capsule by mouth daily.     simvastatin (ZOCOR) 40 MG tablet Take 1 tablet (40 mg total) by mouth daily. 90 tablet 1   traZODone (DESYREL) 150 MG tablet TAKE 2 & 1/2 TABLETS BY MOUTH AT BEDTIME 225 tablet 3   No current facility-administered medications for this visit.    ROS: Endorses neuropathic pain  Objective:  Psychiatric Specialty Exam: There were no vitals taken for this visit.There is no height or weight on file to calculate BMI.  General Appearance: Casual and Fairly Groomed  Eye Contact:  Good  Speech:  Clear and Coherent and Normal Rate  Volume:  Normal  Mood:   "pretty good"  Affect:   Euthymic  Thought Content:  Denies AVH; IOR; paranoia    Suicidal Thoughts:  No  Homicidal Thoughts:  No  Thought Process:  Goal Directed and Linear  Orientation:  Full (Time, Place, and Person)    Memory:   Grossly oriented  Judgment:  Good  Insight:  Good  Concentration:  Concentration: Good  Recall:  NA  Fund of Knowledge: Good  Language: Good  Psychomotor Activity:  Normal  Akathisia:  NA  AIMS (if indicated): not done  Assets:  Communication Skills Desire for Improvement Housing Intimacy Leisure Time Social Support Vocational/Educational  ADL's:  Intact  Cognition: WNL  Sleep:  Good   PE: General: sits comfortably in view of camera; no acute distress  Pulm: no increased work of breathing on room air  MSK: all extremity movements appear intact  Neuro: no focal neurological deficits observed  Gait & Station: unable to assess by video    Metabolic Disorder Labs: Lab  Results  Component Value Date    HGBA1C 5.8 (H) 06/10/2022   No results found for: "PROLACTIN" Lab Results  Component Value Date   CHOL 119 06/10/2022   TRIG 172 (H) 06/10/2022   HDL 36 (L) 06/10/2022   CHOLHDL 3.3 06/10/2022   LDLCALC 54 06/10/2022   LDLCALC 115 (H) 01/09/2021   Lab Results  Component Value Date   TSH 1.690 06/10/2022   TSH 1.300 04/13/2021    Therapeutic Level Labs: Lab Results  Component Value Date   LITHIUM 0.5 06/10/2022   LITHIUM 0.8 10/13/2020   No results found for: "VALPROATE" No results found for: "CBMZ"  Screenings:  AUDIT    Flowsheet Row Office Visit from 06/10/2022 in [facility]  Alcohol Use Disorder Identification Test Final Score (AUDIT) 5      GAD-7    Flowsheet Row [facility] Office [facility]  Total GAD-7 Score 0 0 14 0 17      PHQ2-9    Flowsheet Row [facility]  PHQ-2 Total Score 0 0 0 0 3  PHQ-9 Total Score 0 0 -- -- 6      Flowsheet Row [facility]Ad[facility] Testing [facility]  C-SSRS RISK CATEGORY No Risk No Risk Low Risk       Collaboration of Care:  Collaboration of Care: Medication Management AEB ongoing medication management and Psychiatrist AEB established with this provider  Patient/Guardian was advised Release of Information must be obtained prior to any record release in order to collaborate their care with an outside provider. Patient/Guardian was advised if they have not already done so to contact the registration department to sign all necessary forms in order for us to release information regarding their care.   Consent: Patient/Guardian gives verbal consent for treatment and assignment of benefits for services provided during this visit. Patient/Guardian expressed understanding and agreed to proceed.   Televisit via video: I connected with patient on 06/15/22 at  9:00 AM EDT by a video enabled telemedicine application and verified that I am speaking with the correct person using two identifiers.  Location: Patient: home address in  Provider: clinic   I discussed the limitations of evaluation and management by telemedicine and the availability of in person appointments. The patient expressed understanding and agreed to proceed.  I discussed the assessment and treatment plan with the patient. The patient was provided an opportunity to ask questions and all were answered. The patient agreed with the plan and demonstrated an understanding of the instructions.   The patient was advised to call back or seek an in-person evaluation if the symptoms worsen or if the condition fails to improve as anticipated.  I provided *** minutes of non-face-to-face time during  this encounter.  Kyley Laurel A Yan Okray 06/15/2022, 1:56 PM

## 2022-06-16 ENCOUNTER — Other Ambulatory Visit: Payer: Self-pay

## 2022-06-17 ENCOUNTER — Telehealth (INDEPENDENT_AMBULATORY_CARE_PROVIDER_SITE_OTHER): Payer: Self-pay | Admitting: Psychiatry

## 2022-06-17 ENCOUNTER — Encounter (HOSPITAL_COMMUNITY): Payer: Self-pay | Admitting: Psychiatry

## 2022-06-17 ENCOUNTER — Other Ambulatory Visit: Payer: Self-pay

## 2022-06-17 DIAGNOSIS — F319 Bipolar disorder, unspecified: Secondary | ICD-10-CM

## 2022-06-17 DIAGNOSIS — F99 Mental disorder, not otherwise specified: Secondary | ICD-10-CM

## 2022-06-17 DIAGNOSIS — F411 Generalized anxiety disorder: Secondary | ICD-10-CM

## 2022-06-17 DIAGNOSIS — F5105 Insomnia due to other mental disorder: Secondary | ICD-10-CM

## 2022-06-17 DIAGNOSIS — F603 Borderline personality disorder: Secondary | ICD-10-CM

## 2022-06-17 MED ORDER — TRAZODONE HCL 150 MG PO TABS
375.0000 mg | ORAL_TABLET | Freq: Every day | ORAL | 1 refills | Status: DC
Start: 2022-06-17 — End: 2022-09-06
  Filled 2022-06-17: qty 225, 90d supply, fill #0
  Filled 2022-07-27: qty 75, 30d supply, fill #0
  Filled 2022-08-30 (×2): qty 75, 30d supply, fill #1

## 2022-06-17 MED ORDER — LITHIUM CARBONATE 300 MG PO CAPS
600.0000 mg | ORAL_CAPSULE | Freq: Two times a day (BID) | ORAL | 1 refills | Status: DC
Start: 2022-06-17 — End: 2022-09-06
  Filled 2022-06-17: qty 360, 90d supply, fill #0
  Filled 2022-08-17: qty 120, 30d supply, fill #0
  Filled 2022-08-26: qty 360, 90d supply, fill #0
  Filled 2022-08-26: qty 120, 30d supply, fill #0

## 2022-06-17 MED ORDER — BUSPIRONE HCL 30 MG PO TABS
30.0000 mg | ORAL_TABLET | Freq: Two times a day (BID) | ORAL | 1 refills | Status: DC
Start: 2022-06-17 — End: 2022-09-06
  Filled 2022-06-17 – 2022-07-14 (×2): qty 180, 90d supply, fill #0

## 2022-06-17 MED ORDER — GABAPENTIN 100 MG PO CAPS
100.0000 mg | ORAL_CAPSULE | Freq: Three times a day (TID) | ORAL | 1 refills | Status: DC
Start: 2022-06-17 — End: 2022-09-06
  Filled 2022-06-17 – 2022-08-17 (×2): qty 270, 90d supply, fill #0
  Filled 2022-08-30: qty 90, 30d supply, fill #0

## 2022-06-17 NOTE — Patient Instructions (Signed)
Thank you for attending your appointment today.  -- We did not make any medication changes today. Please continue medications as prescribed.  Please do not make any changes to medications without first discussing with your provider. If you are experiencing a psychiatric emergency, please call 911 or present to your nearest emergency department. Additional crisis, medication management, and therapy resources are included below.  [facility]  [address], [place], [place] [address] [phone] WALK-IN URGENT CARE 24/7 FOR ANYONE [address], [place], [place]  [phone] Fax: [phone] [email or web address] *Interpreters available *Accepts all insurance and uninsured for Urgent Care needs *Accepts Medicaid and uninsured for outpatient treatment (below)      ONLY FOR [place] Residents  Below:    Outpatient New Patient Assessment/Therapy Walk-ins:        Monday -Thursday 8am until slots are full.        Every Friday 1pm-4pm  (first come, first served)                   New Patient Psychiatry/Medication Management        Monday-Friday 8am-11am (first come, first served)               For all walk-ins we ask that you arrive by 7:15am, because patients will be seen in the order of arrival.   

## 2022-06-24 ENCOUNTER — Ambulatory Visit: Payer: Self-pay

## 2022-06-28 ENCOUNTER — Other Ambulatory Visit: Payer: Self-pay

## 2022-07-01 ENCOUNTER — Other Ambulatory Visit: Payer: Self-pay

## 2022-07-14 ENCOUNTER — Other Ambulatory Visit: Payer: Self-pay

## 2022-07-15 ENCOUNTER — Other Ambulatory Visit: Payer: Self-pay

## 2022-07-21 ENCOUNTER — Other Ambulatory Visit: Payer: Self-pay | Admitting: Obstetrics and Gynecology

## 2022-07-21 DIAGNOSIS — Z1231 Encounter for screening mammogram for malignant neoplasm of breast: Secondary | ICD-10-CM

## 2022-07-26 ENCOUNTER — Ambulatory Visit (HOSPITAL_COMMUNITY): Payer: Self-pay | Admitting: Mental Health

## 2022-07-27 ENCOUNTER — Other Ambulatory Visit: Payer: Self-pay

## 2022-07-29 ENCOUNTER — Other Ambulatory Visit: Payer: Self-pay

## 2022-07-29 ENCOUNTER — Ambulatory Visit: Payer: Self-pay | Admitting: Hematology and Oncology

## 2022-07-29 ENCOUNTER — Ambulatory Visit
Admission: RE | Admit: 2022-07-29 | Discharge: 2022-07-29 | Disposition: A | Discharge status: Home / Self Care | Payer: No Typology Code available for payment source | Source: Ambulatory Visit | Attending: Obstetrics and Gynecology | Admitting: Obstetrics and Gynecology

## 2022-07-29 VITALS — BP 110/78 | Wt 222.0 lb

## 2022-07-29 DIAGNOSIS — Z1231 Encounter for screening mammogram for malignant neoplasm of breast: Secondary | ICD-10-CM

## 2022-07-29 NOTE — Progress Notes (Signed)
[name] is a 47 y.o. female who presents to [facility] clinic today with no complaints.    Pap Smear: Pap not smear completed today. Last Pap smear was 10/13/2020 and was abnormal - ASCUS/ HPV- . Per patient has no history of an abnormal Pap smear. Last Pap smear result is available in Epic.   Physical exam: Breasts Breasts symmetrical. No skin abnormalities bilateral breasts. No nipple retraction bilateral breasts. No nipple discharge bilateral breasts. No lymphadenopathy. No lumps palpated bilateral breasts.   MS DIGITAL SCREENING TOMO BILATERAL  Result Date: 04/08/2021 CLINICAL DATA:  Screening. EXAM: DIGITAL SCREENING BILATERAL MAMMOGRAM WITH TOMOSYNTHESIS AND CAD TECHNIQUE: Bilateral screening digital craniocaudal and mediolateral oblique mammograms were obtained. Bilateral screening digital breast tomosynthesis was performed. The images were evaluated with computer-aided detection. COMPARISON:  Previous exam(s). ACR Breast Density Category b: There are scattered areas of fibroglandular density. FINDINGS: There are no findings suspicious for malignancy. IMPRESSION: No mammographic evidence of malignancy. A result letter of this screening mammogram will be mailed directly to the patient. RECOMMENDATION: Screening mammogram in one year. (Code:SM-B-01Y) BI-RADS CATEGORY  1: Negative. Electronically Signed   By: Bary Richard M.D.   On: 04/08/2021 10:56   MS DIGITAL SCREENING TOMO BILATERAL  Result Date: 03/12/2020 CLINICAL DATA:  Screening. EXAM: DIGITAL SCREENING BILATERAL MAMMOGRAM WITH TOMO AND CAD COMPARISON:  Previous exam(s). ACR Breast Density Category b: There are scattered areas of fibroglandular density. FINDINGS: There are no findings suspicious for malignancy. Images were processed with CAD. IMPRESSION: No mammographic evidence of malignancy. A result letter of this screening mammogram will be mailed directly to the patient. RECOMMENDATION: Screening mammogram in one year.  (Code:SM-B-01Y) BI-RADS CATEGORY  1: Negative. Electronically Signed   By: Sande Brothers M.D.   On: 03/12/2020 09:00   MM 3D SCREEN BREAST BILATERAL  Result Date: 08/12/2017 CLINICAL DATA:  Screening. EXAM: DIGITAL SCREENING BILATERAL MAMMOGRAM WITH TOMO AND CAD COMPARISON:  Previous exam(s). ACR Breast Density Category b: There are scattered areas of fibroglandular density. FINDINGS: There are no findings suspicious for malignancy. Images were processed with CAD. IMPRESSION: No mammographic evidence of malignancy. A result letter of this screening mammogram will be mailed directly to the patient. RECOMMENDATION: Screening mammogram in one year. (Code:SM-B-01Y) BI-RADS CATEGORY  1: Negative. Electronically Signed   By: Annia Belt M.D.   On: 08/12/2017 17:18        Pelvic/Bimanual Pap is not indicated today    Smoking History: Patient has is a current smoker at 2 packs per day and was referred to quit line.    Patient Navigation: Patient education provided. Access to services provided for patient through [facility] program. No interpreter provided. No transportation provided   Colorectal Cancer Screening: Per patient has had colonoscopy completed on 12/01/21. 1. Surgical, colon, ascending and ileocecal valve, descending, sigmoid, polyp (5)- tubular adenomas without high-grade dysplasia or malignancy. 2. Surgical, colon, rectum, polyp (4)- tubular adenoma without high-grade dysplasia or malignancy- hyperplastic polyps.   No complaints today. Follow up in 3 years.    Breast and Cervical Cancer Risk Assessment: Patient does not have family history of breast cancer, known genetic mutations, or radiation treatment to the chest before age 24. Patient does not have history of cervical dysplasia, immunocompromised, or DES exposure in-utero.  Risk Scores as of 07/29/2022     [name]           5-year 0.59 %   Lifetime 6.14 %  Last calculated by Caprice Red, CMA on 07/29/2022 at  2:39 PM         A: [facility] exam without pap smear No complaints with benign exam.   P: Referred patient to the [facility] for a screening mammogram. Appointment scheduled 07/29/22.  Pascal Lux, NP 07/29/2022 2:51 PM

## 2022-07-29 NOTE — Patient Instructions (Signed)
Taught Valerie Reilly about self breast awareness and gave educational materials to take home. Patient did not need a Pap smear today due to last Pap smear was in 10/13/2020 per patient.  Let her know [facility] will cover Pap smears every 5 years unless has a history of abnormal Pap smears. Referred patient to the [facility] for screening mammogram. Appointment scheduled for 07/29/22. Patient aware of appointment and will be there. Let patient know will follow up with her within the next couple weeks with results. Valerie Reilly verbalized understanding.  Pascal Lux, NP 2:59 PM

## 2022-08-09 ENCOUNTER — Other Ambulatory Visit: Payer: Self-pay

## 2022-08-09 ENCOUNTER — Ambulatory Visit (INDEPENDENT_AMBULATORY_CARE_PROVIDER_SITE_OTHER): Payer: No Payment, Other | Admitting: Mental Health

## 2022-08-09 ENCOUNTER — Other Ambulatory Visit (HOSPITAL_COMMUNITY): Payer: Self-pay

## 2022-08-09 DIAGNOSIS — F411 Generalized anxiety disorder: Secondary | ICD-10-CM

## 2022-08-09 DIAGNOSIS — F319 Bipolar disorder, unspecified: Secondary | ICD-10-CM

## 2022-08-09 DIAGNOSIS — F603 Borderline personality disorder: Secondary | ICD-10-CM
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
## 2022-08-10 ENCOUNTER — Other Ambulatory Visit: Payer: Self-pay

## 2022-08-11 ENCOUNTER — Other Ambulatory Visit: Payer: Self-pay

## 2022-08-17 ENCOUNTER — Other Ambulatory Visit: Payer: Self-pay

## 2022-08-23 ENCOUNTER — Other Ambulatory Visit: Payer: Self-pay

## 2022-08-24 ENCOUNTER — Other Ambulatory Visit: Payer: Self-pay

## 2022-08-26 ENCOUNTER — Other Ambulatory Visit: Payer: Self-pay

## 2022-08-30 ENCOUNTER — Other Ambulatory Visit: Payer: Self-pay

## 2022-09-02 ENCOUNTER — Other Ambulatory Visit: Payer: Self-pay

## 2022-09-03 NOTE — Progress Notes (Unsigned)
BH MD Outpatient Progress Note  09/06/2022 10:50 AM Valerie Reilly  MRN:  [id number]  Assessment:  Valerie Reilly presents for follow-up evaluation. Today, 09/06/22, patient reports continued psychiatric stability and is tolerating below regimen well without adverse effect. No recent SI or self-harm behaviors. She has recently established in therapy and is motivated to participate in therapy moving forward. No changes to plan of care at this time.  RTC in 3 months by video.  Identifying Information: Valerie Reilly is a 47 y.o. female with a history of bipolar 1 disorder, GAD, T2DM, hypothyroidism on Synthroid, and HTN who is an established patient with [facility] participating in follow-up via video conferencing.   Plan:  # Historical diagnosis of bipolar 1 disorder  GAD # Borderline personality disorder Past medication trials: unknown Status of problem: stable Interventions: -- Continue lithium 600 mg BID -- Continue buspirone 30 mg 2 times daily -- Continue gabapentin 100 mg TID -- Patient has recently established for individual psychotherapy with Valerie Reilly [facility]  # Sleep regulation Past medication trials: unknown Status of problem: stable Interventions: -- Continue trazodone 375 mg at bedtime   # High risk medication use Interventions: -- Lithium:  -- Kidney function within normal limits 06/10/2022  -- TSH within normal limits 06/10/2022  -- Lithium level 0.5 06/10/2022  # Health access -- Patient is currently uninsured: she provided consent for this writer to share her contact information with clinic case manager to assist with looking into Medicaid or other options for insurance  Patient was given contact information for behavioral health clinic and was instructed to call 911 for emergencies.   Subjective:  Chief Complaint:  Chief Complaint  Patient presents with   Medication Management    Interval History:   Patient reports she  is consistently taking medications and feels they continue to work well. Had initial appt with therapist and motivated to continue therapy. Describes mood as "pretty good." Anxiety has been "nothing over the top" and feels she experiences normal levels of anxiety in response to stressors. Denies SI or SIB urges. Sleeping well with use of trazodone; sleeping about 7 hours nightly. Denies adverse effects to medication regimen including dizziness, constipation, tremor. 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   Continues to smoke; has not yet used patches. Feels she needs to focus on therapy first before she attempts to stop smoking.  Has looked into Medicaid but not sure if she meets criteria; amenable to being connected with CM for assistance.   No questions/concerns at this time.  Visit Diagnosis:    ICD-10-CM   1. Bipolar I disorder (HCC)  F31.9 lithium carbonate 300 MG capsule    traZODone (DESYREL) 150 MG tablet    2. GAD (generalized anxiety disorder)  F41.1 busPIRone (BUSPAR) 30 MG tablet    gabapentin (NEURONTIN) 100 MG capsule    3. Insomnia due to other mental disorder  F51.05 traZODone (DESYREL) 150 MG tablet   F99     4. Borderline personality disorder (HCC)  F60.3       Past Psychiatric History:  Diagnoses: Historical diagnosis of bipolar 1 disorder, borderline personality disorder, cannabis use in remission Medication trials: Has been on current medication regimen since at least 2016 with noted benefit Hospitalizations: multiple - last hospitalization at [facility] in 2014 for episode of mania Suicide attempt: yes - many years ago; hx of 2-3 previous attempts  Substance use:   -- Etoh: denies  -- Denies  use of cannabis or other illicit drugs  -- Tobacco: 3 ppd; precontemplative regarding cessation  Past Medical History:  Past Medical History:  Diagnosis Date   Allergy    Anxiety    Asthma    Bipolar disorder (HCC)    Diabetes mellitus     Diabetes mellitus, type II (HCC)    Gallstone    GERD (gastroesophageal reflux disease)    History of borderline personality disorder    Hypercholesteremia    Hypertension    Obesity    Psoriasis    Psoriasis    Seasonal allergies    Thyroid disease     Past Surgical History:  Procedure Laterality Date   CHOLECYSTECTOMY N/A 10/02/2021   Procedure: LAPAROSCOPIC CHOLECYSTECTOMY;  Surgeon: Lewie Chamber, DO;  Location: [facility];  Service: General;  Laterality: N/A;   TONSILLECTOMY     TUBAL LIGATION      Family Psychiatric History:  Maternal uncle: depression, alcohol use Mother: personality disorder  Family History:  Family History  Problem Relation Age of Onset   Personality disorder Mother    Diabetes Mother    Diabetes Father    Alcohol abuse Maternal Uncle    Depression Maternal Uncle    Diabetes Maternal Grandfather    Breast cancer Neg Hx    Stomach cancer Neg Hx    Colon cancer Neg Hx    Esophageal cancer Neg Hx    Colon polyps Neg Hx    Crohn's disease Neg Hx    Ulcerative colitis Neg Hx    Rectal cancer Neg Hx     Social History:  Social History   Socioeconomic History   Marital status: Divorced    Spouse name: Not on file   Number of children: 1   Years of education: Not on file   Highest education level: Associate degree: occupational, Scientist, product/process development, or vocational program  Occupational History   Occupation: delivery newspaper  Tobacco Use   Smoking status: Every Day    Packs/day: 3.00    Years: 30.00    Additional pack years: 0.00    Total pack years: 90.00    Types: Cigarettes, E-cigarettes    Passive exposure: Current   Smokeless tobacco: Never  Vaping Use   Vaping Use: Never used  Substance and Sexual Activity   Alcohol use: Yes    Comment: occasional- a few times a year   Drug use: No    Types: Marijuana    Comment: last time was 3 yrs ago   Sexual activity: Yes    Birth control/protection: Surgical    Comment: Tubal ligation   Other Topics Concern   Not on file  Social History Narrative   Not on file   Social Determinants of Health   Financial Resource Strain: Low Risk  (06/06/2022)   Overall Financial Resource Strain (CARDIA)    Difficulty of Paying Living Expenses: Not hard at all  Food Insecurity: No Food Insecurity (07/29/2022)   Hunger Vital Sign    Worried About Running Out of Food in the Last Year: Never true    Ran Out of Food in the Last Year: Never true  Transportation Needs: No Transportation Needs (07/29/2022)   PRAPARE - Administrator, Civil Service (Medical): No    Lack of Transportation (Non-Medical): No  Physical Activity: Inactive (08/09/2022)   Exercise Vital Sign    Days of Exercise per Week: 0 days    Minutes of Exercise per Session: 0 min  Stress:  No Stress Concern Present (06/06/2022)   [employer] of Occupational Health - Occupational Stress Questionnaire    Feeling of Stress : Only a little  Social Connections: Moderately Isolated (06/06/2022)   Social Connection and Isolation Panel [NHANES]    Frequency of Communication with Friends and Family: More than three times a week    Frequency of Social Gatherings with Friends and Family: Once a week    Attends Religious Services: 1 to 4 times per year    Active Member of [employer] or Organizations: No    Attends Engineer, structural: Not on file    Marital Status: Divorced    Allergies: No Known Allergies  Current Medications: Current Outpatient Medications  Medication Sig Dispense Refill   albuterol (VENTOLIN HFA) 108 (90 Base) MCG/ACT inhaler Inhale 2 puffs into the lungs every 6 (six) hours as needed for wheezing or shortness of breath. 6.7 g 2   benazepril (LOTENSIN) 20 MG tablet Take 1 tablet (20 mg total) by mouth daily. 90 tablet 1   busPIRone (BUSPAR) 30 MG tablet Take 1 tablet (30 mg total) by mouth 2 (two) times daily. 180 tablet 1   cholecalciferol (VITAMIN D) 1000 UNITS tablet Take 1,000 Units by  mouth daily.     famotidine (PEPCID) 20 MG tablet Take 1 tablet (20 mg total) by mouth daily. 90 tablet 1   fexofenadine (ALLEGRA) 180 MG tablet Take 1 tablet (180 mg total) by mouth daily. 90 tablet 1   fluticasone (FLONASE) 50 MCG/ACT nasal spray Place 2 sprays into both nostrils daily. 16 g 1   gabapentin (NEURONTIN) 100 MG capsule Take 1 capsule (100 mg total) by mouth 3 (three) times daily. 270 capsule 1   glipiZIDE (GLUCOTROL XL) 10 MG 24 hr tablet Take 1 tablet (10 mg total) by mouth daily with breakfast. 90 tablet 0   Guselkumab (TREMFYA) 100 MG/ML SOSY Inject 1 syringe (100mg /69ml) into the skin every 8 weeks 1 mL 11   hydroxypropyl methylcellulose / hypromellose (ISOPTO TEARS / GONIOVISC) 2.5 % ophthalmic solution Place 1 drop into both eyes in the morning and at bedtime.     levothyroxine (SYNTHROID) 50 MCG tablet Take 1 tablet (50 mcg total) by mouth daily before breakfast. 90 tablet 1   lithium carbonate 300 MG capsule Take 2 capsules (600 mg total) by mouth in the morning and at bedtime. 360 capsule 1   metFORMIN (GLUCOPHAGE) 1000 MG tablet Take 1 tablet (1,000 mg total) by mouth 2 (two) times daily with a meal. 180 tablet 1   Multiple Vitamin (MULTIVITAMIN) capsule Take 1 capsule by mouth daily.     nicotine (NICODERM CQ - DOSED IN MG/24 HOURS) 21 mg/24hr patch Place 1 patch (21 mg total) onto the skin daily. (Patient not taking: Reported on 07/29/2022) 42 patch 0   Probiotic Product (PROBIOTIC BLEND PO) Take 1 capsule by mouth daily.     simvastatin (ZOCOR) 40 MG tablet Take 1 tablet (40 mg total) by mouth daily. 90 tablet 1   traZODone (DESYREL) 150 MG tablet Take 2.5 tablets (375 mg total) by mouth at bedtime. 225 tablet 1   No current facility-administered medications for this visit.    ROS: Denies any physical complaints  Objective:  Psychiatric Specialty Exam: There were no vitals taken for this visit.There is no height or weight on file to calculate BMI.  General  Appearance: Casual and Fairly Groomed  Eye Contact:  Good  Speech:  Clear and Coherent and Normal Rate  Volume:  Normal  Mood:   "pretty good"  Affect:   Overall euthymic however easily frustrated; blunted  Thought Content:  Denies AVH; IOR; paranoia    Suicidal Thoughts:  No  Homicidal Thoughts:  No  Thought Process:  Goal Directed and Linear  Orientation:  Full (Time, Place, and Person)    Memory:   Grossly oriented  Judgment:  Good  Insight:  Good  Concentration:  Concentration: Good  Recall:  NA  Fund of Knowledge: Good  Language: Good  Psychomotor Activity:  Normal  Akathisia:  NA  AIMS (if indicated): not done  Assets:  Communication Skills Desire for Improvement Housing Intimacy Leisure Time Social Support Vocational/Educational  ADL's:  Intact  Cognition: WNL  Sleep:  Good   PE: General: sits comfortably in view of camera; no acute distress  Pulm: no increased work of breathing on room air  MSK: all extremity movements appear intact  Neuro: no focal neurological deficits observed  Gait & Station: unable to assess by video    Metabolic Disorder Labs: Lab Results  Component Value Date   HGBA1C 5.8 (H) 06/10/2022   No results found for: "PROLACTIN" Lab Results  Component Value Date   CHOL 119 06/10/2022   TRIG 172 (H) 06/10/2022   HDL 36 (L) 06/10/2022   CHOLHDL 3.3 06/10/2022   LDLCALC 54 06/10/2022   LDLCALC 115 (H) 01/09/2021   Lab Results  Component Value Date   TSH 1.690 06/10/2022   TSH 1.300 04/13/2021    Therapeutic Level Labs: Lab Results  Component Value Date   LITHIUM 0.5 06/10/2022   LITHIUM 0.8 10/13/2020   No results found for: "VALPROATE" No results found for: "CBMZ"  Screenings:  AUDIT    Flowsheet Row Office Visit from 06/10/2022 in [facility]  Alcohol Use Disorder Identification Test Final Score (AUDIT) 5      GAD-7    Flowsheet Row Counselor from 08/09/2022 in [facility] Office Visit from 06/10/2022 in [facility] Office Visit from 11/11/2021 in [facility] Video Visit from 11/05/2021 in [facility] Office Visit from 08/05/2021 in [facility]  Total GAD-7 Score 13 0 0 14 0      PHQ2-9    Flowsheet Row Counselor from 08/09/2022 in [facility] Office Visit from 06/10/2022 in [facility] Office Visit from 11/11/2021 in [facility] Video Visit from 11/05/2021 in [facility] Office Visit from 08/05/2021 in [facility]  PHQ-2 Total Score 5 0 0 0 0  PHQ-9 Total Score 15 0 0 -- --      Flowsheet Row Video Visit from 11/05/2021 in [facility] Pre-Admission Testing 60 from 09/29/2021 in [facility]GICAL DAY Video Visit from 06/12/2021 in [facility]  C-SSRS RISK CATEGORY No Risk No Risk Low Risk       Collaboration of Care: Collaboration of Care: Medication Management AEB ongoing medication management and Psychiatrist AEB established with this provider  Patient/Guardian was advised Release of Information must be obtained prior to any record release in order to collaborate their care with an outside provider. Patient/Guardian was advised if they have not already done so to contact the registration department to sign all necessary forms in  order for [place] to release information regarding their care.   Consent: Patient/Guardian gives verbal consent for treatment and assignment of benefits for services provided during this visit. Patient/Guardian expressed understanding and agreed to proceed.   Televisit via video: I connected with patient on 09/06/22 at  9:00 AM EDT by a video enabled telemedicine  application and verified that I am speaking with the correct person using two identifiers.  Location: Patient: home address in [place] Provider: remote office in [place]   I discussed the limitations of evaluation and management by telemedicine and the availability of in person appointments. The patient expressed understanding and agreed to proceed.  I discussed the assessment and treatment plan with the patient. The patient was provided an opportunity to ask questions and all were answered. The patient agreed with the plan and demonstrated an understanding of the instructions.   The patient was advised to call back or seek an in-person evaluation if the symptoms worsen or if the condition fails to improve as anticipated.  I provided 30 minutes of non-face-to-face time during this encounter.  Justn Quale A Margy Sumler 09/06/2022, 10:50 AM

## 2022-09-06 ENCOUNTER — Encounter (HOSPITAL_COMMUNITY): Payer: Self-pay | Admitting: Psychiatry

## 2022-09-06 ENCOUNTER — Other Ambulatory Visit: Payer: Self-pay

## 2022-09-06 ENCOUNTER — Telehealth (INDEPENDENT_AMBULATORY_CARE_PROVIDER_SITE_OTHER): Payer: No Payment, Other | Admitting: Psychiatry

## 2022-09-06 DIAGNOSIS — F5105 Insomnia due to other mental disorder: Secondary | ICD-10-CM | POA: Diagnosis not present

## 2022-09-06 DIAGNOSIS — F319 Bipolar disorder, unspecified: Secondary | ICD-10-CM | POA: Diagnosis not present

## 2022-09-06 DIAGNOSIS — F603 Borderline personality disorder: Secondary | ICD-10-CM | POA: Diagnosis not present

## 2022-09-06 DIAGNOSIS — F411 Generalized anxiety disorder: Secondary | ICD-10-CM

## 2022-09-06 DIAGNOSIS — F99 Mental disorder, not otherwise specified: Secondary | ICD-10-CM

## 2022-09-06 MED ORDER — LITHIUM CARBONATE 300 MG PO CAPS
600.0000 mg | ORAL_CAPSULE | Freq: Two times a day (BID) | ORAL | 1 refills | Status: DC
Start: 2022-09-06 — End: 2022-12-22
  Filled 2022-09-06: qty 360, 90d supply, fill #0
  Filled 2022-10-19: qty 120, 30d supply, fill #0
  Filled 2022-12-13: qty 120, 30d supply, fill #1

## 2022-09-06 MED ORDER — BUSPIRONE HCL 30 MG PO TABS
30.0000 mg | ORAL_TABLET | Freq: Two times a day (BID) | ORAL | 1 refills | Status: DC
Start: 2022-09-06 — End: 2022-12-22
  Filled 2022-09-06 – 2022-11-01 (×2): qty 180, 90d supply, fill #0

## 2022-09-06 MED ORDER — TRAZODONE HCL 150 MG PO TABS
375.0000 mg | ORAL_TABLET | Freq: Every day | ORAL | 1 refills | Status: DC
Start: 2022-09-06 — End: 2022-12-22
  Filled 2022-09-06: qty 225, 90d supply, fill #0
  Filled 2022-10-19: qty 75, 30d supply, fill #0
  Filled 2022-12-13: qty 75, 30d supply, fill #1

## 2022-09-06 MED ORDER — GABAPENTIN 100 MG PO CAPS
100.0000 mg | ORAL_CAPSULE | Freq: Three times a day (TID) | ORAL | 1 refills | Status: DC
Start: 2022-09-06 — End: 2023-03-25
  Filled 2022-09-06 – 2022-10-26 (×2): qty 270, 90d supply, fill #0

## 2022-09-06 NOTE — Patient Instructions (Signed)
Thank you for attending your appointment today.  -- We did not make any medication changes today. Please continue medications as prescribed.  Please do not make any changes to medications without first discussing with your provider. If you are experiencing a psychiatric emergency, please call 911 or present to your nearest emergency department. Additional crisis, medication management, and therapy resources are included below.  [facility]  [address], [place], [place] [address] [phone] WALK-IN URGENT CARE 24/7 FOR ANYONE [address], [place], [place]  [phone] Fax: [phone] [email or web address] *Interpreters available *Accepts all insurance and uninsured for Urgent Care needs *Accepts Medicaid and uninsured for outpatient treatment (below)      ONLY FOR [place] Residents  Below:    Outpatient New Patient Assessment/Therapy Walk-ins:        Monday -Thursday 8am until slots are full.        Every Friday 1pm-4pm  (first come, first served)                   New Patient Psychiatry/Medication Management        Monday-Friday 8am-11am (first come, first served)               For all walk-ins we ask that you arrive by 7:15am, because patients will be seen in the order of arrival.   

## 2022-09-08 ENCOUNTER — Ambulatory Visit (INDEPENDENT_AMBULATORY_CARE_PROVIDER_SITE_OTHER): Payer: No Payment, Other | Admitting: Mental Health

## 2022-09-08 ENCOUNTER — Encounter (HOSPITAL_COMMUNITY): Payer: Self-pay

## 2022-09-08 DIAGNOSIS — F411 Generalized anxiety disorder: Secondary | ICD-10-CM

## 2022-09-08 DIAGNOSIS — F603 Borderline personality disorder: Secondary | ICD-10-CM

## 2022-09-08 DIAGNOSIS — F319 Bipolar disorder, unspecified: Secondary | ICD-10-CM

## 2022-09-08 NOTE — Progress Notes (Signed)
THERAPIST PROGRESS NOTE Virtual Visit via Video Note  I connected with Valerie Reilly on 09/08/22 at  8:00 AM EDT by a video enabled telemedicine application and verified that I am speaking with the correct person using two identifiers.  Location: Patient: home address on file Provider: office   I discussed the limitations of evaluation and management by telemedicine and the availability of in person appointments. The patient expressed understanding and agreed to proceed.  I discussed the assessment and treatment plan with the patient. The patient was provided an opportunity to ask questions and all were answered. The patient agreed with the plan and demonstrated an understanding of the instructions.   The patient was advised to call back or seek an in-person evaluation if the symptoms worsen or if the condition fails to improve as anticipated.  I provided 55 minutes of non-face-to-face time during this encounter.   Dorris Singh, [facility]   Session Time: 8:04 am (55 minutes)  Participation Level: Active  Behavioral Response: CasualAlertAdequate  Type of Therapy: Individual Therapy  Treatment Goals addressed: STG: "Deal better." Valerie Reilly will increase management of moods AEB development of x 3 effective emotional regulation and distress tolerance skills with ability to effectively communicate thoughts per self report within the next 90 days   ProgressTowards Goals: Initial  Interventions: Supportive  Summary: Valerie Reilly is a 47 y.o. female who presents with dx of bipolar disorder, borderline personality disorder and generalized anxiety. Presents to session alert and oriented; mood and affect adequate. Speech clear and coherent at normal rate and tone. Initially distracted at start of session. Chief complaint of difficulty regulating emotions sharing for her mood to be contingent on status of relationship with partner. Notes thoughts of working relationship out and  shares with therapist benefits and drawbacks of current relationship. Shares plans for them to move in together and has to be out current home by the 22nd of this month. Shares difficulty getting along with daughter and better relationship; not living together. Shares over thinking behaviors and explored with therapist sxs and presence of mental health diagnoses. Engages with therapist to explore treatment goal. Agrees to work on increasing ability to effectively communicate with spouse and having regular temperature checks'. Denies SI/HI. Denies extreme highs or lows. Initial development of treatment plan.   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 Completed check in and assessed for current level of functioning, sxs management and stressors. Reviewed assessment information, bounds of confidentiality and informed consent. Provided safe space for Nelani to share thoughts and feelings in regards to current stressors. Provided supportive feedback and validated feelings. Engaged in education on borderline personality vs. Bipolar disorder and assessed for current sxs and stability in mood. Explored areas in need of progress and explored treatment plan. Engaged in exploring interpersonal effectiveness skills and ability to express emotions. Explored working to increase ability to control emotions. Encouraged working to set regular check ins with partner to support in increased communication. Reviewed session, assessed for safety and provided follow up.   Plan: Return again in  x 6 weeks.  Diagnosis: Bipolar I disorder (HCC)  Borderline personality disorder (HCC)  GAD (generalized anxiety disorder)  Collaboration of Care: Other None  Patient/Guardian was advised Release of Information must be obtained prior to any record release in order to collaborate their care with an outside provider. Patient/Guardian was advised if they have not  already done so to contact the registration department to sign all necessary  forms in order for [place] to release information regarding their care.   Consent: Patient/Guardian gives verbal consent for treatment and assignment of benefits for services provided during this visit. Patient/Guardian expressed understanding and agreed to proceed.   Stephan Minister [place], [facility] 09/08/2022

## 2022-09-13 ENCOUNTER — Other Ambulatory Visit: Payer: Self-pay | Admitting: Nurse Practitioner

## 2022-09-13 ENCOUNTER — Other Ambulatory Visit: Payer: Self-pay

## 2022-09-13 DIAGNOSIS — E119 Type 2 diabetes mellitus without complications: Secondary | ICD-10-CM

## 2022-09-13 MED ORDER — GLIPIZIDE ER 10 MG PO TB24
10.0000 mg | ORAL_TABLET | Freq: Every day | ORAL | 0 refills | Status: DC
Start: 2022-09-13 — End: 2022-12-13
  Filled 2022-09-13: qty 30, 30d supply, fill #0
  Filled 2022-10-11 (×2): qty 30, 30d supply, fill #1
  Filled 2022-11-12: qty 30, 30d supply, fill #2

## 2022-09-16 ENCOUNTER — Other Ambulatory Visit: Payer: Self-pay

## 2022-10-06 ENCOUNTER — Other Ambulatory Visit: Payer: Self-pay

## 2022-10-06 ENCOUNTER — Other Ambulatory Visit (HOSPITAL_COMMUNITY): Payer: Self-pay

## 2022-10-07 ENCOUNTER — Other Ambulatory Visit: Payer: Self-pay

## 2022-10-11 ENCOUNTER — Other Ambulatory Visit: Payer: Self-pay

## 2022-10-11 ENCOUNTER — Other Ambulatory Visit (HOSPITAL_COMMUNITY): Payer: Self-pay

## 2022-10-15 ENCOUNTER — Ambulatory Visit: Payer: No Typology Code available for payment source | Admitting: Critical Care Medicine

## 2022-10-15 ENCOUNTER — Encounter: Payer: Self-pay | Admitting: Critical Care Medicine

## 2022-10-15 ENCOUNTER — Other Ambulatory Visit: Payer: Self-pay

## 2022-10-15 ENCOUNTER — Other Ambulatory Visit (HOSPITAL_COMMUNITY)
Admission: RE | Admit: 2022-10-15 | Discharge: 2022-10-15 | Disposition: A | Discharge status: Home / Self Care | Payer: Self-pay | Source: Ambulatory Visit | Attending: Critical Care Medicine | Admitting: Critical Care Medicine

## 2022-10-15 ENCOUNTER — Ambulatory Visit: Payer: Self-pay | Attending: Critical Care Medicine | Admitting: Critical Care Medicine

## 2022-10-15 VITALS — BP 131/83 | HR 80 | Temp 98.8°F | Ht 67.0 in | Wt 221.8 lb

## 2022-10-15 DIAGNOSIS — Z7251 High risk heterosexual behavior: Secondary | ICD-10-CM | POA: Insufficient documentation

## 2022-10-15 DIAGNOSIS — G629 Polyneuropathy, unspecified: Secondary | ICD-10-CM

## 2022-10-15 DIAGNOSIS — I10 Essential (primary) hypertension: Secondary | ICD-10-CM

## 2022-10-15 DIAGNOSIS — Z794 Long term (current) use of insulin: Secondary | ICD-10-CM

## 2022-10-15 DIAGNOSIS — M5416 Radiculopathy, lumbar region: Secondary | ICD-10-CM

## 2022-10-15 DIAGNOSIS — E119 Type 2 diabetes mellitus without complications: Secondary | ICD-10-CM

## 2022-10-15 LAB — POCT URINALYSIS DIPSTICK
Bilirubin, UA: NEGATIVE
Glucose, UA: NEGATIVE
Ketones, UA: NEGATIVE
Nitrite, UA: NEGATIVE
Protein, UA: NEGATIVE
Spec Grav, UA: 1.015 (ref 1.010–1.025)
Urobilinogen, UA: 0.2 E.U./dL
pH, UA: 7 (ref 5.0–8.0)

## 2022-10-15 LAB — POCT GLYCOSYLATED HEMOGLOBIN (HGB A1C): Hemoglobin A1C: 5.8 % — AB (ref 4.0–5.6)

## 2022-10-15 MED ORDER — METHOCARBAMOL 500 MG PO TABS
500.0000 mg | ORAL_TABLET | Freq: Four times a day (QID) | ORAL | 1 refills | Status: DC | PRN
  Filled 2022-10-15: qty 60, 15d supply, fill #0
  Filled 2022-11-12: qty 60, 15d supply, fill #1

## 2022-10-15 MED ORDER — LIDOCAINE 5 % EX PTCH
1.0000 | MEDICATED_PATCH | CUTANEOUS | 0 refills | Status: AC
  Filled 2022-10-15: qty 30, 30d supply, fill #0

## 2022-10-15 NOTE — Assessment & Plan Note (Signed)
For lumbar symptoms we will give a lidocaine patch muscle relaxant and evaluate with routine x-ray

## 2022-10-15 NOTE — Progress Notes (Signed)
Established Patient Office Visit  Subjective   Patient ID: Valerie Reilly, female    DOB: [birth year]  Age: 47 y.o. MRN: [id number]  Chief Complaint  Patient presents with   Foot pain    Pt stated that her Rt foot and calf has been going numb x16month off/on. Also having lower back pain   STD testing    Pt would also like to get tested for STD    46 y.o.F pcp Valerie Reilly The patient is seen today as a work in and has been complaining of right foot and calf pain for a month and since that time has developed right lower back pain.  She also would like STD screening.  Patient seen previously in April as documented below per [facility]   06/08/22 primary hypertension -     CMP14+EGFR   benazepril (LOTENSIN) 20 MG tablet; Take 1 tablet (20 mg total) by mouth daily. Continue all antihypertensives as prescribed.  Reminded to bring in blood pressure log for follow  up appointment.  RECOMMENDATIONS: DASH/Mediterranean Diets are healthier choices for HTN.     Type 2 diabetes mellitus without complication, with long-term current use of insulin Continue blood sugar control as discussed in office today, low carbohydrate diet, and regular physical exercise as tolerated, 150 minutes per week (30 min each day, 5 days per week, or 50 min 3 days per week). Keep blood sugar logs with fasting goal of 90-130 mg/dl, post prandial (after you eat) less than 180.  For Hypoglycemia: BS <60 and Hyperglycemia BS >400; contact the clinic ASAP. Annual eye exams and foot exams are recommended.  -     Microalbumin / creatinine urine ratio -     Hemoglobin A1c -     glipiZIDE (GLUCOTROL XL) 10 MG 24 hr tablet; Take 1 tablet (10 mg total) by mouth daily with breakfast. -     metFORMIN (GLUCOPHAGE) 1000 MG tablet; Take 1 tablet (1,000 mg total) by mouth 2 (two) times daily with a meal.   Leukocytosis, unspecified type -     CBC with Differential   Hypercholesterolemia INSTRUCTIONS: Work on a low fat, heart healthy diet  and participate in regular aerobic exercise program by working out at least 150 minutes per week; 5 days a week-30 minutes per day. Avoid red meat/beef/steak,  fried foods. junk foods, sodas, sugary drinks, unhealthy snacking, alcohol and smoking.  Drink at least 80 oz of water per day and monitor your carbohydrate intake daily.   -     Lipid panel   Encounter for screening for HIV -     HIV antibody (with reflex)   High risk heterosexual behavior Requesting STD testing today.  -     HIV antibody (with reflex) -     Cervicovaginal ancillary only   Lithium use -     Lithium level -      Hypothyroidism, unspecified type Asymptomatic -     levothyroxine (SYNTHROID) 50 MCG tablet; Take 1 tablet (50 mcg total) by mouth daily before breakfast.     Gastroesophageal reflux disease without esophagitis INSTRUCTIONS: Avoid GERD Triggers: acidic, spicy or fried foods, caffeine, coffee, sodas,  alcohol and chocolate.   -     famotidine (PEPCID) 20 MG tablet; Take 1 tablet (20 mg total) by mouth daily.   Environmental and seasonal allergies -     fexofenadine (ALLEGRA) 180 MG tablet; Take 1 tablet (180 mg total) by mouth daily. -     fluticasone (FLONASE) 50  MCG/ACT nasal spray; Place 2 sprays into both nostrils daily.   Acquired hypothyroidism -     Thyroid Panel With TSH   Tobacco dependence -     nicotine (NICODERM CQ - DOSED IN MG/24 HOURS) 21 mg/24hr patch; Place 1 patch (21 mg total) onto the skin daily.         Review of Systems  Constitutional:  Negative for chills, diaphoresis, fever, malaise/fatigue and weight loss.  HENT:  Negative for congestion, hearing loss, nosebleeds, sore throat and tinnitus.   Eyes:  Negative for blurred vision, photophobia and redness.  Respiratory:  Negative for cough, hemoptysis, sputum production, shortness of breath, wheezing and stridor.   Cardiovascular:  Negative for chest pain, palpitations, orthopnea, claudication, leg swelling and PND.   Gastrointestinal:  Negative for abdominal pain, blood in stool, constipation, diarrhea, heartburn, nausea and vomiting.  Genitourinary:  Negative for dysuria, flank pain, frequency, hematuria and urgency.  Musculoskeletal:  Positive for back pain. Negative for falls, joint pain, myalgias and neck pain.  Skin:  Negative for itching and rash.  Neurological:  Positive for sensory change. Negative for dizziness, tingling, tremors, speech change, focal weakness, seizures, loss of consciousness, weakness and headaches.  Endo/Heme/Allergies:  Negative for environmental allergies and polydipsia. Does not bruise/bleed easily.  Psychiatric/Behavioral:  Negative for depression, memory loss, substance abuse and suicidal ideas. The patient is not nervous/anxious and does not have insomnia.       Objective:     BP 131/83   Pulse 80   Temp 98.8 F (37.1 C)   Ht 5\' 7"  (1.702 m)   Wt 221 lb 12.8 oz (100.6 kg)   SpO2 98%   BMI 34.74 kg/m    Physical Exam Vitals reviewed.  Constitutional:      Appearance: Normal appearance. She is well-developed. She is obese. She is not diaphoretic.  HENT:     Head: Normocephalic and atraumatic.     Nose: No nasal deformity, septal deviation, mucosal edema or rhinorrhea.     Right Sinus: No maxillary sinus tenderness or frontal sinus tenderness.     Left Sinus: No maxillary sinus tenderness or frontal sinus tenderness.     Mouth/Throat:     Pharynx: No oropharyngeal exudate.  Eyes:     General: Scleral icterus present.     Conjunctiva/sclera: Conjunctivae normal.     Pupils: Pupils are equal, round, and reactive to light.  Neck:     Thyroid: No thyromegaly.     Vascular: No carotid bruit or JVD.     Trachea: Trachea normal. No tracheal tenderness or tracheal deviation.  Cardiovascular:     Rate and Rhythm: Normal rate and regular rhythm.     Chest Wall: PMI is not displaced.     Pulses: Normal pulses. No decreased pulses.     Heart sounds: Normal heart  sounds, S1 normal and S2 normal. Heart sounds not distant. No murmur heard.    No systolic murmur is present.     No diastolic murmur is present.     No friction rub. No gallop. No S3 or S4 sounds.  Pulmonary:     Effort: No tachypnea, accessory muscle usage or respiratory distress.     Breath sounds: No stridor. No decreased breath sounds, wheezing, rhonchi or rales.  Chest:     Chest wall: No tenderness.  Abdominal:     General: Bowel sounds are normal. There is no distension.     Palpations: Abdomen is soft. Abdomen is not rigid.  Tenderness: There is no abdominal tenderness. There is no guarding or rebound.  Musculoskeletal:        General: Tenderness present. Normal range of motion.     Cervical back: Normal range of motion and neck supple. No edema, erythema or rigidity. No muscular tenderness. Normal range of motion.     Comments: Tender right lower lumbar area with muscle spasticity  Lymphadenopathy:     Head:     Right side of head: No submental or submandibular adenopathy.     Left side of head: No submental or submandibular adenopathy.     Cervical: No cervical adenopathy.  Skin:    General: Skin is warm and dry.     Coloration: Skin is not pale.     Findings: No rash.     Nails: There is no clubbing.  Neurological:     Mental Status: She is alert and oriented to person, place, and time.     Sensory: Sensory deficit present.     Comments: Decreased sensation over the entire right foot and 4/5 strength right leg 5/5 left leg  Psychiatric:        Speech: Speech normal.        Behavior: Behavior normal.      Results for orders placed or performed in visit on 10/15/22  POCT A1C  Result Value Ref Range   Hemoglobin A1C 5.8 (A) 4.0 - 5.6 %   HbA1c POC (<> result, manual entry)     HbA1c, POC (prediabetic range)     HbA1c, POC (controlled diabetic range)    Urinalysis Dipstick  Result Value Ref Range   Color, UA yellow    Clarity, UA cloudy    Glucose, UA  Negative Negative   Bilirubin, UA negative    Ketones, UA negative    Spec Grav, UA 1.015 1.010 - 1.025   Blood, UA Trace    pH, UA 7.0 5.0 - 8.0   Protein, UA Negative Negative   Urobilinogen, UA 0.2 0.2 or 1.0 E.U./dL   Nitrite, UA negative    Leukocytes, UA Trace (A) Negative   Appearance     Odor        The ASCVD Risk score (Arnett DK, et al., 2019) failed to calculate for the following reasons:   The valid total cholesterol range is 130 to 320 mg/dL    Assessment & Plan:   Problem List Items Addressed This Visit       Cardiovascular and Mediastinum   Essential hypertension    Controlled no medication change        Nervous and Auditory   Neuropathy    Symptoms compatible with neuropathy differential diagnosis is peripheral versus central due to lumbar disc disease  Will check labs for workup of peripheral neuropathy and continue with gabapentin and for low back pain has given back exercises and muscle relaxant and topical lidocaine patch      Relevant Orders   C-reactive protein   VITAMIN D 25 Hydroxy (Vit-D Deficiency, Fractures)   Vitamin B12   Folate   Lumbar back pain with radiculopathy affecting right lower extremity - Primary    For lumbar symptoms we will give a lidocaine patch muscle relaxant and evaluate with routine x-ray      Relevant Medications   methocarbamol (ROBAXIN) 500 MG tablet   Other Relevant Orders   DG Lumbar Spine Complete     Other   High risk heterosexual behavior    The patient's had multiple  partners now only has 1 no protection per her request have done a complete SDT screen this visit  Point-of-care urinalysis is normal no UTI      Relevant Orders   Cervicovaginal ancillary only   HIV Antibody (routine testing w rflx)   HIV4GL Save Tube   HIV antibody (with reflex)   Urinalysis Dipstick (Completed)   RPR   HCV Ab w Reflex to Quant PCR   Other Visit Diagnoses     Type 2 diabetes mellitus without complication, with  long-term current use of insulin (HCC)       Relevant Orders   POCT A1C (Completed)     30 minutes spent extra time needed for multisystem assessment  Return in about 4 weeks (around 11/12/2022) for nerve pain.    [name] Levans, MD

## 2022-10-15 NOTE — Assessment & Plan Note (Signed)
Controlled no medication change.

## 2022-10-15 NOTE — Assessment & Plan Note (Signed)
Symptoms compatible with neuropathy differential diagnosis is peripheral versus central due to lumbar disc disease  Will check labs for workup of peripheral neuropathy and continue with gabapentin and for low back pain has given back exercises and muscle relaxant and topical lidocaine patch

## 2022-10-15 NOTE — Assessment & Plan Note (Addendum)
The patient's had multiple partners now only has 1 no protection per her request have done a complete SDT screen this visit  Point-of-care urinalysis is normal no UTI

## 2022-10-15 NOTE — Patient Instructions (Signed)
Complete STD screening swab and labs Neuropathy screen labs Xray of lower back at [facility] Radiology walk in [address] Methocarbamol for back spasm and take ibuprofen for back pain Lidocaine patch 12 hours daily start at afternoon and leave on all night take off in AM Back exercises attached Return Valerie Reilly in 4 weeks may need neurology referral for nerve conduction testing

## 2022-10-18 ENCOUNTER — Other Ambulatory Visit: Payer: Self-pay | Admitting: Critical Care Medicine

## 2022-10-18 MED ORDER — METRONIDAZOLE 500 MG PO TABS
500.0000 mg | ORAL_TABLET | Freq: Two times a day (BID) | ORAL | 0 refills | Status: DC
  Filled 2022-10-18: qty 10, 5d supply, fill #0

## 2022-10-18 NOTE — Progress Notes (Signed)
Let pt know cerv vag study shows bacteria in vagina  I sent 5 day course of metronidazole

## 2022-10-19 ENCOUNTER — Telehealth: Payer: Self-pay

## 2022-10-19 ENCOUNTER — Other Ambulatory Visit: Payer: Self-pay

## 2022-10-19 MED ORDER — METRONIDAZOLE 500 MG PO TABS
500.0000 mg | ORAL_TABLET | Freq: Two times a day (BID) | ORAL | 0 refills | Status: AC
  Filled 2022-10-19 – 2022-10-20 (×2): qty 14, 7d supply, fill #0

## 2022-10-19 MED ORDER — FLUCONAZOLE 150 MG PO TABS
150.0000 mg | ORAL_TABLET | Freq: Once | ORAL | 0 refills | Status: AC
  Filled 2022-10-19: qty 1, 1d supply, fill #0

## 2022-10-19 NOTE — Telephone Encounter (Signed)
-----   Message from [name] Levans sent at 10/18/2022  6:52 PM EDT ----- Let pt know cerv vag study shows bacteria in vagina  I sent 5 day course of metronidazole

## 2022-10-19 NOTE — Telephone Encounter (Signed)
I recommend the 5 day course  I will send a dose of yeast medication in now

## 2022-10-19 NOTE — Addendum Note (Signed)
Addended by: [name] Levans E on: 10/19/2022 12:04 PM   Modules accepted: Orders

## 2022-10-19 NOTE — Telephone Encounter (Signed)
7d sent

## 2022-10-19 NOTE — Telephone Encounter (Signed)
Called patient and she is aware of note below, she stated that she wanted the 7 day dose

## 2022-10-19 NOTE — Telephone Encounter (Signed)
Pt was called and is aware of results, DOB was confirmed.  ?

## 2022-10-19 NOTE — Addendum Note (Signed)
Addended by: Storm Frisk on: 10/19/2022 04:54 PM   Modules accepted: Orders

## 2022-10-20 ENCOUNTER — Other Ambulatory Visit: Payer: Self-pay

## 2022-10-20 ENCOUNTER — Ambulatory Visit
Admission: RE | Admit: 2022-10-20 | Discharge: 2022-10-20 | Disposition: A | Discharge status: Home / Self Care | Payer: Self-pay | Source: Ambulatory Visit | Attending: Critical Care Medicine | Admitting: Critical Care Medicine

## 2022-10-20 ENCOUNTER — Other Ambulatory Visit (HOSPITAL_BASED_OUTPATIENT_CLINIC_OR_DEPARTMENT_OTHER): Payer: Self-pay

## 2022-10-20 DIAGNOSIS — M5416 Radiculopathy, lumbar region: Secondary | ICD-10-CM

## 2022-10-20 NOTE — Telephone Encounter (Signed)
Called patient and she is aware

## 2022-10-22 ENCOUNTER — Other Ambulatory Visit: Payer: Self-pay | Admitting: Critical Care Medicine

## 2022-10-22 ENCOUNTER — Telehealth: Payer: Self-pay

## 2022-10-22 DIAGNOSIS — M5416 Radiculopathy, lumbar region: Secondary | ICD-10-CM

## 2022-10-22 NOTE — Progress Notes (Signed)
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

## 2022-10-22 NOTE — Telephone Encounter (Signed)
Xray results by Dr. Delford Field given to pt 10/22/22. Pt asking if she needs to keep the upcoming appt with PCP since she is seeing a specialist regarding her back. Please advise pt. (September appt)

## 2022-10-22 NOTE — Telephone Encounter (Signed)
noted 

## 2022-10-25 ENCOUNTER — Telehealth: Payer: Self-pay

## 2022-10-25 NOTE — Telephone Encounter (Signed)
Stay on same medication as prescribed Keep ortho appt Zelda. Fyi

## 2022-10-25 NOTE — Telephone Encounter (Signed)
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

## 2022-10-25 NOTE — Telephone Encounter (Signed)
Pt was called and is aware of results, DOB was confirmed.  ?

## 2022-10-26 ENCOUNTER — Other Ambulatory Visit: Payer: Self-pay

## 2022-10-26 NOTE — Telephone Encounter (Signed)
FYI   Patient was a little upset when told instruction for pain. Patient felt we were saying just deal with, I told her again she needs to keep her ortho appointment, I told her they are specialist that work with this and will help you further.

## 2022-10-27 ENCOUNTER — Ambulatory Visit (INDEPENDENT_AMBULATORY_CARE_PROVIDER_SITE_OTHER): Payer: No Payment, Other | Admitting: Mental Health

## 2022-10-27 DIAGNOSIS — F603 Borderline personality disorder: Secondary | ICD-10-CM

## 2022-10-27 DIAGNOSIS — F411 Generalized anxiety disorder: Secondary | ICD-10-CM

## 2022-10-27 DIAGNOSIS — F319 Bipolar disorder, unspecified: Secondary | ICD-10-CM | POA: Diagnosis not present

## 2022-10-27 NOTE — Telephone Encounter (Signed)
Thanks [employer]

## 2022-10-27 NOTE — Progress Notes (Signed)
THERAPIST PROGRESS NOTE Virtual Visit via Video Note  I connected with Valerie Reilly on 10/27/22 at  9:00 AM EDT by a video enabled telemedicine application and verified that I am speaking with the correct person using two identifiers.  Location: Patient: home address on file Provider: office   I discussed the limitations of evaluation and management by telemedicine and the availability of in person appointments. The patient expressed understanding and agreed to proceed.  I discussed the assessment and treatment plan with the patient. The patient was provided an opportunity to ask questions and all were answered. The patient agreed with the plan and demonstrated an understanding of the instructions.   The patient was advised to call back or seek an in-person evaluation if the symptoms worsen or if the condition fails to improve as anticipated.  I provided 50 minutes of non-face-to-face time during this encounter.   Dorris Singh, [facility]   Session Time: 9:03 am ( 50 minutes)   Participation Level: Active  Behavioral Response: CasualAlertstable  Type of Therapy: Individual Therapy  Treatment Goals addressed: STG: "Deal better." Valerie Reilly will increase management of moods AEB development of x 3 effective emotional regulation and distress tolerance skills with ability to effectively communicate thoughts per self report within the next 90 days   ProgressTowards Goals: Progressing  Interventions: CBT and Supportive  Summary:  Valerie Reilly is a 47 y.o. female who presents with dx of bipolar disorder, borderline personality disorder and generalized anxiety. Presents to session alert and oriented; mood and affect adequate. Speech clear and coherent at normal rate and tone. Shares for moods to be stable and denies extreme highs or lows; medication compliant. Notes feelings of stress related to recently being diagnosed with pinched nerve and shares high degree of pain in the  mornings and frustration with not being prescribed anything to support. Notes to have decided to purchase home and took out mortgage with daughter and notes unclear of who she will be able to remain in home with daughter noting to believe she is a narcissist. Notes for her to be manipulative, selfish and "gives me her ass to kiss." Denies to feel as if she can communicate well with daughter and for her to not be able to compromise. Able to explore with therapist working to put in place firm boundaries with daughter and explore possible options of her ability to live independently. Shares for boyfriend and her relationship to going well however can be increasingly irritated with daughter which can bleed into her relationship. Explored with therapist working to engage in coping skills and relaxing activities to support in management of stressors in the home. Shares would like to engage in hobbies and will explore. Denies safety concerns. Progress with goals; sxs stable at this time.   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 Completed check in and assessed for current level of functioning, sxs management and stressors. Reviewed assessment information, bounds of confidentiality and informed consent. Provided safe space for Valerie Reilly to share thoughts and feelings in regard to reported stressors. Provided support and encouragement; validated feelings. Supported Horticulturist, commercial in processing current concerns for residing with daughter and explore interpersonal effectiveness skills and ability to engage in effective communication with daughter. Explores presence of boundaries and not engaging in power struggles with daughter. Explored thoughts on living with daughter best for her and options of ability to live independently. Explored presence of hobbies and things in which she enjoys to support as  means of coping. Reviewed session provided follow up and  assessed for safety.   Plan: Return again in  x 6 weeks.  Diagnosis: Bipolar I disorder (HCC)  Borderline personality disorder (HCC)  GAD (generalized anxiety disorder)  Collaboration of Care: Other None  Patient/Guardian was advised Release of Information must be obtained prior to any record release in order to collaborate their care with an outside provider. Patient/Guardian was advised if they have not already done so to contact the registration department to sign all necessary forms in order for [place] to release information regarding their care.   Consent: Patient/Guardian gives verbal consent for treatment and assignment of benefits for services provided during this visit. Patient/Guardian expressed understanding and agreed to proceed.   Stephan Minister [place], [facility] 10/27/2022

## 2022-10-28 ENCOUNTER — Other Ambulatory Visit: Payer: Self-pay

## 2022-11-01 ENCOUNTER — Other Ambulatory Visit: Payer: Self-pay

## 2022-11-02 ENCOUNTER — Ambulatory Visit (INDEPENDENT_AMBULATORY_CARE_PROVIDER_SITE_OTHER): Payer: Self-pay | Admitting: Orthopaedic Surgery

## 2022-11-02 ENCOUNTER — Other Ambulatory Visit: Payer: Self-pay

## 2022-11-02 ENCOUNTER — Encounter: Payer: Self-pay | Admitting: Orthopaedic Surgery

## 2022-11-02 DIAGNOSIS — M5416 Radiculopathy, lumbar region: Secondary | ICD-10-CM

## 2022-11-02 MED ORDER — PREDNISONE 10 MG PO TABS
ORAL_TABLET | ORAL | 3 refills | Status: AC
  Filled 2022-11-02: qty 21, 6d supply, fill #0

## 2022-11-02 NOTE — Progress Notes (Signed)
Office Visit Note   Patient: Valerie Reilly           Date of Birth: 11/11/1975           MRN: [id number] Visit Date: 11/02/2022              Requested by: Storm Frisk, MD [address] [place],  [place] [address] PCP: Claiborne Rigg, NP   Assessment & Plan: Visit Diagnoses:  1. Lumbar radiculopathy     Plan: Impression is 47 year old female with lumbar radiculopathy.  No focal motor deficits.  I will send in prescription for prednisone Dosepak with refills if needed.  I have also provided home exercises.  Follow-up if symptoms do not improve after 6 weeks.  Follow-Up Instructions: No follow-ups on file.   Orders:  No orders of the defined types were placed in this encounter.  Meds ordered this encounter  Medications   predniSONE (STERAPRED UNI-PAK 21 TAB) 10 MG (21) TBPK tablet    Sig: Take as directed    Dispense:  21 tablet    Refill:  3      Procedures: No procedures performed   Clinical Data: No additional findings.   Subjective: Chief Complaint  Patient presents with   Lower Back - Pain    HPI Patient is a 47 year old female who comes in for evaluation of right-sided buttock and low back pain with radiation of numbness and tingling to the foot and calf for a few months.  Denies any injuries.  She has been moving recently.  States that the symptoms are worse in the morning.  Denies any red flag symptoms.  Has been taking Robaxin and ibuprofen which were prescribed by her PCP. Review of Systems  Constitutional: Negative.   HENT: Negative.    Eyes: Negative.   Respiratory: Negative.    Cardiovascular: Negative.   Endocrine: Negative.   Musculoskeletal: Negative.   Neurological: Negative.   Hematological: Negative.   Psychiatric/Behavioral: Negative.    All other systems reviewed and are negative.    Objective: Vital Signs: There were no vitals taken for this visit.  Physical Exam Vitals and nursing note reviewed.   Constitutional:      Appearance: She is well-developed.  HENT:     Head: Atraumatic.     Nose: Nose normal.  Eyes:     Extraocular Movements: Extraocular movements intact.  Cardiovascular:     Pulses: Normal pulses.  Pulmonary:     Effort: Pulmonary effort is normal.  Abdominal:     Palpations: Abdomen is soft.  Musculoskeletal:     Cervical back: Neck supple.  Skin:    General: Skin is warm.     Capillary Refill: Capillary refill takes less than 2 seconds.  Neurological:     Mental Status: She is alert. Mental status is at baseline.  Psychiatric:        Behavior: Behavior normal.        Thought Content: Thought content normal.        Judgment: Judgment normal.     Ortho Exam Examination of the lumbar spine and right lower extremity shows no motor deficits.  She feels decree sensation to the calf and the top of the foot.  Normal reflexes. Specialty Comments:  No specialty comments available.  Imaging: No results found.   PMFS History: Patient Active Problem List   Diagnosis Date Noted   Neuropathy 10/15/2022   High risk heterosexual behavior 10/15/2022   Lumbar back  pain with radiculopathy affecting right lower extremity 10/15/2022   Calculus of gallbladder without cholecystitis without obstruction    Bipolar I disorder (HCC) 09/26/2019   GAD (generalized anxiety disorder) 09/26/2019   Insomnia due to other mental disorder 09/26/2019   Long term use of drug 01/04/2019   Postoperative visit 01/04/2019   Dyslipidemia 07/29/2018   Leukocytosis 04/06/2017   Uncontrolled type 2 diabetes mellitus with hyperglycemia (HCC) 08/18/2016   Cannabis abuse, in remission 08/15/2014   Acquired hypothyroidism 06/25/2014   Essential hypertension 06/25/2014   Borderline personality disorder (HCC) 04/30/2014   Allergic rhinitis 03/30/2012   GERD (gastroesophageal reflux disease) 03/30/2012   Psoriasis 03/30/2012   Vitamin D deficiency 03/30/2012   Past Medical History:   Diagnosis Date   Allergy    Anxiety    Asthma    Bipolar disorder (HCC)    Diabetes mellitus    Diabetes mellitus, type II (HCC)    Gallstone    GERD (gastroesophageal reflux disease)    History of borderline personality disorder    Hypercholesteremia    Hypertension    Obesity    Psoriasis    Psoriasis    Seasonal allergies    Thyroid disease     Family History  Problem Relation Age of Onset   Personality disorder Mother    Diabetes Mother    Diabetes Father    Alcohol abuse Maternal Uncle    Depression Maternal Uncle    Diabetes Maternal Grandfather    Breast cancer Neg Hx    Stomach cancer Neg Hx    Colon cancer Neg Hx    Esophageal cancer Neg Hx    Colon polyps Neg Hx    Crohn's disease Neg Hx    Ulcerative colitis Neg Hx    Rectal cancer Neg Hx     Past Surgical History:  Procedure Laterality Date   CHOLECYSTECTOMY N/A 10/02/2021   Procedure: LAPAROSCOPIC CHOLECYSTECTOMY;  Surgeon: Lewie Chamber, DO;  Location: [facility];  Service: General;  Laterality: N/A;   TONSILLECTOMY     TUBAL LIGATION     Social History   Occupational History   Occupation: delivery newspaper  Tobacco Use   Smoking status: Every Day    Current packs/day: 3.00    Average packs/day: 3.0 packs/day for 30.0 years (90.0 ttl pk-yrs)    Types: Cigarettes, E-cigarettes    Passive exposure: Current   Smokeless tobacco: Never  Vaping Use   Vaping status: Never Used  Substance and Sexual Activity   Alcohol use: Yes    Comment: occasional- a few times a year   Drug use: No    Types: Marijuana    Comment: last time was 3 yrs ago   Sexual activity: Yes    Birth control/protection: Surgical    Comment: Tubal ligation

## 2022-11-09 ENCOUNTER — Other Ambulatory Visit: Payer: Self-pay

## 2022-11-09 IMAGING — MG MM DIGITAL SCREENING BILAT W/ TOMO AND CAD
6 of 10 series · 6 of 30 positions shown · non-contrast
Comparison: Previous exam(s).

CLINICAL DATA: Screening.

EXAM:
DIGITAL SCREENING BILATERAL MAMMOGRAM WITH TOMOSYNTHESIS AND CAD
TECHNIQUE: Bilateral screening digital craniocaudal and mediolateral oblique
mammograms were obtained. Bilateral screening digital breast
tomosynthesis was performed. The images were evaluated with
computer-aided detection.

[R CC synth-2D]
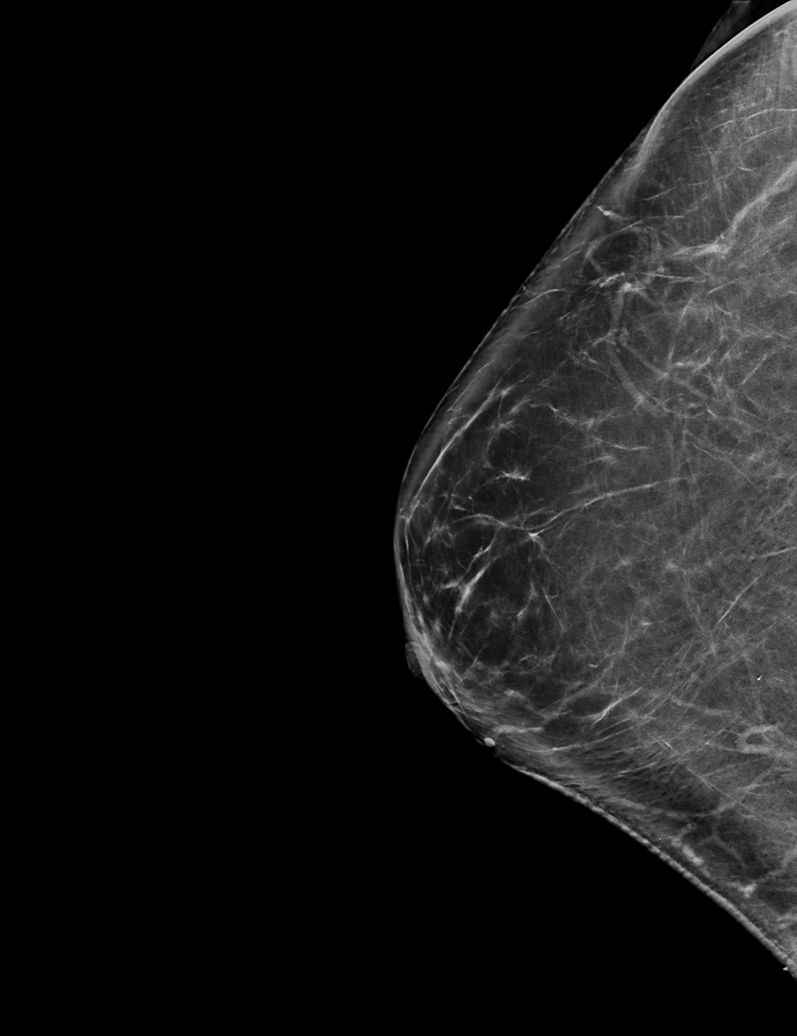

[L CC synth-2D (1 of 2)]
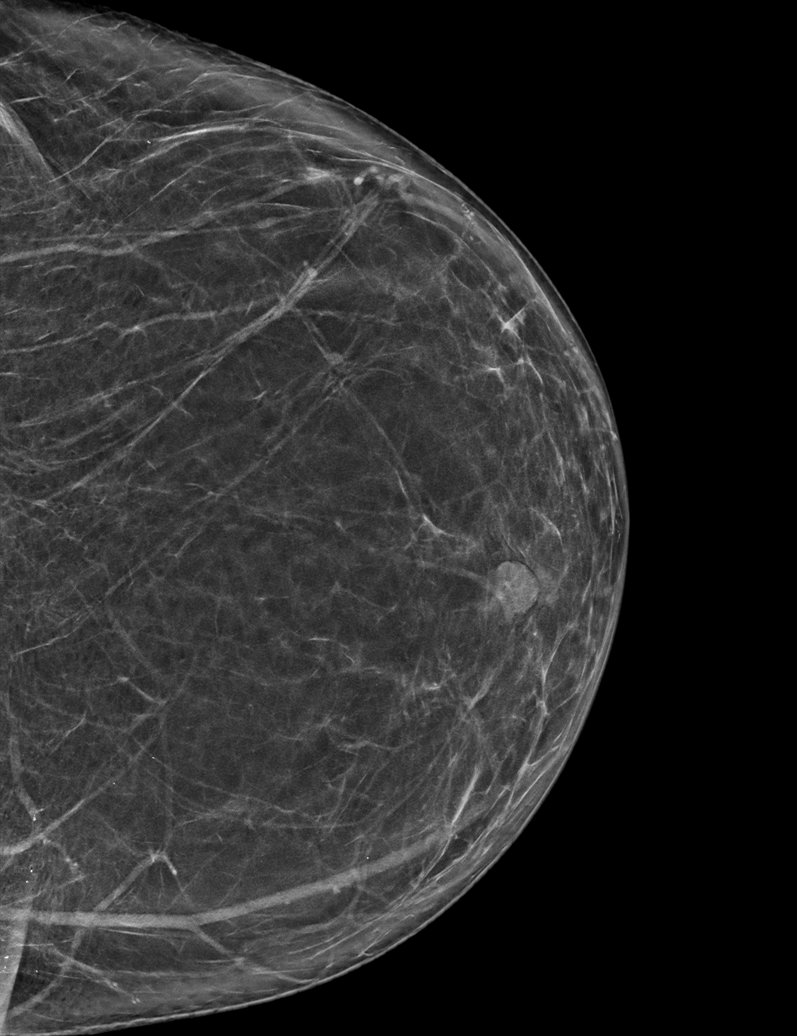

[L CC synth-2D (2 of 2)]
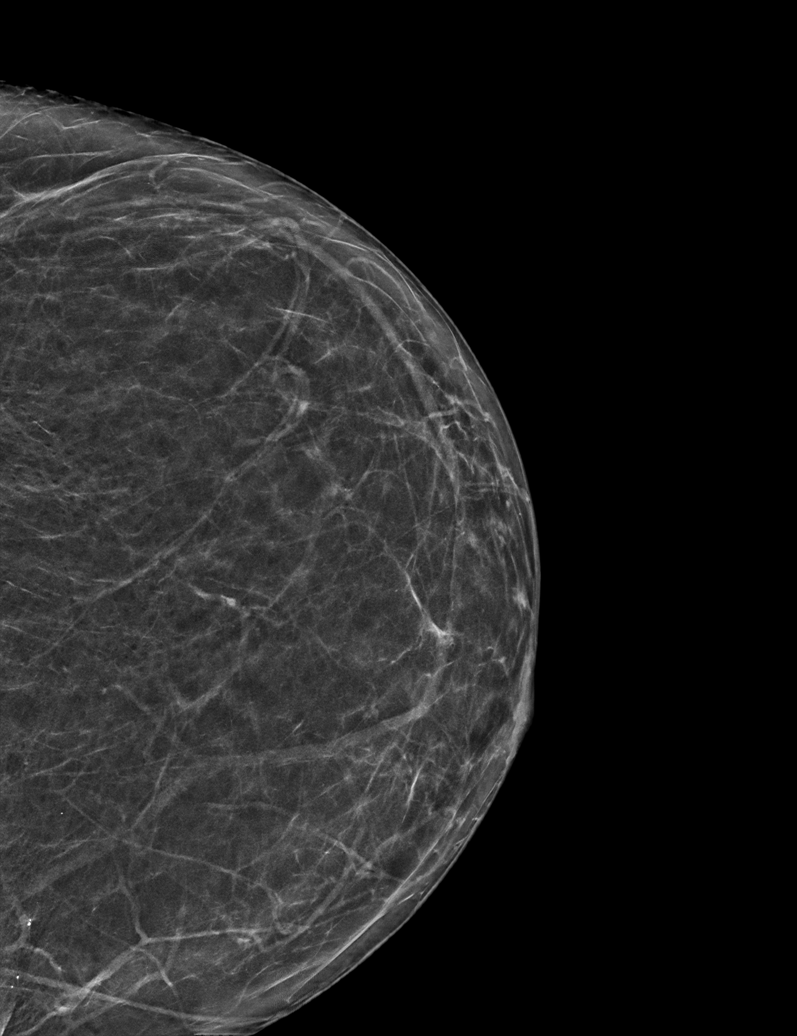

[L MLO synth-2D]
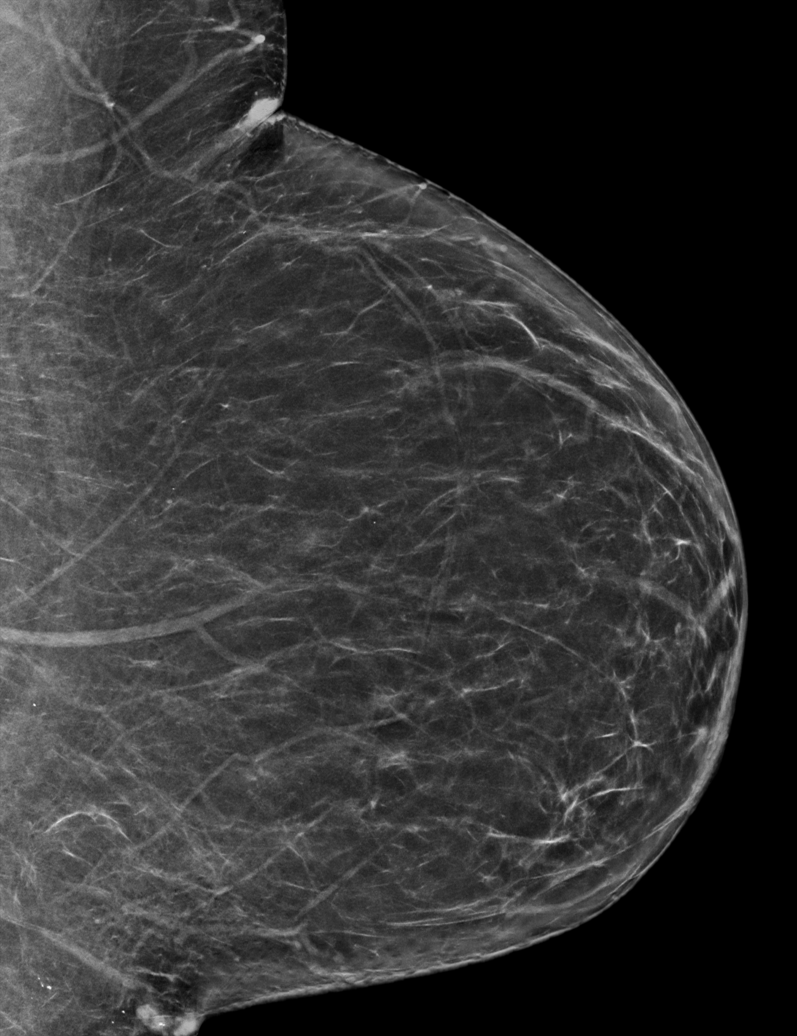

[R MLO synth-2D]
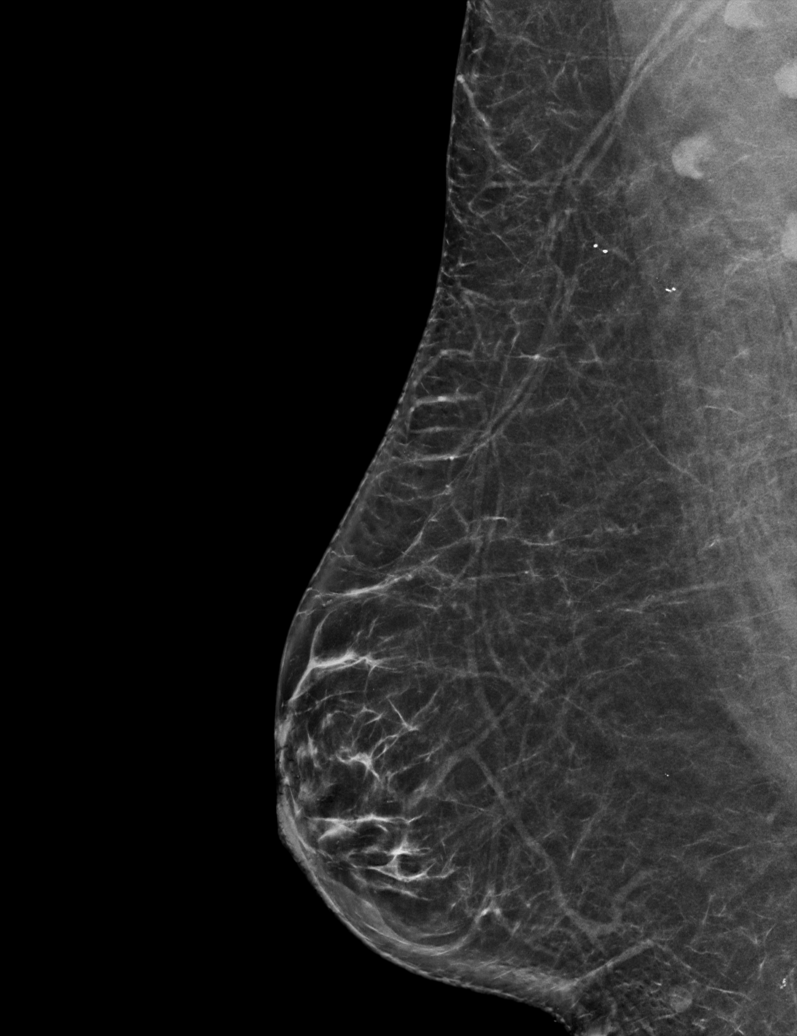

[L CC tomo · tomo slice 31/62.0]
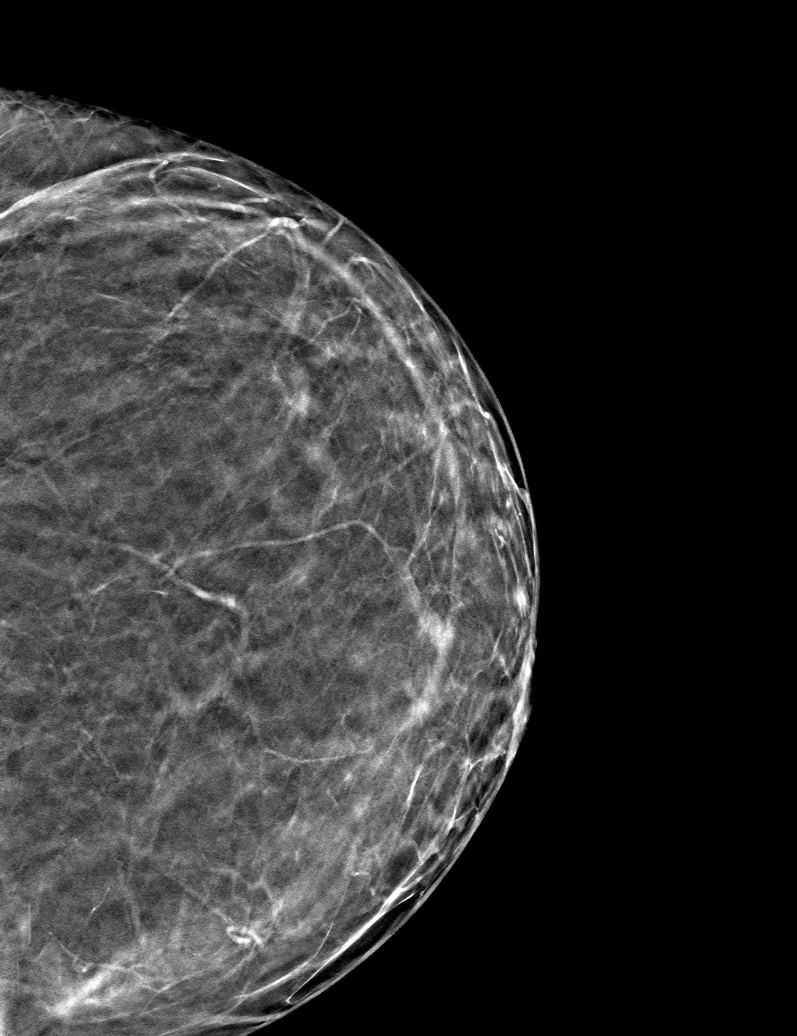

[6 of 30 positions shown; findings below may reference images not displayed]

ACR Breast Density Category b: There are scattered areas of
fibroglandular density.
FINDINGS: There are no findings suspicious for malignancy.
IMPRESSION: No mammographic evidence of malignancy. A result letter of this
screening mammogram will be mailed directly to the patient.

RECOMMENDATION:
Screening mammogram in one year. (Code:51-O-LD2)

BI-RADS CATEGORY  1: Negative.

## 2022-11-10 ENCOUNTER — Ambulatory Visit (INDEPENDENT_AMBULATORY_CARE_PROVIDER_SITE_OTHER): Payer: No Payment, Other | Admitting: Mental Health

## 2022-11-10 DIAGNOSIS — F603 Borderline personality disorder: Secondary | ICD-10-CM

## 2022-11-10 DIAGNOSIS — F319 Bipolar disorder, unspecified: Secondary | ICD-10-CM | POA: Diagnosis not present

## 2022-11-10 DIAGNOSIS — F411 Generalized anxiety disorder: Secondary | ICD-10-CM

## 2022-11-10 NOTE — Progress Notes (Signed)
THERAPIST PROGRESS NOTE Virtual Visit via Video Note  I connected with Nahara Laffey Brougham on 11/10/22 at  9:00 AM EDT by a video enabled telemedicine application and verified that I am speaking with the correct person using two identifiers.  Location: Patient: home address on file Provider: office   I discussed the limitations of evaluation and management by telemedicine and the availability of in person appointments. The patient expressed understanding and agreed to proceed.  I discussed the assessment and treatment plan with the patient. The patient was provided an opportunity to ask questions and all were answered. The patient agreed with the plan and demonstrated an understanding of the instructions.   The patient was advised to call back or seek an in-person evaluation if the symptoms worsen or if the condition fails to improve as anticipated.  I provided 50 minutes of non-face-to-face time during this encounter.   Dorris Singh, [facility]   Session Time: 9:03 am ( 50 minutes)  Participation Level: Active  Behavioral Response: CasualAlertStable  Type of Therapy: Individual Therapy  Treatment Goals addressed: STG: "Deal better." Seylah will increase management of moods AEB development of x 3 effective emotional regulation and distress tolerance skills with ability to effectively communicate thoughts per self report within the next 90 days   ProgressTowards Goals: Progressing  Interventions: CBT and Supportive  Summary: Sallyjo Vanderkolk Jirak is a 47 y.o. female who presents with dx of bipolar disorder, borderline personality disorder and generalized anxiety. Presents to session alert and oriented; mood and affect adequate. Speech clear and coherent at normal rate and tone. Shares for moods to be stable and shares concern for feelings of anxiety and overthinking in regards to current relationship. Shares difficulty managing overthinking behaviors and shares can quesetion  boyfriend. Shares history of feeling as if she can not trust him and is working to re-establish trust. [name] this can cause to feelings of irritability. Processes with therapist no evidence for thoughts of boyfriend doing something he is not supposed to do" I really don't think he is doin anything." Engaged with therapist with ability to work to challenge anxious thoughts and engaging in self-soothing for distress tolerance and providng time to process thoughts in balanced manner. Shares working to refrain from daughter and keep space from her and focus on her relationship. Denies SI/HI. Progress with goals; working to increased managing of anxiety and distress.    Suicidal/Homicidal: Nowithout intent/plan  Therapist Response:  Therapist engaged [employer] in [employer]. Completed check in and assessed for current level of functioning, sxs management and stressors. Provided safe space for Dorlisa to share thoughts and feelings in regard to reported stressors. Provided supportive feedback and educated on connection of thoughts feelings and behaviors. Discussed ability to work to identify and challenge anxious thoughts. Supported in Hotel manager of taking thoughts "to trial" and exploring alternative thoughts. Reviewed session and provided follow up. No safety concerns reported.   Plan: Return again in  x 6 weeks.  Diagnosis: Bipolar I disorder (HCC)  Borderline personality disorder (HCC)  GAD (generalized anxiety disorder)  Collaboration of Care: Other None  Patient/Guardian was advised Release of Information must be obtained prior to any record release in order to collaborate their care with an outside provider. Patient/Guardian was advised if they have not already done so to contact the registration department to sign all necessary forms in order for [place] to release information regarding their care.   Consent: Patient/Guardian gives verbal consent for treatment and assignment of  benefits for services provided during this visit. Patient/Guardian expressed understanding and agreed to proceed.   Stephan Minister [place], [facility] 11/10/2022

## 2022-11-12 ENCOUNTER — Other Ambulatory Visit: Payer: Self-pay | Admitting: Nurse Practitioner

## 2022-11-12 ENCOUNTER — Other Ambulatory Visit: Payer: Self-pay

## 2022-11-12 DIAGNOSIS — J3089 Other allergic rhinitis: Secondary | ICD-10-CM

## 2022-11-12 MED ORDER — FLUTICASONE PROPIONATE 50 MCG/ACT NA SUSP
2.0000 | Freq: Every day | NASAL | 2 refills | Status: DC
  Filled 2022-11-12: qty 16, 30d supply, fill #0
  Filled 2022-12-22: qty 16, 30d supply, fill #1
  Filled 2023-01-24: qty 16, 30d supply, fill #2

## 2022-11-12 NOTE — Telephone Encounter (Signed)
Requested Prescriptions  Pending Prescriptions Disp Refills   fluticasone (FLONASE) 50 MCG/ACT nasal spray 16 g 2    Sig: Place 2 sprays into both nostrils daily.     Ear, Nose, and Throat: Nasal Preparations - Corticosteroids Passed - 11/12/2022  9:17 AM      Passed - Valid encounter within last 12 months    Recent Outpatient Visits           4 weeks ago Lumbar back pain with radiculopathy affecting right lower extremity   [place] [facility] Storm Frisk, MD   5 months ago Primary hypertension   [place] [facility] Claiborne Rigg, NP   11 months ago Acute non-recurrent maxillary sinusitis   [place] [facility] [place], Odette Horns, MD   1 year ago Type 2 diabetes mellitus without complication, with long-term current use of insulin [facility])    [place] [facility] [place], [place] W, NP   1 year ago Type 2 diabetes mellitus without complication, with long-term current use of insulin [facility])    [facility] [place], Marzella Schlein, [place]       Future Appointments             In 1 month Claiborne Rigg, NP [facility]

## 2022-11-15 ENCOUNTER — Other Ambulatory Visit: Payer: Self-pay | Admitting: Nurse Practitioner

## 2022-11-15 ENCOUNTER — Other Ambulatory Visit: Payer: Self-pay

## 2022-11-15 DIAGNOSIS — E119 Type 2 diabetes mellitus without complications: Secondary | ICD-10-CM

## 2022-11-16 ENCOUNTER — Other Ambulatory Visit: Payer: Self-pay

## 2022-11-18 ENCOUNTER — Other Ambulatory Visit: Payer: Self-pay

## 2022-11-18 ENCOUNTER — Ambulatory Visit: Payer: Self-pay

## 2022-11-18 DIAGNOSIS — E119 Type 2 diabetes mellitus without complications: Secondary | ICD-10-CM

## 2022-11-18 MED ORDER — METFORMIN HCL 1000 MG PO TABS
1000.0000 mg | ORAL_TABLET | Freq: Two times a day (BID) | ORAL | 0 refills | Status: DC
Start: 2022-11-18 — End: 2023-02-21
  Filled 2022-11-18: qty 180, 90d supply, fill #0

## 2022-11-18 NOTE — Telephone Encounter (Signed)
Noted  

## 2022-11-18 NOTE — Telephone Encounter (Signed)
  Chief Complaint: Metformin denied Disposition: [] ED /[] Urgent Care (no appt availability in office) / [] Appointment(In office/virtual)/ []  [facility] [facility]/ [] Home Care/ [] Refused Recommended Disposition /[] [place] [facility]/ [x]  Follow-up with PCP Additional Notes: When med was reordered on 06/13/22 was sent to [facility] which pt no longer uses. Pharmacy removed from list. Pt stated she has only a few left. Called [facility] and phoned in 3 month worth. Pt frustrated that she has not used [facility] in several months. Apologized for the error and advised will phone in refill.Pt verbalized understanding.  Reason for Disposition  Caller with prescription and triager answers question  Answer Assessment - Initial Assessment Questions 1. DRUG NAME: "What medicine do you need to have refilled?"     Metformin 2. REFILLS REMAINING: "How many refills are remaining?" (Note: The label on the medicine or pill bottle will show how many refills are remaining. If there are no refills remaining, then a renewal may be needed.) N/a was called in  to wrong pharmacy  4. PRESCRIBING HCP: "Who prescribed it?" Reason: If prescribed by specialist, call should be referred to that group.     PCP  Protocols used: Medication Refill and Renewal Call-A-AH

## 2022-11-19 ENCOUNTER — Other Ambulatory Visit: Payer: Self-pay

## 2022-11-19 ENCOUNTER — Ambulatory Visit: Payer: No Typology Code available for payment source | Admitting: Nurse Practitioner

## 2022-11-26 ENCOUNTER — Other Ambulatory Visit: Payer: Self-pay

## 2022-11-27 ENCOUNTER — Encounter (HOSPITAL_COMMUNITY): Payer: Self-pay

## 2022-11-30 ENCOUNTER — Other Ambulatory Visit: Payer: Self-pay

## 2022-12-01 ENCOUNTER — Other Ambulatory Visit: Payer: Self-pay

## 2022-12-06 ENCOUNTER — Other Ambulatory Visit: Payer: Self-pay

## 2022-12-13 ENCOUNTER — Other Ambulatory Visit: Payer: Self-pay | Admitting: Nurse Practitioner

## 2022-12-13 ENCOUNTER — Other Ambulatory Visit: Payer: Self-pay

## 2022-12-13 DIAGNOSIS — Z794 Long term (current) use of insulin: Secondary | ICD-10-CM

## 2022-12-13 DIAGNOSIS — E7800 Pure hypercholesterolemia, unspecified: Secondary | ICD-10-CM

## 2022-12-13 MED ORDER — GLIPIZIDE ER 10 MG PO TB24
10.0000 mg | ORAL_TABLET | Freq: Every day | ORAL | 0 refills | Status: DC
  Filled 2022-12-13: qty 90, 90d supply, fill #0

## 2022-12-13 MED ORDER — SIMVASTATIN 40 MG PO TABS
40.0000 mg | ORAL_TABLET | Freq: Every day | ORAL | 0 refills | Status: DC
Start: 2022-12-13 — End: 2023-03-10
  Filled 2022-12-13: qty 90, 90d supply, fill #0

## 2022-12-15 ENCOUNTER — Ambulatory Visit: Payer: Self-pay | Attending: Nurse Practitioner | Admitting: Nurse Practitioner

## 2022-12-15 ENCOUNTER — Other Ambulatory Visit: Payer: Self-pay

## 2022-12-15 ENCOUNTER — Encounter: Payer: Self-pay | Admitting: Nurse Practitioner

## 2022-12-15 ENCOUNTER — Other Ambulatory Visit (HOSPITAL_COMMUNITY)
Admission: RE | Admit: 2022-12-15 | Discharge: 2022-12-15 | Disposition: A | Discharge status: Home / Self Care | Payer: Self-pay | Source: Ambulatory Visit | Attending: Nurse Practitioner | Admitting: Nurse Practitioner

## 2022-12-15 VITALS — BP 143/85 | HR 98 | Ht 67.0 in | Wt 224.6 lb

## 2022-12-15 DIAGNOSIS — Z7984 Long term (current) use of oral hypoglycemic drugs: Secondary | ICD-10-CM

## 2022-12-15 DIAGNOSIS — Z1211 Encounter for screening for malignant neoplasm of colon: Secondary | ICD-10-CM

## 2022-12-15 DIAGNOSIS — E039 Hypothyroidism, unspecified: Secondary | ICD-10-CM

## 2022-12-15 DIAGNOSIS — N760 Acute vaginitis: Secondary | ICD-10-CM

## 2022-12-15 DIAGNOSIS — D72829 Elevated white blood cell count, unspecified: Secondary | ICD-10-CM

## 2022-12-15 DIAGNOSIS — E1165 Type 2 diabetes mellitus with hyperglycemia: Secondary | ICD-10-CM

## 2022-12-15 DIAGNOSIS — Z23 Encounter for immunization: Secondary | ICD-10-CM

## 2022-12-15 DIAGNOSIS — E785 Hyperlipidemia, unspecified: Secondary | ICD-10-CM

## 2022-12-15 NOTE — Assessment & Plan Note (Addendum)
Patient's BP today 162/91. Patient says she is agitated due to an encounter with her daughter today. She normally takes her Benazepril around 9 AM. Will continue Benazepril 20 mg PO daily. Plan to obtain CMP for renal function

## 2022-12-15 NOTE — Assessment & Plan Note (Addendum)
Last A1C 5.8. Plan to continue Glipizide 10 PO daily and Metformin 1000 mg PO bid. Will also check A1C, CMET, urine microalbumin/creatinine ratio

## 2022-12-15 NOTE — Assessment & Plan Note (Signed)
Patient currently taking Simvastatin 40 mg PO daily. Plan to continue and check lipid panel.

## 2022-12-15 NOTE — Progress Notes (Signed)
Assessment & Plan:  Valerie Reilly was seen today for medical management of chronic issues.  Diagnoses and all orders for this visit:  Recurrent vaginitis -     Cervicovaginal ancillary only  Colon cancer screening -     Fecal occult blood, imunochemical([facility])  Dyslipidemia -     Lipid Panel  Uncontrolled type 2 diabetes mellitus with hyperglycemia (HCC) -     Microalbumin/Creatinine Ratio, Urine -     CMP14+EGFR -     Hemoglobin A1c  Leukocytosis, unspecified type -     CBC with Differential  Acquired hypothyroidism -     Thyroid Panel With TSH  Encounter for immunization -     Flu vaccine trivalent PF, 6mos and older(Flulaval,Afluria,Fluarix,Fluzone)    Patient has been counseled on age-appropriate routine health concerns for screening and prevention. These are reviewed and up-to-date. Referrals have been placed accordingly. Immunizations are up-to-date or declined.    Subjective:   Chief Complaint  Patient presents with   Medical Management of Chronic Issues   Valerie Reilly presents for follow up HTN, and T2DM.   She is requesting a vaginal swab today because she is "irritated". She has frequent re occurrences of bacterial vaginitis. In a monogamous relationship.   HTN Blood pressure is elevated today @162 /91. Patient says she is agitated this morning due to an issue with her daughter. She normally takes her benazepril around 9 AM.  BP Readings from Last 3 Encounters: 12/15/22 : (!) 143/85 10/15/22 : 131/83 07/29/22 : 110/78       Review of Systems  Constitutional: Negative.  Negative for fever, malaise/fatigue and weight loss.  HENT: Negative.  Negative for nosebleeds.   Eyes: Negative.  Negative for blurred vision, double vision and photophobia.  Respiratory: Negative.  Negative for cough and shortness of breath.   Cardiovascular: Negative.  Negative for chest pain, palpitations and leg swelling.  Gastrointestinal: Negative.  Negative for heartburn,  nausea and vomiting.  Genitourinary:        Vaginal irritation  Musculoskeletal: Negative.  Negative for myalgias.  Neurological: Negative.  Negative for dizziness, focal weakness, seizures and headaches.  Psychiatric/Behavioral: Negative.  Negative for suicidal ideas.     Past Medical History:  Diagnosis Date   Allergy    Anxiety    Asthma    Bipolar disorder (HCC)    Diabetes mellitus    Diabetes mellitus, type II (HCC)    Gallstone    GERD (gastroesophageal reflux disease)    History of borderline personality disorder    Hypercholesteremia    Hypertension    Obesity    Psoriasis    Psoriasis    Seasonal allergies    Thyroid disease     Past Surgical History:  Procedure Laterality Date   CHOLECYSTECTOMY N/A 10/02/2021   Procedure: LAPAROSCOPIC CHOLECYSTECTOMY;  Surgeon: Lewie Chamber, DO;  Location: [facility];  Service: General;  Laterality: N/A;   TONSILLECTOMY     TUBAL LIGATION      Family History  Problem Relation Age of Onset   Personality disorder Mother    Diabetes Mother    Diabetes Father    Alcohol abuse Maternal Uncle    Depression Maternal Uncle    Diabetes Maternal Grandfather    Breast cancer Neg Hx    Stomach cancer Neg Hx    Colon cancer Neg Hx    Esophageal cancer Neg Hx    Colon polyps Neg Hx    Crohn's disease Neg Hx    Ulcerative  colitis Neg Hx    Rectal cancer Neg Hx     Social History Reviewed with no changes to be made today.   Outpatient Medications Prior to Visit  Medication Sig Dispense Refill   albuterol (VENTOLIN HFA) 108 (90 Base) MCG/ACT inhaler Inhale 2 puffs into the lungs every 6 (six) hours as needed for wheezing or shortness of breath. 6.7 g 2   benazepril (LOTENSIN) 20 MG tablet Take 1 tablet (20 mg total) by mouth daily. 90 tablet 1   busPIRone (BUSPAR) 30 MG tablet Take 1 tablet (30 mg total) by mouth 2 (two) times daily. 180 tablet 1   cholecalciferol (VITAMIN D) 1000 UNITS tablet Take 1,000 Units by mouth  daily.     famotidine (PEPCID) 20 MG tablet Take 1 tablet (20 mg total) by mouth daily. 90 tablet 1   fexofenadine (ALLEGRA) 180 MG tablet Take 1 tablet (180 mg total) by mouth daily. 90 tablet 1   fluticasone (FLONASE) 50 MCG/ACT nasal spray Place 2 sprays into both nostrils daily. 16 g 2   gabapentin (NEURONTIN) 100 MG capsule Take 1 capsule (100 mg total) by mouth 3 (three) times daily. 270 capsule 1   glipiZIDE (GLUCOTROL XL) 10 MG 24 hr tablet Take 1 tablet (10 mg total) by mouth daily with breakfast. 90 tablet 0   guselkumab (TREMFYA) 100 MG/ML prefilled syringe Inject 1 syringe (100mg /75ml) into the skin every 8 weeks 1 mL 11   hydroxypropyl methylcellulose / hypromellose (ISOPTO TEARS / GONIOVISC) 2.5 % ophthalmic solution Place 1 drop into both eyes in the morning and at bedtime.     levothyroxine (SYNTHROID) 50 MCG tablet Take 1 tablet (50 mcg total) by mouth daily before breakfast. 90 tablet 1   lidocaine (LIDODERM) 5 % Place 1 patch onto the skin daily. Remove & Discard patch within 12 hours daily, to lower back 30 patch 0   lithium carbonate 300 MG capsule Take 2 capsules (600 mg total) by mouth in the morning and at bedtime. 360 capsule 1   metFORMIN (GLUCOPHAGE) 1000 MG tablet Take 1 tablet (1,000 mg total) by mouth 2 (two) times daily with a meal. 180 tablet 0   methocarbamol (ROBAXIN) 500 MG tablet Take 1 tablet (500 mg total) by mouth every 6 (six) hours as needed for muscle spasms. 60 tablet 1   Multiple Vitamin (MULTIVITAMIN) capsule Take 1 capsule by mouth daily.     nicotine (NICODERM CQ - DOSED IN MG/24 HOURS) 21 mg/24hr patch Place 1 patch (21 mg total) onto the skin daily. 42 patch 0   Probiotic Product (PROBIOTIC BLEND PO) Take 1 capsule by mouth daily.     simvastatin (ZOCOR) 40 MG tablet Take 1 tablet (40 mg total) by mouth daily. 90 tablet 0   traZODone (DESYREL) 150 MG tablet Take 2.5 tablets (375 mg total) by mouth at bedtime. 225 tablet 1   No facility-administered  medications prior to visit.    No Known Allergies     Objective:    BP (!) 143/85   Pulse 98   Ht 5\' 7"  (1.702 m)   Wt 224 lb 9.6 oz (101.9 kg)   LMP 04/08/2022 (Approximate)   SpO2 98%   BMI 35.18 kg/m  Wt Readings from Last 3 Encounters:  12/15/22 224 lb 9.6 oz (101.9 kg)  10/15/22 221 lb 12.8 oz (100.6 kg)  07/29/22 222 lb (100.7 kg)    Physical Exam Vitals and nursing note reviewed.  Constitutional:  Appearance: She is well-developed. She is obese.  HENT:     Head: Normocephalic and atraumatic.  Cardiovascular:     Rate and Rhythm: Normal rate and regular rhythm.     Pulses: Normal pulses.     Heart sounds: Normal heart sounds. No murmur heard.    No friction rub. No gallop.  Pulmonary:     Effort: Pulmonary effort is normal. No tachypnea or respiratory distress.     Breath sounds: Normal breath sounds. No decreased breath sounds, wheezing, rhonchi or rales.  Chest:     Chest wall: No tenderness.  Abdominal:     General: Bowel sounds are normal.     Palpations: Abdomen is soft.  Genitourinary:    Comments: Self swab Musculoskeletal:        General: Normal range of motion.     Cervical back: Normal range of motion.  Skin:    General: Skin is warm and dry.  Neurological:     Mental Status: She is alert and oriented to person, place, and time.     Coordination: Coordination normal.  Psychiatric:        Mood and Affect: Mood normal.        Behavior: Behavior normal. Behavior is cooperative.        Thought Content: Thought content normal.        Judgment: Judgment normal.          Patient has been counseled extensively about nutrition and exercise as well as the importance of adherence with medications and regular follow-up. The patient was given clear instructions to go to ER or return to medical center if symptoms don't improve, worsen or new problems develop. The patient verbalized understanding.   Follow-up: Return in about 6 months (around  06/15/2023).    I have seen and examined this patient with the Family Nurse Practitioner Hosie Poisson and agree with the above note .  Claiborne Rigg, FNP-BC [facility] [place], [place] [phone]   12/15/2022, 5:42 PM

## 2022-12-15 NOTE — Assessment & Plan Note (Signed)
Patient has hx of leukocytosis. Will obtain cbc w/ differential to monitor

## 2022-12-15 NOTE — Assessment & Plan Note (Signed)
Currently taking Levothyroxine 50 mcg daily. Will continue medication and check thyroid panel

## 2022-12-15 NOTE — Patient Instructions (Addendum)
For multiple disease recurrences, 500 mg twice daily for 7 days in combination with or prior to a multiweek course of boric acid, followed by suppressive topical therapy.

## 2022-12-16 ENCOUNTER — Other Ambulatory Visit: Payer: Self-pay

## 2022-12-16 LAB — CBC WITH DIFFERENTIAL/PLATELET
Basophils Absolute: 0.1 10*3/uL (ref 0.0–0.2)
Basos: 1 %
EOS (ABSOLUTE): 0.4 10*3/uL (ref 0.0–0.4)
Eos: 4 %
Hematocrit: 46.9 % — ABNORMAL HIGH (ref 34.0–46.6)
Hemoglobin: 15.5 g/dL (ref 11.1–15.9)
Immature Grans (Abs): 0 10*3/uL (ref 0.0–0.1)
Immature Granulocytes: 0 %
Lymphocytes Absolute: 3 10*3/uL (ref 0.7–3.1)
Lymphs: 27 %
MCH: 31.7 pg (ref 26.6–33.0)
MCHC: 33 g/dL (ref 31.5–35.7)
MCV: 96 fL (ref 79–97)
Monocytes Absolute: 0.8 10*3/uL (ref 0.1–0.9)
Monocytes: 7 %
Neutrophils Absolute: 6.9 10*3/uL (ref 1.4–7.0)
Neutrophils: 61 %
Platelets: 312 10*3/uL (ref 150–450)
RBC: 4.89 x10E6/uL (ref 3.77–5.28)
RDW: 12.5 % (ref 11.7–15.4)
WBC: 11.2 10*3/uL — ABNORMAL HIGH (ref 3.4–10.8)

## 2022-12-16 LAB — MICROALBUMIN / CREATININE URINE RATIO
Creatinine, Urine: 99 mg/dL
Microalb/Creat Ratio: 9 mg/g{creat} (ref 0–29)
Microalbumin, Urine: 9 ug/mL

## 2022-12-16 LAB — THYROID PANEL WITH TSH
Free Thyroxine Index: 3 (ref 1.2–4.9)
T3 Uptake Ratio: 31 % (ref 24–39)
T4, Total: 9.6 ug/dL (ref 4.5–12.0)
TSH: 1.2 u[IU]/mL (ref 0.450–4.500)

## 2022-12-16 LAB — LIPID PANEL
Chol/HDL Ratio: 3.3 {ratio} (ref 0.0–4.4)
Cholesterol, Total: 135 mg/dL (ref 100–199)
HDL: 41 mg/dL (ref >39)
LDL Chol Calc (NIH): 74 mg/dL (ref 0–99)
Triglycerides: 109 mg/dL (ref 0–149)
VLDL Cholesterol Cal: 20 mg/dL (ref 5–40)

## 2022-12-16 LAB — CMP14+EGFR
ALT: 17 [IU]/L (ref 0–32)
AST: 18 [IU]/L (ref 0–40)
Albumin: 4.5 g/dL (ref 3.9–4.9)
Alkaline Phosphatase: 74 [IU]/L (ref 44–121)
BUN/Creatinine Ratio: 16 (ref 9–23)
BUN: 13 mg/dL (ref 6–24)
Bilirubin Total: <0.2 mg/dL (ref 0.0–1.2)
CO2: 21 mmol/L (ref 20–29)
Calcium: 10.6 mg/dL — ABNORMAL HIGH (ref 8.7–10.2)
Chloride: 104 mmol/L (ref 96–106)
Creatinine, Ser: 0.81 mg/dL (ref 0.57–1.00)
Globulin, Total: 2.4 g/dL (ref 1.5–4.5)
Glucose: 119 mg/dL — ABNORMAL HIGH (ref 70–99)
Potassium: 4.6 mmol/L (ref 3.5–5.2)
Sodium: 140 mmol/L (ref 134–144)
Total Protein: 6.9 g/dL (ref 6.0–8.5)
eGFR: 89 mL/min/{1.73_m2} (ref >59)

## 2022-12-16 LAB — HEMOGLOBIN A1C
Est. average glucose Bld gHb Est-mCnc: 123 mg/dL
Hgb A1c MFr Bld: 5.9 % — ABNORMAL HIGH (ref 4.8–5.6)

## 2022-12-16 LAB — CERVICOVAGINAL ANCILLARY ONLY
Bacterial Vaginitis (gardnerella): POSITIVE — AB
Candida Glabrata: NEGATIVE
Candida Vaginitis: NEGATIVE
Chlamydia: NEGATIVE
Comment: NEGATIVE
Comment: NEGATIVE
Comment: NEGATIVE
Comment: NEGATIVE
Comment: NEGATIVE
Comment: NORMAL
Neisseria Gonorrhea: NEGATIVE
Trichomonas: NEGATIVE

## 2022-12-17 ENCOUNTER — Other Ambulatory Visit: Payer: Self-pay

## 2022-12-20 ENCOUNTER — Other Ambulatory Visit: Payer: Self-pay

## 2022-12-20 NOTE — Progress Notes (Unsigned)
BH MD Outpatient Progress Note  12/22/2022 9:21 AM Valerie Reilly  MRN:  [id number]  Assessment:  Valerie Reilly presents for follow-up evaluation. Today, 12/22/22, patient reports continued psychiatric stability on below long-term regimen. She identifies significant benefit from psychotherapy for processing through interpersonal stressors. Denies SI or self harm urges/behaviors. No changes to plan of care at this time.  RTC in 3 months by video.  Identifying Information: Valerie Reilly is a 47 y.o. female with a history of bipolar 1 disorder, borderline personality disorder, GAD, T2DM, hypothyroidism on Synthroid, HTN, and HLD who is an established patient with [facility] participating in follow-up via video conferencing.   Plan:  # Historical diagnosis of bipolar 1 disorder  GAD # Borderline personality disorder Past medication trials: unknown Status of problem: stable Interventions: -- Continue lithium 600 mg BID (patient prefers 300 mg capsules as currently rx) -- Continue buspirone 30 mg 2 times daily -- Continue gabapentin 100 mg TID -- Continue individual psychotherapy with Valerie Reilly [facility]  # Sleep regulation Past medication trials: unknown Status of problem: stable Interventions: -- Continue trazodone 375 mg at bedtime   # High risk medication use Interventions: -- Lithium:  -- Kidney function within normal limits 06/10/2022  -- TSH within normal limits 06/10/2022  -- Lithium level 0.5 06/10/2022  Patient was given contact information for behavioral health clinic and was instructed to call 911 for emergencies.   Subjective:  Chief Complaint:  Chief Complaint  Patient presents with   Medication Management    Interval History:  [name] reports she is enjoying her new place and is in the process of buying it with her daughter. Describes mood as "okay" - notes some ups and downs related to relationship with boyfriend. Has found it  helpful to talk through this with therapist. Reports she can often overthink and has difficulty trusting.  Reports compliance with current psychotropics and feels they are working well. Denies SI or self harm urges. Sleeping "great" with current medications. Denies feeling overly sedated or dizzy from current dose of trazodone. Work is going well and has almost been there a year now.   Reports smoking less from before as she is no longer smoking in the house - about 2 ppd. Remains precontemplative at this time about further reduction; does have patches at home she can use if desired.  Visit Diagnosis:    ICD-10-CM   1. Borderline personality disorder (HCC)  F60.3     2. Bipolar I disorder (HCC)  F31.9 lithium carbonate 300 MG capsule    traZODone (DESYREL) 150 MG tablet    3. GAD (generalized anxiety disorder)  F41.1 busPIRone (BUSPAR) 30 MG tablet    4. Insomnia due to other mental disorder  F51.05 traZODone (DESYREL) 150 MG tablet   F99      Past Psychiatric History:  Diagnoses: Historical diagnosis of bipolar 1 disorder, borderline personality disorder, cannabis use in remission Medication trials: Has been on current medication regimen since at least 2016 with noted benefit Hospitalizations: multiple - last hospitalization at [facility] in 2014 for episode of mania Suicide attempt: yes - many years ago; hx of 2-3 previous attempts  Substance use:   -- Etoh: denies  -- Denies use of cannabis or other illicit drugs  -- Tobacco: 2 ppd; precontemplative regarding cessation  Past Medical History:  Past Medical History:  Diagnosis Date   Allergy    Anxiety    Asthma    Bipolar disorder (HCC)  Diabetes mellitus    Diabetes mellitus, type II (HCC)    Gallstone    GERD (gastroesophageal reflux disease)    History of borderline personality disorder    Hypercholesteremia    Hypertension    Obesity    Psoriasis    Psoriasis    Seasonal allergies    Thyroid disease     Past  Surgical History:  Procedure Laterality Date   CHOLECYSTECTOMY N/A 10/02/2021   Procedure: LAPAROSCOPIC CHOLECYSTECTOMY;  Surgeon: Lewie Chamber, DO;  Location: [facility];  Service: General;  Laterality: N/A;   TONSILLECTOMY     TUBAL LIGATION      Family Psychiatric History:  Maternal uncle: depression, alcohol use Mother: personality disorder  Family History:  Family History  Problem Relation Age of Onset   Personality disorder Mother    Diabetes Mother    Diabetes Father    Alcohol abuse Maternal Uncle    Depression Maternal Uncle    Diabetes Maternal Grandfather    Breast cancer Neg Hx    Stomach cancer Neg Hx    Colon cancer Neg Hx    Esophageal cancer Neg Hx    Colon polyps Neg Hx    Crohn's disease Neg Hx    Ulcerative colitis Neg Hx    Rectal cancer Neg Hx     Social History:  Social History   Socioeconomic History   Marital status: Divorced    Spouse name: Not on file   Number of children: 1   Years of education: Not on file   Highest education level: Associate degree: occupational, Scientist, product/process development, or vocational program  Occupational History   Occupation: delivery newspaper  Tobacco Use   Smoking status: Every Day    Current packs/day: 3.00    Average packs/day: 3.0 packs/day for 30.0 years (90.0 ttl pk-yrs)    Types: Cigarettes    Passive exposure: Current   Smokeless tobacco: Never  Vaping Use   Vaping status: Never Used  Substance and Sexual Activity   Alcohol use: Yes    Comment: occasional- a few times a year   Drug use: No    Types: Marijuana    Comment: last time was 3 yrs ago   Sexual activity: Yes    Birth control/protection: Surgical    Comment: Tubal ligation  Other Topics Concern   Not on file  Social History Narrative   Not on file   Social Determinants of Health   Financial Resource Strain: Low Risk  (12/15/2022)   Overall Financial Resource Strain (CARDIA)    Difficulty of Paying Living Expenses: Not hard at all  Food  Insecurity: No Food Insecurity (12/15/2022)   Hunger Vital Sign    Worried About Running Out of Food in the Last Year: Never true    Ran Out of Food in the Last Year: Never true  Transportation Needs: No Transportation Needs (12/15/2022)   PRAPARE - Administrator, Civil Service (Medical): No    Lack of Transportation (Non-Medical): No  Physical Activity: Inactive (12/15/2022)   Exercise Vital Sign    Days of Exercise per Week: 0 days    Minutes of Exercise per Session: 0 min  Stress: No Stress Concern Present (12/15/2022)   [employer] of Occupational Health - Occupational Stress Questionnaire    Feeling of Stress : Not at all  Social Connections: Socially Isolated (12/15/2022)   Social Connection and Isolation Panel [NHANES]    Frequency of Communication with Friends and Family: More than  three times a week    Frequency of Social Gatherings with Friends and Family: Once a week    Attends Religious Services: Never    Database administrator or Organizations: No    Attends Engineer, structural: Never    Marital Status: Divorced    Allergies: No Known Allergies  Current Medications: Current Outpatient Medications  Medication Sig Dispense Refill   gabapentin (NEURONTIN) 100 MG capsule Take 1 capsule (100 mg total) by mouth 3 (three) times daily. 270 capsule 1   albuterol (VENTOLIN HFA) 108 (90 Base) MCG/ACT inhaler Inhale 2 puffs into the lungs every 6 (six) hours as needed for wheezing or shortness of breath. 6.7 g 2   benazepril (LOTENSIN) 20 MG tablet Take 1 tablet (20 mg total) by mouth daily. 90 tablet 1   busPIRone (BUSPAR) 30 MG tablet Take 1 tablet (30 mg total) by mouth 2 (two) times daily. 180 tablet 1   cholecalciferol (VITAMIN D) 1000 UNITS tablet Take 1,000 Units by mouth daily.     famotidine (PEPCID) 20 MG tablet Take 1 tablet (20 mg total) by mouth daily. 90 tablet 1   fexofenadine (ALLEGRA) 180 MG tablet Take 1 tablet (180 mg total) by mouth  daily. 90 tablet 1   fluticasone (FLONASE) 50 MCG/ACT nasal spray Place 2 sprays into both nostrils daily. 16 g 2   glipiZIDE (GLUCOTROL XL) 10 MG 24 hr tablet Take 1 tablet (10 mg total) by mouth daily with breakfast. 90 tablet 0   guselkumab (TREMFYA) 100 MG/ML prefilled syringe Inject 1 syringe (100mg /46ml) into the skin every 8 weeks 1 mL 11   hydroxypropyl methylcellulose / hypromellose (ISOPTO TEARS / GONIOVISC) 2.5 % ophthalmic solution Place 1 drop into both eyes in the morning and at bedtime.     levothyroxine (SYNTHROID) 50 MCG tablet Take 1 tablet (50 mcg total) by mouth daily before breakfast. 90 tablet 1   lidocaine (LIDODERM) 5 % Place 1 patch onto the skin daily. Remove & Discard patch within 12 hours daily, to lower back 30 patch 0   lithium carbonate 300 MG capsule Take 2 capsules (600 mg total) by mouth in the morning and at bedtime. 360 capsule 1   metFORMIN (GLUCOPHAGE) 1000 MG tablet Take 1 tablet (1,000 mg total) by mouth 2 (two) times daily with a meal. 180 tablet 0   methocarbamol (ROBAXIN) 500 MG tablet Take 1 tablet (500 mg total) by mouth every 6 (six) hours as needed for muscle spasms. 60 tablet 1   Multiple Vitamin (MULTIVITAMIN) capsule Take 1 capsule by mouth daily.     nicotine (NICODERM CQ - DOSED IN MG/24 HOURS) 21 mg/24hr patch Place 1 patch (21 mg total) onto the skin daily. 42 patch 0   Probiotic Product (PROBIOTIC BLEND PO) Take 1 capsule by mouth daily.     simvastatin (ZOCOR) 40 MG tablet Take 1 tablet (40 mg total) by mouth daily. 90 tablet 0   traZODone (DESYREL) 150 MG tablet Take 2.5 tablets (375 mg total) by mouth at bedtime. 225 tablet 1   No current facility-administered medications for this visit.    ROS: Denies any physical complaints  Objective:  Psychiatric Specialty Exam: Last menstrual period 04/08/2022.There is no height or weight on file to calculate BMI.  General Appearance: Casual and Fairly Groomed  Eye Contact:  Good  Speech:   Clear and Coherent and Normal Rate  Volume:  Normal  Mood:   "okay"  Affect:   Euthymic; calm  Thought Content:  Denies AVH; IOR; paranoia    Suicidal Thoughts:  No  Homicidal Thoughts:  No  Thought Process:  Goal Directed and Linear  Orientation:  Full (Time, Place, and Person)    Memory:   Grossly oriented  Judgment:  Good  Insight:  Good  Concentration:  Concentration: Good  Recall:  NA  Fund of Knowledge: Good  Language: Good  Psychomotor Activity:  Normal  Akathisia:  NA  AIMS (if indicated): not done  Assets:  Communication Skills Desire for Improvement Housing Intimacy Leisure Time Social Support Vocational/Educational  ADL's:  Intact  Cognition: WNL  Sleep:  Good   PE: General: sits comfortably in view of camera; no acute distress  Pulm: no increased work of breathing on room air  MSK: all extremity movements appear intact  Neuro: no focal neurological deficits observed  Gait & Station: unable to assess by video    Metabolic Disorder Labs: Lab Results  Component Value Date   HGBA1C 5.9 (H) 12/15/2022   No results found for: "PROLACTIN" Lab Results  Component Value Date   CHOL 135 12/15/2022   TRIG 109 12/15/2022   HDL 41 12/15/2022   CHOLHDL 3.3 12/15/2022   LDLCALC 74 12/15/2022   LDLCALC 54 06/10/2022   Lab Results  Component Value Date   TSH 1.200 12/15/2022   TSH 1.690 06/10/2022    Therapeutic Level Labs: Lab Results  Component Value Date   LITHIUM 0.5 06/10/2022   LITHIUM 0.8 10/13/2020   No results found for: "VALPROATE" No results found for: "CBMZ"  Screenings:  AUDIT    Flowsheet Row Office Visit from 06/10/2022 in [facility]  Alcohol Use Disorder Identification Test Final Score (AUDIT) 5      GAD-7    Flowsheet Row Office Visit from 12/15/2022 in [facility] Office Visit from 10/15/2022 in [facility] Counselor from  08/09/2022 in [facility] Office Visit from 06/10/2022 in [facility] Office Visit from 11/11/2021 in [facility]  Total GAD-7 Score 0 0 13 0 0      PHQ2-9    Flowsheet Row Office Visit from 12/15/2022 in [facility] Office Visit from 10/15/2022 in [facility] Counselor from 08/09/2022 in [facility] Office Visit from 06/10/2022 in [facility] Office Visit from 11/11/2021 in [facility]  PHQ-2 Total Score 0 0 5 0 0  PHQ-9 Total Score 0 -- 15 0 0      Flowsheet Row Video Visit from 11/05/2021 in [facility] from 09/29/2021 in [place] [facility] Video Visit from 06/12/2021 in [facility]  C-SSRS RISK CATEGORY No Risk No Risk Low Risk       Collaboration of Care: Collaboration of Care: Medication Management AEB ongoing medication management, Psychiatrist AEB established with this provider, and Referral or follow-up with counselor/therapist AEB established with individual therapist  Patient/Guardian was advised Release of Information must be obtained prior to any record release in order to collaborate their care with an outside provider. Patient/Guardian was advised if they have not already done so to contact the registration department to sign all necessary forms in order for [place] to release information regarding their care.  Consent: Patient/Guardian gives verbal consent for treatment and assignment of benefits for services provided during this visit. Patient/Guardian expressed understanding and agreed to proceed.   Televisit via video: I connected with patient on 12/22/22 at  9:00 AM EDT by a video enabled telemedicine application and verified that I am  speaking with the correct person using two identifiers.  Location: Patient: home address in [place] Provider: remote office in [place]   I discussed the limitations of evaluation and management by telemedicine and the availability of in person appointments. The patient expressed understanding and agreed to proceed.  I discussed the assessment and treatment plan with the patient. The patient was provided an opportunity to ask questions and all were answered. The patient agreed with the plan and demonstrated an understanding of the instructions.   The patient was advised to call back or seek an in-person evaluation if the symptoms worsen or if the condition fails to improve as anticipated.  I provided 25 minutes dedicated to the care of this patient via video on the date of this encounter to include chart review, face-to-face time with the patient, medication management/counseling.  [place] A Roberth Berling 12/22/2022, 9:21 AM

## 2022-12-21 ENCOUNTER — Ambulatory Visit (INDEPENDENT_AMBULATORY_CARE_PROVIDER_SITE_OTHER): Payer: No Payment, Other | Admitting: Mental Health

## 2022-12-21 ENCOUNTER — Other Ambulatory Visit: Payer: Self-pay

## 2022-12-21 DIAGNOSIS — F319 Bipolar disorder, unspecified: Secondary | ICD-10-CM

## 2022-12-21 DIAGNOSIS — F411 Generalized anxiety disorder: Secondary | ICD-10-CM

## 2022-12-21 DIAGNOSIS — F603 Borderline personality disorder: Secondary | ICD-10-CM

## 2022-12-21 NOTE — Progress Notes (Signed)
THERAPIST PROGRESS NOTE Virtual Visit via Video Note  I connected with Valerie Reilly on 12/21/22 at  9:00 AM EDT by a video enabled telemedicine application and verified that I am speaking with the correct person using two identifiers.  Location: Patient: home address on file Provider: office   I discussed the limitations of evaluation and management by telemedicine and the availability of in person appointments. The patient expressed understanding and agreed to proceed.  I discussed the assessment and treatment plan with the patient. The patient was provided an opportunity to ask questions and all were answered. The patient agreed with the plan and demonstrated an understanding of the instructions.   The patient was advised to call back or seek an in-person evaluation if the symptoms worsen or if the condition fails to improve as anticipated.  I provided 55 minutes of non-face-to-face time during this encounter.   Dorris Singh, [facility]   Session Time: 9:03am (55 minutes)  Participation Level: Active  Behavioral Response: DisheveledAlertIrritable  Type of Therapy: Individual Therapy  Treatment Goals addressed:  Active     BH CCP BIPOLAR DISORDER-MANIA/HYPOMANIA     LTG: Valerie Reilly will stabilize mood and increase goal-directed behavior as measured by self report (Progressing)     Start:  09/08/22    Expected End:  03/10/23         STG: "Deal better." Valerie Reilly will increase management of moods AEB development of x 3 effective emotional regulation and distress tolerance skills with ability to effectively communicate thoughts per self report within the next 90 days.  (Progressing)     Start:  09/08/22    Expected End:  03/10/23       Goal Note     12/21/22: Valerie Reilly is making appropriate progress with goals. Identifies emotional regulation skill of working to self-soothe with deep breathing. Shares has reduced amount of lashing out however continues to over talk  others at times and working to increase Manufacturing systems engineer. Identifies coping skills of music, smoking a cigarette, deep breathing and challenging thoughts. Progressing.          Work with Valerie Reilly to track symptoms, triggers, and/or skill use through a mood chart, diary card, or journal     Start:  09/08/22         Encourage Valerie Reilly to keep a log of medication side effects, questions regarding medication, and symptoms      Start:  09/08/22         Valerie Reilly to take psychotropic medication as prescribed     Start:  09/08/22         Educate Valerie Reilly on bipolar disorder diagnostic criteria     Start:  09/08/22         Provide Valerie Reilly with educational materials on bipolar disorder and symptoms     Start:  09/08/22         Mileigh will identify  self-destructive behaviors (such as promiscuity, substance abuse, and aggression) they engage in during manic episodes     Start:  09/08/22         Takya will develop a list of  consequences of engaging in self-destructive behavior     Start:  09/08/22           ProgressTowards Goals: Progressing  Interventions: CBT and Supportive  Summary: Valerie Reilly is a 47 y.o. female who presents with dx of bipolar disorder, borderline personality disorder and generalized anxiety. Presents to session alert and oriented; mood and affect  adequate. Speech clear and coherent at normal rate and tone. Shares for moods to be stable and shares concern for feelings of anxiety and overthinking in regards to current relationship. Denies concerns for depression or mood swings. Shares to be medication compliant. Shares thoughts on relationship and difficulty trusting. Shares increase in communication and working to regulate emotions and managing stress. Shares coping skills of working away, breathing, listening to music and challenging thoughts with looking for evidence of her thoughts. Shares concerning presenting to beach with boyfriend and  shares to have enjoyed self and would like to increase ability to take trips. Shares stressors related to work and using skills in times of feeling frustrated with work. 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 Reviews treatment plan. Agrees to continue to work to regulate emotions and increase communication skills and allowing boyfriend to speak. " I don't know when to shut up." Denies safety concerns.   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 Completed check in and assessed for current level of functioning, sxs management and stressors. Provided safe space for Celita to share thoughts and feelings in regard to reported stressors. Provided supportive feedback and supported in working to increase communication skills and distress tolerance skills. Reviewed working to walk away and engage in STOP skill and being thoughtful with her words. Assessed for concerns for moods and medication management. Reviewed treatment plan. Provided follow up.   Plan: Return again in  x 2 weeks.  Diagnosis: Bipolar I disorder (HCC)  Borderline personality disorder (HCC)  GAD (generalized anxiety disorder)  Collaboration of Care: Other None  Patient/Guardian was advised Release of Information must be obtained prior to any record release in order to collaborate their care with an outside provider. Patient/Guardian was advised if they have not already done so to contact the registration department to sign all necessary forms in order for [place] to release information regarding their care.   Consent: Patient/Guardian gives verbal consent for treatment and assignment of benefits for services provided during this visit. Patient/Guardian expressed understanding and agreed to proceed.   Stephan Minister [place], [facility] 12/21/2022

## 2022-12-22 ENCOUNTER — Telehealth (INDEPENDENT_AMBULATORY_CARE_PROVIDER_SITE_OTHER): Payer: No Payment, Other | Admitting: Psychiatry

## 2022-12-22 ENCOUNTER — Other Ambulatory Visit: Payer: Self-pay

## 2022-12-22 ENCOUNTER — Other Ambulatory Visit: Payer: Self-pay | Admitting: Nurse Practitioner

## 2022-12-22 ENCOUNTER — Encounter (HOSPITAL_COMMUNITY): Payer: Self-pay | Admitting: Psychiatry

## 2022-12-22 ENCOUNTER — Telehealth: Payer: Self-pay | Admitting: Nurse Practitioner

## 2022-12-22 DIAGNOSIS — F319 Bipolar disorder, unspecified: Secondary | ICD-10-CM

## 2022-12-22 DIAGNOSIS — F603 Borderline personality disorder: Secondary | ICD-10-CM | POA: Diagnosis not present

## 2022-12-22 DIAGNOSIS — F99 Mental disorder, not otherwise specified: Secondary | ICD-10-CM

## 2022-12-22 DIAGNOSIS — F5105 Insomnia due to other mental disorder: Secondary | ICD-10-CM | POA: Diagnosis not present

## 2022-12-22 DIAGNOSIS — F411 Generalized anxiety disorder: Secondary | ICD-10-CM

## 2022-12-22 MED ORDER — TRAZODONE HCL 150 MG PO TABS
375.0000 mg | ORAL_TABLET | Freq: Every day | ORAL | 1 refills | Status: DC
  Filled 2022-12-22: qty 225, 90d supply, fill #0
  Filled 2023-02-14: qty 75, 30d supply, fill #0

## 2022-12-22 MED ORDER — BUSPIRONE HCL 30 MG PO TABS
30.0000 mg | ORAL_TABLET | Freq: Two times a day (BID) | ORAL | 1 refills | Status: DC
  Filled 2022-12-22 – 2023-02-14 (×2): qty 180, 90d supply, fill #0

## 2022-12-22 MED ORDER — METRONIDAZOLE 500 MG PO TABS
500.0000 mg | ORAL_TABLET | Freq: Two times a day (BID) | ORAL | 0 refills | Status: AC
Start: 2022-12-22 — End: 2022-12-30
  Filled 2022-12-22: qty 14, 7d supply, fill #0

## 2022-12-22 MED ORDER — LITHIUM CARBONATE 300 MG PO CAPS
600.0000 mg | ORAL_CAPSULE | Freq: Two times a day (BID) | ORAL | 1 refills | Status: DC
  Filled 2022-12-22: qty 360, 90d supply, fill #0
  Filled 2023-02-14: qty 120, 30d supply, fill #0

## 2022-12-22 NOTE — Telephone Encounter (Signed)
Call placed to patient unable to reach message left on VM.    Call was to advise that was Metronidazole sent to [facility]

## 2022-12-22 NOTE — Telephone Encounter (Signed)
Patient called stated she was positive for a bacteria infection and a pill for the yeast infection she may get from taking an antibiotic. Patient stated there has not been any medication called in for her symptoms. Please f/u with patient

## 2022-12-22 NOTE — Patient Instructions (Signed)
Thank you for attending your appointment today.  -- We did not make any medication changes today. Please continue medications as prescribed.  Please do not make any changes to medications without first discussing with your provider. If you are experiencing a psychiatric emergency, please call 911 or present to your nearest emergency department. Additional crisis, medication management, and therapy resources are included below.  [facility]  [address], [place], [place] [address] [phone] WALK-IN URGENT CARE 24/7 FOR ANYONE [address], [place], [place]  [phone] Fax: [phone] [email or web address] *Interpreters available *Accepts all insurance and uninsured for Urgent Care needs *Accepts Medicaid and uninsured for outpatient treatment (below)      ONLY FOR [place] Residents  Below:    Outpatient New Patient Assessment/Therapy Walk-ins:        Monday -Thursday 8am until slots are full.        Every Friday 1pm-4pm  (first come, first served)                   New Patient Psychiatry/Medication Management        Monday-Friday 8am-11am (first come, first served)               For all walk-ins we ask that you arrive by 7:15am, because patients will be seen in the order of arrival.   

## 2022-12-22 NOTE — Telephone Encounter (Signed)
Metronidazole sent

## 2022-12-23 ENCOUNTER — Other Ambulatory Visit: Payer: Self-pay

## 2022-12-24 ENCOUNTER — Other Ambulatory Visit: Payer: Self-pay

## 2022-12-24 MED ORDER — FLUCONAZOLE 150 MG PO TABS
150.0000 mg | ORAL_TABLET | Freq: Once | ORAL | 0 refills | Status: DC
  Filled 2022-12-24: qty 1, 1d supply, fill #0

## 2022-12-27 ENCOUNTER — Other Ambulatory Visit: Payer: Self-pay

## 2022-12-29 ENCOUNTER — Other Ambulatory Visit: Payer: Self-pay

## 2022-12-30 ENCOUNTER — Other Ambulatory Visit: Payer: Self-pay

## 2022-12-31 ENCOUNTER — Other Ambulatory Visit: Payer: Self-pay

## 2022-12-31 LAB — FECAL OCCULT BLOOD, IMMUNOCHEMICAL: Fecal Occult Bld: NEGATIVE

## 2023-01-04 ENCOUNTER — Ambulatory Visit (INDEPENDENT_AMBULATORY_CARE_PROVIDER_SITE_OTHER): Payer: No Payment, Other | Admitting: Mental Health

## 2023-01-04 DIAGNOSIS — F603 Borderline personality disorder: Secondary | ICD-10-CM

## 2023-01-04 DIAGNOSIS — F319 Bipolar disorder, unspecified: Secondary | ICD-10-CM | POA: Diagnosis not present

## 2023-01-04 DIAGNOSIS — F411 Generalized anxiety disorder: Secondary | ICD-10-CM

## 2023-01-04 NOTE — Progress Notes (Signed)
THERAPIST PROGRESS NOTE Virtual Visit via Video Note  I connected with Valerie Reilly on 01/04/23 at  9:00 AM EDT by a video enabled telemedicine application and verified that I am speaking with the correct person using two identifiers.  Location: Patient: parking lot at work Provider: office    I discussed the limitations of evaluation and management by telemedicine and the availability of in person appointments. The patient expressed understanding and agreed to proceed.  I discussed the assessment and treatment plan with the patient. The patient was provided an opportunity to ask questions and all were answered. The patient agreed with the plan and demonstrated an understanding of the instructions.   The patient was advised to call back or seek an in-person evaluation if the symptoms worsen or if the condition fails to improve as anticipated.  I provided 48 minutes of non-face-to-face time during this encounter.   Valerie Reilly, [facility]   Session Time: 9:02am  Participation Level: Active  Behavioral Response: CasualAlertEuthymic  Type of Therapy: Individual Therapy  Treatment Goals addressed: STG: "Deal better." Valerie Reilly will increase management of moods AEB development of x 3 effective emotional regulation and distress tolerance skills with ability to effectively communicate thoughts per self report within the next 90 days     ProgressTowards Goals: Progressing  Interventions: CBT and Supportive  Summary: Valerie Reilly is a 47 y.o. female who presents with dx of bipolar disorder, borderline personality disorder and generalized anxiety. Presents to session alert and oriented; mood and affect adequate. Speech clear and coherent at normal rate and tone. Shares for moods to be stable and denies concerns for depressive or elevated moods. Notes for there to be a decrease in stressors with daughter's moods to have calmed and working to build back up trust with boyfriend  with increased communication. Shares workign to regulate emotions and learning to calm and self-soothe and allowing others to speak in times of discord. Shares for over thinking behaviors to have also calmed down. Shares to feel as if she is dealing better and managing triggers. Shares thoughts on relationship and desire to marry current partner and shares desire for grand-children. Shares to be doing well and preparing for peak season at work; hopeful of obtaining a new position in the future. Denies SI/HI. Ongoing progress with goals. Sxs stable.   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 Completed check in and assessed for current level of functioning, sxs management and stressors. Provided safe space for Valerie Reilly to share thoughts and feelings in regard to increase in ability to manage stressors. Explored factors supporting in increase in communication with partner and reduction of over thinking behaviors. Explored presence of triggers and working to manage triggers and not allowing others to trigger her. Assessed for mood stability and use of coping skills. Reviewed session and provided follow up.   Plan: Return again in  x 3 weeks.  Diagnosis: Bipolar I disorder (HCC)  GAD (generalized anxiety disorder)  Borderline personality disorder (HCC)  Collaboration of Care: Other None  Patient/Guardian was advised Release of Information must be obtained prior to any record release in order to collaborate their care with an outside provider. Patient/Guardian was advised if they have not already done so to contact the registration department to sign all necessary forms in order for [place] to release information regarding their care.   Consent: Patient/Guardian gives verbal consent for treatment and assignment of benefits for services provided during this visit. Patient/Guardian  expressed understanding and agreed to proceed.   Stephan Minister [place], [facility] 01/04/2023

## 2023-01-05 ENCOUNTER — Other Ambulatory Visit: Payer: Self-pay

## 2023-01-07 ENCOUNTER — Other Ambulatory Visit: Payer: Self-pay | Admitting: Nurse Practitioner

## 2023-01-07 ENCOUNTER — Other Ambulatory Visit: Payer: Self-pay

## 2023-01-07 ENCOUNTER — Other Ambulatory Visit (HOSPITAL_COMMUNITY): Payer: Self-pay

## 2023-01-07 DIAGNOSIS — I10 Essential (primary) hypertension: Secondary | ICD-10-CM

## 2023-01-07 MED ORDER — BENAZEPRIL HCL 20 MG PO TABS
20.0000 mg | ORAL_TABLET | Freq: Every day | ORAL | 1 refills | Status: DC
  Filled 2023-01-07: qty 90, 90d supply, fill #0
  Filled 2023-04-11: qty 90, 90d supply, fill #1

## 2023-01-07 NOTE — Telephone Encounter (Signed)
Requested Prescriptions  Pending Prescriptions Disp Refills   benazepril (LOTENSIN) 20 MG tablet 90 tablet 1    Sig: Take 1 tablet (20 mg total) by mouth daily.     Cardiovascular:  ACE Inhibitors Failed - 01/07/2023  9:32 AM      Failed - Last BP in normal range    BP Readings from Last 1 Encounters:  12/15/22 (!) 143/85         Passed - Cr in normal range and within 180 days    Creatinine  Date Value Ref Range Status  02/02/2021 0.73 0.44 - 1.00 mg/dL Final   Creatinine, Ser  Date Value Ref Range Status  12/15/2022 0.81 0.57 - 1.00 mg/dL Final         Passed - K in normal range and within 180 days    Potassium  Date Value Ref Range Status  12/15/2022 4.6 3.5 - 5.2 mmol/L Final         Passed - Patient is not pregnant      Passed - Valid encounter within last 6 months    Recent Outpatient Visits           3 weeks ago Recurrent vaginitis   [place] [facility] [place], [place] W, NP   2 months ago Lumbar back pain with radiculopathy affecting right lower extremity   [place] [facility] Storm Frisk, MD   7 months ago Primary hypertension   [place] [place], Shea Stakes, NP   1 year ago Acute non-recurrent maxillary sinusitis   [place] [facility] [place], Odette Horns, MD   1 year ago Type 2 diabetes mellitus without complication, with long-term current use of insulin [facility])   [place] [facility] [place], Shea Stakes, NP       Future Appointments             In 5 months Claiborne Rigg, NP [facility]

## 2023-01-11 ENCOUNTER — Other Ambulatory Visit: Payer: Self-pay

## 2023-01-11 MED ORDER — LORAZEPAM 2 MG PO TABS
ORAL_TABLET | ORAL | 0 refills | Status: DC
  Filled 2023-01-11: qty 2, 1d supply, fill #0

## 2023-01-12 ENCOUNTER — Other Ambulatory Visit: Payer: Self-pay

## 2023-01-13 ENCOUNTER — Other Ambulatory Visit: Payer: Self-pay

## 2023-01-13 MED ORDER — CHLORHEXIDINE GLUCONATE 0.12 % MT SOLN
15.0000 mL | Freq: Two times a day (BID) | OROMUCOSAL | 0 refills | Status: DC
  Filled 2023-01-13: qty 473, 16d supply, fill #0

## 2023-01-13 MED ORDER — IBUPROFEN 600 MG PO TABS
600.0000 mg | ORAL_TABLET | Freq: Four times a day (QID) | ORAL | 0 refills | Status: DC | PRN
  Filled 2023-01-13: qty 20, 5d supply, fill #0

## 2023-01-13 MED ORDER — AMOXICILLIN 875 MG PO TABS
875.0000 mg | ORAL_TABLET | Freq: Two times a day (BID) | ORAL | 0 refills | Status: DC
  Filled 2023-01-13: qty 10, 5d supply, fill #0

## 2023-01-13 MED ORDER — HYDROCODONE-ACETAMINOPHEN 7.5-325 MG PO TABS
1.0000 | ORAL_TABLET | Freq: Four times a day (QID) | ORAL | 0 refills | Status: DC | PRN
Start: 2023-01-13 — End: 2023-06-15
  Filled 2023-01-13: qty 8, 2d supply, fill #0

## 2023-01-18 ENCOUNTER — Ambulatory Visit (HOSPITAL_COMMUNITY): Payer: No Payment, Other | Admitting: Mental Health

## 2023-01-18 DIAGNOSIS — F603 Borderline personality disorder: Secondary | ICD-10-CM | POA: Diagnosis not present

## 2023-01-18 DIAGNOSIS — F319 Bipolar disorder, unspecified: Secondary | ICD-10-CM

## 2023-01-18 DIAGNOSIS — F411 Generalized anxiety disorder: Secondary | ICD-10-CM | POA: Diagnosis not present

## 2023-01-18 NOTE — Progress Notes (Unsigned)
THERAPIST PROGRESS NOTE Virtual Visit via Video Note  I connected with Valerie Reilly on 01/18/23 at  9:00 AM EST by a video enabled telemedicine application and verified that I am speaking with the correct person using two identifiers.  Location: Patient: home address Provider: office   I discussed the limitations of evaluation and management by telemedicine and the availability of in person appointments. The patient expressed understanding and agreed to proceed.  I discussed the assessment and treatment plan with the patient. The patient was provided an opportunity to ask questions and all were answered. The patient agreed with the plan and demonstrated an understanding of the instructions.   The patient was advised to call back or seek an in-person evaluation if the symptoms worsen or if the condition fails to improve as anticipated.  I provided 51 minutes of non-face-to-face time during this encounter.   Dorris Singh, [facility]   Session Time: 9:05 am (51 minutes)  Participation Level: Active  Behavioral Response: CasualAlertNeutral/Stable  Type of Therapy: Individual Therapy  Treatment Goals addressed:  STG: "Deal better." Tokiko will increase management of moods AEB development of x 3 effective emotional regulation and distress tolerance skills with ability to effectively communicate thoughts per self report within the next 90 days   ProgressTowards Goals: Progressing  Interventions: CBT and Supportive  Summary: Valerie Reilly is a 47 y.o. female who presents with dx of bipolar disorder, borderline personality disorder and generalized anxiety. Presents to session alert and oriented; mood and affect adequate. Speech clear and coherent at normal rate and tone. Shares for moods to be stable and denies concerns for depressive or elevated moods. Shares anxiety to be stable as well. Shares some stress recently with daughter having to be admitted to the hospital with  blood clots but shares for her to be out and doing well currently. Shares thought on desire to secure other employment and desire for benefits and increased pay. Shares to have received dentures and had her teeth pulled in which she feels this will serve as great self-esteem booster and looking forward to healing and ability to wear teeth. Shares for communication to be going well in relationship and plans to marry her boyfriend. Shares has been able to effectively communicate her concerns and shares for things to be going well. Ongoing progress with goals. Denies safety concerns.   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 Completed check in and assessed for current level of functioning, sxs management and stressors. Provided safe space for Matti to share thoughts and feelings in regard to increase in ability to manage stressors. Explored ability to communicate concerns effectively and ability to manage feelings of distress in calm manner. Supprted in processing recent stressors and ability to work through. Explored factors that are supporting in mental health stability. Reviewed session and provided follow up. Encouraged attendance to resources to support in resume building.   Plan: Return again in  x 3 weeks.  Diagnosis: Bipolar I disorder (HCC)  Borderline personality disorder (HCC)  GAD (generalized anxiety disorder)  Collaboration of Care: Other None  Patient/Guardian was advised Release of Information must be obtained prior to any record release in order to collaborate their care with an outside provider. Patient/Guardian was advised if they have not already done so to contact the registration department to sign all necessary forms in order for [place] to release information regarding their care.   Consent: Patient/Guardian gives verbal consent for treatment and assignment  of benefits for services provided during this  visit. Patient/Guardian expressed understanding and agreed to proceed.   Stephan Minister [place], [facility] 01/18/2023

## 2023-01-24 ENCOUNTER — Other Ambulatory Visit: Payer: Self-pay

## 2023-01-24 ENCOUNTER — Other Ambulatory Visit: Payer: Self-pay | Admitting: Nurse Practitioner

## 2023-01-24 DIAGNOSIS — E039 Hypothyroidism, unspecified: Secondary | ICD-10-CM

## 2023-01-25 ENCOUNTER — Other Ambulatory Visit: Payer: Self-pay

## 2023-01-25 MED ORDER — LEVOTHYROXINE SODIUM 50 MCG PO TABS
50.0000 ug | ORAL_TABLET | Freq: Every day | ORAL | 1 refills | Status: DC
  Filled 2023-01-25: qty 90, 90d supply, fill #0
  Filled 2023-04-25: qty 90, 90d supply, fill #1

## 2023-01-25 NOTE — Telephone Encounter (Signed)
Requested Prescriptions  Pending Prescriptions Disp Refills   levothyroxine (SYNTHROID) 50 MCG tablet 90 tablet 1    Sig: Take 1 tablet (50 mcg total) by mouth daily before breakfast.     Endocrinology:  Hypothyroid Agents Passed - 01/24/2023  9:53 AM      Passed - TSH in normal range and within 360 days    TSH  Date Value Ref Range Status  12/15/2022 1.200 0.450 - 4.500 uIU/mL Final         Passed - Valid encounter within last 12 months    Recent Outpatient Visits           1 month ago Recurrent vaginitis   [place] [facility] [place] [place] W, NP   3 months ago Lumbar back pain with radiculopathy affecting right lower extremity   [place] [facility] [place] Storm Frisk, MD   7 months ago Primary hypertension   [place] [facility] [place]. [facility] Claiborne Rigg, NP   1 year ago Acute non-recurrent maxillary sinusitis   [place] [facility] [place]. [facility] Hoy Register, MD   1 year ago Type 2 diabetes mellitus without complication, with long-term current use of insulin (HCC)   [place] [facility] [place]. [facility] Claiborne Rigg, NP       Future Appointments             In 4 months Claiborne Rigg, NP [facility] [place] [facility]g[place]. [facility]

## 2023-01-27 ENCOUNTER — Other Ambulatory Visit: Payer: Self-pay

## 2023-02-09 ENCOUNTER — Other Ambulatory Visit: Payer: Self-pay

## 2023-02-14 ENCOUNTER — Other Ambulatory Visit: Payer: Self-pay

## 2023-02-16 ENCOUNTER — Ambulatory Visit (HOSPITAL_COMMUNITY): Payer: No Payment, Other | Admitting: Mental Health

## 2023-02-16 DIAGNOSIS — F411 Generalized anxiety disorder: Secondary | ICD-10-CM

## 2023-02-16 DIAGNOSIS — F603 Borderline personality disorder: Secondary | ICD-10-CM

## 2023-02-16 DIAGNOSIS — F319 Bipolar disorder, unspecified: Secondary | ICD-10-CM | POA: Diagnosis not present

## 2023-02-16 NOTE — Progress Notes (Signed)
   THERAPIST PROGRESS NOTE Virtual Visit via Video Note  I connected with Valerie Reilly on 02/16/23 at  9:00 AM EST by a video enabled telemedicine application and verified that I am speaking with the correct person using two identifiers.  Location: Patient: home address on file Provider: office   I discussed the limitations of evaluation and management by telemedicine and the availability of in person appointments. The patient expressed understanding and agreed to proceed.  I discussed the assessment and treatment plan with the patient. The patient was provided an opportunity to ask questions and all were answered. The patient agreed with the plan and demonstrated an understanding of the instructions.   The patient was advised to call back or seek an in-person evaluation if the symptoms worsen or if the condition fails to improve as anticipated.  I provided 50 minutes of non-face-to-face time during this encounter.   Dorris Singh, [facility]   Session Time: 9:03 am   Participation Level: Active  Behavioral Response: CasualAlertStable  Type of Therapy: Individual Therapy  Treatment Goals addressed: STG: "Deal better." Poetry will increase management of moods AEB development of x 3 effective emotional regulation and distress tolerance skills with ability to effectively communicate thoughts per self report within the next 90 days   ProgressTowards Goals: Progressing  Interventions: Supportive  Summary: Valerie Reilly is a 47 y.o. female who presents with dx of bipolar disorder, borderline personality disorder and generalized anxiety. Presents to session alert and oriented; mood and affect adequate. Speech clear and coherent at normal rate and tone. Shares for moods to be stable and to be doing well. Shares thoughts on employment and desire to gain additional income with current position vs. Exploring new positions. Explores with therapist options and pros and cons of  seeking new employment. Shares needs/wants from employers and plans for her future. Shares for relationship to be going well and for them to be communicating well. Shares management of emotions and distress tolerance and denies irritable/agitated outburst. Shares concerns for daughter but reports ability to set boundaries and focus on self. Continues to be medication complaint. Sxs stable; progress with goals.   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 Completed check in and assessed for current level of functioning, sxs management and stressors. Provided safe space for Richie to share thoughts and feelings in regard to increase in ability to manage stressors. Explored ability to communicate as needed with [place] supports. Supported in processing thoughts related to work and explored benefits and drawbacks of exploring and staring new employment vs. Benefits and drawbacks of remaining at current employment. Reviewed skills for regulation of emotions and moods as well as managing distress. No safety concerns.   Plan: Return again in x 4 weeks.  Diagnosis: Bipolar I disorder (HCC)  Borderline personality disorder (HCC)  GAD (generalized anxiety disorder)  Collaboration of Care: Other None  Patient/Guardian was advised Release of Information must be obtained prior to any record release in order to collaborate their care with an outside provider. Patient/Guardian was advised if they have not already done so to contact the registration department to sign all necessary forms in order for [place] to release information regarding their care.   Consent: Patient/Guardian gives verbal consent for treatment and assignment of benefits for services provided during this visit. Patient/Guardian expressed understanding and agreed to proceed.   Stephan Minister [place], [facility] 02/16/2023

## 2023-02-17 ENCOUNTER — Other Ambulatory Visit: Payer: Self-pay

## 2023-02-18 ENCOUNTER — Other Ambulatory Visit: Payer: Self-pay

## 2023-02-21 ENCOUNTER — Other Ambulatory Visit: Payer: Self-pay | Admitting: Nurse Practitioner

## 2023-02-21 ENCOUNTER — Other Ambulatory Visit: Payer: Self-pay

## 2023-02-21 DIAGNOSIS — E119 Type 2 diabetes mellitus without complications: Secondary | ICD-10-CM

## 2023-02-21 MED ORDER — METFORMIN HCL 1000 MG PO TABS
1000.0000 mg | ORAL_TABLET | Freq: Two times a day (BID) | ORAL | 0 refills | Status: DC
  Filled 2023-02-21: qty 180, 90d supply, fill #0

## 2023-02-24 ENCOUNTER — Other Ambulatory Visit: Payer: Self-pay

## 2023-02-24 ENCOUNTER — Other Ambulatory Visit: Payer: Self-pay | Admitting: Nurse Practitioner

## 2023-02-24 DIAGNOSIS — J3089 Other allergic rhinitis: Secondary | ICD-10-CM

## 2023-02-24 MED ORDER — FLUTICASONE PROPIONATE 50 MCG/ACT NA SUSP
2.0000 | Freq: Every day | NASAL | 2 refills | Status: DC
  Filled 2023-02-24 (×2): qty 16, 30d supply, fill #0
  Filled 2023-04-11 (×2): qty 16, 30d supply, fill #1
  Filled 2023-06-13 (×2): qty 16, 30d supply, fill #2

## 2023-02-25 ENCOUNTER — Other Ambulatory Visit: Payer: Self-pay

## 2023-02-28 ENCOUNTER — Other Ambulatory Visit: Payer: Self-pay

## 2023-03-10 ENCOUNTER — Telehealth: Payer: Self-pay | Admitting: Family Medicine

## 2023-03-10 ENCOUNTER — Other Ambulatory Visit: Payer: Self-pay

## 2023-03-10 DIAGNOSIS — E7800 Pure hypercholesterolemia, unspecified: Secondary | ICD-10-CM

## 2023-03-10 DIAGNOSIS — E119 Type 2 diabetes mellitus without complications: Secondary | ICD-10-CM

## 2023-03-10 MED ORDER — GLIPIZIDE ER 10 MG PO TB24
10.0000 mg | ORAL_TABLET | Freq: Every day | ORAL | 0 refills | Status: DC
  Filled 2023-03-10: qty 90, 90d supply, fill #0

## 2023-03-10 MED ORDER — SIMVASTATIN 40 MG PO TABS
40.0000 mg | ORAL_TABLET | Freq: Every day | ORAL | 0 refills | Status: DC
  Filled 2023-03-10: qty 90, 90d supply, fill #0

## 2023-03-11 ENCOUNTER — Other Ambulatory Visit: Payer: Self-pay

## 2023-03-14 ENCOUNTER — Encounter (HOSPITAL_COMMUNITY): Payer: Self-pay

## 2023-03-14 ENCOUNTER — Ambulatory Visit (HOSPITAL_COMMUNITY): Payer: No Payment, Other | Admitting: Mental Health

## 2023-03-14 DIAGNOSIS — F319 Bipolar disorder, unspecified: Secondary | ICD-10-CM | POA: Diagnosis not present

## 2023-03-14 DIAGNOSIS — F603 Borderline personality disorder: Secondary | ICD-10-CM

## 2023-03-14 NOTE — Progress Notes (Signed)
 THERAPIST PROGRESS NOTE Virtual Visit via Video Note  I connected with Valerie Reilly on 03/14/23 at  9:00 AM EST by a video enabled telemedicine application and verified that I am speaking with the correct person using two identifiers.  Location: Patient: home address on file Provider: home office   I discussed the limitations of evaluation and management by telemedicine and the availability of in person appointments. The patient expressed understanding and agreed to proceed.  I discussed the assessment and treatment plan with the patient. The patient was provided an opportunity to ask questions and all were answered. The patient agreed with the plan and demonstrated an understanding of the instructions.   The patient was advised to call back or seek an in-person evaluation if the symptoms worsen or if the condition fails to improve as anticipated.  I provided 50 minutes of non-face-to-face time during this encounter.   Ty Bernice Sav[place], [facility]   Session Time: 9:02 am ( 50 minutes)  Participation Level: Active  Behavioral Response: CasualAlertEuthymic  Type of Therapy: Individual Therapy  Treatment Goals addressed: STG: Deal better. Razia will increase management of stress AEB ability to process events in balanced way with engagement in at least x 1 coping skills for stress as needed with the next 90 days.    ProgressTowards Goals: Progressing  Interventions: Supportive  Summary: Valerie Reilly is a 48 y.o. female who presents with dx of bipolar disorder, borderline personality disorder and generalized anxiety. Presents to session alert and oriented; mood and affect adequate. Speech clear and coherent at normal rate and tone. Shares for moods to have been stable and denies elevated or depressive moods. Notes to have been laid off from her position at work a few days prior to Christmas but shares to be coping well. Notes to have applied for unemployment and has put  in several applications for new employment. Shares feelings of frustration with daughter and her lack of attendance to finances and not keeping up with responsibilities. Notes additional stressor of partner to hold lying behaviors and unsure if this is a behavior she can continue to deal with. Explores thoughts of trust with therapist and lack of ability to change others. Agrees to updated goals. Ongoing progress with goals. Denies safety concerns.     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 Completed check in and assessed for current level of functioning, sxs management and stressors. Provided safe space for Valerie Reilly to share thoughts and feelings in regard to current stressors. Active empathic listening; providing support and encouragement. Explored and process inability to change others and ability to only control self and reactions to others and what she does and does not tolerate and accept. Discussed levels of boundaries with daughter and ability to explore what behavior she is willing to accept. Reviewed treatment plan and session.   Plan: Return again in  x 2 weeks.  Diagnosis: Bipolar I disorder (HCC)  Borderline personality disorder (HCC)  Collaboration of Care: Other None  Patient/Guardian was advised Release of Information must be obtained prior to any record release in order to collaborate their care with an outside provider. Patient/Guardian was advised if they have not already done so to contact the registration department to sign all necessary forms in order for us  to release information regarding their care.   Consent: Patient/Guardian gives verbal consent for treatment and assignment of benefits for services provided during this visit. Patient/Guardian expressed understanding and agreed to proceed.  Ty Asal [place], [facility] 03/14/2023

## 2023-03-16 ENCOUNTER — Other Ambulatory Visit: Payer: Self-pay

## 2023-03-17 ENCOUNTER — Other Ambulatory Visit: Payer: Self-pay

## 2023-03-23 NOTE — Progress Notes (Signed)
BH MD Outpatient Progress Note  03/25/2023 9:26 AM Valerie Reilly  MRN:  [id number]  Assessment:  Valerie Reilly presents for follow-up evaluation. Today, 03/25/23, patient reports continued psychiatric stability although experiences episodes of irritability triggered by interpersonal interactions with daughter; these are short lived and do not appear to be leading to significant distress/dysfunction at this time. She reports episodes of brain fogging during the day and wonders if this may be side effect to gabapentin; discussed cognitive dulling could be 2/2 gabapentin, high-dose trazodone, or even feature of anxiety. Encouraged patient to pay attention to triggers and patterns to these episodes however in the meantime will trial discontinuation of daytime gabapentin dose. Patient would likely benefit from taper of high-dose trazodone in the future to minimize daytime sedation.  RTC in 2.5 months by video.  Identifying Information: Valerie Reilly is a 48 y.o. female with a history of bipolar 1 disorder, borderline personality disorder, GAD, T2DM, hypothyroidism on Synthroid, HTN, and HLD who is an established patient with [facility] participating in follow-up via video conferencing.   Plan:  # Historical diagnosis of bipolar 1 disorder  GAD # Borderline personality disorder Past medication trials: unknown Status of problem: stable Interventions: -- Continue lithium 600 mg BID (patient prefers 300 mg capsules as currently rx) -- Continue buspirone 30 mg 2 times daily -- Continue gabapentin 100 mg BID  -- Instructed patient she may trial decrease to 100 mg nightly given concern for brain fogging during the day -- Continue individual psychotherapy with [facility]  # Sleep regulation Past medication trials: unknown Status of problem: stable Interventions: -- Continue trazodone 375 mg at bedtime   -- If brain fogging persists, will trial decrease in  medication given concern for sedation persisting into daytime  # High risk medication use Interventions: -- Lithium:  -- Kidney function within normal limits 06/10/2022  -- TSH within normal limits 06/10/2022  -- Lithium level 0.5 06/10/2022  Patient was given contact information for behavioral health clinic and was instructed to call 911 for emergencies.   Subjective:  Chief Complaint:  Chief Complaint  Patient presents with   Medication Management    Interval History:  Patient reports she is doing "alright." Was laid off from her job; recently started getting unemployment which will help while she looks for other jobs. 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 Describes mood as "okay" although can feel irritated by relations with daughter. Otherwise, denies feeling persistently down, irritable, or elevated. Denies SI/HI.  Reports she hasn't had menses in a year; has begun having hot flashes. Will discuss at PCP appt in April. Continues to sleep overall well with use of trazodone and getting 8 hours nightly.  Reports episodes of brain fogging in which she loses track of what she was doing or thinking; wonders if this may be related to medications in particular gabapentin. Amenable to discontinuation of daytime dose; discussed that decrease in trazodone can be pursued in future visit if this effect persists despite change in gabapentin.  Visit Diagnosis:    ICD-10-CM   1. Bipolar I disorder (HCC)  F31.9 lithium carbonate 300 MG capsule    traZODone (DESYREL) 150 MG tablet    2. GAD (generalized anxiety disorder)  F41.1 busPIRone (BUSPAR) 30 MG tablet    gabapentin (NEURONTIN) 100 MG capsule    3. Insomnia due to other mental disorder  F51.05 traZODone (DESYREL) 150 MG tablet   F99     4. Borderline personality  disorder (HCC)  F60.3       Past Psychiatric History:  Diagnoses: Historical diagnosis of bipolar 1 disorder, borderline personality disorder, cannabis use in  remission Medication trials: Has been on current medication regimen since at least 2016 with noted benefit Hospitalizations: multiple - last hospitalization at [facility] in 2014 for episode of mania Suicide attempt: yes - many years ago; hx of 2-3 previous attempts  Substance use:   -- Etoh: denies  -- Denies use of cannabis or other illicit drugs  -- Tobacco: 2 ppd; precontemplative regarding cessation  Past Medical History:  Past Medical History:  Diagnosis Date   Allergy    Anxiety    Asthma    Bipolar disorder (HCC)    Diabetes mellitus    Diabetes mellitus, type II (HCC)    Gallstone    GERD (gastroesophageal reflux disease)    History of borderline personality disorder    Hypercholesteremia    Hypertension    Obesity    Psoriasis    Psoriasis    Seasonal allergies    Thyroid disease     Past Surgical History:  Procedure Laterality Date   CHOLECYSTECTOMY N/A 10/02/2021   Procedure: LAPAROSCOPIC CHOLECYSTECTOMY;  Surgeon: Lewie Chamber, DO;  Location: [facility];  Service: General;  Laterality: N/A;   TONSILLECTOMY     TUBAL LIGATION      Family Psychiatric History:  Maternal uncle: depression, alcohol use Mother: personality disorder  Family History:  Family History  Problem Relation Age of Onset   Personality disorder Mother    Diabetes Mother    Diabetes Father    Alcohol abuse Maternal Uncle    Depression Maternal Uncle    Diabetes Maternal Grandfather    Breast cancer Neg Hx    Stomach cancer Neg Hx    Colon cancer Neg Hx    Esophageal cancer Neg Hx    Colon polyps Neg Hx    Crohn's disease Neg Hx    Ulcerative colitis Neg Hx    Rectal cancer Neg Hx     Social History:  Social History   Socioeconomic History   Marital status: Divorced    Spouse name: Not on file   Number of children: 1   Years of education: Not on file   Highest education level: Associate degree: occupational, Scientist, product/process development, or vocational program  Occupational History    Occupation: delivery newspaper  Tobacco Use   Smoking status: Every Day    Current packs/day: 2.00    Average packs/day: 2.0 packs/day for 30.0 years (60.0 ttl pk-yrs)    Types: Cigarettes    Passive exposure: Current   Smokeless tobacco: Never  Vaping Use   Vaping status: Never Used  Substance and Sexual Activity   Alcohol use: Yes    Comment: occasional- a few times a year   Drug use: No    Types: Marijuana    Comment: last time was 3 yrs ago   Sexual activity: Yes    Birth control/protection: Surgical    Comment: Tubal ligation  Other Topics Concern   Not on file  Social History Narrative   Not on file   Social Drivers of Health   Financial Resource Strain: Low Risk  (12/15/2022)   Overall Financial Resource Strain (CARDIA)    Difficulty of Paying Living Expenses: Not hard at all  Food Insecurity: No Food Insecurity (12/15/2022)   Hunger Vital Sign    Worried About Running Out of Food in the Last Year: Never true  Ran Out of Food in the Last Year: Never true  Transportation Needs: No Transportation Needs (12/15/2022)   PRAPARE - Administrator, Civil Service (Medical): No    Lack of Transportation (Non-Medical): No  Physical Activity: Inactive (12/15/2022)   Exercise Vital Sign    Days of Exercise per Week: 0 days    Minutes of Exercise per Session: 0 min  Stress: No Stress Concern Present (12/15/2022)   [employer] of Occupational Health - Occupational Stress Questionnaire    Feeling of Stress : Not at all  Social Connections: Socially Isolated (12/15/2022)   Social Connection and Isolation Panel [NHANES]    Frequency of Communication with Friends and Family: More than three times a week    Frequency of Social Gatherings with Friends and Family: Once a week    Attends Religious Services: Never    Database administrator or Organizations: No    Attends Engineer, structural: Never    Marital Status: Divorced    Allergies: No Known  Allergies  Current Medications: Current Outpatient Medications  Medication Sig Dispense Refill   albuterol (VENTOLIN HFA) 108 (90 Base) MCG/ACT inhaler Inhale 2 puffs into the lungs every 6 (six) hours as needed for wheezing or shortness of breath. 6.7 g 2   amoxicillin (AMOXIL) 875 MG tablet Take 1 tablet (875 mg total) by mouth every 12 (twelve) hours. 10 tablet 0   benazepril (LOTENSIN) 20 MG tablet Take 1 tablet (20 mg total) by mouth daily. 90 tablet 1   busPIRone (BUSPAR) 30 MG tablet Take 1 tablet (30 mg total) by mouth 2 (two) times daily. 180 tablet 1   chlorhexidine (PERIDEX) 0.12 % solution RINSE MOUTH WITH (1 CAPFUL) FOR 30 SECONDS AM AND PM AFTER TOOTHBRUSHING. EXPECTORATE AFTER RINSING, DO NOT SWALLOW 473 mL 0   cholecalciferol (VITAMIN D) 1000 UNITS tablet Take 1,000 Units by mouth daily.     famotidine (PEPCID) 20 MG tablet Take 1 tablet (20 mg total) by mouth daily. 90 tablet 1   fexofenadine (ALLEGRA) 180 MG tablet Take 1 tablet (180 mg total) by mouth daily. 90 tablet 1   fluticasone (FLONASE) 50 MCG/ACT nasal spray Place 2 sprays into both nostrils daily. 16 g 2   gabapentin (NEURONTIN) 100 MG capsule Take 1 capsule (100 mg total) by mouth 2 (two) times daily. 180 capsule 0   glipiZIDE (GLUCOTROL XL) 10 MG 24 hr tablet Take 1 tablet (10 mg total) by mouth daily with breakfast. 90 tablet 0   guselkumab (TREMFYA) 100 MG/ML prefilled syringe Inject 1 syringe (100mg /39ml) into the skin every 8 weeks 1 mL 11   HYDROcodone-acetaminophen (NORCO) 7.5-325 MG tablet TAKE 1 TABLET EVERY 6 HOURS AS NEEDED. 8 tablet 0   hydroxypropyl methylcellulose / hypromellose (ISOPTO TEARS / GONIOVISC) 2.5 % ophthalmic solution Place 1 drop into both eyes in the morning and at bedtime.     levothyroxine (SYNTHROID) 50 MCG tablet Take 1 tablet (50 mcg total) by mouth daily before breakfast. 90 tablet 1   lidocaine (LIDODERM) 5 % Place 1 patch onto the skin daily. Remove & Discard patch within 12  hours daily, to lower back 30 patch 0   lithium carbonate 300 MG capsule Take 2 capsules (600 mg total) by mouth in the morning and at bedtime. 360 capsule 1   LORazepam (ATIVAN) 2 MG tablet PLEASE ARRIVE IN THE OFFICE 30 MINUTES PRIOR TO APPOINTMENT AND BRING THIS MEDICATION WITH YOU. DO NOT TAKE UNTIL  YOU HAVE BEEN CHECKED IN. 2 tablet 0   metFORMIN (GLUCOPHAGE) 1000 MG tablet Take 1 tablet (1,000 mg total) by mouth 2 (two) times daily with a meal. 180 tablet 0   methocarbamol (ROBAXIN) 500 MG tablet Take 1 tablet (500 mg total) by mouth every 6 (six) hours as needed for muscle spasms. 60 tablet 1   Multiple Vitamin (MULTIVITAMIN) capsule Take 1 capsule by mouth daily.     nicotine (NICODERM CQ - DOSED IN MG/24 HOURS) 21 mg/24hr patch Place 1 patch (21 mg total) onto the skin daily. 42 patch 0   Probiotic Product (PROBIOTIC BLEND PO) Take 1 capsule by mouth daily.     simvastatin (ZOCOR) 40 MG tablet Take 1 tablet (40 mg total) by mouth daily. 90 tablet 0   traZODone (DESYREL) 150 MG tablet Take 2.5 tablets (375 mg total) by mouth at bedtime. 225 tablet 1   No current facility-administered medications for this visit.    ROS: See above  Objective:  Psychiatric Specialty Exam: There were no vitals taken for this visit.There is no height or weight on file to calculate BMI.  General Appearance: Casual and Fairly Groomed  Eye Contact:  Good  Speech:  Clear and Coherent and Normal Rate  Volume:  Normal  Mood:   "pretty good"  Affect:   Euthymic; calm  Thought Content:  Denies AVH; IOR; paranoia    Suicidal Thoughts:  No  Homicidal Thoughts:  No  Thought Process:  Goal Directed and Linear  Orientation:  Full (Time, Place, and Person)    Memory:   Grossly oriented  Judgment:  Good  Insight:  Good  Concentration:  Concentration: Good  Recall:  NA  Fund of Knowledge: Good  Language: Good  Psychomotor Activity:  Normal  Akathisia:  NA  AIMS (if indicated): not done  Assets:   Communication Skills Desire for Improvement Housing Intimacy Leisure Time Social Support Vocational/Educational  ADL's:  Intact  Cognition: WNL  Sleep:  Good   PE: General: sits comfortably in view of camera; no acute distress  Pulm: no increased work of breathing on room air  MSK: all extremity movements appear intact  Neuro: no focal neurological deficits observed  Gait & Station: unable to assess by video    Metabolic Disorder Labs: Lab Results  Component Value Date   HGBA1C 5.9 (H) 12/15/2022   No results found for: "PROLACTIN" Lab Results  Component Value Date   CHOL 135 12/15/2022   TRIG 109 12/15/2022   HDL 41 12/15/2022   CHOLHDL 3.3 12/15/2022   LDLCALC 74 12/15/2022   LDLCALC 54 06/10/2022   Lab Results  Component Value Date   TSH 1.200 12/15/2022   TSH 1.690 06/10/2022    Therapeutic Level Labs: Lab Results  Component Value Date   LITHIUM 0.5 06/10/2022   LITHIUM 0.8 10/13/2020   No results found for: "VALPROATE" No results found for: "CBMZ"  Screenings:  AUDIT    Flowsheet Row Office Visit from 06/10/2022 in [facility] Dept Of [place]. [facility]  Alcohol Use Disorder Identification Test Final Score (AUDIT) 5      GAD-7    Flowsheet Row Office Visit from 12/15/2022 in [facility] [place] - A Dept Of [place]. [facility] Office Visit from 10/15/2022 in [facility] - A Dept Of [place]. [facility] Counselor from 08/09/2022 in [facility] Office Visit from 06/10/2022  in [facility] [place] - A Dept Of [place]. [facility] Office Visit from 11/11/2021 in [facility] [place] - A Dept Of [place] Bridegroom. [place]  Total GAD-7 Score 0 0 13 0 0      PHQ2-9    Flowsheet Row Office Visit from 12/15/2022 in [facility] [place] - A Dept Of [place]. [facility] Office Visit from  10/15/2022 in [facility] - A Dept Of [place]. [facility] Counselor from 08/09/2022 in [facility] Office Visit from 06/10/2022 in [facility] [place] - A Dept Of . [facility] Office Visit from 11/11/2021 in [facility] [place] - A Dept Of [place] Bridegroom. [facility]  PHQ-2 Total Score 0 0 5 0 0  PHQ-9 Total Score 0 -- 15 0 0      Flowsheet Row Video Visit from 11/05/2021 in [facility] 60 from 09/29/2021 in [facility] Video Visit from 06/12/2021 in [facility]  C-SSRS RISK CATEGORY No Risk No Risk Low Risk       Collaboration of Care: Collaboration of Care: Medication Management AEB ongoing medication management, Psychiatrist AEB established with this provider, and Referral or follow-up with counselor/therapist AEB established with individual therapist  Patient/Guardian was advised Release of Information must be obtained prior to any record release in order to collaborate their care with an outside provider. Patient/Guardian was advised if they have not already done so to contact the registration department to sign all necessary forms in order for [place] to release information regarding their care.   Consent: Patient/Guardian gives verbal consent for treatment and assignment of benefits for services provided during this visit. Patient/Guardian expressed understanding and agreed to proceed.   Televisit via video: I connected with patient on 03/25/23 at  9:00 AM EST by a video enabled telemedicine application and verified that I am speaking with the correct person using two identifiers.  Location: Patient: home address in [place] Provider: remote office in [place]   I discussed the limitations of evaluation and management by telemedicine and the availability of in person appointments. The patient expressed  understanding and agreed to proceed.  I discussed the assessment and treatment plan with the patient. The patient was provided an opportunity to ask questions and all were answered. The patient agreed with the plan and demonstrated an understanding of the instructions.   The patient was advised to call back or seek an in-person evaluation if the symptoms worsen or if the condition fails to improve as anticipated.  I provided 30 minutes dedicated to the care of this patient via video on the date of this encounter to include chart review, face-to-face time with the patient, medication management/counseling, brief supportive psychotherapy.  Avelina Mcclurkin A Rashawna Scoles 03/25/2023, 9:26 AM

## 2023-03-25 ENCOUNTER — Telehealth (INDEPENDENT_AMBULATORY_CARE_PROVIDER_SITE_OTHER): Payer: No Payment, Other | Admitting: Psychiatry

## 2023-03-25 ENCOUNTER — Encounter (HOSPITAL_COMMUNITY): Payer: Self-pay | Admitting: Psychiatry

## 2023-03-25 ENCOUNTER — Other Ambulatory Visit: Payer: Self-pay

## 2023-03-25 DIAGNOSIS — F319 Bipolar disorder, unspecified: Secondary | ICD-10-CM | POA: Diagnosis not present

## 2023-03-25 DIAGNOSIS — F5105 Insomnia due to other mental disorder: Secondary | ICD-10-CM

## 2023-03-25 DIAGNOSIS — F411 Generalized anxiety disorder: Secondary | ICD-10-CM | POA: Diagnosis not present

## 2023-03-25 DIAGNOSIS — F603 Borderline personality disorder: Secondary | ICD-10-CM

## 2023-03-25 DIAGNOSIS — F99 Mental disorder, not otherwise specified: Secondary | ICD-10-CM

## 2023-03-25 MED ORDER — LITHIUM CARBONATE 300 MG PO CAPS
600.0000 mg | ORAL_CAPSULE | Freq: Two times a day (BID) | ORAL | 1 refills | Status: DC
  Filled 2023-03-25 – 2023-04-12 (×2): qty 120, 30d supply, fill #0

## 2023-03-25 MED ORDER — BUSPIRONE HCL 30 MG PO TABS
30.0000 mg | ORAL_TABLET | Freq: Two times a day (BID) | ORAL | 1 refills | Status: DC
  Filled 2023-03-25: qty 180, 90d supply, fill #0

## 2023-03-25 MED ORDER — GABAPENTIN 100 MG PO CAPS
100.0000 mg | ORAL_CAPSULE | Freq: Two times a day (BID) | ORAL | 0 refills | Status: DC
  Filled 2023-03-25: qty 180, 90d supply, fill #0

## 2023-03-25 MED ORDER — TRAZODONE HCL 150 MG PO TABS
375.0000 mg | ORAL_TABLET | Freq: Every day | ORAL | 1 refills | Status: DC
  Filled 2023-03-25: qty 75, 30d supply, fill #0

## 2023-03-25 NOTE — Patient Instructions (Signed)
Thank you for attending your appointment today.  -- Try decreasing gabapentin to 100 mg nightly (stop the morning dose) to see if this helps with your mental clouding.  -- Continue other medications as prescribed.  Please do not make any changes to medications without first discussing with your provider. If you are experiencing a psychiatric emergency, please call 911 or present to your nearest emergency department. Additional crisis, medication management, and therapy resources are included below.  [facility]  [address], [place], [place] [address] [phone] WALK-IN URGENT CARE 24/7 FOR ANYONE [address], [place], [place]  [phone] Fax: [phone] [email or web address] *Interpreters available *Accepts all insurance and uninsured for Urgent Care needs *Accepts Medicaid and uninsured for outpatient treatment (below)      ONLY FOR [facility]  Below:    Outpatient New Patient Assessment/Therapy Walk-ins:        Monday, Wednesday, and Thursday 8am until slots are full (first come, first served)                   New Patient Psychiatry/Medication Management        Monday-Friday 8am-11am (first come, first served)               For all walk-ins we ask that you arrive by 7:15am, because patients will be seen in the order of arrival.

## 2023-03-28 ENCOUNTER — Ambulatory Visit (INDEPENDENT_AMBULATORY_CARE_PROVIDER_SITE_OTHER): Payer: No Payment, Other | Admitting: Mental Health

## 2023-03-28 DIAGNOSIS — F319 Bipolar disorder, unspecified: Secondary | ICD-10-CM

## 2023-03-28 DIAGNOSIS — F603 Borderline personality disorder: Secondary | ICD-10-CM

## 2023-03-28 DIAGNOSIS — F411 Generalized anxiety disorder: Secondary | ICD-10-CM

## 2023-03-28 NOTE — Progress Notes (Unsigned)
THERAPIST PROGRESS NOTE Virtual Visit via Video Note  I connected with Valerie Reilly on 03/28/23 at  9:00 AM EST by a video enabled telemedicine application and verified that I am speaking with the correct person using two identifiers.  Location: Patient: home address on file Provider: home office   I discussed the limitations of evaluation and management by telemedicine and the availability of in person appointments. The patient expressed understanding and agreed to proceed.  I discussed the assessment and treatment plan with the patient. The patient was provided an opportunity to ask questions and all were answered. The patient agreed with the plan and demonstrated an understanding of the instructions.   The patient was advised to call back or seek an in-person evaluation if the symptoms worsen or if the condition fails to improve as anticipated.  I provided 50 minutes of non-face-to-face time during this encounter.   Dorris Singh, [facility]   Session Time: 9:06 am  ( 50 minutes)  Participation Level: Active  Behavioral Response: CasualAlertDysphoric  Type of Therapy: Individual Therapy  Treatment Goals addressed:  STG: "Deal better." Lenee will increase management of stress AEB ability to process events in balanced way with engagement in at least x 1 coping skills for stress as needed with the next 90 days.   ProgressTowards Goals: Progressing  Interventions: Supportive  Summary: Valerie Reilly is a 48 y.o. female who presents with dx of bipolar disorder, borderline personality disorder and generalized anxiety. Presents to session alert and oriented; mood and affect adequate. Reports increase in feelings of stress.  Speech clear and coherent at normal rate and tone. Shares for moods to have been stable and denies elevated or depressive moods. Notes to have received her unemployment and looking for employment. Shares main stressor related to daughter. Notes for  daughter to have a cannabis problem and spends hgih degree of money on use in which she is not paying her bills. Shares for her tags to have been taken away as a result of not having car insurance. Shares feelings of disappointment in daughter; sharing for daughter to be disrespectful. Reports for her and daughter's name to be on mortgage and concerned if she leaves her credit will be messed up and unable to have daughter removed with her being on mortgage; despite her not keeping up with bills. Shares stress secondary to boyfriend and concerned for his lying behaviors. Shares plans to wait until July and re-evaluate relationship with boyfriend. Shares will explore options and work to focus on what she can control as means of coping. Shares to spend time watching T.V for relaxation. Denies safety concerns. Ongoing progress with goals.   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 Completed check in and assessed for current level of functioning, sxs management and stressors. Provided safe space for [place] to share thoughts and feelings in regard to current stressors. Active empathic listening; provided support and encouragement. Supported in processing feelings of stress and worry in regards to daugher and hx of relationship with daughter. Encouraged to focus on what she can control and setting boundaries with daughter to support in stress management. Reviewed session and provided follow up.   Plan: Return again in  x 4 weeks.  Diagnosis: Bipolar I disorder (HCC)  GAD (generalized anxiety disorder)  Borderline personality disorder (HCC)  Collaboration of Care: Other None  Patient/Guardian was advised Release of Information must be obtained prior to any record release in order to collaborate  their care with an outside provider. Patient/Guardian was advised if they have not already done so to contact the registration department to  sign all necessary forms in order for [place] to release information regarding their care.   Consent: Patient/Guardian gives verbal consent for treatment and assignment of benefits for services provided during this visit. Patient/Guardian expressed understanding and agreed to proceed.   Stephan Minister [place], [facility] 03/28/2023

## 2023-04-04 ENCOUNTER — Other Ambulatory Visit: Payer: Self-pay

## 2023-04-04 ENCOUNTER — Ambulatory Visit: Payer: Self-pay | Admitting: Nurse Practitioner

## 2023-04-04 NOTE — Telephone Encounter (Unsigned)
Copied from CRM [id number]. Topic: Clinical - Red Word Triage >> Apr 04, 2023  2:26 PM Valerie Reilly wrote: Red Word that prompted transfer to Nurse Triage: feels like she is "losing her mind".  No period for a month.  Feeling real going "crazy"

## 2023-04-04 NOTE — Telephone Encounter (Signed)
  Chief Complaint: worsening anxiety/depression, concerned for menopause Symptoms: vaginal dryness, hot flashes, feeling shaky, difficulty concentrating, amenorrhea (LMP 04/2022),depression, anxiety Frequency: x couple of weeks, worsened x past week Pertinent Negatives: Patient denies suicidal ideations or plans Disposition: [] ED /[] Urgent Care (no appt availability in office) / [x] Appointment(In office/virtual)/ []  [facility]/ [] Home Care/ [] Refused Recommended Disposition /[] [facility]/ []  Follow-up with PCP Additional Notes: Patient states she has no thought or plans to harm herself but states last week her depression was worsening and she had suicidal thoughts. Patient states her daughter is at home with her now and her boyfriend lives with her. She states she will call her psychiatrist or their hotline as well. Patient states she is concerned her anxiety and depression have been worsening due to her starting menopause. She states she has been compliant with her mental health medications and recently saw her psychiatrist. Patient agreeable to office visit at available location tomorrow. No appointments available at her PCP today or tomorrow. Reason for Disposition  Sometimes has thoughts of suicide  Answer Assessment - Initial Assessment Questions 1. MAIN SYMPTOM: "What is your main symptom?" "How bad is it?"  (e.g., difficulty concentrating, hot flashes, fatigue, irregular periods, vaginal dryness)     Patient is concerned she has not had a period in almost a year. She states last week she was having suicidal ideations. Hot flashes, feeling shaky, vaginal dryness, difficulty concentrating.  2. ONSET: "When did the symptoms begin?" (e.g., hours, days or weeks ago)     Last couple of weeks. Worse the past week.  3. MENOPAUSE: "When was your last menstrual period?"     February 2024.  4. MEDICINES: "Are you taking any hormone prescription or over-the-counter medicines?"  (e.g., estrogen; estrogen/progesterone, herbal supplements, medicines for mood, new medicines, Niacin)     Patient states she has been taking her Lithium and anxiety medications.  5. OTHER SYMPTOMS: "Do you have any other symptoms?" (e.g., insomnia, mood swings)     Patient states as long as she has her sleeping pills she doesn't have insomnia; and mood swings.  6. CALLER'S COPING: "How are you doing?" (e.g., anxiety, depression, emotional state with menopause symptoms)     Patient states she had an appointment recently with her psychiatrist and has continued taking her medications. Patient states she feels like sometimes she is good about doing activities and sometimes she just wants to watch TV and lay in bed.  7. TREATMENTS AND RESPONSE: "What have you done so far to try to make this better?"     Patient states she doesn't feel like her anxiety medicine has been helping. Patient states she has been on the same mental health medication for years.  Protocols used: Menopause Symptoms and Questions-A-AH, Depression-A-AH

## 2023-04-05 ENCOUNTER — Encounter: Payer: Self-pay | Admitting: Family Medicine

## 2023-04-05 ENCOUNTER — Ambulatory Visit (INDEPENDENT_AMBULATORY_CARE_PROVIDER_SITE_OTHER): Payer: Self-pay | Admitting: Family Medicine

## 2023-04-05 ENCOUNTER — Other Ambulatory Visit: Payer: Self-pay

## 2023-04-05 VITALS — BP 138/70 | HR 69 | Temp 97.9°F | Ht 67.0 in | Wt 223.0 lb

## 2023-04-05 DIAGNOSIS — F411 Generalized anxiety disorder: Secondary | ICD-10-CM

## 2023-04-05 MED ORDER — HYDROXYZINE PAMOATE 25 MG PO CAPS
25.0000 mg | ORAL_CAPSULE | Freq: Three times a day (TID) | ORAL | 0 refills | Status: DC | PRN
  Filled 2023-04-05: qty 15, 5d supply, fill #0

## 2023-04-05 NOTE — Assessment & Plan Note (Signed)
Chronic uncontrolled. She has been on Lithium, Buspar, and Trazodone for many years and could likely benefit from medication adjustments. I encouraged her to follow up with PCP or Psychiatry as I feel this would be more appropriate. I have provided her with Hydroxyzine PRN for breakthrough anxiety until she can see them. Valerie Reilly is not currently endorsing SI/HI and we discussed a plan of action should these emerge.

## 2023-04-05 NOTE — Progress Notes (Signed)
Subjective:  HPI: Valerie Reilly is a 48 y.o. female presenting on 04/05/2023 for Acute Visit (As requested by NP Dimas Aguas, I left message on patient's vmail to advise her she may want to follow up with her pcp based on the reason for her visit. - JBG\\\worsening depression and anxiety, concerned she is going into menopause (vaginal dryness, hot flashes, feeling shaky) LMP was Feb 2024.)   HPI Patient is in today for worsening anxiety and depression over last few weeks. She feels "agitated and shaky". She did see her Psychiatrist on 1/17 and did not report these symptoms to her however they discussed some brain fog and her Gabapentin was reduced from 100mg  BID to daily. No other medication changes. She had a lithium level drawn by PCP in April 2024 and this was normal. Valerie Reilly denies current SI/HI but does admit to suicidal thoughts last week. She is aware of the 24h emergency line with psychiatry and I also provided resources including 9[facility], [facility], and the emergency room. She denies hallucinations, delusions, insomnia. No chest pain, SOB, lightheadedness, dizziness or tremor.   Review of Systems  All other systems reviewed and are negative.   Relevant past medical history reviewed and updated as indicated.   Past Medical History:  Diagnosis Date   Allergy    Anxiety    Asthma    Bipolar disorder (HCC)    Diabetes mellitus    Diabetes mellitus, type II (HCC)    Gallstone    GERD (gastroesophageal reflux disease)    History of borderline personality disorder    Hypercholesteremia    Hypertension    Obesity    Psoriasis    Psoriasis    Seasonal allergies    Thyroid disease      Past Surgical History:  Procedure Laterality Date   CHOLECYSTECTOMY N/A 10/02/2021   Procedure: LAPAROSCOPIC CHOLECYSTECTOMY;  Surgeon: Lewie Chamber, DO;  Location: [facility];  Service: General;  Laterality: N/A;   TONSILLECTOMY     TUBAL LIGATION      Allergies and medications  reviewed and updated.   Current Outpatient Medications:    albuterol (VENTOLIN HFA) 108 (90 Base) MCG/ACT inhaler, Inhale 2 puffs into the lungs every 6 (six) hours as needed for wheezing or shortness of breath., Disp: 6.7 g, Rfl: 2   benazepril (LOTENSIN) 20 MG tablet, Take 1 tablet (20 mg total) by mouth daily., Disp: 90 tablet, Rfl: 1   busPIRone (BUSPAR) 30 MG tablet, Take 1 tablet (30 mg total) by mouth 2 (two) times daily., Disp: 180 tablet, Rfl: 1   cholecalciferol (VITAMIN D) 1000 UNITS tablet, Take 1,000 Units by mouth daily., Disp: , Rfl:    famotidine (PEPCID) 20 MG tablet, Take 1 tablet (20 mg total) by mouth daily., Disp: 90 tablet, Rfl: 1   fexofenadine (ALLEGRA) 180 MG tablet, Take 1 tablet (180 mg total) by mouth daily., Disp: 90 tablet, Rfl: 1   fluticasone (FLONASE) 50 MCG/ACT nasal spray, Place 2 sprays into both nostrils daily., Disp: 16 g, Rfl: 2   gabapentin (NEURONTIN) 100 MG capsule, Take 1 capsule (100 mg total) by mouth 2 (two) times daily. (Patient taking differently: Take 100 mg by mouth once.), Disp: 180 capsule, Rfl: 0   glipiZIDE (GLUCOTROL XL) 10 MG 24 hr tablet, Take 1 tablet (10 mg total) by mouth daily with breakfast., Disp: 90 tablet, Rfl: 0   guselkumab (TREMFYA) 100 MG/ML prefilled syringe, Inject 1 syringe (100mg /65ml) into the skin every 8  weeks, Disp: 1 mL, Rfl: 11   hydroxypropyl methylcellulose / hypromellose (ISOPTO TEARS / GONIOVISC) 2.5 % ophthalmic solution, Place 1 drop into both eyes in the morning and at bedtime., Disp: , Rfl:    hydrOXYzine (VISTARIL) 25 MG capsule, Take 1 capsule (25 mg total) by mouth every 8 (eight) hours as needed., Disp: 15 capsule, Rfl: 0   levothyroxine (SYNTHROID) 50 MCG tablet, Take 1 tablet (50 mcg total) by mouth daily before breakfast., Disp: 90 tablet, Rfl: 1   lithium carbonate 300 MG capsule, Take 2 capsules (600 mg total) by mouth in the morning and at bedtime., Disp: 360 capsule, Rfl: 1   metFORMIN (GLUCOPHAGE) 1000  MG tablet, Take 1 tablet (1,000 mg total) by mouth 2 (two) times daily with a meal., Disp: 180 tablet, Rfl: 0   methocarbamol (ROBAXIN) 500 MG tablet, Take 1 tablet (500 mg total) by mouth every 6 (six) hours as needed for muscle spasms., Disp: 60 tablet, Rfl: 1   Multiple Vitamin (MULTIVITAMIN) capsule, Take 1 capsule by mouth daily., Disp: , Rfl:    nicotine (NICODERM CQ - DOSED IN MG/24 HOURS) 21 mg/24hr patch, Place 1 patch (21 mg total) onto the skin daily., Disp: 42 patch, Rfl: 0   simvastatin (ZOCOR) 40 MG tablet, Take 1 tablet (40 mg total) by mouth daily., Disp: 90 tablet, Rfl: 0   traZODone (DESYREL) 150 MG tablet, Take 2.5 tablets (375 mg total) by mouth at bedtime., Disp: 225 tablet, Rfl: 1   amoxicillin (AMOXIL) 875 MG tablet, Take 1 tablet (875 mg total) by mouth every 12 (twelve) hours. (Patient not taking: Reported on 04/05/2023), Disp: 10 tablet, Rfl: 0   chlorhexidine (PERIDEX) 0.12 % solution, RINSE MOUTH WITH (1 CAPFUL) FOR 30 SECONDS AM AND PM AFTER TOOTHBRUSHING. EXPECTORATE AFTER RINSING, DO NOT SWALLOW (Patient not taking: Reported on 04/05/2023), Disp: 473 mL, Rfl: 0   HYDROcodone-acetaminophen (NORCO) 7.5-325 MG tablet, TAKE 1 TABLET EVERY 6 HOURS AS NEEDED. (Patient not taking: Reported on 04/05/2023), Disp: 8 tablet, Rfl: 0   lidocaine (LIDODERM) 5 %, Place 1 patch onto the skin daily. Remove & Discard patch within 12 hours daily, to lower back (Patient not taking: Reported on 04/05/2023), Disp: 30 patch, Rfl: 0   LORazepam (ATIVAN) 2 MG tablet, PLEASE ARRIVE IN THE OFFICE 30 MINUTES PRIOR TO APPOINTMENT AND BRING THIS MEDICATION WITH YOU. DO NOT TAKE UNTIL YOU HAVE BEEN CHECKED IN. (Patient not taking: Reported on 04/05/2023), Disp: 2 tablet, Rfl: 0   Probiotic Product (PROBIOTIC BLEND PO), Take 1 capsule by mouth daily. (Patient not taking: Reported on 04/05/2023), Disp: , Rfl:   No Known Allergies  Objective:   BP 138/70   Pulse 69   Temp 97.9 F (36.6 C) (Oral)    Ht 5\' 7"  (1.702 m)   Wt 223 lb (101.2 kg)   LMP 04/08/2022 (Approximate)   SpO2 96%   BMI 34.93 kg/m      04/05/2023    9:00 AM 12/15/2022   10:06 AM 12/15/2022    8:42 AM  Vitals with BMI  Height 5\' 7"   5\' 7"   Weight 223 lbs  224 lbs 10 oz  BMI 34.92  35.17  Systolic 138 143 161  Diastolic 70 85 91  Pulse 69  98     Physical Exam Vitals and nursing note reviewed.  Constitutional:      Appearance: Normal appearance. She is normal weight.  HENT:     Head: Normocephalic and atraumatic.  Skin:  General: Skin is warm and dry.  Neurological:     General: No focal deficit present.     Mental Status: She is alert and oriented to person, place, and time. Mental status is at baseline.  Psychiatric:        Mood and Affect: Mood normal.        Behavior: Behavior normal.        Thought Content: Thought content normal.        Judgment: Judgment normal.     Assessment & Plan:  GAD (generalized anxiety disorder) Assessment & Plan: Chronic uncontrolled. She has been on Lithium, Buspar, and Trazodone for many years and could likely benefit from medication adjustments. I encouraged her to follow up with PCP or Psychiatry as I feel this would be more appropriate. I have provided her with Hydroxyzine PRN for breakthrough anxiety until she can see them. Valerie Reilly is not currently endorsing SI/HI and we discussed a plan of action should these emerge.    Other orders -     hydrOXYzine Pamoate; Take 1 capsule (25 mg total) by mouth every 8 (eight) hours as needed.  Dispense: 15 capsule; Refill: 0     Follow up plan: Return if symptoms worsen or fail to improve.  Park Meo, FNP

## 2023-04-05 NOTE — Progress Notes (Unsigned)
BH MD Outpatient Progress Note  04/06/2023 11:33 AM Valerie Reilly  MRN:  [id number]  Assessment:  Valerie Reilly presents for follow-up evaluation. Today, 04/06/23, patient is seen today for earlier visit after she disclosed to primary care yesterday recent thoughts of SI. Upon further exploration, patient shares that she has been experiencing worsening irritability related to increased interpersonal conflict with daughter as well as perimenopausal symptoms. She reflects that since she lost her job 1 month ago, there have been increased interactions with daughter lending more opportunities for conflict. Brief psychotherapeutic support provided and explored ways in which working was previously beneficial to her sense of productivity and engagement. She is in the process of looking for jobs. She reports fleeting active SI with thoughts of overdose last week that have now resolved; she adamantly denies planning/intent to act on these thoughts at the time and presents as future oriented. While she has history of suicide attempts in the past (>10 years ago), she identifies feeling proud of herself that she has not acted on these thoughts for quite some time and presents as motivated to engage in therapy to continue working on distress tolerance. It is not felt that hospitalization is warranted at this time. She denies signs/symptoms concerning for mania outside of irritability that is improving. Will not make any medication changes today and obtain labs to ensure stable lithium level while focusing on therapeutic support.   RTC in 5 weeks by video. Encouraged patient to call clinic if symptoms worsen and emergency resources for urgent symptoms were reviewed.  Identifying Information: Valerie Reilly is a 48 y.o. female with a history of bipolar 1 disorder, borderline personality disorder, GAD, T2DM, hypothyroidism on Synthroid, HTN, and HLD who is an established patient with [facility] participating in follow-up via video conferencing.   Plan:  # Historical diagnosis of bipolar 1 disorder  GAD # Borderline personality disorder Past medication trials: unknown Status of problem: recent exacerbation Interventions: -- Continue lithium 600 mg BID (patient prefers 300 mg capsules as currently rx) -- Continue buspirone 30 mg 2 times daily -- Continue gabapentin 100 mg nightly -- Continue Atarax 25 mg TID PRN anxiety -- Continue individual psychotherapy with [facility]  -- Patient was provided with therapist's walk in hours -- Emergency resources reviewed including [facility]  # Sleep regulation Past medication trials: unknown Status of problem: stable Interventions: -- Continue trazodone 375 mg at bedtime   -- If brain fogging persists, will trial decrease in medication given concern for sedation persisting into daytime  # High risk medication use Interventions: -- Lithium:  -- Kidney function within normal limits 06/10/2022  -- TSH within normal limits 06/10/2022  -- Lithium level 0.5 06/10/2022 **Updated lab monitoring ordered today including lithium level, CBC, CMP, TSH  Patient was given contact information for behavioral health clinic and was instructed to call 911 for emergencies.   Subjective:  Chief Complaint:  Chief Complaint  Patient presents with   Medication Management    Interval History:   Valerie Reilly reports she wanted to be seen for earlier appointment today due to worsening depression and irritability last week. She wonders if she may be going through menopause (almost been a year since last cycle) as she has been experiencing hot flashes and more irritability. Last week, experienced fleeting active SI in setting of interpersonal stress with daughter and boyfriend. Was feeling stuck and overwhelmed. Had fleeting thoughts of overdosing on pills but adamantly denies desire/intent to act on  this. States she tried to distract herself with watching  TV. Reports it had previously been years since she had thoughts like this. Identifies daughter remains protective factor although difficult when she is experiencing conflict with daughter.   Reports resolution of SI this week and mood has been better. Has found Atarax 25 mg rx by PCP helpful for restlessness and agitation.  Reflects that since being out of work, there has been more time spent in close quarters with daughter and feels this may be contributor to worsening irritability as well.  Agrees that working is likely beneficial for her mental health; has worked on Scientist, clinical (histocompatibility and immunogenetics) resume and has applied to a few jobs.   She denies any missed doses of medications. Sleeping 7-8 hours nightly. Last week had a few days in which she felt more productive (cleaning more) but denies risky or impulsive behaviors, decreased need for sleep, excessively elevated mood, grandiosity.   Visit Diagnosis:    ICD-10-CM   1. Borderline personality disorder (HCC)  F60.3     2. High risk medication use  Z79.899 Lithium level    CBC w/Diff/Platelet    Comprehensive Metabolic Panel (CMET)    Thyroid Panel With TSH    3. Bipolar I disorder (HCC)  F31.9     4. GAD (generalized anxiety disorder)  F41.1      Past Psychiatric History:  Diagnoses: Historical diagnosis of bipolar 1 disorder, borderline personality disorder, cannabis use disorder in remission Medication trials: Has been on current medication regimen since at least 2016 with noted benefit Hospitalizations: multiple - last hospitalization at [facility] in 2014 for episode of mania Suicide attempt: yes - many years ago; hx of 2-3 previous attempts  Substance use:   -- Etoh: denies  -- Denies use of cannabis or other illicit drugs  -- Tobacco: 2 ppd; precontemplative regarding cessation  Past Medical History:  Past Medical History:  Diagnosis Date   Allergy    Anxiety    Asthma    Bipolar disorder (HCC)    Diabetes mellitus    Diabetes mellitus, type  II (HCC)    Gallstone    GERD (gastroesophageal reflux disease)    History of borderline personality disorder    Hypercholesteremia    Hypertension    Obesity    Psoriasis    Psoriasis    Seasonal allergies    Thyroid disease     Past Surgical History:  Procedure Laterality Date   CHOLECYSTECTOMY N/A 10/02/2021   Procedure: LAPAROSCOPIC CHOLECYSTECTOMY;  Surgeon: Lewie Chamber, DO;  Location: [facility];  Service: General;  Laterality: N/A;   TONSILLECTOMY     TUBAL LIGATION      Family Psychiatric History:  Maternal uncle: depression, alcohol use Mother: personality disorder  Family History:  Family History  Problem Relation Age of Onset   Personality disorder Mother    Diabetes Mother    Diabetes Father    Alcohol abuse Maternal Uncle    Depression Maternal Uncle    Diabetes Maternal Grandfather    Breast cancer Neg Hx    Stomach cancer Neg Hx    Colon cancer Neg Hx    Esophageal cancer Neg Hx    Colon polyps Neg Hx    Crohn's disease Neg Hx    Ulcerative colitis Neg Hx    Rectal cancer Neg Hx     Social History:  Social History   Socioeconomic History   Marital status: Divorced    Spouse name: Not on file  Number of children: 1   Years of education: Not on file   Highest education level: Associate degree: occupational, technical, or vocational program  Occupational History   Occupation: delivery newspaper  Tobacco Use   Smoking status: Every Day    Current packs/day: 2.00    Average packs/day: 2.0 packs/day for 30.0 years (60.0 ttl pk-yrs)    Types: Cigarettes    Passive exposure: Current   Smokeless tobacco: Never  Vaping Use   Vaping status: Never Used  Substance and Sexual Activity   Alcohol use: Yes    Comment: occasional- a few times a year   Drug use: No    Types: Marijuana    Comment: last time was 3 yrs ago   Sexual activity: Yes    Birth control/protection: Surgical    Comment: Tubal ligation  Other Topics Concern   Not on  file  Social History Narrative   Not on file   Social Drivers of Health   Financial Resource Strain: Low Risk  (12/15/2022)   Overall Financial Resource Strain (CARDIA)    Difficulty of Paying Living Expenses: Not hard at all  Food Insecurity: No Food Insecurity (12/15/2022)   Hunger Vital Sign    Worried About Running Out of Food in the Last Year: Never true    Ran Out of Food in the Last Year: Never true  Transportation Needs: No Transportation Needs (12/15/2022)   PRAPARE - Administrator, Civil Service (Medical): No    Lack of Transportation (Non-Medical): No  Physical Activity: Inactive (12/15/2022)   Exercise Vital Sign    Days of Exercise per Week: 0 days    Minutes of Exercise per Session: 0 min  Stress: No Stress Concern Present (12/15/2022)   [employer] of Occupational Health - Occupational Stress Questionnaire    Feeling of Stress : Not at all  Social Connections: Socially Isolated (12/15/2022)   Social Connection and Isolation Panel [NHANES]    Frequency of Communication with Friends and Family: More than three times a week    Frequency of Social Gatherings with Friends and Family: Once a week    Attends Religious Services: Never    Database administrator or Organizations: No    Attends Engineer, structural: Never    Marital Status: Divorced    Allergies: No Known Allergies  Current Medications: Current Outpatient Medications  Medication Sig Dispense Refill   busPIRone (BUSPAR) 30 MG tablet Take 1 tablet (30 mg total) by mouth 2 (two) times daily. 180 tablet 1   gabapentin (NEURONTIN) 100 MG capsule Take 1 capsule (100 mg total) by mouth 2 (two) times daily. (Patient taking differently: Take 100 mg by mouth at bedtime.) 180 capsule 0   hydrOXYzine (VISTARIL) 25 MG capsule Take 1 capsule (25 mg total) by mouth every 8 (eight) hours as needed. 15 capsule 0   levothyroxine (SYNTHROID) 50 MCG tablet Take 1 tablet (50 mcg total) by mouth daily  before breakfast. 90 tablet 1   lithium carbonate 300 MG capsule Take 2 capsules (600 mg total) by mouth in the morning and at bedtime. 360 capsule 1   traZODone (DESYREL) 150 MG tablet Take 2.5 tablets (375 mg total) by mouth at bedtime. 225 tablet 1   albuterol (VENTOLIN HFA) 108 (90 Base) MCG/ACT inhaler Inhale 2 puffs into the lungs every 6 (six) hours as needed for wheezing or shortness of breath. 6.7 g 2   amoxicillin (AMOXIL) 875 MG tablet Take 1 tablet (  875 mg total) by mouth every 12 (twelve) hours. (Patient not taking: Reported on 04/05/2023) 10 tablet 0   benazepril (LOTENSIN) 20 MG tablet Take 1 tablet (20 mg total) by mouth daily. 90 tablet 1   chlorhexidine (PERIDEX) 0.12 % solution RINSE MOUTH WITH (1 CAPFUL) FOR 30 SECONDS AM AND PM AFTER TOOTHBRUSHING. EXPECTORATE AFTER RINSING, DO NOT SWALLOW (Patient not taking: Reported on 04/05/2023) 473 mL 0   cholecalciferol (VITAMIN D) 1000 UNITS tablet Take 1,000 Units by mouth daily.     famotidine (PEPCID) 20 MG tablet Take 1 tablet (20 mg total) by mouth daily. 90 tablet 1   fexofenadine (ALLEGRA) 180 MG tablet Take 1 tablet (180 mg total) by mouth daily. 90 tablet 1   fluticasone (FLONASE) 50 MCG/ACT nasal spray Place 2 sprays into both nostrils daily. 16 g 2   glipiZIDE (GLUCOTROL XL) 10 MG 24 hr tablet Take 1 tablet (10 mg total) by mouth daily with breakfast. 90 tablet 0   guselkumab (TREMFYA) 100 MG/ML prefilled syringe Inject 1 syringe (100mg /72ml) into the skin every 8 weeks 1 mL 11   HYDROcodone-acetaminophen (NORCO) 7.5-325 MG tablet TAKE 1 TABLET EVERY 6 HOURS AS NEEDED. (Patient not taking: Reported on 04/05/2023) 8 tablet 0   hydroxypropyl methylcellulose / hypromellose (ISOPTO TEARS / GONIOVISC) 2.5 % ophthalmic solution Place 1 drop into both eyes in the morning and at bedtime.     lidocaine (LIDODERM) 5 % Place 1 patch onto the skin daily. Remove & Discard patch within 12 hours daily, to lower back (Patient not taking:  Reported on 04/05/2023) 30 patch 0   LORazepam (ATIVAN) 2 MG tablet PLEASE ARRIVE IN THE OFFICE 30 MINUTES PRIOR TO APPOINTMENT AND BRING THIS MEDICATION WITH YOU. DO NOT TAKE UNTIL YOU HAVE BEEN CHECKED IN. (Patient not taking: Reported on 04/06/2023) 2 tablet 0   metFORMIN (GLUCOPHAGE) 1000 MG tablet Take 1 tablet (1,000 mg total) by mouth 2 (two) times daily with a meal. 180 tablet 0   methocarbamol (ROBAXIN) 500 MG tablet Take 1 tablet (500 mg total) by mouth every 6 (six) hours as needed for muscle spasms. 60 tablet 1   Multiple Vitamin (MULTIVITAMIN) capsule Take 1 capsule by mouth daily.     nicotine (NICODERM CQ - DOSED IN MG/24 HOURS) 21 mg/24hr patch Place 1 patch (21 mg total) onto the skin daily. 42 patch 0   Probiotic Product (PROBIOTIC BLEND PO) Take 1 capsule by mouth daily. (Patient not taking: Reported on 04/05/2023)     simvastatin (ZOCOR) 40 MG tablet Take 1 tablet (40 mg total) by mouth daily. 90 tablet 0   No current facility-administered medications for this visit.    ROS: See above  Objective:  Psychiatric Specialty Exam: Last menstrual period 04/08/2022.There is no height or weight on file to calculate BMI.  General Appearance: Casual and Fairly Groomed  Eye Contact:  Good  Speech:  Clear and Coherent and Normal Rate  Volume:  Normal  Mood:   "better now - depressed last week"  Affect:   Euthymic; calm  Thought Content:  Denies AVH; IOR; paranoia    Suicidal Thoughts:   Denies today  Homicidal Thoughts:  No  Thought Process:  Goal Directed and Linear  Orientation:  Full (Time, Place, and Person)    Memory:   Grossly oriented  Judgment:  Good  Insight:  Good  Concentration:  Concentration: Good  Recall:  NA  Fund of Knowledge: Good  Language: Good  Psychomotor Activity:  Normal  Akathisia:  NA  AIMS (if indicated): not done  Assets:  Communication Skills Desire for Improvement Housing Intimacy Leisure Time Social Support Vocational/Educational   ADL's:  Intact  Cognition: WNL  Sleep:  Good   PE: General: sits comfortably in view of camera; no acute distress  Pulm: no increased work of breathing on room air  MSK: all extremity movements appear intact  Neuro: no focal neurological deficits observed  Gait & Station: unable to assess by video    Metabolic Disorder Labs: Lab Results  Component Value Date   HGBA1C 5.9 (H) 12/15/2022   No results found for: "PROLACTIN" Lab Results  Component Value Date   CHOL 135 12/15/2022   TRIG 109 12/15/2022   HDL 41 12/15/2022   CHOLHDL 3.3 12/15/2022   LDLCALC 74 12/15/2022   LDLCALC 54 06/10/2022   Lab Results  Component Value Date   TSH 1.200 12/15/2022   TSH 1.690 06/10/2022    Therapeutic Level Labs: Lab Results  Component Value Date   LITHIUM 0.5 06/10/2022   LITHIUM 0.8 10/13/2020   No results found for: "VALPROATE" No results found for: "CBMZ"  Screenings:  AUDIT    Flowsheet Row Office Visit from 06/10/2022 in [facility] Of [place]. [facility]  Alcohol Use Disorder Identification Test Final Score (AUDIT) 5      GAD-7    Flowsheet Row Office Visit from 04/05/2023 in [facility] Office Visit from 12/15/2022 in [facility] [place] [facility] A Dept Of [place]. [facility] Office Visit from 10/15/2022 in [facility] - A Dept Of [place]. [facility] [place] Counselor from 08/09/2022 in [facility] Office Visit from 06/10/2022 in [facility] - A Dept Of [place]. [facility]  Total GAD-7 Score 11 0 0 13 0      PHQ2-9    Flowsheet Row Office Visit from 04/05/2023 in [facility] Office Visit from 12/15/2022 in [facility] [place] - A Dept Of [place]. [facility] Office Visit from 10/15/2022 in [facility] A Dept Of [place]. [facility] Counselor from 08/09/2022 in [facility] [address] [facility] [address] [facility] Office Visit from 06/10/2022 in [facility] [place] - A Dept Of [place]. [facility]  PHQ-2 Total Score 4 0 0 5 0  PHQ-9 Total Score 9 0 -- 15 0      Flowsheet Row Video Visit from 11/05/2021 in [facility] Pre-Admission Testing 60 from 09/29/2021 in [place] Video Visit from 06/12/2021 in [facility]  C-SSRS RISK CATEGORY No Risk No Risk Low Risk       Collaboration of Care: Collaboration of Care: Medication Management AEB ongoing medication management, Psychiatrist AEB established with this provider, and Referral or follow-up with counselor/therapist AEB established with individual therapist  Patient/Guardian was advised Release of Information must be obtained prior to any record release in order to collaborate their care with an outside provider. Patient/Guardian was advised if they have not already done so to contact the registration department to sign all necessary forms in order for [place] to release information regarding their care.   Consent: Patient/Guardian gives verbal consent for treatment and assignment of benefits for services provided during this visit. Patient/Guardian expressed understanding and agreed  to proceed.   Televisit via video: I connected with patient on 04/06/23 at 10:00 AM EST by a video enabled telemedicine application and verified that I am speaking with the correct person using two identifiers.  Location: Patient: home address in [place] Provider: remote office in [place]   I discussed the limitations of evaluation and management by telemedicine and the availability of in person appointments. The patient expressed understanding and agreed to proceed.  I discussed the assessment and treatment plan with the patient. The patient was provided an opportunity to ask questions and all were answered. The  patient agreed with the plan and demonstrated an understanding of the instructions.   The patient was advised to call back or seek an in-person evaluation if the symptoms worsen or if the condition fails to improve as anticipated.  I provided 35 minutes dedicated to the care of this patient via video on the date of this encounter to include chart review, face-to-face time with the patient, medication management/counseling, brief supportive psychotherapy.  Carollee Nussbaumer A Cortlandt Capuano 04/06/2023, 11:33 AM

## 2023-04-06 ENCOUNTER — Telehealth (HOSPITAL_COMMUNITY): Payer: No Payment, Other | Admitting: Psychiatry

## 2023-04-06 ENCOUNTER — Encounter (HOSPITAL_COMMUNITY): Payer: Self-pay | Admitting: Psychiatry

## 2023-04-06 DIAGNOSIS — F603 Borderline personality disorder: Secondary | ICD-10-CM

## 2023-04-06 DIAGNOSIS — Z79899 Other long term (current) drug therapy: Secondary | ICD-10-CM

## 2023-04-06 DIAGNOSIS — F319 Bipolar disorder, unspecified: Secondary | ICD-10-CM | POA: Diagnosis not present

## 2023-04-06 DIAGNOSIS — Z5181 Encounter for therapeutic drug level monitoring: Secondary | ICD-10-CM

## 2023-04-06 DIAGNOSIS — F411 Generalized anxiety disorder: Secondary | ICD-10-CM

## 2023-04-07 ENCOUNTER — Other Ambulatory Visit: Payer: Self-pay

## 2023-04-11 ENCOUNTER — Other Ambulatory Visit: Payer: Self-pay

## 2023-04-12 ENCOUNTER — Other Ambulatory Visit: Payer: Self-pay

## 2023-04-12 ENCOUNTER — Other Ambulatory Visit (HOSPITAL_COMMUNITY): Payer: Self-pay

## 2023-04-13 ENCOUNTER — Other Ambulatory Visit: Payer: Self-pay

## 2023-04-13 ENCOUNTER — Other Ambulatory Visit (INDEPENDENT_AMBULATORY_CARE_PROVIDER_SITE_OTHER): Payer: No Payment, Other

## 2023-04-13 DIAGNOSIS — F603 Borderline personality disorder: Secondary | ICD-10-CM

## 2023-04-13 DIAGNOSIS — Z79899 Other long term (current) drug therapy: Secondary | ICD-10-CM

## 2023-04-15 ENCOUNTER — Encounter (HOSPITAL_COMMUNITY): Payer: Self-pay

## 2023-04-15 LAB — CBC WITH DIFFERENTIAL/PLATELET
Basophils Absolute: 0.1 10*3/uL (ref 0.0–0.2)
Basos: 1 %
EOS (ABSOLUTE): 0.5 10*3/uL — ABNORMAL HIGH (ref 0.0–0.4)
Eos: 4 %
Hematocrit: 46.8 % — ABNORMAL HIGH (ref 34.0–46.6)
Hemoglobin: 15.4 g/dL (ref 11.1–15.9)
Immature Grans (Abs): 0 10*3/uL (ref 0.0–0.1)
Immature Granulocytes: 0 %
Lymphocytes Absolute: 3 10*3/uL (ref 0.7–3.1)
Lymphs: 25 %
MCH: 32.2 pg (ref 26.6–33.0)
MCHC: 32.9 g/dL (ref 31.5–35.7)
MCV: 98 fL — ABNORMAL HIGH (ref 79–97)
Monocytes Absolute: 0.8 10*3/uL (ref 0.1–0.9)
Monocytes: 6 %
Neutrophils Absolute: 7.4 10*3/uL — ABNORMAL HIGH (ref 1.4–7.0)
Neutrophils: 64 %
Platelets: 288 10*3/uL (ref 150–450)
RBC: 4.78 x10E6/uL (ref 3.77–5.28)
RDW: 12.3 % (ref 11.7–15.4)
WBC: 11.8 10*3/uL — ABNORMAL HIGH (ref 3.4–10.8)

## 2023-04-15 LAB — COMPREHENSIVE METABOLIC PANEL
ALT: 21 [IU]/L (ref 0–32)
AST: 17 [IU]/L (ref 0–40)
Albumin: 4.3 g/dL (ref 3.9–4.9)
Alkaline Phosphatase: 75 [IU]/L (ref 44–121)
BUN/Creatinine Ratio: 18 (ref 9–23)
BUN: 15 mg/dL (ref 6–24)
Bilirubin Total: <0.2 mg/dL (ref 0.0–1.2)
CO2: 18 mmol/L — ABNORMAL LOW (ref 20–29)
Calcium: 10 mg/dL (ref 8.7–10.2)
Chloride: 103 mmol/L (ref 96–106)
Creatinine, Ser: 0.82 mg/dL (ref 0.57–1.00)
Globulin, Total: 2.2 g/dL (ref 1.5–4.5)
Glucose: 135 mg/dL — ABNORMAL HIGH (ref 70–99)
Potassium: 4.5 mmol/L (ref 3.5–5.2)
Sodium: 138 mmol/L (ref 134–144)
Total Protein: 6.5 g/dL (ref 6.0–8.5)
eGFR: 88 mL/min/{1.73_m2} (ref >59)

## 2023-04-15 LAB — THYROID PANEL WITH TSH
Free Thyroxine Index: 2.9 (ref 1.2–4.9)
T3 Uptake Ratio: 31 % (ref 24–39)
T4, Total: 9.4 ug/dL (ref 4.5–12.0)
TSH: 1.63 u[IU]/mL (ref 0.450–4.500)

## 2023-04-15 LAB — LITHIUM LEVEL: Lithium Lvl: 0.6 mmol/L (ref 0.5–1.2)

## 2023-04-25 ENCOUNTER — Other Ambulatory Visit: Payer: Self-pay

## 2023-04-29 ENCOUNTER — Other Ambulatory Visit: Payer: Self-pay

## 2023-05-02 ENCOUNTER — Ambulatory Visit (INDEPENDENT_AMBULATORY_CARE_PROVIDER_SITE_OTHER): Payer: No Payment, Other | Admitting: Mental Health

## 2023-05-02 DIAGNOSIS — F319 Bipolar disorder, unspecified: Secondary | ICD-10-CM | POA: Diagnosis not present

## 2023-05-02 DIAGNOSIS — F411 Generalized anxiety disorder: Secondary | ICD-10-CM

## 2023-05-02 NOTE — Progress Notes (Signed)
 THERAPIST PROGRESS NOTE Virtual Visit via Video Note  I connected with Valerie Reilly on 05/02/23 at  9:00 AM EST by a video enabled telemedicine application and verified that I am speaking with the correct person using two identifiers.  Location: Patient: home address on file Provider: home office   I discussed the limitations of evaluation and management by telemedicine and the availability of in person appointments. The patient expressed understanding and agreed to proceed.  I discussed the assessment and treatment plan with the patient. The patient was provided an opportunity to ask questions and all were answered. The patient agreed with the plan and demonstrated an understanding of the instructions.   The patient was advised to call back or seek an in-person evaluation if the symptoms worsen or if the condition fails to improve as anticipated.  I provided 51 minutes of non-face-to-face time during this encounter.   Dorris Singh, [facility]   Session Time: 9:02 am (51 minutes)  Participation Level: Active  Behavioral Response: CasualAlertEuthymic  Type of Therapy: Individual Therapy  Treatment Goals addressed:  STG: "Deal better." Valerie Reilly will increase management of stress AEB ability to process events in balanced way with engagement in at least x 1 coping skills for stress as needed with the next 90 days.   ProgressTowards Goals: Progressing  Interventions: Supportive  Summary: Valerie Reilly is a 48 y.o. female who presents with dx of bipolar disorder, borderline personality disorder and generalized anxiety. Presents to session alert and oriented; mood and affect adequate. Shares for moods to have been stable and denies excessive highs or lows. Notes ongoing concern for daughter and shares for her and her guest to be disrespectful towards her. Notes to have formally became engaged to partner and shares thoughts and marital plans with therapist. Shella Reilly hx of not  wanting to get married and shares concerning first marriage as well as ex boyfriend who passed away. Shares hx of difficulty with mother and ways in which she feels she treated her father. Shares working to look for employment and explored with therapist working to have motivation to complete x 1 application daily. Denies safety concerns. Ongoing work towards goals with progress. Moods stable. Shares able to process events in balanced fashion with use of coping skills.   Suicidal/Homicidal: Nowithout intent/plan  Therapist Response: Therapist engaged Valerie Reilly in [employer]. Completed check in and assessed for current level of functioning, sxs management and stressors. Provided safe space for Valerie Reilly to share thoughts and feelings in regard to current stressors. Active empathic listening; validated feelings. Explored options and plans for housing options and ability to mange stressor with daughter. Encouraged ongoing boundaries with daughter as needed. Supported Valerie Reilly in processing hx of relationship and moving forward with marrying current partner. Affirmed ability to process thoughts in balanced manner. Reviewed session and provided follow up.   Plan: Return again in x 3 weeks.  Diagnosis: Bipolar I disorder (HCC)  GAD (generalized anxiety disorder)  Collaboration of Care: Other None  Patient/Guardian was advised Release of Information must be obtained prior to any record release in order to collaborate their care with an outside provider. Patient/Guardian was advised if they have not already done so to contact the registration department to sign all necessary forms in order for [place] to release information regarding their care.   Consent: Patient/Guardian gives verbal consent for treatment and assignment of benefits for services provided during this visit. Patient/Guardian expressed understanding and agreed to proceed.   Dorris Singh,  [facility] 05/02/2023

## 2023-05-06 NOTE — Progress Notes (Signed)
 BH MD Outpatient Progress Note  05/09/2023 2:29 PM Valerie Reilly  MRN:  [id number]  Assessment:  Valerie Reilly presents for follow-up evaluation. Today, 05/09/23, patient resolution of prior irritability and SI and reports overall stability of mood. She reflects on role that ongoing interpersonal stress with daughter has on mood reactivity however feels she has been able to manage this overall better from prior. She reports resolution of prior brain fog upon reduction in gabapentin and trazodone. Goal to continue trazodone reduction in the future however patient opts to continue at current dosing for time being. Labs for lithium monitoring reviewed and wnl. No changes to plan of care at this time.    RTC in 2 months by video.  Identifying Information: Valerie Reilly is a 48 y.o. female with a history of bipolar 1 disorder, borderline personality disorder, GAD, T2DM, hypothyroidism on Synthroid, HTN, and HLD who is an established patient with [facility] participating in follow-up via video conferencing.   Plan:  # Historical diagnosis of bipolar 1 disorder  GAD # Borderline personality disorder Past medication trials: unknown Status of problem: recent exacerbation Interventions: -- Continue lithium 600 mg BID (patient prefers 300 mg capsules as currently rx) -- Continue buspirone 30 mg 2 times daily -- Continue gabapentin 100 mg nightly -- Continue Atarax 25 mg TID PRN anxiety (using infrequently) -- Continue individual psychotherapy with [facility]  # Sleep regulation Past medication trials: unknown Status of problem: stable Interventions: -- Continue trazodone 300 mg at bedtime (patient recently self-reduced with improvement in brain fog)  # High risk medication use Interventions: -- Lithium:  -- Kidney function within normal limits 04/13/23  -- TSH within normal limits 04/13/23  -- Lithium level 0.6 04/13/23  Patient was given contact  information for behavioral health clinic and was instructed to call 911 for emergencies.   Subjective:  Chief Complaint:  Chief Complaint  Patient presents with   Medication Management    Interval History:   Patient reports she is doing "good" and things have been a lot better. Shares she got engaged. Continues to face interpersonal stress related to daughter; manages by trying to keep her distance. Continuing to look for jobs. Denies SI. Using Atarax occasionally and finds it helpful.   Has reduced trazodone and gabapentin; notes resolution in daytime grogginess and clouding. Getting about 6-8 hours sleep nightly.   No questions/concerns. Amenable to continuing medications as prescribed.   Visit Diagnosis:    ICD-10-CM   1. Borderline personality disorder (HCC)  F60.3     2. GAD (generalized anxiety disorder)  F41.1 busPIRone (BUSPAR) 30 MG tablet    gabapentin (NEURONTIN) 100 MG capsule    3. Bipolar I disorder (HCC)  F31.9 lithium carbonate 300 MG capsule    traZODone (DESYREL) 150 MG tablet    4. Insomnia due to other mental disorder  F51.05 traZODone (DESYREL) 150 MG tablet   F99       Past Psychiatric History:  Diagnoses: Historical diagnosis of bipolar 1 disorder, borderline personality disorder, cannabis use disorder in remission Medication trials: Has been on current medication regimen since at least 2016 with noted benefit Hospitalizations: multiple - last hospitalization at [facility] in 2014 for episode of mania Suicide attempt: yes - many years ago; hx of 2-3 previous attempts  Substance use:   -- Etoh: denies  -- Denies use of cannabis or other illicit drugs  -- Tobacco: 2 ppd; precontemplative regarding cessation  Past Medical History:  Past Medical History:  Diagnosis Date   Allergy    Anxiety    Asthma    Bipolar disorder (HCC)    Diabetes mellitus    Diabetes mellitus, type II (HCC)    Gallstone    GERD (gastroesophageal reflux disease)    History  of borderline personality disorder    Hypercholesteremia    Hypertension    Obesity    Psoriasis    Psoriasis    Seasonal allergies    Thyroid disease     Past Surgical History:  Procedure Laterality Date   CHOLECYSTECTOMY N/A 10/02/2021   Procedure: LAPAROSCOPIC CHOLECYSTECTOMY;  Surgeon: Lewie Chamber, DO;  Location: [facility];  Service: General;  Laterality: N/A;   TONSILLECTOMY     TUBAL LIGATION      Family Psychiatric History:  Maternal uncle: depression, alcohol use Mother: personality disorder  Family History:  Family History  Problem Relation Age of Onset   Personality disorder Mother    Diabetes Mother    Diabetes Father    Alcohol abuse Maternal Uncle    Depression Maternal Uncle    Diabetes Maternal Grandfather    Breast cancer Neg Hx    Stomach cancer Neg Hx    Colon cancer Neg Hx    Esophageal cancer Neg Hx    Colon polyps Neg Hx    Crohn's disease Neg Hx    Ulcerative colitis Neg Hx    Rectal cancer Neg Hx     Social History:  Social History   Socioeconomic History   Marital status: Divorced    Spouse name: Not on file   Number of children: 1   Years of education: Not on file   Highest education level: Associate degree: occupational, Scientist, product/process development, or vocational program  Occupational History   Occupation: delivery newspaper  Tobacco Use   Smoking status: Every Day    Current packs/day: 2.00    Average packs/day: 2.0 packs/day for 30.0 years (60.0 ttl pk-yrs)    Types: Cigarettes    Passive exposure: Current   Smokeless tobacco: Never  Vaping Use   Vaping status: Never Used  Substance and Sexual Activity   Alcohol use: Yes    Comment: occasional- a few times a year   Drug use: No    Types: Marijuana    Comment: last time was 3 yrs ago   Sexual activity: Yes    Birth control/protection: Surgical    Comment: Tubal ligation  Other Topics Concern   Not on file  Social History Narrative   Not on file   Social Drivers of Health    Financial Resource Strain: Low Risk  (12/15/2022)   Overall Financial Resource Strain (CARDIA)    Difficulty of Paying Living Expenses: Not hard at all  Food Insecurity: No Food Insecurity (12/15/2022)   Hunger Vital Sign    Worried About Running Out of Food in the Last Year: Never true    Ran Out of Food in the Last Year: Never true  Transportation Needs: No Transportation Needs (12/15/2022)   PRAPARE - Administrator, Civil Service (Medical): No    Lack of Transportation (Non-Medical): No  Physical Activity: Inactive (12/15/2022)   Exercise Vital Sign    Days of Exercise per Week: 0 days    Minutes of Exercise per Session: 0 min  Stress: No Stress Concern Present (12/15/2022)   [employer] of Occupational Health - Occupational Stress Questionnaire    Feeling of Stress : Not at all  Social Connections: Socially Isolated (12/15/2022)   Social Connection and Isolation Panel [NHANES]    Frequency of Communication with Friends and Family: More than three times a week    Frequency of Social Gatherings with Friends and Family: Once a week    Attends Religious Services: Never    Database administrator or Organizations: No    Attends Engineer, structural: Never    Marital Status: Divorced    Allergies: No Known Allergies  Current Medications: Current Outpatient Medications  Medication Sig Dispense Refill   hydrOXYzine (VISTARIL) 25 MG capsule Take 1 capsule (25 mg total) by mouth every 8 (eight) hours as needed. 15 capsule 0   albuterol (VENTOLIN HFA) 108 (90 Base) MCG/ACT inhaler Inhale 2 puffs into the lungs every 6 (six) hours as needed for wheezing or shortness of breath. 6.7 g 2   amoxicillin (AMOXIL) 875 MG tablet Take 1 tablet (875 mg total) by mouth every 12 (twelve) hours. (Patient not taking: Reported on 04/05/2023) 10 tablet 0   benazepril (LOTENSIN) 20 MG tablet Take 1 tablet (20 mg total) by mouth daily. 90 tablet 1   busPIRone (BUSPAR) 30 MG tablet  Take 1 tablet (30 mg total) by mouth 2 (two) times daily. 180 tablet 1   chlorhexidine (PERIDEX) 0.12 % solution RINSE MOUTH WITH (1 CAPFUL) FOR 30 SECONDS AM AND PM AFTER TOOTHBRUSHING. EXPECTORATE AFTER RINSING, DO NOT SWALLOW (Patient not taking: Reported on 04/05/2023) 473 mL 0   cholecalciferol (VITAMIN D) 1000 UNITS tablet Take 1,000 Units by mouth daily.     famotidine (PEPCID) 20 MG tablet Take 1 tablet (20 mg total) by mouth daily. 90 tablet 1   fexofenadine (ALLEGRA) 180 MG tablet Take 1 tablet (180 mg total) by mouth daily. 90 tablet 1   fluticasone (FLONASE) 50 MCG/ACT nasal spray Place 2 sprays into both nostrils daily. 16 g 2   gabapentin (NEURONTIN) 100 MG capsule Take 1 capsule (100 mg total) by mouth at bedtime. 90 capsule 1   glipiZIDE (GLUCOTROL XL) 10 MG 24 hr tablet Take 1 tablet (10 mg total) by mouth daily with breakfast. 90 tablet 0   guselkumab (TREMFYA) 100 MG/ML prefilled syringe Inject 1 syringe (100mg /39ml) into the skin every 8 weeks 1 mL 11   HYDROcodone-acetaminophen (NORCO) 7.5-325 MG tablet TAKE 1 TABLET EVERY 6 HOURS AS NEEDED. (Patient not taking: Reported on 04/05/2023) 8 tablet 0   hydroxypropyl methylcellulose / hypromellose (ISOPTO TEARS / GONIOVISC) 2.5 % ophthalmic solution Place 1 drop into both eyes in the morning and at bedtime.     levothyroxine (SYNTHROID) 50 MCG tablet Take 1 tablet (50 mcg total) by mouth daily before breakfast. 90 tablet 1   lidocaine (LIDODERM) 5 % Place 1 patch onto the skin daily. Remove & Discard patch within 12 hours daily, to lower back (Patient not taking: Reported on 04/05/2023) 30 patch 0   lithium carbonate 300 MG capsule Take 2 capsules (600 mg total) by mouth in the morning and at bedtime. 360 capsule 1   LORazepam (ATIVAN) 2 MG tablet PLEASE ARRIVE IN THE OFFICE 30 MINUTES PRIOR TO APPOINTMENT AND BRING THIS MEDICATION WITH YOU. DO NOT TAKE UNTIL YOU HAVE BEEN CHECKED IN. (Patient not taking: Reported on 04/06/2023) 2  tablet 0   metFORMIN (GLUCOPHAGE) 1000 MG tablet Take 1 tablet (1,000 mg total) by mouth 2 (two) times daily with a meal. 180 tablet 0   methocarbamol (ROBAXIN) 500 MG tablet Take 1 tablet (500 mg  total) by mouth every 6 (six) hours as needed for muscle spasms. 60 tablet 1   Multiple Vitamin (MULTIVITAMIN) capsule Take 1 capsule by mouth daily.     nicotine (NICODERM CQ - DOSED IN MG/24 HOURS) 21 mg/24hr patch Place 1 patch (21 mg total) onto the skin daily. 42 patch 0   Probiotic Product (PROBIOTIC BLEND PO) Take 1 capsule by mouth daily. (Patient not taking: Reported on 04/05/2023)     simvastatin (ZOCOR) 40 MG tablet Take 1 tablet (40 mg total) by mouth daily. 90 tablet 0   traZODone (DESYREL) 150 MG tablet Take 2 tablets (300 mg total) by mouth at bedtime. 180 tablet 1   No current facility-administered medications for this visit.    ROS: Denies any physical complaints  Objective:  Psychiatric Specialty Exam: There were no vitals taken for this visit.There is no height or weight on file to calculate BMI.  General Appearance: Casual and Fairly Groomed  Eye Contact:  Good  Speech:  Clear and Coherent and Normal Rate  Volume:  Normal  Mood:   "better"  Affect:   Euthymic; calm  Thought Content:  Denies AVH; IOR; paranoia    Suicidal Thoughts:   Denies  Homicidal Thoughts:  No  Thought Process:  Goal Directed and Linear  Orientation:  Full (Time, Place, and Person)    Memory:   Grossly oriented  Judgment:  Good  Insight:  Good  Concentration:  Concentration: Good  Recall:  NA  Fund of Knowledge: Good  Language: Good  Psychomotor Activity:  Normal  Akathisia:  NA  AIMS (if indicated): not done  Assets:  Communication Skills Desire for Improvement Housing Intimacy Leisure Time Social Support Vocational/Educational  ADL's:  Intact  Cognition: WNL  Sleep:  Good   PE: General: sits comfortably in view of camera; no acute distress  Pulm: no increased work of breathing  on room air  MSK: all extremity movements appear intact  Neuro: no focal neurological deficits observed  Gait & Station: unable to assess by video    Metabolic Disorder Labs: Lab Results  Component Value Date   HGBA1C 5.9 (H) 12/15/2022   No results found for: "PROLACTIN" Lab Results  Component Value Date   CHOL 135 12/15/2022   TRIG 109 12/15/2022   HDL 41 12/15/2022   CHOLHDL 3.3 12/15/2022   LDLCALC 74 12/15/2022   LDLCALC 54 06/10/2022   Lab Results  Component Value Date   TSH 1.630 04/13/2023   TSH 1.200 12/15/2022    Therapeutic Level Labs: Lab Results  Component Value Date   LITHIUM 0.6 04/13/2023   LITHIUM 0.5 06/10/2022   No results found for: "VALPROATE" No results found for: "CBMZ"  Screenings:  AUDIT    Flowsheet Row Office Visit from 06/10/2022 in [facility] [place] - [facility] Dept Of [place]. [facility]  Alcohol Use Disorder Identification Test Final Score (AUDIT) 5      GAD-7    Flowsheet Row Office Visit from 04/05/2023 in [facility] Office Visit from 12/15/2022 in [facility] [place] - A Dept Of [place]. [facility] Office Visit from 10/15/2022 in [facility] A Dept Of [place]. [facility] Counselor from 08/09/2022 in [facility] Office Visit from 06/10/2022 in [facility] [place] - A Dept Of [place]. [facility]  Total GAD-7 Score 11 0 0 13 0  [id number]    Flowsheet Row Office Visit from 04/05/2023 in [facility] Office Visit from 12/15/2022 in [facility] [place] - A Dept Of [place]. [facility] Office Visit from 10/15/2022 in [facility] A Dept Of [place]. [facility] Counselor from 08/09/2022 in [facility] Office Visit from 06/10/2022 in [facility] [place] - A Dept Of [place]. [facility]  PHQ-2 Total Score 4 0 0 5 0  PHQ-9 Total Score 9 0 -- 15 0      Flowsheet Row Video Visit from 11/05/2021 in [facility] Pre-Admission Testing 60 from 09/29/2021 in [place] [facility] Video Visit from 06/12/2021 in [facility]  C-SSRS RISK CATEGORY No Risk No Risk Low Risk       Collaboration of Care: Collaboration of Care: Medication Management AEB ongoing medication management, Psychiatrist AEB established with this provider, and Referral or follow-up with counselor/therapist AEB established with individual therapist  Patient/Guardian was advised Release of Information must be obtained prior to any record release in order to collaborate their care with an outside provider. Patient/Guardian was advised if they have not already done so to contact the registration department to sign all necessary forms in order for [place] to release information regarding their care.   Consent: Patient/Guardian gives verbal consent for treatment and assignment of benefits for services provided during this visit. Patient/Guardian expressed understanding and agreed to proceed.   Televisit via video: I connected with patient on 05/09/23 at  2:00 PM EST by a video enabled telemedicine application and verified that I am speaking with the correct person using two identifiers.  Location: Patient: home address in [place] Provider: remote office in [place]   I discussed the limitations of evaluation and management by telemedicine and the availability of in person appointments. The patient expressed understanding and agreed to proceed.  I discussed the assessment and treatment plan with the patient. The patient was provided an opportunity to ask questions and all were answered. The patient agreed with the plan and demonstrated an understanding of the instructions.   The patient was advised to call back or seek an in-person evaluation if the  symptoms worsen or if the condition fails to improve as anticipated.  I provided 20 minutes dedicated to the care of this patient via video on the date of this encounter to include chart review, face-to-face time with the patient, medication management/counseling, brief supportive psychotherapy.  Valerie Reilly 05/09/2023, 2:29 PM

## 2023-05-09 ENCOUNTER — Other Ambulatory Visit: Payer: Self-pay

## 2023-05-09 ENCOUNTER — Telehealth (INDEPENDENT_AMBULATORY_CARE_PROVIDER_SITE_OTHER): Payer: No Payment, Other | Admitting: Psychiatry

## 2023-05-09 ENCOUNTER — Encounter (HOSPITAL_COMMUNITY): Payer: Self-pay | Admitting: Psychiatry

## 2023-05-09 DIAGNOSIS — F5105 Insomnia due to other mental disorder: Secondary | ICD-10-CM | POA: Diagnosis not present

## 2023-05-09 DIAGNOSIS — F603 Borderline personality disorder: Secondary | ICD-10-CM

## 2023-05-09 DIAGNOSIS — F99 Mental disorder, not otherwise specified: Secondary | ICD-10-CM

## 2023-05-09 DIAGNOSIS — F411 Generalized anxiety disorder: Secondary | ICD-10-CM | POA: Diagnosis not present

## 2023-05-09 DIAGNOSIS — F319 Bipolar disorder, unspecified: Secondary | ICD-10-CM | POA: Diagnosis not present

## 2023-05-09 MED ORDER — GABAPENTIN 100 MG PO CAPS
100.0000 mg | ORAL_CAPSULE | Freq: Every evening | ORAL | 1 refills | Status: DC
  Filled 2023-05-09: qty 90, 90d supply, fill #0

## 2023-05-09 MED ORDER — LITHIUM CARBONATE 300 MG PO CAPS
600.0000 mg | ORAL_CAPSULE | Freq: Two times a day (BID) | ORAL | 1 refills | Status: DC
  Filled 2023-05-09: qty 360, 90d supply, fill #0

## 2023-05-09 MED ORDER — BUSPIRONE HCL 30 MG PO TABS
30.0000 mg | ORAL_TABLET | Freq: Two times a day (BID) | ORAL | 1 refills | Status: DC
  Filled 2023-05-09 – 2023-05-30 (×2): qty 180, 90d supply, fill #0

## 2023-05-09 MED ORDER — TRAZODONE HCL 150 MG PO TABS
300.0000 mg | ORAL_TABLET | Freq: Every day | ORAL | 1 refills | Status: DC
Start: 2023-05-09 — End: 2023-07-13
  Filled 2023-05-09: qty 180, 90d supply, fill #0
  Filled 2023-06-06: qty 60, 30d supply, fill #0

## 2023-05-09 NOTE — Patient Instructions (Signed)

## 2023-05-16 ENCOUNTER — Ambulatory Visit (INDEPENDENT_AMBULATORY_CARE_PROVIDER_SITE_OTHER): Payer: No Payment, Other | Admitting: Mental Health

## 2023-05-16 DIAGNOSIS — F411 Generalized anxiety disorder: Secondary | ICD-10-CM

## 2023-05-16 DIAGNOSIS — F319 Bipolar disorder, unspecified: Secondary | ICD-10-CM

## 2023-05-16 NOTE — Progress Notes (Signed)
   THERAPIST PROGRESS NOTE Virtual Visit via Video Note  I connected with Valerie Reilly on 05/16/23 at  9:00 AM EDT by a video enabled telemedicine application and verified that I am speaking with the correct person using two identifiers.  Location: Patient: home address on file Provider: home office   I discussed the limitations of evaluation and management by telemedicine and the availability of in person appointments. The patient expressed understanding and agreed to proceed.  I discussed the assessment and treatment plan with the patient. The patient was provided an opportunity to ask questions and all were answered. The patient agreed with the plan and demonstrated an understanding of the instructions.   The patient was advised to call back or seek an in-person evaluation if the symptoms worsen or if the condition fails to improve as anticipated.  I provided 53 minutes of non-face-to-face time during this encounter.   Valerie Reilly, [facility]   Session Time: 9:00 am    Participation Level: Active  Behavioral Response: CasualAlertEuthymic  Type of Therapy: Individual Therapy  Treatment Goals addressed: STG: "Deal better." Valerie Reilly will increase management of stress AEB ability to process events in balanced way with engagement in at least x 1 coping skills for stress as needed with the next 90 days.   ProgressTowards Goals: Progressing  Interventions: Supportive  Summary:  Valerie Reilly is a 48 y.o. female who presents with dx of bipolar disorder, borderline personality disorder and generalized anxiety. Presents to session alert and oriented; mood and affect adequate. Shares for moods to have been stable and denies excessive highs or lows. Notes ongoing concern for daughter and shares would like to explore options of ability to no longer live with daughter. Shares with therapist difficulties with daughter and feeling disrespected. Shares concern for finances with only  receiving unemployment benefits for x 12 weeks with that due to run out. Explored options of employment with therapist. Engages in solution building and agrees to follow up with unemployment for possible extension and following up with job leads. Shares working to manage stressors with daughter and processing thoughts in balanced manner. Denies safety concerns.    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 Completed check in and assessed for current level of functioning, sxs management and stressors. Provided safe space for Valerie Reilly to share thoughts and feelings in regard to current stressors. Active empathic listening. Supported in exploring options for housing and employment.. Encouraged following up on possible solutions. Reviewed session and provided follow up.   Plan: Return again in  x 4 weeks.  Diagnosis: Bipolar I disorder (HCC)  GAD (generalized anxiety disorder)  Collaboration of Care: Other None  Patient/Guardian was advised Release of Information must be obtained prior to any record release in order to collaborate their care with an outside provider. Patient/Guardian was advised if they have not already done so to contact the registration department to sign all necessary forms in order for [place] to release information regarding their care.   Consent: Patient/Guardian gives verbal consent for treatment and assignment of benefits for services provided during this visit. Patient/Guardian expressed understanding and agreed to proceed.   Valerie Reilly [place], [facility] 05/16/2023

## 2023-05-18 ENCOUNTER — Other Ambulatory Visit: Payer: Self-pay

## 2023-05-30 ENCOUNTER — Other Ambulatory Visit: Payer: Self-pay

## 2023-06-02 ENCOUNTER — Other Ambulatory Visit: Payer: Self-pay

## 2023-06-06 ENCOUNTER — Other Ambulatory Visit: Payer: Self-pay | Admitting: Family Medicine

## 2023-06-06 ENCOUNTER — Other Ambulatory Visit: Payer: Self-pay

## 2023-06-06 DIAGNOSIS — E119 Type 2 diabetes mellitus without complications: Secondary | ICD-10-CM

## 2023-06-07 ENCOUNTER — Other Ambulatory Visit: Payer: Self-pay

## 2023-06-07 MED ORDER — METFORMIN HCL 1000 MG PO TABS
1000.0000 mg | ORAL_TABLET | Freq: Two times a day (BID) | ORAL | 0 refills | Status: DC
  Filled 2023-06-07: qty 60, 30d supply, fill #0

## 2023-06-09 ENCOUNTER — Other Ambulatory Visit: Payer: Self-pay

## 2023-06-09 ENCOUNTER — Other Ambulatory Visit: Payer: Self-pay | Admitting: Family Medicine

## 2023-06-09 DIAGNOSIS — E119 Type 2 diabetes mellitus without complications: Secondary | ICD-10-CM

## 2023-06-09 DIAGNOSIS — E7800 Pure hypercholesterolemia, unspecified: Secondary | ICD-10-CM

## 2023-06-10 ENCOUNTER — Other Ambulatory Visit: Payer: Self-pay

## 2023-06-10 ENCOUNTER — Telehealth (HOSPITAL_COMMUNITY): Payer: No Payment, Other | Admitting: Psychiatry

## 2023-06-10 MED ORDER — SIMVASTATIN 40 MG PO TABS
40.0000 mg | ORAL_TABLET | Freq: Every day | ORAL | 0 refills | Status: DC
  Filled 2023-06-10: qty 30, 30d supply, fill #0

## 2023-06-10 MED ORDER — GLIPIZIDE ER 10 MG PO TB24
10.0000 mg | ORAL_TABLET | Freq: Every day | ORAL | 0 refills | Status: DC
  Filled 2023-06-10: qty 30, 30d supply, fill #0

## 2023-06-13 ENCOUNTER — Other Ambulatory Visit: Payer: Self-pay

## 2023-06-14 ENCOUNTER — Ambulatory Visit: Payer: Self-pay | Admitting: Family Medicine

## 2023-06-14 NOTE — Progress Notes (Unsigned)
 BH MD Outpatient Progress Note  06/15/2023 4:35 PM Valerie Reilly Stage  MRN:  [id number]  Assessment:  Valerie Reilly presents for follow-up evaluation. Today, 06/15/23, patient endorses continued mood symptoms in form of low mood and irritability, decrease in energy and motivation, and decline in self care concerning for current depressive episode. She reports occasional passive SI but denies active SI or SIB; no acute safety concern. Initially, changes in mood thought to be 2/2 loss of job and conflict with daughter however given persistence will pursue further titration of lithium as below. Patient reports currently going through menopause which may be additional precipitating factor to mood changes in the last several months. She continues to report issues with short-term memory; discussed long term goal to reduce sedating medications although will see how memory issues respond once depression is better controlled.   RTC in 4 weeks by video.  Identifying Information: Valerie Reilly is a 48 y.o. female with a history of bipolar 1 disorder, borderline personality disorder, GAD, T2DM, hypothyroidism on Synthroid, HTN, and HLD who is an established patient with [facility] participating in follow-up via video conferencing.   Plan:  # Historical diagnosis of bipolar 1 disorder  GAD # Borderline personality disorder Past medication trials: unknown Status of problem: recent exacerbation Interventions: -- INCREASE lithium from 600 mg BID to 600 mg qAM + 900 mg at bedtime (patient prefers 300 mg capsules as currently rx) -- Continue buspirone 30 mg 2 times daily -- Continue gabapentin 100 mg BID -- Continue Atarax 25 mg TID PRN anxiety (using infrequently) -- Continue individual psychotherapy with [facility]  # Sleep regulation Past medication trials: unknown Status of problem: stable Interventions: -- Continue trazodone 300 mg at bedtime   # High risk  medication use Interventions: -- Lithium:  -- Kidney function within normal limits 04/13/23  -- TSH within normal limits 04/13/23  -- Lithium level 0.6 04/13/23; repeat level ordered given increase in dose  Patient was given contact information for behavioral health clinic and was instructed to call 911 for emergencies.   Subjective:  Chief Complaint:  Chief Complaint  Patient presents with   Medication Management    Interval History:   Reports she hasn't been doing too good. Reports more irritability and just "not like [herself]." Denies recent signs/sx of mania. Reports feeling more low lately with decrease in self-care - not showering as frequently. Less enjoyment from activities. Reports occasional passive SI but denies active SI. Denies SIB urges.   Currently going through menopause; last period in Feb 2024. Experiencing cold flashes, dry skin, vaginal dryness.   Has been sleeping well with about 6-7 hours nightly; taking trazodone 300 mg nightly. Appetite and weight have been stable.   Reports she continues to struggle with memory issues. This has been an issue for the last year or so. Typically with short term memory; denies issues with long term memory. Denies feeling particularly anxious during these moments.   Amenable to further titration of lithium at this time to target irritability and depressive symptoms.   Visit Diagnosis:    ICD-10-CM   1. Bipolar I disorder (HCC)  F31.9 lithium carbonate 300 MG capsule    Lithium level    2. GAD (generalized anxiety disorder)  F41.1 gabapentin (NEURONTIN) 100 MG capsule    3. High risk medication use  Z79.899 Lithium level     Past Psychiatric History:  Diagnoses: Historical diagnosis of bipolar 1 disorder, borderline personality disorder, cannabis use  disorder in remission Medication trials: Has been on current medication regimen since at least 2016 with noted benefit Hospitalizations: multiple - last hospitalization at [facility]  in 2014 for episode of mania Suicide attempt: yes - many years ago; hx of 2-3 previous attempts  Substance use:   -- Etoh: denies  -- Denies use of cannabis or other illicit drugs  -- Tobacco: 2 ppd; precontemplative regarding cessation  Past Medical History:  Past Medical History:  Diagnosis Date   Allergy    Anxiety    Asthma    Bipolar disorder (HCC)    Diabetes mellitus    Diabetes mellitus, type II (HCC)    Gallstone    GERD (gastroesophageal reflux disease)    History of borderline personality disorder    Hypercholesteremia    Hypertension    Obesity    Psoriasis    Psoriasis    Seasonal allergies    Thyroid disease     Past Surgical History:  Procedure Laterality Date   CHOLECYSTECTOMY N/A 10/02/2021   Procedure: LAPAROSCOPIC CHOLECYSTECTOMY;  Surgeon: Lewie Chamber, DO;  Location: [facility];  Service: General;  Laterality: N/A;   TONSILLECTOMY     TUBAL LIGATION      Family Psychiatric History:  Maternal uncle: depression, alcohol use Mother: personality disorder  Family History:  Family History  Problem Relation Age of Onset   Personality disorder Mother    Diabetes Mother    Diabetes Father    Alcohol abuse Maternal Uncle    Depression Maternal Uncle    Diabetes Maternal Grandfather    Breast cancer Neg Hx    Stomach cancer Neg Hx    Colon cancer Neg Hx    Esophageal cancer Neg Hx    Colon polyps Neg Hx    Crohn's disease Neg Hx    Ulcerative colitis Neg Hx    Rectal cancer Neg Hx     Social History:  Social History   Socioeconomic History   Marital status: Divorced    Spouse name: Not on file   Number of children: 1   Years of education: Not on file   Highest education level: Associate degree: occupational, Scientist, product/process development, or vocational program  Occupational History   Occupation: delivery newspaper  Tobacco Use   Smoking status: Every Day    Current packs/day: 2.00    Average packs/day: 2.0 packs/day for 30.0 years (60.0 ttl pk-yrs)     Types: Cigarettes    Passive exposure: Current   Smokeless tobacco: Never  Vaping Use   Vaping status: Never Used  Substance and Sexual Activity   Alcohol use: Yes    Comment: occasional- a few times a year   Drug use: No    Types: Marijuana    Comment: last time was 3 yrs ago   Sexual activity: Yes    Birth control/protection: Surgical    Comment: Tubal ligation  Other Topics Concern   Not on file  Social History Narrative   Not on file   Social Drivers of Health   Financial Resource Strain: Low Risk  (12/15/2022)   Overall Financial Resource Strain (CARDIA)    Difficulty of Paying Living Expenses: Not hard at all  Food Insecurity: No Food Insecurity (12/15/2022)   Hunger Vital Sign    Worried About Running Out of Food in the Last Year: Never true    Ran Out of Food in the Last Year: Never true  Transportation Needs: No Transportation Needs (12/15/2022)   PRAPARE - Transportation  Lack of Transportation (Medical): No    Lack of Transportation (Non-Medical): No  Physical Activity: Inactive (12/15/2022)   Exercise Vital Sign    Days of Exercise per Week: 0 days    Minutes of Exercise per Session: 0 min  Stress: No Stress Concern Present (12/15/2022)   [employer] of Occupational Health - Occupational Stress Questionnaire    Feeling of Stress : Not at all  Social Connections: Socially Isolated (12/15/2022)   Social Connection and Isolation Panel [NHANES]    Frequency of Communication with Friends and Family: More than three times a week    Frequency of Social Gatherings with Friends and Family: Once a week    Attends Religious Services: Never    Database administrator or Organizations: No    Attends Engineer, structural: Never    Marital Status: Divorced    Allergies: No Known Allergies  Current Medications: Current Outpatient Medications  Medication Sig Dispense Refill   busPIRone (BUSPAR) 30 MG tablet Take 1 tablet (30 mg total) by mouth 2 (two)  times daily. 180 tablet 1   traZODone (DESYREL) 150 MG tablet Take 2 tablets (300 mg total) by mouth at bedtime. 180 tablet 1   albuterol (VENTOLIN HFA) 108 (90 Base) MCG/ACT inhaler Inhale 2 puffs into the lungs every 6 (six) hours as needed for wheezing or shortness of breath. 6.7 g 2   benazepril (LOTENSIN) 20 MG tablet Take 1 tablet (20 mg total) by mouth daily. 90 tablet 1   cholecalciferol (VITAMIN D) 1000 UNITS tablet Take 1,000 Units by mouth daily.     famotidine (PEPCID) 20 MG tablet Take 1 tablet (20 mg total) by mouth daily. 90 tablet 1   fexofenadine (ALLEGRA) 180 MG tablet Take 1 tablet (180 mg total) by mouth daily. 90 tablet 1   fluticasone (FLONASE) 50 MCG/ACT nasal spray Place 2 sprays into both nostrils daily. 16 g 2   gabapentin (NEURONTIN) 100 MG capsule Take 1 capsule (100 mg total) by mouth 2 (two) times daily. 180 capsule 1   glipiZIDE (GLUCOTROL XL) 10 MG 24 hr tablet Take 1 tablet (10 mg total) by mouth daily with breakfast. 30 tablet 0   guselkumab (TREMFYA) 100 MG/ML prefilled syringe Inject 1 syringe (100mg /56ml) into the skin every 8 weeks (Patient not taking: Reported on 06/15/2023) 1 mL 11   hydroxypropyl methylcellulose / hypromellose (ISOPTO TEARS / GONIOVISC) 2.5 % ophthalmic solution Place 1 drop into both eyes in the morning and at bedtime.     hydrOXYzine (VISTARIL) 25 MG capsule Take 1 capsule (25 mg total) by mouth every 8 (eight) hours as needed. 15 capsule 0   levothyroxine (SYNTHROID) 50 MCG tablet Take 1 tablet (50 mcg total) by mouth daily before breakfast. 90 tablet 1   lidocaine (LIDODERM) 5 % Place 1 patch onto the skin daily. Remove & Discard patch within 12 hours daily, to lower back 30 patch 0   lithium carbonate 300 MG capsule Take 2 capsules (600 mg total) by mouth in the morning AND 3 capsules (900 mg total) at bedtime. 450 capsule 1   LORazepam (ATIVAN) 2 MG tablet PLEASE ARRIVE IN THE OFFICE 30 MINUTES PRIOR TO APPOINTMENT AND BRING THIS MEDICATION  WITH YOU. DO NOT TAKE UNTIL YOU HAVE BEEN CHECKED IN. (Patient not taking: Reported on 04/05/2023) 2 tablet 0   metFORMIN (GLUCOPHAGE) 1000 MG tablet Take 1 tablet (1,000 mg total) by mouth 2 (two) times daily with a meal. 180 tablet 1  Multiple Vitamin (MULTIVITAMIN) capsule Take 1 capsule by mouth daily.     simvastatin (ZOCOR) 40 MG tablet Take 1 tablet (40 mg total) by mouth daily. 90 tablet 1   No current facility-administered medications for this visit.    ROS: Denies any physical complaints  Objective:  Psychiatric Specialty Exam: There were no vitals taken for this visit.There is no height or weight on file to calculate BMI.  General Appearance: Casual and Fairly Groomed  Eye Contact:  Good  Speech:  Clear and Coherent and Normal Rate  Volume:  Normal  Mood:   "not like myself"  Affect:   Euthymic; calm  Thought Content:  Denies AVH; IOR; paranoia    Suicidal Thoughts:   Reports occasional passive SI; denies active SI  Homicidal Thoughts:  No  Thought Process:  Goal Directed and Linear  Orientation:  Full (Time, Place, and Person)    Memory:   Grossly oriented  Judgment:  Good  Insight:  Fair  Concentration:  Concentration: Good  Recall:  NA  Fund of Knowledge: Good  Language: Good  Psychomotor Activity:  Normal  Akathisia:  NA  AIMS (if indicated): not done  Assets:  Communication Skills Desire for Improvement Housing Intimacy Leisure Time Social Support Vocational/Educational  ADL's:  Intact  Cognition: WNL  Sleep:  Good   PE: General: sits comfortably in view of camera; no acute distress  Pulm: no increased work of breathing on room air  MSK: all extremity movements appear intact  Neuro: no focal neurological deficits observed  Gait & Station: unable to assess by video    Metabolic Disorder Labs: Lab Results  Component Value Date   HGBA1C 5.9 (H) 12/15/2022   No results found for: "PROLACTIN" Lab Results  Component Value Date   CHOL 135  12/15/2022   TRIG 109 12/15/2022   HDL 41 12/15/2022   CHOLHDL 3.3 12/15/2022   LDLCALC 74 12/15/2022   LDLCALC 54 06/10/2022   Lab Results  Component Value Date   TSH 1.630 04/13/2023   TSH 1.200 12/15/2022    Therapeutic Level Labs: Lab Results  Component Value Date   LITHIUM 0.6 04/13/2023   LITHIUM 0.5 06/10/2022   No results found for: "VALPROATE" No results found for: "CBMZ"  Screenings:  AUDIT    Flowsheet Row Office Visit from 06/10/2022 in [facility] [place] - A Dept Of [place]. [facility]  Alcohol Use Disorder Identification Test Final Score (AUDIT) 5      GAD-7    Flowsheet Row Office Visit from 06/15/2023 in [facility] [place] - A Dept Of [place]. [facility] Office Visit from 04/05/2023 in [facility] Office Visit from 12/15/2022 in [facility] [place] - A Dept Of [place]. [facility] Office Visit from 10/15/2022 in [facility] [place] - A Dept Of [place]. [facility] Counselor from 08/09/2022 in [facility]  Total GAD-7 Score 13 11 0 0 13      PHQ2-9    Flowsheet Row Office Visit from 06/15/2023 in [facility] [place] - A Dept Of [place]. [facility] Office Visit from 04/05/2023 in [facility] Office Visit from 12/15/2022 in [facility] [place] - A Dept Of [place]. [facility] Office Visit from 10/15/2022 in [facility] - A Dept Of . [facility]  Hospital Counselor from 08/09/2022 in [facility]  PHQ-2 Total Score 6 4 0 0 5  PHQ-9 Total Score 16 9 0 -- 15      Flowsheet Row Video Visit from 11/05/2021 in [facility] Pre-Admission Testing 60 from 09/29/2021 in [facility] Video Visit from 06/12/2021 in [facility]  C-SSRS  RISK CATEGORY No Risk No Risk Low Risk       Collaboration of Care: Collaboration of Care: Medication Management AEB ongoing medication management, Psychiatrist AEB established with this provider, and Referral or follow-up with counselor/therapist AEB established with individual therapist  Patient/Guardian was advised Release of Information must be obtained prior to any record release in order to collaborate their care with an outside provider. Patient/Guardian was advised if they have not already done so to contact the registration department to sign all necessary forms in order for [place] to release information regarding their care.   Consent: Patient/Guardian gives verbal consent for treatment and assignment of benefits for services provided during this visit. Patient/Guardian expressed understanding and agreed to proceed.   Televisit via video: I connected with patient on 06/15/23 at  3:30 PM EDT by a video enabled telemedicine application and verified that I am speaking with the correct person using two identifiers.  Location: Patient: home address in [place] Provider: remote office in [place]   I discussed the limitations of evaluation and management by telemedicine and the availability of in person appointments. The patient expressed understanding and agreed to proceed.  I discussed the assessment and treatment plan with the patient. The patient was provided an opportunity to ask questions and all were answered. The patient agreed with the plan and demonstrated an understanding of the instructions.   The patient was advised to call back or seek an in-person evaluation if the symptoms worsen or if the condition fails to improve as anticipated.  I provided 35 minutes dedicated to the care of this patient via video on the date of this encounter to include chart review, face-to-face time with the patient, medication management/counseling, brief supportive psychotherapy.  Evadene Wardrip A Jerre Diguglielmo 06/15/2023,  4:35 PM

## 2023-06-15 ENCOUNTER — Ambulatory Visit: Payer: Self-pay | Attending: Nurse Practitioner | Admitting: Nurse Practitioner

## 2023-06-15 ENCOUNTER — Other Ambulatory Visit: Payer: Self-pay

## 2023-06-15 ENCOUNTER — Encounter (HOSPITAL_COMMUNITY): Payer: Self-pay | Admitting: Psychiatry

## 2023-06-15 ENCOUNTER — Other Ambulatory Visit (HOSPITAL_COMMUNITY)
Admission: RE | Admit: 2023-06-15 | Discharge: 2023-06-15 | Disposition: A | Discharge status: Home / Self Care | Payer: Self-pay | Source: Ambulatory Visit | Attending: Nurse Practitioner | Admitting: Nurse Practitioner

## 2023-06-15 ENCOUNTER — Encounter: Payer: Self-pay | Admitting: Nurse Practitioner

## 2023-06-15 ENCOUNTER — Telehealth (HOSPITAL_COMMUNITY): Admitting: Psychiatry

## 2023-06-15 VITALS — BP 134/79 | HR 74 | Resp 19 | Ht 67.0 in | Wt 225.0 lb

## 2023-06-15 DIAGNOSIS — E119 Type 2 diabetes mellitus without complications: Secondary | ICD-10-CM

## 2023-06-15 DIAGNOSIS — E7800 Pure hypercholesterolemia, unspecified: Secondary | ICD-10-CM

## 2023-06-15 DIAGNOSIS — Z79899 Other long term (current) drug therapy: Secondary | ICD-10-CM

## 2023-06-15 DIAGNOSIS — F411 Generalized anxiety disorder: Secondary | ICD-10-CM | POA: Diagnosis not present

## 2023-06-15 DIAGNOSIS — F319 Bipolar disorder, unspecified: Secondary | ICD-10-CM

## 2023-06-15 DIAGNOSIS — N760 Acute vaginitis: Secondary | ICD-10-CM | POA: Insufficient documentation

## 2023-06-15 DIAGNOSIS — I10 Essential (primary) hypertension: Secondary | ICD-10-CM | POA: Diagnosis not present

## 2023-06-15 DIAGNOSIS — Z7984 Long term (current) use of oral hypoglycemic drugs: Secondary | ICD-10-CM | POA: Diagnosis not present

## 2023-06-15 DIAGNOSIS — R7303 Prediabetes: Secondary | ICD-10-CM | POA: Diagnosis not present

## 2023-06-15 MED ORDER — BENAZEPRIL HCL 20 MG PO TABS
20.0000 mg | ORAL_TABLET | Freq: Every day | ORAL | 1 refills | Status: DC
  Filled 2023-06-15 – 2023-07-12 (×2): qty 90, 90d supply, fill #0
  Filled 2023-10-09: qty 90, 90d supply, fill #1

## 2023-06-15 MED ORDER — GABAPENTIN 100 MG PO CAPS
100.0000 mg | ORAL_CAPSULE | Freq: Two times a day (BID) | ORAL | 1 refills | Status: DC
  Filled 2023-06-15: qty 180, 90d supply, fill #0

## 2023-06-15 MED ORDER — LITHIUM CARBONATE 300 MG PO CAPS
ORAL_CAPSULE | ORAL | 1 refills | Status: DC
  Filled 2023-06-15: qty 450, 90d supply, fill #0
  Filled 2023-06-16: qty 150, 30d supply, fill #0

## 2023-06-15 MED ORDER — SIMVASTATIN 40 MG PO TABS
40.0000 mg | ORAL_TABLET | Freq: Every day | ORAL | 1 refills | Status: DC
Start: 2023-06-15 — End: 2024-01-06
  Filled 2023-07-14: qty 90, 90d supply, fill #0
  Filled 2023-10-09: qty 90, 90d supply, fill #1

## 2023-06-15 MED ORDER — METFORMIN HCL 1000 MG PO TABS
1000.0000 mg | ORAL_TABLET | Freq: Two times a day (BID) | ORAL | 1 refills | Status: DC
  Filled 2023-06-15: qty 180, 90d supply, fill #0
  Filled 2023-07-04: qty 60, 30d supply, fill #0
  Filled 2023-07-12 – 2023-10-04 (×2): qty 180, 90d supply, fill #0
  Filled 2023-12-29: qty 180, 90d supply, fill #1

## 2023-06-15 MED ORDER — HYDROXYZINE PAMOATE 25 MG PO CAPS
25.0000 mg | ORAL_CAPSULE | Freq: Three times a day (TID) | ORAL | 0 refills | Status: DC | PRN
  Filled 2023-06-15: qty 15, 5d supply, fill #0

## 2023-06-15 NOTE — Progress Notes (Signed)
 Assessment & Plan:  Valerie Reilly was seen today for medical management of chronic issues.  Diagnoses and all orders for this visit:  Acute vaginitis -     Cervicovaginal ancillary only  Hypercholesterolemia -     simvastatin (ZOCOR) 40 MG tablet; Take 1 tablet (40 mg total) by mouth daily. INSTRUCTIONS: Work on a low fat, heart healthy diet and participate in regular aerobic exercise program by working out at least 150 minutes per week; 5 days a week-30 minutes per day. Avoid red meat/beef/steak,  fried foods. junk foods, sodas, sugary drinks, unhealthy snacking, alcohol and smoking.  Drink at least 80 oz of water per day and monitor your carbohydrate intake daily.    Type 2 diabetes mellitus without complication, with long-term current use of insulin (HCC) -     Hemoglobin A1c -     metFORMIN (GLUCOPHAGE) 1000 MG tablet; Take 1 tablet (1,000 mg total) by mouth 2 (two) times daily with a meal. Continue blood sugar control as discussed in office today, low carbohydrate diet, and regular physical exercise as tolerated, 150 minutes per week (30 min each day, 5 days per week, or 50 min 3 days per week). Keep blood sugar logs with fasting goal of 90-130 mg/dl, post prandial (after you eat) less than 180.  For Hypoglycemia: BS <60 and Hyperglycemia BS >400; contact the clinic ASAP. Annual eye exams and foot exams are recommended.   Essential hypertension -     benazepril (LOTENSIN) 20 MG tablet; Take 1 tablet (20 mg total) by mouth daily. Continue all antihypertensives as prescribed.  Reminded to bring in blood pressure log for follow  up appointment.  RECOMMENDATIONS: DASH/Mediterranean Diets are healthier choices for HTN.      Patient has been counseled on age-appropriate routine health concerns for screening and prevention. These are reviewed and up-to-date. Referrals have been placed accordingly. Immunizations are up-to-date or declined.    Subjective:   Chief Complaint  Patient presents  with   Medical Management of Chronic Issues    Valerie Reilly 48 y.o. female presents to office today for follow up to DM and HTN  She is requesting a vaginal swab today. She has frequent re occurrences of bacterial vaginitis. In a monogamous relationship.    DM2 A1c at goal. She is taking metformin 1000 mg BID as prescribed and glipizide 10 mg daily. .  Lab Results  Component Value Date   HGBA1C 5.9 (H) 12/15/2022    Lab Results  Component Value Date   HGBA1C 5.9 (H) 12/15/2022    HTN Blood pressure is well controlled. She is taking benazepril 20 mg daily BP Readings from Last 3 Encounters:  06/15/23 134/79  04/05/23 138/70  12/15/22 (!) 143/85    Review of Systems  Constitutional:  Negative for fever, malaise/fatigue and weight loss.  HENT: Negative.  Negative for nosebleeds.   Eyes: Negative.  Negative for blurred vision, double vision and photophobia.  Respiratory: Negative.  Negative for cough and shortness of breath.   Cardiovascular: Negative.  Negative for chest pain, palpitations and leg swelling.  Gastrointestinal: Negative.  Negative for heartburn, nausea and vomiting.  Musculoskeletal: Negative.  Negative for myalgias.  Neurological: Negative.  Negative for dizziness, focal weakness, seizures and headaches.  Psychiatric/Behavioral: Negative.  Negative for suicidal ideas.     Past Medical History:  Diagnosis Date   Allergy    Anxiety    Asthma    Bipolar disorder (HCC)    Diabetes mellitus  Diabetes mellitus, type II (HCC)    Gallstone    GERD (gastroesophageal reflux disease)    History of borderline personality disorder    Hypercholesteremia    Hypertension    Obesity    Psoriasis    Psoriasis    Seasonal allergies    Thyroid disease     Past Surgical History:  Procedure Laterality Date   CHOLECYSTECTOMY N/A 10/02/2021   Procedure: LAPAROSCOPIC CHOLECYSTECTOMY;  Surgeon: Lewie Chamber, DO;  Location: [facility];  Service: General;   Laterality: N/A;   TONSILLECTOMY     TUBAL LIGATION      Family History  Problem Relation Age of Onset   Personality disorder Mother    Diabetes Mother    Diabetes Father    Alcohol abuse Maternal Uncle    Depression Maternal Uncle    Diabetes Maternal Grandfather    Breast cancer Neg Hx    Stomach cancer Neg Hx    Colon cancer Neg Hx    Esophageal cancer Neg Hx    Colon polyps Neg Hx    Crohn's disease Neg Hx    Ulcerative colitis Neg Hx    Rectal cancer Neg Hx     Social History Reviewed with no changes to be made today.   Outpatient Medications Prior to Visit  Medication Sig Dispense Refill   albuterol (VENTOLIN HFA) 108 (90 Base) MCG/ACT inhaler Inhale 2 puffs into the lungs every 6 (six) hours as needed for wheezing or shortness of breath. 6.7 g 2   busPIRone (BUSPAR) 30 MG tablet Take 1 tablet (30 mg total) by mouth 2 (two) times daily. 180 tablet 1   cholecalciferol (VITAMIN D) 1000 UNITS tablet Take 1,000 Units by mouth daily.     famotidine (PEPCID) 20 MG tablet Take 1 tablet (20 mg total) by mouth daily. 90 tablet 1   fexofenadine (ALLEGRA) 180 MG tablet Take 1 tablet (180 mg total) by mouth daily. 90 tablet 1   fluticasone (FLONASE) 50 MCG/ACT nasal spray Place 2 sprays into both nostrils daily. 16 g 2   gabapentin (NEURONTIN) 100 MG capsule Take 1 capsule (100 mg total) by mouth at bedtime. 90 capsule 1   glipiZIDE (GLUCOTROL XL) 10 MG 24 hr tablet Take 1 tablet (10 mg total) by mouth daily with breakfast. 30 tablet 0   hydroxypropyl methylcellulose / hypromellose (ISOPTO TEARS / GONIOVISC) 2.5 % ophthalmic solution Place 1 drop into both eyes in the morning and at bedtime.     hydrOXYzine (VISTARIL) 25 MG capsule Take 1 capsule (25 mg total) by mouth every 8 (eight) hours as needed. 15 capsule 0   levothyroxine (SYNTHROID) 50 MCG tablet Take 1 tablet (50 mcg total) by mouth daily before breakfast. 90 tablet 1   lidocaine (LIDODERM) 5 % Place 1 patch onto the skin  daily. Remove & Discard patch within 12 hours daily, to lower back 30 patch 0   lithium carbonate 300 MG capsule Take 2 capsules (600 mg total) by mouth in the morning and at bedtime. 360 capsule 1   Multiple Vitamin (MULTIVITAMIN) capsule Take 1 capsule by mouth daily.     traZODone (DESYREL) 150 MG tablet Take 2 tablets (300 mg total) by mouth at bedtime. 180 tablet 1   benazepril (LOTENSIN) 20 MG tablet Take 1 tablet (20 mg total) by mouth daily. 90 tablet 1   metFORMIN (GLUCOPHAGE) 1000 MG tablet Take 1 tablet (1,000 mg total) by mouth 2 (two) times daily with a meal. 60  tablet 0   methocarbamol (ROBAXIN) 500 MG tablet Take 1 tablet (500 mg total) by mouth every 6 (six) hours as needed for muscle spasms. 60 tablet 1   simvastatin (ZOCOR) 40 MG tablet Take 1 tablet (40 mg total) by mouth daily. 30 tablet 0   guselkumab (TREMFYA) 100 MG/ML prefilled syringe Inject 1 syringe (100mg /40ml) into the skin every 8 weeks (Patient not taking: Reported on 06/15/2023) 1 mL 11   LORazepam (ATIVAN) 2 MG tablet PLEASE ARRIVE IN THE OFFICE 30 MINUTES PRIOR TO APPOINTMENT AND BRING THIS MEDICATION WITH YOU. DO NOT TAKE UNTIL YOU HAVE BEEN CHECKED IN. (Patient not taking: Reported on 04/05/2023) 2 tablet 0   amoxicillin (AMOXIL) 875 MG tablet Take 1 tablet (875 mg total) by mouth every 12 (twelve) hours. (Patient not taking: Reported on 06/15/2023) 10 tablet 0   chlorhexidine (PERIDEX) 0.12 % solution RINSE MOUTH WITH (1 CAPFUL) FOR 30 SECONDS AM AND PM AFTER TOOTHBRUSHING. EXPECTORATE AFTER RINSING, DO NOT SWALLOW (Patient not taking: Reported on 06/15/2023) 473 mL 0   HYDROcodone-acetaminophen (NORCO) 7.5-325 MG tablet TAKE 1 TABLET EVERY 6 HOURS AS NEEDED. (Patient not taking: Reported on 06/15/2023) 8 tablet 0   nicotine (NICODERM CQ - DOSED IN MG/24 HOURS) 21 mg/24hr patch Place 1 patch (21 mg total) onto the skin daily. (Patient not taking: Reported on 06/15/2023) 42 patch 0   Probiotic Product (PROBIOTIC BLEND PO)  Take 1 capsule by mouth daily. (Patient not taking: Reported on 04/05/2023)     No facility-administered medications prior to visit.    No Known Allergies     Objective:    BP 134/79 (BP Location: Left Arm, Patient Position: Sitting, Cuff Size: Normal)   Pulse 74   Resp 19   Ht 5\' 7"  (1.702 m)   Wt 225 lb (102.1 kg)   SpO2 100%   BMI 35.24 kg/m  Wt Readings from Last 3 Encounters:  06/15/23 225 lb (102.1 kg)  04/05/23 223 lb (101.2 kg)  12/15/22 224 lb 9.6 oz (101.9 kg)    Physical Exam Vitals and nursing note reviewed.  Constitutional:      Appearance: She is well-developed.  HENT:     Head: Normocephalic and atraumatic.  Cardiovascular:     Rate and Rhythm: Normal rate and regular rhythm.     Heart sounds: Normal heart sounds. No murmur heard.    No friction rub. No gallop.  Pulmonary:     Effort: Pulmonary effort is normal. No tachypnea or respiratory distress.     Breath sounds: Normal breath sounds. No decreased breath sounds, wheezing, rhonchi or rales.  Chest:     Chest wall: No tenderness.  Abdominal:     General: Bowel sounds are normal.     Palpations: Abdomen is soft.  Musculoskeletal:        General: Normal range of motion.     Cervical back: Normal range of motion.  Skin:    General: Skin is warm and dry.  Neurological:     Mental Status: She is alert and oriented to person, place, and time.     Coordination: Coordination normal.  Psychiatric:        Behavior: Behavior normal. Behavior is cooperative.        Thought Content: Thought content normal.        Judgment: Judgment normal.          Patient has been counseled extensively about nutrition and exercise as well as the importance of adherence with  medications and regular follow-up. The patient was given clear instructions to go to ER or return to medical center if symptoms don't improve, worsen or new problems develop. The patient verbalized understanding.   Follow-up: Return in about 6  months (around 12/15/2023).   Claiborne Rigg, FNP-BC [facility] and [facility] [place], [place] [phone]   06/15/2023, 9:24 AM

## 2023-06-16 ENCOUNTER — Other Ambulatory Visit: Payer: Self-pay

## 2023-06-16 ENCOUNTER — Ambulatory Visit: Payer: Self-pay

## 2023-06-16 ENCOUNTER — Ambulatory Visit: Payer: Self-pay | Admitting: Nurse Practitioner

## 2023-06-16 LAB — CERVICOVAGINAL ANCILLARY ONLY
Bacterial Vaginitis (gardnerella): POSITIVE — AB
Candida Glabrata: NEGATIVE
Candida Vaginitis: NEGATIVE
Chlamydia: NEGATIVE
Comment: NEGATIVE
Comment: NEGATIVE
Comment: NEGATIVE
Comment: NEGATIVE
Comment: NEGATIVE
Comment: NORMAL
Neisseria Gonorrhea: NEGATIVE
Trichomonas: NEGATIVE

## 2023-06-16 LAB — HEMOGLOBIN A1C
Est. average glucose Bld gHb Est-mCnc: 128 mg/dL
Hgb A1c MFr Bld: 6.1 % — ABNORMAL HIGH (ref 4.8–5.6)

## 2023-06-16 NOTE — Telephone Encounter (Signed)
 Copied from CRM [id number]. Topic: Clinical - Red Word Triage >> Jun 16, 2023  2:17 PM Patsy Lager T wrote: Red Word that prompted transfer to Nurse Triage: shortness of breath for he last 4hrs.   Chief Complaint: Dyspnea Symptoms: Dyspnea  Frequency: Last 4 Hours Pertinent Negatives: Patient denies fever, chest pain,  Disposition: [] ED /[] Urgent Care (no appt availability in office) / [] Appointment(In office/virtual)/ []  [place] [facility]/ [] Home Care/ [x] Refused Recommended Disposition /[] [place] [facility]/ []  Follow-up with PCP Additional Notes: KB is being triaged for dyspnea for the last four hours. The patient has a history of asthma and states she has not had this difficulty before. The patient states she has been using her inhaler and it has not been providing her with any relief at this time. Recommended the patient seek the nearest urgent care for prompt evaluation and treatment, patient refused due to her not being able to pay for services rendered. Offered to schedule an acute visit with another provider, patient refused due to not being able to pay for services rendered. The patient otherwise states she cannot go anywhere that requires her to pay as she cannot afford it.   Answer Assessment - Initial Assessment Questions 1. RESPIRATORY STATUS: "Describe your breathing?" (e.g., wheezing, shortness of breath, unable to speak, severe coughing)      Shortness of Breath at  Rest  2. ONSET: "When did this breathing problem begin?"      4 Hours  3. PATTERN "Does the difficult breathing come and go, or has it been constant since it started?"      Constant  4. SEVERITY: "How bad is your breathing?" (e.g., mild, moderate, severe)    - MILD: No SOB at rest, mild SOB with walking, speaks normally in sentences, can lie down, no retractions, pulse < 100.    - MODERATE: SOB at rest, SOB with minimal exertion and prefers to sit, cannot lie down flat, speaks in phrases, mild retractions,  audible wheezing, pulse 100-120.    - SEVERE: Very SOB at rest, speaks in single words, struggling to breathe, sitting hunched forward, retractions, pulse > 120      Moderate  5. RECURRENT SYMPTOM: "Have you had difficulty breathing before?" If Yes, ask: "When was the last time?" and "What happened that time?"      No  6. CARDIAC HISTORY: "Do you have any history of heart disease?" (e.g., heart attack, angina, bypass surgery, angioplasty)      No  7. LUNG HISTORY: "Do you have any history of lung disease?"  (e.g., pulmonary embolus, asthma, emphysema)     Asthma  8. CAUSE "What do you think is causing the breathing problem?"      Unsure  9. OTHER SYMPTOMS: "Do you have any other symptoms? (e.g., dizziness, runny nose, cough, chest pain, fever)     No other symptoms  10. O2 SATURATION MONITOR:  "Do you use an oxygen saturation monitor (pulse oximeter) at home?" If Yes, ask: "What is your reading (oxygen level) today?" "What is your usual oxygen saturation reading?" (e.g., 95%)       Unsure  11. PREGNANCY: "Is there any chance you are pregnant?" "When was your last menstrual period?"       No and Unsure  12. TRAVEL: "Have you traveled out of the country in the last month?" (e.g., travel history, exposures)       No  Protocols used: Breathing Difficulty-A-AH

## 2023-06-16 NOTE — Telephone Encounter (Signed)
 LVM for patient to return call.

## 2023-06-17 ENCOUNTER — Other Ambulatory Visit: Payer: Self-pay

## 2023-06-17 ENCOUNTER — Other Ambulatory Visit: Payer: Self-pay | Admitting: Family Medicine

## 2023-06-17 DIAGNOSIS — F17200 Nicotine dependence, unspecified, uncomplicated: Secondary | ICD-10-CM

## 2023-06-17 MED ORDER — ALBUTEROL SULFATE HFA 108 (90 BASE) MCG/ACT IN AERS
2.0000 | INHALATION_SPRAY | Freq: Four times a day (QID) | RESPIRATORY_TRACT | 2 refills | Status: AC | PRN
  Filled 2023-06-17: qty 6.7, 25d supply, fill #0

## 2023-06-19 ENCOUNTER — Encounter: Payer: Self-pay | Admitting: Nurse Practitioner

## 2023-06-19 ENCOUNTER — Other Ambulatory Visit: Payer: Self-pay | Admitting: Nurse Practitioner

## 2023-06-19 DIAGNOSIS — B9689 Other specified bacterial agents as the cause of diseases classified elsewhere: Secondary | ICD-10-CM

## 2023-06-19 MED ORDER — METRONIDAZOLE 500 MG PO TABS
500.0000 mg | ORAL_TABLET | Freq: Two times a day (BID) | ORAL | 0 refills | Status: AC
  Filled 2023-06-19: qty 14, 7d supply, fill #0

## 2023-06-20 ENCOUNTER — Other Ambulatory Visit: Payer: Self-pay

## 2023-06-22 ENCOUNTER — Other Ambulatory Visit: Payer: Self-pay

## 2023-06-22 ENCOUNTER — Other Ambulatory Visit: Payer: Self-pay | Admitting: Nurse Practitioner

## 2023-06-22 MED ORDER — FLUCONAZOLE 150 MG PO TABS
150.0000 mg | ORAL_TABLET | Freq: Once | ORAL | 0 refills | Status: AC
  Filled 2023-06-22: qty 1, 1d supply, fill #0

## 2023-06-23 ENCOUNTER — Other Ambulatory Visit: Payer: Self-pay

## 2023-06-24 ENCOUNTER — Ambulatory Visit (HOSPITAL_COMMUNITY): Payer: No Payment, Other | Admitting: Mental Health

## 2023-06-24 DIAGNOSIS — F411 Generalized anxiety disorder: Secondary | ICD-10-CM

## 2023-06-24 DIAGNOSIS — F603 Borderline personality disorder: Secondary | ICD-10-CM

## 2023-06-24 DIAGNOSIS — F319 Bipolar disorder, unspecified: Secondary | ICD-10-CM | POA: Diagnosis not present

## 2023-06-24 NOTE — Progress Notes (Unsigned)
 THERAPIST PROGRESS NOTE Virtual Visit via Video Note  I connected with Valerie Reilly on 06/24/23 at  9:00 AM EDT by a video enabled telemedicine application and verified that I am speaking with the correct person using two identifiers.  Location: Patient: home address on file Provider: home office   I discussed the limitations of evaluation and management by telemedicine and the availability of in person appointments. The patient expressed understanding and agreed to proceed.  I discussed the assessment and treatment plan with the patient. The patient was provided an opportunity to ask questions and all were answered. The patient agreed with the plan and demonstrated an understanding of the instructions.   The patient was advised to call back or seek an in-person evaluation if the symptoms worsen or if the condition fails to improve as anticipated.  I provided 54 minutes of non-face-to-face time during this encounter.   Valerie Reilly, [facility]   Session Time: 9:03 am ( 54 minutes)  Participation Level: Active  Behavioral Response: CasualAlertDysphoric/Irritable  Type of Therapy: Individual Therapy  Treatment Goals addressed: STG: "Deal better." Valerie Reilly will increase management of stress AEB ability to process events in balanced way with engagement in at least x 1 coping skills for stress as needed with the next 90 days.   ProgressTowards Goals: Progressing  Interventions: Supportive  Summary: Valerie Reilly is a 48 y.o. female who presents with dx of bipolar disorder, borderline personality disorder and generalized anxiety. Presents to session alert and oriented; mood and affect low, irritable. Shares for moods to depressed and shares to be tired and experiencing crying spells, " I am tired of dealing with this mental health shit." Notes ongoing concern for daughter and feels as if she is disrespectful. Shares to still not be working and has applied for disability.  Shares frustrating with interaction with PCP and shares for her to have been rude when she was attempting to inquire about disability. Shares has seen provider and lithium  medication was increased but denies to have felt effects from increase at this time. Shares feelings of frustration with hx of mental health concerns; denies elevated moods. Denies trigger for decrease in mood with ongoing previous stressors of daughter and finances. Shares for fiance to be supportive.Denies safety concerns. Notes frustration with additional follow up appointment to be far.   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 Completed check in and assessed for current level of functioning, sxs management and stressors. Provided safe space for Valerie Reilly to share thoughts and feelings in regard to current stressors and feelings of depression. Provided supportive feedback; validated feelings. Explored for ability to cope and presence fo safety concerns. Provided education on feelings of depression and bipolar disorder. Supported in processing feelings of frustration and depression. Encouraged ongoing medication management and tracking sxs. Reviewed session and provided follow up   Plan: Return again in  x 4 weeks.  Diagnosis: Bipolar I disorder (HCC)  GAD (generalized anxiety disorder)  Borderline personality disorder (HCC)  Collaboration of Care: Other None  Patient/Guardian was advised Release of Information must be obtained prior to any record release in order to collaborate their care with an outside provider. Patient/Guardian was advised if they have not already done so to contact the registration department to sign all necessary forms in order for us  to release information regarding their care.   Consent: Patient/Guardian gives verbal consent for treatment and assignment of benefits for services provided during this visit. Patient/Guardian expressed  understanding and agreed to proceed.   Carmel Chimes [place], [facility] 06/24/2023

## 2023-06-28 ENCOUNTER — Encounter: Payer: Self-pay | Admitting: Nurse Practitioner

## 2023-06-28 ENCOUNTER — Other Ambulatory Visit: Payer: Self-pay | Admitting: Obstetrics and Gynecology

## 2023-06-28 DIAGNOSIS — Z1231 Encounter for screening mammogram for malignant neoplasm of breast: Secondary | ICD-10-CM

## 2023-06-29 ENCOUNTER — Other Ambulatory Visit (INDEPENDENT_AMBULATORY_CARE_PROVIDER_SITE_OTHER)

## 2023-06-29 DIAGNOSIS — F319 Bipolar disorder, unspecified: Secondary | ICD-10-CM | POA: Diagnosis not present

## 2023-06-29 DIAGNOSIS — Z79899 Other long term (current) drug therapy: Secondary | ICD-10-CM | POA: Diagnosis not present

## 2023-06-29 NOTE — Progress Notes (Signed)
 Pt tolerated blood draw in left arm with no complaints.    JNL, CMA

## 2023-06-30 ENCOUNTER — Other Ambulatory Visit: Payer: Self-pay

## 2023-06-30 ENCOUNTER — Encounter (HOSPITAL_COMMUNITY): Payer: Self-pay

## 2023-06-30 LAB — LITHIUM LEVEL: Lithium Lvl: 0.8 mmol/L (ref 0.5–1.2)

## 2023-07-04 ENCOUNTER — Other Ambulatory Visit: Payer: Self-pay

## 2023-07-07 ENCOUNTER — Telehealth: Payer: Self-pay | Admitting: Nurse Practitioner

## 2023-07-07 NOTE — Telephone Encounter (Signed)
 Patient requested to have a copy confirmation of what was faxed. Please Advise.   Copied from CRM [id number]. Topic: General - Other >> Jul 07, 2023  9:27 AM Valerie Reilly wrote: Reason for CRM: Patient called checking on paperwork for donation of plasma to be filled out.. pls call her number on file good.. would like a copy and did leave fax for the place  >> Jul 07, 2023  3:07 PM Valerie Reilly wrote: Patient calling back in as she had not heard back from the office. I was able to see a note was put in shortly after her call that stated her forms were completed and faxed. She requested to have a copy of what was a faxed for her picked up at the front desk. CAL confirmed there is nothing ready for her to pick up. Patient would like a call back tomorrow to know when she can pick up copies of the forms.

## 2023-07-07 NOTE — Telephone Encounter (Signed)
 Forms have been signed and faxed.

## 2023-07-07 NOTE — Telephone Encounter (Signed)
 Copied from CRM [id number]. Topic: General - Other  >> Jul 07, 2023  9:27 AM Emylou G wrote: Reason for CRM: Patient called checking on paperwork for donation of plasma to be filled out.. pls call her number on file good.. would like a copy and did leave fax for the place

## 2023-07-08 ENCOUNTER — Ambulatory Visit (INDEPENDENT_AMBULATORY_CARE_PROVIDER_SITE_OTHER): Admitting: Mental Health

## 2023-07-08 DIAGNOSIS — F411 Generalized anxiety disorder: Secondary | ICD-10-CM

## 2023-07-08 DIAGNOSIS — F319 Bipolar disorder, unspecified: Secondary | ICD-10-CM | POA: Diagnosis not present

## 2023-07-08 NOTE — Telephone Encounter (Signed)
 Patient identified by name and date of birth.  Patient has been informed of forms being ready for pick up at our front desk.

## 2023-07-08 NOTE — Progress Notes (Unsigned)
   THERAPIST PROGRESS NOTE Virtual Visit via Video Note  I connected with Branda S Rayburn on 07/08/23 at  9:00 AM EDT by a video enabled telemedicine application and verified that I am speaking with the correct person using two identifiers.  Location: Patient: home address on file Provider: home office   I discussed the limitations of evaluation and management by telemedicine and the availability of in person appointments. The patient expressed understanding and agreed to proceed.    I discussed the assessment and treatment plan with the patient. The patient was provided an opportunity to ask questions and all were answered. The patient agreed with the plan and demonstrated an understanding of the instructions.   The patient was advised to call back or seek an in-person evaluation if the symptoms worsen or if the condition fails to improve as anticipated.  I provided 43 minutes of non-face-to-face time during this encounter.   Loman Risk, [facility]   Session Time: 9:05 am   Participation Level: Active  Behavioral Response: CasualAlertDysphoric  Type of Therapy: Individual Therapy  Treatment Goals addressed: STG: "Deal better." Cashe will increase management of stress AEB ability to process events in balanced way with engagement in at least x 1 coping skills for stress as needed with the next 90 days.     ProgressTowards Goals: Progressing  Interventions: Supportive  Summary: Fontella S Hopwood is a 48 y.o. female who presents with dx of bipolar disorder, borderline personality disorder and generalized anxiety. Presents to session alert and oriented; mood and affect low. Shares for moods to depressed and notes stressors related to living with daughter. Notes was able to be approved for food stamps as this will help with current financial stressors and has been also presenting to food banks. Shares thoughts on relationship with fiance. Explores with therapist working  to  engage in coping skills to support in working through low mood and identifies going for walks and increased community engagement. Shares concerning hx of mental health concerns and application for disability. Denies safety concerns.   Suicidal/Homicidal: Nowithout intent/plan  Therapist Response:  Therapist engaged [employer] in [employer]. Completed check in and assessed for current level of functioning, sxs management and stressors. Provided safe space for [place] to share thoughts and feelings in regard to current stressors and feelings of depression. Provided supportive feedback; validated feelings. Explored for ability to cope and encouraged behavioral activation and discouraged remaining in her room all day. Assessed for medication compliance. Reviewed session and provided follow up.  Plan: Return again in x 4 weeks.  Diagnosis: Bipolar I disorder (HCC)  GAD (generalized anxiety disorder)  Collaboration of Care: Other None  Patient/Guardian was advised Release of Information must be obtained prior to any record release in order to collaborate their care with an outside provider. Patient/Guardian was advised if they have not already done so to contact the registration department to sign all necessary forms in order for us  to release information regarding their care.   Consent: Patient/Guardian gives verbal consent for treatment and assignment of benefits for services provided during this visit. Patient/Guardian expressed understanding and agreed to proceed.   Carmel Chimes [place], [facility] 07/08/2023

## 2023-07-12 ENCOUNTER — Other Ambulatory Visit: Payer: Self-pay

## 2023-07-12 ENCOUNTER — Other Ambulatory Visit: Payer: Self-pay | Admitting: Nurse Practitioner

## 2023-07-12 DIAGNOSIS — E119 Type 2 diabetes mellitus without complications: Secondary | ICD-10-CM

## 2023-07-12 MED ORDER — GLIPIZIDE ER 10 MG PO TB24
10.0000 mg | ORAL_TABLET | Freq: Every day | ORAL | 0 refills | Status: DC
Start: 2023-07-12 — End: 2023-07-14
  Filled 2023-07-12 (×2): qty 30, 30d supply, fill #0

## 2023-07-12 NOTE — Progress Notes (Unsigned)
 BH MD Outpatient Progress Note  07/13/2023 2:09 PM Valerie Reilly  MRN:  [id number]  Assessment:  Valerie Reilly presents for follow-up evaluation. Today, 07/13/23, patient endorses persistent depressive symptoms despite increase in lithium . She is tolerating higher dose of lithium  well although without notable change in mood. Denies SI however continues to experience behavioral inactivation, low mood, and irritability most days. She is amenable to addition of Latuda to target current depressive symptoms. She continues to report issues with short-term memory; will plan for in-person visit in next several months to more thoroughly assess and monitor how cognition responds to treatment of depression. We have also discussed long term goal to reduce sedating medications over time.   RTC in 2 months by video.  Identifying Information: Valerie Reilly is a 48 y.o. female with a history of bipolar 1 disorder, borderline personality disorder, GAD, T2DM, hypothyroidism on Synthroid , HTN, and HLD who is an established patient with [facility] participating in follow-up via video conferencing. On further review of historical bipolar diagnosis, this diagnosis is felt to be accurate as patient reports history of discrete manic episodes requiring hospitalization during periods of non-adherence to Lithium .   Plan:  # Bipolar 1 disorder currently depressed  GAD # Borderline personality disorder Past medication trials: Effexor, Depakote (brief) Status of problem: recent exacerbation Interventions: -- Continue lithium  600 mg qAM + 900 mg at bedtime (i4/9/25) (patient prefers 300 mg capsules as currently rx) -- START Latuda 20 mg qEvening with supper (s5/7/25) -- Risks, benefits, and side effects including but not limited to sedation, dizziness, metabolic effects, and EPS/tardive dyskinesia were reviewed with informed consent provided -- Continue buspirone  30 mg 2 times daily --  Continue gabapentin  100 mg BID -- Continue Atarax  12.5-25 mg BID PRN anxiety (using infrequently) -- Continue individual psychotherapy with Carm[facility]  # Subjective memory deficits Status of problem: acute Interventions: -- Plan for in person assessment in August to administer MOCA -- Plan to taper medications that may be contributing to cognitive dulling as tolerated (trazodone , gabapentin )  # Sleep regulation Past medication trials: unknown Status of problem: stable Interventions: -- Continue trazodone  300 mg at bedtime   # High risk medication use Interventions: -- Lithium :  -- Kidney function within normal limits 04/13/23  -- TSH within normal limits 04/13/23  -- Lithium  level 0.8 06/29/23  Patient was given contact information for behavioral health clinic and was instructed to call 911 for emergencies.   Subjective:  Chief Complaint:  Chief Complaint  Patient presents with   Medication Management    Interval History:  Reports she tolerated increase in lithium  well although not noticing much of a difference. Continues to feel depressed most days, more anxious, low energy and motivation. Requires encouragement from boyfriend to get out of the house. Sleeping well with use of trazodone . Appetite is stable. Finds Atarax  helpful although makes her sleepy; has not tried half tablet and open to trying this. Reports ongoing issues with memory since entering menopause. Denies passive/active SI.   On review of bipolar history - reports history of excessively elevated mood when she stopped taking her lithium  years ago (about in 2013). During that time, felt on top of the world with excessive cleaning, excessive energy, impulsively selling items, and other impulsivity leading her to be sent to jail. Longest period was about several weeks.   Given persistence of depressive symptoms, amenable to addition of new medication. Opted to trial Latuda and risks/benefits reviewed.   Visit  Diagnosis:    ICD-10-CM   1. Bipolar I disorder (HCC)  F31.9 traZODone  (DESYREL ) 150 MG tablet    lithium  carbonate 300 MG capsule    2. GAD (generalized anxiety disorder)  F41.1 busPIRone  (BUSPAR ) 30 MG tablet    gabapentin  (NEURONTIN ) 100 MG capsule    3. Insomnia due to other mental disorder  F51.05 traZODone  (DESYREL ) 150 MG tablet   F99     4. Borderline personality disorder (HCC)  F60.3       Past Psychiatric History:  Diagnoses: Bipolar 1 disorder, borderline personality disorder, cannabis use disorder in remission Medication trials: Effexor, Depakote (brief) Hospitalizations: multiple - last hospitalization at [facility] in 2014 for episode of mania Suicide attempt: yes - many years ago; hx of 2-3 previous attempts  Substance use:   -- Etoh: denies  -- Denies use of cannabis or other illicit drugs  -- Tobacco: 2 ppd; precontemplative regarding cessation  Past Medical History:  Past Medical History:  Diagnosis Date   Allergy    Anxiety    Asthma    Bipolar disorder (HCC)    Diabetes mellitus    Diabetes mellitus, type II (HCC)    Gallstone    GERD (gastroesophageal reflux disease)    History of borderline personality disorder    Hypercholesteremia    Hypertension    Obesity    Psoriasis    Psoriasis    Seasonal allergies    Thyroid  disease     Past Surgical History:  Procedure Laterality Date   CHOLECYSTECTOMY N/A 10/02/2021   Procedure: LAPAROSCOPIC CHOLECYSTECTOMY;  Surgeon: Marijo Shove, DO;  Location: [facility];  Service: General;  Laterality: N/A;   TONSILLECTOMY     TUBAL LIGATION      Family Psychiatric History:  Maternal uncle: depression, alcohol use Mother: personality disorder  Family History:  Family History  Problem Relation Age of Onset   Personality disorder Mother    Diabetes Mother    Diabetes Father    Alcohol abuse Maternal Uncle    Depression Maternal Uncle    Diabetes Maternal Grandfather    Breast cancer Neg Hx     Stomach cancer Neg Hx    Colon cancer Neg Hx    Esophageal cancer Neg Hx    Colon polyps Neg Hx    Crohn's disease Neg Hx    Ulcerative colitis Neg Hx    Rectal cancer Neg Hx     Social History:  Social History   Socioeconomic History   Marital status: Divorced    Spouse name: Not on file   Number of children: 1   Years of education: Not on file   Highest education level: Associate degree: occupational, Scientist, product/process development, or vocational program  Occupational History   Occupation: delivery newspaper  Tobacco Use   Smoking status: Every Day    Current packs/day: 2.00    Average packs/day: 2.0 packs/day for 30.0 years (60.0 ttl pk-yrs)    Types: Cigarettes    Passive exposure: Current   Smokeless tobacco: Never  Vaping Use   Vaping status: Never Used  Substance and Sexual Activity   Alcohol use: Yes    Comment: occasional- a few times a year   Drug use: No    Types: Marijuana    Comment: last time was 3 yrs ago   Sexual activity: Yes    Birth control/protection: Surgical    Comment: Tubal ligation  Other Topics Concern   Not on file  Social History Narrative  Not on file   Social Drivers of Health   Financial Resource Strain: Low Risk  (12/15/2022)   Overall Financial Resource Strain (CARDIA)    Difficulty of Paying Living Expenses: Not hard at all  Food Insecurity: No Food Insecurity (12/15/2022)   Hunger Vital Sign    Worried About Running Out of Food in the Last Year: Never true    Ran Out of Food in the Last Year: Never true  Transportation Needs: No Transportation Needs (12/15/2022)   PRAPARE - Administrator, Civil Service (Medical): No    Lack of Transportation (Non-Medical): No  Physical Activity: Inactive (12/15/2022)   Exercise Vital Sign    Days of Exercise per Week: 0 days    Minutes of Exercise per Session: 0 min  Stress: No Stress Concern Present (12/15/2022)   [employer] of Occupational Health - Occupational Stress Questionnaire     Feeling of Stress : Not at all  Social Connections: Socially Isolated (12/15/2022)   Social Connection and Isolation Panel [NHANES]    Frequency of Communication with Friends and Family: More than three times a week    Frequency of Social Gatherings with Friends and Family: Once a week    Attends Religious Services: Never    Database administrator or Organizations: No    Attends Engineer, structural: Never    Marital Status: Divorced    Allergies: No Known Allergies  Current Medications: Current Outpatient Medications  Medication Sig Dispense Refill   hydrOXYzine  (ATARAX ) 25 MG tablet Take 0.5-1 tablets (12.5-25 mg total) by mouth 2 (two) times daily as needed for anxiety. 90 tablet 1   lurasidone (LATUDA) 20 MG TABS tablet Take 1 tablet (20 mg total) by mouth daily with supper. 30 tablet 2   albuterol  (VENTOLIN  HFA) 108 (90 Base) MCG/ACT inhaler Inhale 2 puffs into the lungs every 6 (six) hours as needed for wheezing or shortness of breath. 6.7 g 2   benazepril  (LOTENSIN ) 20 MG tablet Take 1 tablet (20 mg total) by mouth daily. 90 tablet 1   busPIRone  (BUSPAR ) 30 MG tablet Take 1 tablet (30 mg total) by mouth 2 (two) times daily. 180 tablet 1   cholecalciferol (VITAMIN D ) 1000 UNITS tablet Take 1,000 Units by mouth daily.     famotidine  (PEPCID ) 20 MG tablet Take 1 tablet (20 mg total) by mouth daily. 90 tablet 1   fexofenadine  (ALLEGRA ) 180 MG tablet Take 1 tablet (180 mg total) by mouth daily. 90 tablet 1   fluticasone  (FLONASE ) 50 MCG/ACT nasal spray Place 2 sprays into both nostrils daily. 16 g 2   gabapentin  (NEURONTIN ) 100 MG capsule Take 1 capsule (100 mg total) by mouth 2 (two) times daily. 180 capsule 1   glipiZIDE  (GLUCOTROL  XL) 10 MG 24 hr tablet Take 1 tablet (10 mg total) by mouth daily with breakfast. 30 tablet 0   guselkumab  (TREMFYA ) 100 MG/ML prefilled syringe Inject 1 syringe (100mg /79ml) into the skin every 8 weeks (Patient not taking: Reported on 06/15/2023) 1 mL  11   hydroxypropyl methylcellulose / hypromellose (ISOPTO TEARS / GONIOVISC) 2.5 % ophthalmic solution Place 1 drop into both eyes in the morning and at bedtime.     levothyroxine  (SYNTHROID ) 50 MCG tablet Take 1 tablet (50 mcg total) by mouth daily before breakfast. 90 tablet 1   lidocaine  (LIDODERM ) 5 % Place 1 patch onto the skin daily. Remove & Discard patch within 12 hours daily, to lower back 30 patch 0  lithium  carbonate 300 MG capsule Take 2 capsules (600 mg total) by mouth in the morning AND 3 capsules (900 mg total) at bedtime. 450 capsule 1   LORazepam  (ATIVAN ) 2 MG tablet PLEASE ARRIVE IN THE OFFICE 30 MINUTES PRIOR TO APPOINTMENT AND BRING THIS MEDICATION WITH YOU. DO NOT TAKE UNTIL YOU HAVE BEEN CHECKED IN. (Patient not taking: Reported on 04/05/2023) 2 tablet 0   metFORMIN  (GLUCOPHAGE ) 1000 MG tablet Take 1 tablet (1,000 mg total) by mouth 2 (two) times daily with a meal. 180 tablet 1   Multiple Vitamin (MULTIVITAMIN) capsule Take 1 capsule by mouth daily.     simvastatin  (ZOCOR ) 40 MG tablet Take 1 tablet (40 mg total) by mouth daily. 90 tablet 1   traZODone  (DESYREL ) 150 MG tablet Take 2 tablets (300 mg total) by mouth at bedtime. 180 tablet 1   No current facility-administered medications for this visit.    ROS: Denies any physical complaints  Objective:  Psychiatric Specialty Exam: There were no vitals taken for this visit.There is no height or weight on file to calculate BMI.  General Appearance: Casual and Fairly Groomed  Eye Contact:  Good  Speech:  Clear and Coherent and Normal Rate  Volume:  Normal  Mood:   "still depressed"  Affect:   Euthymic; calm  Thought Content:  Denies AVH; IOR; paranoia    Suicidal Thoughts:   Denies passive/active SI  Homicidal Thoughts:  No  Thought Process:  Goal Directed and Linear  Orientation:  Full (Time, Place, and Person)    Memory:   Grossly oriented  Judgment:  Good  Insight:  Fair  Concentration:  Concentration: Good   Recall:  NA  Fund of Knowledge: Good  Language: Good  Psychomotor Activity:  Normal  Akathisia:  NA  AIMS (if indicated): not done  Assets:  Communication Skills Desire for Improvement Housing Intimacy Leisure Time Social Support Vocational/Educational  ADL's:  Intact  Cognition: WNL  Sleep:  Good   PE: General: sits comfortably in view of camera; no acute distress  Pulm: no increased work of breathing on room air  MSK: all extremity movements appear intact  Neuro: no focal neurological deficits observed  Gait & Station: unable to assess by video    Metabolic Disorder Labs: Lab Results  Component Value Date   HGBA1C 6.1 (H) 06/15/2023   No results found for: "PROLACTIN" Lab Results  Component Value Date   CHOL 135 12/15/2022   TRIG 109 12/15/2022   HDL 41 12/15/2022   CHOLHDL 3.3 12/15/2022   LDLCALC 74 12/15/2022   LDLCALC 54 06/10/2022   Lab Results  Component Value Date   TSH 1.630 04/13/2023   TSH 1.200 12/15/2022    Therapeutic Level Labs: Lab Results  Component Value Date   LITHIUM  0.8 06/29/2023   LITHIUM  0.6 04/13/2023   No results found for: "VALPROATE" No results found for: "CBMZ"  Screenings:  AUDIT    Flowsheet Row Office Visit from 06/10/2022 in [facility] Of [place]. [facility]  Alcohol Use Disorder Identification Test Final Score (AUDIT) 5      GAD-7    Flowsheet Row Office Visit from 06/15/2023 in [facility] [place] - A Dept Of [place]. [facility] Office Visit from 04/05/2023 in [facility] Office Visit from 12/15/2022 in [facility] [place] - A Dept Of [place]. [facility] Office Visit from 10/15/2022 in  [facility] [place] - [facility] Dept Of [place]. [facility] Counselor from 08/09/2022 in [facility]  Total GAD-7 Score 13 11 0 0 13      PHQ2-9    Flowsheet Row Office Visit  from 06/15/2023 in [facility] [place] - A Dept Of [place]. [facility] Office Visit from 04/05/2023 in [facility] Office Visit from 12/15/2022 in [facility] [place] - A Dept Of [place]. [facility] Office Visit from 10/15/2022 in [facility] [place] - A Dept Of [place]. [facility] Counselor from 08/09/2022 in [facility]  PHQ-2 Total Score 6 4 0 0 5  PHQ-9 Total Score 16 9 0 -- 15      Flowsheet Row Video Visit from 11/05/2021 in [facility] Pre-Admission Testing 60 from 09/29/2021 in [facility] Video Visit from 06/12/2021 in [facility]  C-SSRS RISK CATEGORY No Risk No Risk Low Risk       Collaboration of Care: Collaboration of Care: Medication Management AEB ongoing medication management, Psychiatrist AEB established with this provider, and Referral or follow-up with counselor/therapist AEB established with individual therapist  Patient/Guardian was advised Release of Information must be obtained prior to any record release in order to collaborate their care with an outside provider. Patient/Guardian was advised if they have not already done so to contact the registration department to sign all necessary forms in order for us  to release information regarding their care.   Consent: Patient/Guardian gives verbal consent for treatment and assignment of benefits for services provided during this visit. Patient/Guardian expressed understanding and agreed to proceed.   Televisit via video: I connected with patient on 07/13/23 at 10:00 AM EDT by a video enabled telemedicine application and verified that I am speaking with the correct person using two identifiers.  Location: Patient: home address in [place] Provider: remote office in [place]   I discussed the limitations of evaluation and management by  telemedicine and the availability of in person appointments. The patient expressed understanding and agreed to proceed.  I discussed the assessment and treatment plan with the patient. The patient was provided an opportunity to ask questions and all were answered. The patient agreed with the plan and demonstrated an understanding of the instructions.   The patient was advised to call back or seek an in-person evaluation if the symptoms worsen or if the condition fails to improve as anticipated.  I provided 30 minutes dedicated to the care of this patient via video on the date of this encounter to include chart review, face-to-face time with the patient, medication management/counseling, brief supportive psychotherapy.  Lakshmi Sundeen A Saavi Mceachron 07/13/2023, 2:09 PM

## 2023-07-13 ENCOUNTER — Telehealth (HOSPITAL_COMMUNITY): Admitting: Psychiatry

## 2023-07-13 ENCOUNTER — Encounter (HOSPITAL_COMMUNITY): Payer: Self-pay | Admitting: Psychiatry

## 2023-07-13 ENCOUNTER — Other Ambulatory Visit: Payer: Self-pay

## 2023-07-13 ENCOUNTER — Telehealth (HOSPITAL_COMMUNITY): Payer: Self-pay | Admitting: *Deleted

## 2023-07-13 ENCOUNTER — Other Ambulatory Visit: Payer: Self-pay | Admitting: Nurse Practitioner

## 2023-07-13 DIAGNOSIS — F603 Borderline personality disorder: Secondary | ICD-10-CM

## 2023-07-13 DIAGNOSIS — F319 Bipolar disorder, unspecified: Secondary | ICD-10-CM

## 2023-07-13 DIAGNOSIS — F411 Generalized anxiety disorder: Secondary | ICD-10-CM | POA: Diagnosis not present

## 2023-07-13 DIAGNOSIS — F5105 Insomnia due to other mental disorder: Secondary | ICD-10-CM | POA: Diagnosis not present

## 2023-07-13 DIAGNOSIS — E119 Type 2 diabetes mellitus without complications: Secondary | ICD-10-CM

## 2023-07-13 DIAGNOSIS — F99 Mental disorder, not otherwise specified: Secondary | ICD-10-CM | POA: Diagnosis not present

## 2023-07-13 MED ORDER — HYDROXYZINE HCL 25 MG PO TABS
12.5000 mg | ORAL_TABLET | Freq: Two times a day (BID) | ORAL | 1 refills | Status: DC | PRN
  Filled 2023-07-13: qty 90, 45d supply, fill #0
  Filled 2023-07-13: qty 60, 30d supply, fill #0

## 2023-07-13 MED ORDER — LURASIDONE HCL 20 MG PO TABS
20.0000 mg | ORAL_TABLET | Freq: Every day | ORAL | 2 refills | Status: DC
  Filled 2023-07-13: qty 30, 30d supply, fill #0
  Filled 2023-08-08: qty 30, 30d supply, fill #1

## 2023-07-13 MED ORDER — TRAZODONE HCL 150 MG PO TABS
300.0000 mg | ORAL_TABLET | Freq: Every day | ORAL | 1 refills | Status: DC
  Filled 2023-07-13: qty 60, 30d supply, fill #0
  Filled 2023-07-13 – 2023-08-22 (×3): qty 180, 90d supply, fill #0

## 2023-07-13 MED ORDER — GABAPENTIN 100 MG PO CAPS
100.0000 mg | ORAL_CAPSULE | Freq: Two times a day (BID) | ORAL | 1 refills | Status: DC
  Filled 2023-07-13 – 2023-08-10 (×3): qty 180, 90d supply, fill #0

## 2023-07-13 MED ORDER — LITHIUM CARBONATE 300 MG PO CAPS
ORAL_CAPSULE | ORAL | 1 refills | Status: DC
  Filled 2023-07-13: qty 150, 30d supply, fill #0
  Filled 2023-07-13 – 2023-08-08 (×2): qty 450, 90d supply, fill #0

## 2023-07-13 MED ORDER — BUSPIRONE HCL 30 MG PO TABS
30.0000 mg | ORAL_TABLET | Freq: Two times a day (BID) | ORAL | 1 refills | Status: DC
  Filled 2023-07-13: qty 180, 90d supply, fill #0
  Filled 2023-08-31: qty 60, 30d supply, fill #0
  Filled 2023-10-04: qty 60, 30d supply, fill #1

## 2023-07-13 NOTE — Telephone Encounter (Signed)
 Fax received for PA of Lithium  Carbonate 300mg  capsules. Submitted online with cover my meds. Awaiting decision.

## 2023-07-13 NOTE — Patient Instructions (Signed)
 Thank you for attending your appointment today.  -- START Latuda 20 mg each evening with supper -- Continue other medications as prescribed.  Please do not make any changes to medications without first discussing with your provider. If you are experiencing a psychiatric emergency, please call 911 or present to your nearest emergency department. Additional crisis, medication management, and therapy resources are included below.  [facility]  [address] [place], [place] [address] [phone] WALK-IN URGENT CARE 24/7 FOR ANYONE [address] [place], [place]  [phone] Fax: [phone] [email or web address] *Interpreters available *Accepts all insurance and uninsured for Urgent Care needs *Accepts Medicaid and uninsured for outpatient treatment (below)      ONLY FOR [facility]  Below:    Outpatient New Patient Assessment/Therapy Walk-ins:        Monday, Wednesday, and Thursday 8am until slots are full (first come, first served)                   New Patient Psychiatry/Medication Management        Monday-Friday 8am-11am (first come, first served)               For all walk-ins we ask that you arrive by 7:15am, because patients will be seen in the order of arrival.

## 2023-07-14 ENCOUNTER — Telehealth: Payer: Self-pay | Admitting: Nurse Practitioner

## 2023-07-14 ENCOUNTER — Ambulatory Visit: Payer: Self-pay

## 2023-07-14 ENCOUNTER — Other Ambulatory Visit: Payer: Self-pay | Admitting: Nurse Practitioner

## 2023-07-14 ENCOUNTER — Other Ambulatory Visit: Payer: Self-pay

## 2023-07-14 DIAGNOSIS — E119 Type 2 diabetes mellitus without complications: Secondary | ICD-10-CM

## 2023-07-14 MED ORDER — GLIPIZIDE ER 10 MG PO TB24
10.0000 mg | ORAL_TABLET | Freq: Every day | ORAL | 1 refills | Status: DC
  Filled 2023-07-15: qty 90, 90d supply, fill #0
  Filled 2023-10-09: qty 90, 90d supply, fill #1

## 2023-07-14 NOTE — Telephone Encounter (Signed)
 Copied from CRM [id number]. Topic: Clinical - Medication Refill >> Jul 14, 2023 11:09 AM Tiffany S wrote: Medication: glipiZIDE  (GLUCOTROL  XL) 10 MG 24 hr tablet [[id number]] Patient need 90 days supply   Has the patient contacted their pharmacy? Yes (Agent: If no, request that the patient contact the pharmacy for the refill. If patient does not wish to contact the pharmacy document the reason why and proceed with request.) (Agent: If yes, when and what did the pharmacy advise?)  This is the patient's preferred pharmacy:  [facility] [address] [place] [address] Phone: [phone] Fax: [phone]  Is this the correct pharmacy for this prescription? Yes If no, delete pharmacy and type the correct one.   Has the prescription been filled recently? Yes  Is the patient out of the medication? Yes  Has the patient been seen for an appointment in the last year OR does the patient have an upcoming appointment? Yes  Can we respond through MyChart? Yes  Agent: Please be advised that Rx refills may take up to 3 business days. We ask that you follow-up with your pharmacy.

## 2023-07-14 NOTE — Telephone Encounter (Signed)
 Please let her know we already submitted paperwork for the tremfya  to be prescribed. Would she like for us  to cancel the paperwork??

## 2023-07-14 NOTE — Telephone Encounter (Addendum)
 Copied from CRM [id number]. Topic: General - Call Back - No Documentation >> Jul 14, 2023 11:08 AM Tiffany S wrote: Reason for CRM: Patient has questions regarding medications please follow up with patient  1st attempt made; call cannot be completed as dialed.   Wants a 90 day supply of Glipizide , 30 days was called in and she needs 90.  Also wants to give plasma for extra money; states they denied her due to tremfya  which she No longer takes.

## 2023-07-14 NOTE — Telephone Encounter (Signed)
 Will correct paperwork.

## 2023-07-14 NOTE — Telephone Encounter (Signed)
 Pt came in this afternoon to pick up paperwork she said question 2 is incorrect she doesn't take the tremfya   since nov 6 last year.

## 2023-07-15 ENCOUNTER — Other Ambulatory Visit: Payer: Self-pay

## 2023-07-18 ENCOUNTER — Telehealth (HOSPITAL_COMMUNITY): Admitting: Psychiatry

## 2023-07-18 NOTE — Telephone Encounter (Signed)
 Paper work has been corrected and patient is aware.

## 2023-07-18 NOTE — Telephone Encounter (Signed)
 Patient identified by name and date of birth.  Patient aware of provider addition to form and form is waiting for pick up at the front desk.

## 2023-07-19 ENCOUNTER — Telehealth: Payer: Self-pay | Admitting: Nurse Practitioner

## 2023-07-19 NOTE — Telephone Encounter (Signed)
 Pt requesting referrals to dermatologist  and  obstetrician/gynecologist sch appt 5/27 at 4:10 video visit. She said if you need her to come in she can.

## 2023-07-20 NOTE — Telephone Encounter (Signed)
 Noted.

## 2023-07-25 ENCOUNTER — Other Ambulatory Visit: Payer: Self-pay

## 2023-07-26 ENCOUNTER — Other Ambulatory Visit: Payer: Self-pay

## 2023-07-26 ENCOUNTER — Other Ambulatory Visit: Payer: Self-pay | Admitting: Nurse Practitioner

## 2023-07-26 ENCOUNTER — Other Ambulatory Visit: Payer: Self-pay | Admitting: Family Medicine

## 2023-07-26 DIAGNOSIS — J3089 Other allergic rhinitis: Secondary | ICD-10-CM

## 2023-07-26 DIAGNOSIS — E039 Hypothyroidism, unspecified: Secondary | ICD-10-CM

## 2023-07-26 MED ORDER — FLUTICASONE PROPIONATE 50 MCG/ACT NA SUSP
2.0000 | Freq: Every day | NASAL | 2 refills | Status: DC
  Filled 2023-07-26: qty 16, 30d supply, fill #0

## 2023-07-26 MED ORDER — LEVOTHYROXINE SODIUM 50 MCG PO TABS
50.0000 ug | ORAL_TABLET | Freq: Every day | ORAL | 1 refills | Status: DC
  Filled 2023-07-26: qty 30, 30d supply, fill #0
  Filled 2023-07-27: qty 90, 90d supply, fill #0
  Filled 2023-10-22: qty 90, 90d supply, fill #1

## 2023-07-27 ENCOUNTER — Other Ambulatory Visit: Payer: Self-pay

## 2023-07-27 ENCOUNTER — Other Ambulatory Visit: Payer: Self-pay | Admitting: Nurse Practitioner

## 2023-07-27 ENCOUNTER — Other Ambulatory Visit (HOSPITAL_COMMUNITY): Payer: Self-pay

## 2023-07-27 DIAGNOSIS — J3089 Other allergic rhinitis: Secondary | ICD-10-CM

## 2023-07-27 NOTE — Telephone Encounter (Signed)
 Copied from CRM [id number]. Topic: Clinical - Prescription Issue >> Jul 27, 2023 12:43 PM Stanly Early wrote: Reason for CRM: fluticasone  (FLONASE ) 50 MCG/ACT nasal spray for 90 days supply, patient would like a callback [phone]

## 2023-07-28 ENCOUNTER — Telehealth (HOSPITAL_COMMUNITY): Payer: Self-pay | Admitting: Mental Health

## 2023-07-28 ENCOUNTER — Other Ambulatory Visit: Payer: Self-pay

## 2023-07-28 MED ORDER — FLUTICASONE PROPIONATE 50 MCG/ACT NA SUSP
2.0000 | Freq: Every day | NASAL | 2 refills | Status: DC
  Filled 2023-07-28 – 2023-08-31 (×2): qty 16, 30d supply, fill #0
  Filled 2023-10-11: qty 16, 30d supply, fill #1
  Filled 2023-11-16: qty 16, 30d supply, fill #2

## 2023-07-28 NOTE — Telephone Encounter (Signed)
 Copied from CRM [id number]. Topic: Clinical - Medication Question >> Jul 28, 2023  1:51 PM Lotus Round B wrote:  Reason for CRM: pt called in to see the status of her medication fluticasone  (FLONASE ) 50 MCG/ACT nasal spray that she called about yesterday 07/27/2023 . She said that she requested to see if Dr.Fleming can make it a 90 supple and if someone can please give her a call back about it and hasn't received a call at all . She wants to talk to someone about this . Tried to call [facility] and they said the nurses are busy and doe me to put a CRM . Pt agreed to the Crm being put in but still wants to talk to someone at the clinic . Called [facility] back NO ANSWER. If someone can please give the pt a call .

## 2023-07-28 NOTE — Telephone Encounter (Signed)
 Call pt per request - left message on machine.

## 2023-07-28 NOTE — Telephone Encounter (Signed)
 Therapist returned call to Ms. Litzau. Educated unable to write letter that evaluates and denies ability to work. Offered to write letter that states her MH diagnoses; Ms. Winer accepted. Letter written and at front desk for pt pick up.

## 2023-07-28 NOTE — Telephone Encounter (Signed)
 Requested Prescriptions  Pending Prescriptions Disp Refills   fluticasone  (FLONASE ) 50 MCG/ACT nasal spray 16 g 2    Sig: Place 2 sprays into both nostrils daily.     Ear, Nose, and Throat: Nasal Preparations - Corticosteroids Passed - 07/28/2023  5:47 PM      Passed - Valid encounter within last 12 months    Recent Outpatient Visits           1 month ago Acute vaginitis   [place] [facility] Of [place]. [facility] [name], NP   3 months ago GAD (generalized anxiety disorder)   [facility] Jenelle Mis, FNP   7 months ago Recurrent vaginitis   [place] [facility] Of [place]. [facility] Winda Hastings W, NP   9 months ago Lumbar back pain with radiculopathy affecting right lower extremity   [place] [facility] Dept Of [place]. [facility] Vernell Goldsmith, MD   1 year ago Primary hypertension   [place] [facility] [place] - A Dept Of [place]. [facility] [name], NP       Future Appointments             In 5 days [name], NP [facility] - A Dept Of [place]. [facility]

## 2023-07-29 ENCOUNTER — Telehealth: Payer: Self-pay | Admitting: Nurse Practitioner

## 2023-07-29 ENCOUNTER — Other Ambulatory Visit: Payer: Self-pay

## 2023-07-29 NOTE — Telephone Encounter (Signed)
 Pt called requesting all her medications be 90 days supply not 30 days . She also requesting to change it in her chart.

## 2023-08-02 ENCOUNTER — Other Ambulatory Visit: Payer: Self-pay

## 2023-08-02 ENCOUNTER — Other Ambulatory Visit: Payer: Self-pay | Admitting: Nurse Practitioner

## 2023-08-02 ENCOUNTER — Encounter: Payer: Self-pay | Admitting: Nurse Practitioner

## 2023-08-02 ENCOUNTER — Telehealth (HOSPITAL_BASED_OUTPATIENT_CLINIC_OR_DEPARTMENT_OTHER): Payer: Self-pay | Admitting: Nurse Practitioner

## 2023-08-02 DIAGNOSIS — K219 Gastro-esophageal reflux disease without esophagitis: Secondary | ICD-10-CM | POA: Diagnosis not present

## 2023-08-02 DIAGNOSIS — E119 Type 2 diabetes mellitus without complications: Secondary | ICD-10-CM

## 2023-08-02 DIAGNOSIS — Z7984 Long term (current) use of oral hypoglycemic drugs: Secondary | ICD-10-CM

## 2023-08-02 DIAGNOSIS — J3089 Other allergic rhinitis: Secondary | ICD-10-CM

## 2023-08-02 DIAGNOSIS — N761 Subacute and chronic vaginitis: Secondary | ICD-10-CM

## 2023-08-02 DIAGNOSIS — L409 Psoriasis, unspecified: Secondary | ICD-10-CM

## 2023-08-02 MED ORDER — HYPROMELLOSE (GONIOSCOPIC) 2.5 % OP SOLN
1.0000 [drp] | Freq: Two times a day (BID) | OPHTHALMIC | 3 refills | Status: AC
  Filled 2023-08-02: qty 15, 150d supply, fill #0

## 2023-08-02 MED ORDER — FEXOFENADINE HCL 180 MG PO TABS
180.0000 mg | ORAL_TABLET | Freq: Every day | ORAL | 1 refills | Status: DC
  Filled 2023-08-02: qty 30, 30d supply, fill #0

## 2023-08-02 MED ORDER — FAMOTIDINE 20 MG PO TABS
20.0000 mg | ORAL_TABLET | Freq: Two times a day (BID) | ORAL | 1 refills | Status: DC
  Filled 2023-08-02 – 2023-08-10 (×3): qty 180, 90d supply, fill #0
  Filled 2023-11-09: qty 180, 90d supply, fill #1

## 2023-08-02 NOTE — Telephone Encounter (Signed)
 Patient identified by name and date of birth.  Patient has been advised that the medications that Valerie Reilly has sent in has 90 days supply with a refill. For medications sent by another provider she will have to reach out to them. Patient voiced understanding.

## 2023-08-02 NOTE — Progress Notes (Signed)
 Virtual Visit Consent   Valerie Reilly, you are scheduled for a virtual visit with a [facility] provider today. Just as with appointments in the office, your consent must be obtained to participate. Your consent will be active for this visit and any virtual visit you may have with one of our providers in the next 365 days. If you have a MyChart account, a copy of this consent can be sent to you electronically.  As this is a virtual visit, video technology does not allow for your provider to perform a traditional examination. This may limit your provider's ability to fully assess your condition. If your provider identifies any concerns that need to be evaluated in person or the need to arrange testing (such as labs, EKG, etc.), we will make arrangements to do so. Although advances in technology are sophisticated, we cannot ensure that it will always work on either your end or our end. If the connection with a video visit is poor, the visit may have to be switched to a telephone visit. With either a video or telephone visit, we are not always able to ensure that we have a secure connection.  By engaging in this virtual visit, you consent to the provision of healthcare and authorize for your insurance to be billed (if applicable) for the services provided during this visit. Depending on your insurance coverage, you may receive a charge related to this service.  I need to obtain your verbal consent now. Are you willing to proceed with your visit today? Valerie Reilly has provided verbal consent on 08/02/2023 for a virtual visit (video or telephone). [name], NP  Date: 08/02/2023 4:50 PM   Virtual Visit via Video Note   I, [name], connected with  Valerie Reilly  ([id number], [birth year]) on 08/02/23 at  4:10 PM EDT by a video-enabled telemedicine application and verified that I am speaking with the correct person using two identifiers.  Location: Patient: Virtual Visit Location  Patient: Home Provider: Virtual Visit Location Provider: Home Office   I discussed the limitations of evaluation and management by telemedicine and the availability of in person appointments. The patient expressed understanding and agreed to proceed.    History of Present Illness: Valerie Reilly is a 48 y.o. who identifies as a female who was assigned female at birth, and is being seen today for psoriasis, chronic BV and diabetic foot care.    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 Was previously on Tremfya  however her insurance does not pay for this any longer. She needs to see Dermatology for recommendations of alternate therapy.    OB GYN She would like a referral to Gynecology for menopause symptoms of mood lability. She does have a history of bipolar disorder and is unsure if her mood lability is being caused from her bipolar disorder or menopause.  She has not had a menstrual cycle in over a year.  She also has chronic BV and would like to discuss recommendations to help treat this.     Podiatry Requesting referral for diabetic foot exam.  She also endorses ingrown toenails of both big toes however this was not visible to me on video today.       Problems:  Patient Active Problem List   Diagnosis Date Noted   Vaginitis 12/15/2022   Neuropathy 10/15/2022   High risk heterosexual behavior 10/15/2022   Lumbar back pain with radiculopathy affecting right lower extremity 10/15/2022  Calculus of gallbladder without cholecystitis without obstruction    Bipolar I disorder (HCC) 09/26/2019   GAD (generalized anxiety disorder) 09/26/2019   Insomnia due to other mental disorder 09/26/2019   Long term use of drug 01/04/2019   Postoperative visit 01/04/2019   Dyslipidemia 07/29/2018   Leukocytosis 04/06/2017   Uncontrolled type 2 diabetes mellitus with hyperglycemia (HCC) 08/18/2016   Cannabis abuse, in remission 08/15/2014   Acquired  hypothyroidism 06/25/2014   Essential hypertension 06/25/2014   Borderline personality disorder (HCC) 04/30/2014   Allergic rhinitis 03/30/2012   GERD (gastroesophageal reflux disease) 03/30/2012   Psoriasis 03/30/2012   Vitamin D  deficiency 03/30/2012    Allergies: No Known Allergies Medications:  Current Outpatient Medications:    albuterol  (VENTOLIN  HFA) 108 (90 Base) MCG/ACT inhaler, Inhale 2 puffs into the lungs every 6 (six) hours as needed for wheezing or shortness of breath., Disp: 6.7 g, Rfl: 2   benazepril  (LOTENSIN ) 20 MG tablet, Take 1 tablet (20 mg total) by mouth daily., Disp: 90 tablet, Rfl: 1   busPIRone  (BUSPAR ) 30 MG tablet, Take 1 tablet (30 mg total) by mouth 2 (two) times daily., Disp: 180 tablet, Rfl: 1   cholecalciferol (VITAMIN D ) 1000 UNITS tablet, Take 1,000 Units by mouth daily., Disp: , Rfl:    famotidine  (PEPCID ) 20 MG tablet, Take 1 tablet (20 mg total) by mouth 2 (two) times daily., Disp: 180 tablet, Rfl: 1   fexofenadine  (ALLEGRA ) 180 MG tablet, Take 1 tablet (180 mg total) by mouth daily., Disp: 90 tablet, Rfl: 1   fluticasone  (FLONASE ) 50 MCG/ACT nasal spray, Place 2 sprays into both nostrils daily., Disp: 16 g, Rfl: 2   gabapentin  (NEURONTIN ) 100 MG capsule, Take 1 capsule (100 mg total) by mouth 2 (two) times daily., Disp: 180 capsule, Rfl: 1   glipiZIDE  (GLUCOTROL  XL) 10 MG 24 hr tablet, Take 1 tablet (10 mg total) by mouth daily with breakfast., Disp: 90 tablet, Rfl: 1   hydroxypropyl methylcellulose / hypromellose (ISOPTO TEARS / GONIOVISC) 2.5 % ophthalmic solution, Place 1 drop into both eyes in the morning and at bedtime., Disp: 15 mL, Rfl: 3   hydrOXYzine  (ATARAX ) 25 MG tablet, Take 0.5-1 tablets (12.5-25 mg total) by mouth 2 (two) times daily as needed for anxiety., Disp: 90 tablet, Rfl: 1   levothyroxine  (SYNTHROID ) 50 MCG tablet, Take 1 tablet (50 mcg total) by mouth daily before breakfast., Disp: 90 tablet, Rfl: 1   lidocaine  (LIDODERM ) 5 %, Place  1 patch onto the skin daily. Remove & Discard patch within 12 hours daily, to lower back, Disp: 30 patch, Rfl: 0   lithium  carbonate 300 MG capsule, Take 2 capsules (600 mg total) by mouth in the morning AND 3 capsules (900 mg total) at bedtime., Disp: 450 capsule, Rfl: 1   lurasidone  (LATUDA ) 20 MG TABS tablet, Take 1 tablet (20 mg total) by mouth daily with supper., Disp: 30 tablet, Rfl: 2   metFORMIN  (GLUCOPHAGE ) 1000 MG tablet, Take 1 tablet (1,000 mg total) by mouth 2 (two) times daily with a meal., Disp: 180 tablet, Rfl: 1   Multiple Vitamin (MULTIVITAMIN) capsule, Take 1 capsule by mouth daily., Disp: , Rfl:    simvastatin  (ZOCOR ) 40 MG tablet, Take 1 tablet (40 mg total) by mouth daily., Disp: 90 tablet, Rfl: 1   traZODone  (DESYREL ) 150 MG tablet, Take 2 tablets (300 mg total) by mouth at bedtime., Disp: 180 tablet, Rfl: 1  Observations/Objective: Patient is well-developed, well-nourished in no acute distress.  Resting comfortably  at home.  Head is normocephalic, atraumatic.  No labored breathing.  Speech is clear and coherent with logical content.  Patient is alert and oriented at baseline.    Assessment and Plan: 1. Psoriasis (Primary) - Ambulatory referral to Dermatology  2. Chronic vaginitis - Ambulatory referral to Gynecology  3. Controlled type 2 diabetes mellitus without complication, without long-term current use of insulin (HCC) - Ambulatory referral to Podiatry  4. Gastroesophageal reflux disease without esophagitis - famotidine  (PEPCID ) 20 MG tablet; Take 1 tablet (20 mg total) by mouth 2 (two) times daily.  Dispense: 180 tablet; Refill: 1  5. Environmental and seasonal allergies - fexofenadine  (ALLEGRA ) 180 MG tablet; Take 1 tablet (180 mg total) by mouth daily.  Dispense: 90 tablet; Refill: 1   Follow Up Instructions: I discussed the assessment and treatment plan with the patient. The patient was provided an opportunity to ask questions and all were answered.  The patient agreed with the plan and demonstrated an understanding of the instructions.  A copy of instructions were sent to the patient via MyChart unless otherwise noted below.    The patient was advised to call back or seek an in-person evaluation if the symptoms worsen or if the condition fails to improve as anticipated.    Alanee Ting W Nicolet Griffy, NP

## 2023-08-04 DIAGNOSIS — Z114 Encounter for screening for human immunodeficiency virus [HIV]: Secondary | ICD-10-CM | POA: Diagnosis not present

## 2023-08-04 DIAGNOSIS — Z113 Encounter for screening for infections with a predominantly sexual mode of transmission: Secondary | ICD-10-CM | POA: Diagnosis not present

## 2023-08-04 DIAGNOSIS — N760 Acute vaginitis: Secondary | ICD-10-CM | POA: Diagnosis not present

## 2023-08-08 ENCOUNTER — Telehealth: Payer: Self-pay | Admitting: Nurse Practitioner

## 2023-08-08 ENCOUNTER — Other Ambulatory Visit: Payer: Self-pay

## 2023-08-08 ENCOUNTER — Other Ambulatory Visit: Payer: Self-pay | Admitting: Nurse Practitioner

## 2023-08-08 DIAGNOSIS — Z720 Tobacco use: Secondary | ICD-10-CM

## 2023-08-08 DIAGNOSIS — H5213 Myopia, bilateral: Secondary | ICD-10-CM | POA: Diagnosis not present

## 2023-08-08 MED ORDER — NICOTINE 21 MG/24HR TD PT24
21.0000 mg | MEDICATED_PATCH | Freq: Every day | TRANSDERMAL | 0 refills | Status: AC
  Filled 2023-08-08: qty 28, 28d supply, fill #0

## 2023-08-08 NOTE — Telephone Encounter (Signed)
 Patient identified by name and date of birth.  Patient aware of response from provider and voiced understanding.

## 2023-08-08 NOTE — Telephone Encounter (Signed)
 Patch has been sent. I can not guarantee it is covered by insurance.

## 2023-08-08 NOTE — Telephone Encounter (Signed)
 Copied from CRM [id number]. Topic: Clinical - Medication Question >> Aug 08, 2023  8:58 AM Rosamond Comes wrote:  Reason for CRM: patient calling in asking for Nicotine  patch prescription  was last prescribed 06/14/2022 patient has a package that's has expired on the box  Patient pharmacy  [facility] [address] [place] [address] Phone: [phone] Fax: [phone]  Patient phone [phone]  ok to leave detailed message

## 2023-08-09 ENCOUNTER — Other Ambulatory Visit: Payer: Self-pay

## 2023-08-10 ENCOUNTER — Other Ambulatory Visit: Payer: Self-pay

## 2023-08-11 ENCOUNTER — Ambulatory Visit (INDEPENDENT_AMBULATORY_CARE_PROVIDER_SITE_OTHER): Admitting: Podiatry

## 2023-08-11 ENCOUNTER — Other Ambulatory Visit: Payer: Self-pay

## 2023-08-11 ENCOUNTER — Encounter: Payer: Self-pay | Admitting: Podiatry

## 2023-08-11 DIAGNOSIS — M79671 Pain in right foot: Secondary | ICD-10-CM

## 2023-08-11 DIAGNOSIS — L600 Ingrowing nail: Secondary | ICD-10-CM | POA: Diagnosis not present

## 2023-08-11 DIAGNOSIS — M79672 Pain in left foot: Secondary | ICD-10-CM | POA: Diagnosis not present

## 2023-08-11 DIAGNOSIS — B351 Tinea unguium: Secondary | ICD-10-CM

## 2023-08-11 NOTE — Addendum Note (Signed)
 Addended by: Reche Canales on: 08/11/2023 03:45 PM   Modules accepted: Orders

## 2023-08-11 NOTE — Progress Notes (Signed)
 Patient presents for evaluation and treatment of tenderness and some redness around nails feet.  Tenderness around toes with walking and wearing shoes.  Ox nails especially have been bothering along the edges.  So the get red sometimes and and ingrown in the past.  She does have psoriasis  Physical exam:  General appearance: Alert, pleasant, and in no acute distress.  Vascular: Pedal pulses: DP 2/4 B/L, PT 2/4 B/L.  Mild edema lower legs bilaterally.  Capillary fill time immediate  Neurological:  No paresthesias or burning noted.  Normal Achilles tendon reflex.  No clonus or spasticity noted.  Light touch intact bilaterally  Dermatologic:  Nails thickened, disfigured, discolored 1-5 BL with subungual debris.  Redness and hypertrophic nail folds along nail folds bilaterally but no signs of drainage or infection.  Ingrown nail borders especially on the hallux bilaterally.  Tenderness specifically along the nail folds with redness and hypertrophy of the nail fold.  Musculoskeletal:  Mild hammertoe deformities 2 through 5 bilaterally.  Normal muscle strength around foot and ankle with normal range of motion ankle and subtalar joint   Diagnosis: 1. Painful onychomycotic nails 1 through 5 bilaterally. 2. Pain toes 1 through 5 bilaterally. 3.  Ingrown nails bilaterally  Plan: -New patient office visit level 3 for evaluation and management.  Modifier 25.  Discussed with her the ingrown nail borders on the hallux and if that these continue to bother her would recommend permanent removal with matricectomy of the nail borders both bilaterally . Discussed with her the onychomycosis of the nail and recommended treatment with oral Lamisil.  Discussed with the Lamisil and potential side effects.  Discussed monitoring with blood labs for liver function tests while taking medication.  Discussed with her also that psoriasis can cause similar changes although her nails and her toes appear to be more mycotic  than psoriatic. -Debrided onychomycotic nails 1 through 5 bilaterally. -Rx Lamisil 250 mg, 1 p.o. daily - Order for blood draw for LFTs  Return 4 weeks for Lamisil No. 2

## 2023-08-12 ENCOUNTER — Other Ambulatory Visit: Payer: Self-pay

## 2023-08-12 LAB — HEPATIC FUNCTION PANEL
AG Ratio: 1.8 (calc) (ref 1.0–2.5)
ALT: 38 U/L — ABNORMAL HIGH (ref 6–29)
AST: 23 U/L (ref 10–35)
Albumin: 4.4 g/dL (ref 3.6–5.1)
Alkaline phosphatase (APISO): 65 U/L (ref 31–125)
Bilirubin, Direct: 0.1 mg/dL (ref 0.0–0.2)
Globulin: 2.4 g/dL (ref 1.9–3.7)
Indirect Bilirubin: 0.3 mg/dL (ref 0.2–1.2)
Total Bilirubin: 0.4 mg/dL (ref 0.2–1.2)
Total Protein: 6.8 g/dL (ref 6.1–8.1)

## 2023-08-18 ENCOUNTER — Telehealth: Payer: Self-pay | Admitting: Podiatry

## 2023-08-18 NOTE — Telephone Encounter (Signed)
 Patient called in reference to labs done at last visit- is requesting a call back to review and see if the lamisil will be prescribed. Thanks

## 2023-08-19 ENCOUNTER — Other Ambulatory Visit: Payer: Self-pay

## 2023-08-19 ENCOUNTER — Ambulatory Visit (INDEPENDENT_AMBULATORY_CARE_PROVIDER_SITE_OTHER): Admitting: Mental Health

## 2023-08-19 DIAGNOSIS — F319 Bipolar disorder, unspecified: Secondary | ICD-10-CM | POA: Diagnosis not present

## 2023-08-19 MED ORDER — TERBINAFINE HCL 250 MG PO TABS
250.0000 mg | ORAL_TABLET | Freq: Every day | ORAL | 0 refills | Status: DC
  Filled 2023-08-19: qty 30, 30d supply, fill #0

## 2023-08-19 NOTE — Progress Notes (Unsigned)
 THERAPIST PROGRESS NOTE Virtual Visit via Video Note  I connected with Archer S Scialdone on 08/19/23 at  9:00 AM EDT by a video enabled telemedicine application and verified that I am speaking with the correct person using two identifiers.  Location: Patient: home address on file Provider: home office   I discussed the limitations of evaluation and management by telemedicine and the availability of in person appointments. The patient expressed understanding and agreed to proceed.  I discussed the assessment and treatment plan with the patient. The patient was provided an opportunity to ask questions and all were answered. The patient agreed with the plan and demonstrated an understanding of the instructions.   The patient was advised to call back or seek an in-person evaluation if the symptoms worsen or if the condition fails to improve as anticipated.  I provided 54 minutes of non-face-to-face time during this encounter.   Loman Risk, [facility]   Session Time: 9:04 am (54 minutes)  Participation Level: Active  Behavioral Response: CasualAlertDysphoric  Type of Therapy: Individual Therapy  Treatment Goals addressed: STG: Deal better. Sherika will increase management of stress AEB ability to process events in balanced way with engagement in at least x 1 coping skills for stress as needed with the next 90 days.   ProgressTowards Goals: Progressing  Interventions: Supportive  Summary:  Shima S Massimo is a 48 y.o. female who presents with dx of bipolar disorder, borderline personality disorder and generalized anxiety. Presents to session alert and oriented; mood and affect adequate. Shares for moods to stabilized and shares to feel as if she is doing better. Notes some concern for the ongoing stability of her mood noting concern she will fall back into feelings of depression. Notes to have started to take a new medication as well feels as not sleeping as much has also  helped. Shares to be doing more around the house as well as at least weekly outings to the food pantry. 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 Agrees to track sxs and working balance days and maintenance of stability. Coping with stress well with engagement of doing at least x 1 activity day. Denies safety concerns.   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 Completed check in and assessed for current level of functioning, sxs management and stressors. Provided safe space for Deborah to share thoughts and feelings in regard to current stressors and feelings of stability in moods. Explored ability to cope with financial stressors and actors that have been supportive of increase in mood. Encouraged tracking moods and explored hx of manic sxs when they have presented in the past. Processed stable moods vs. Hypomania and mania. Explored engagement in things she enjoys and engaging in behavioral activation daily. Provided support and encouragement; validated feelings. Reviewed session and provided follow up. Ongoing work towards progress with goals.   Plan: Return again in  x 2 weeks.  Diagnosis: Bipolar I disorder (HCC)  Collaboration of Care: Other None  Patient/Guardian was advised Release of Information must be obtained prior to any record release in order to collaborate their care with an outside provider. Patient/Guardian was advised if they have not already done so to contact the registration department to sign  all necessary forms in order for us  to release information regarding their care.   Consent: Patient/Guardian gives verbal consent for treatment and assignment of benefits for  services provided during this visit. Patient/Guardian expressed understanding and agreed to proceed.   Carmel Chimes Grover, [facility] 08/19/2023

## 2023-08-22 ENCOUNTER — Other Ambulatory Visit: Payer: Self-pay

## 2023-08-24 ENCOUNTER — Other Ambulatory Visit: Payer: Self-pay

## 2023-08-26 ENCOUNTER — Other Ambulatory Visit: Payer: Self-pay

## 2023-08-31 ENCOUNTER — Other Ambulatory Visit: Payer: Self-pay

## 2023-09-02 ENCOUNTER — Ambulatory Visit (INDEPENDENT_AMBULATORY_CARE_PROVIDER_SITE_OTHER): Admitting: Mental Health

## 2023-09-02 DIAGNOSIS — F411 Generalized anxiety disorder: Secondary | ICD-10-CM | POA: Diagnosis not present

## 2023-09-02 DIAGNOSIS — F319 Bipolar disorder, unspecified: Secondary | ICD-10-CM

## 2023-09-02 NOTE — Progress Notes (Signed)
 THERAPIST PROGRESS NOTE Virtual Visit via Video Note  I connected with Valerie Reilly on 09/02/23 at  9:00 AM EDT by a video enabled telemedicine application and verified that I am speaking with the correct person using two identifiers.  Location: Patient: home address on file Provider: home office   I discussed the limitations of evaluation and management by telemedicine and the availability of in person appointments. The patient expressed understanding and agreed to proceed.  I discussed the assessment and treatment plan with the patient. The patient was provided an opportunity to ask questions and all were answered. The patient agreed with the plan and demonstrated an understanding of the instructions.   The patient was advised to call back or seek an in-person evaluation if the symptoms worsen or if the condition fails to improve as anticipated.  I provided 49 minutes of non-face-to-face time during this encounter.   Ty Bernice Savant, [facility]   Session Time: 9:04 am (   Participation Level: Active  Behavioral Response: CasualAlertIrritable  Type of Therapy: Individual Therapy  Treatment Goals addressed: STG: Deal better. Syvanna will increase management of stress AEB ability to process events in balanced way with engagement in at least x 1 coping skills for stress as needed with the next 90 days.   ProgressTowards Goals: Progressing  Interventions: CBT and Supportive  Summary:  Valerie Reilly is a 48 y.o. female who presents with dx of bipolar disorder, borderline personality disorder and generalized anxiety. Presents to session alert and oriented; mood and affect irritable. Shares for moods to have be low, sharing to ave gained weight with an increased appetite and denies to feel as if new medications has been helpful. Shares last session believes she was  a little manic and now in depression. Shares to spend large amount of time in the house and will do house  work anda occasionally get out with fiance. Explores with therapist working to increase activity in day. 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 Agrees to explore volunteer opportunities provided by therapist. Notes would like fiance to work more hours steadily as she has debts to pay; denies ability to work right now with moods fluctuating. Explores options for increased activity with day with going to [facility], pool fishing and going for walks. Denies safety concerns.  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 Completed check in and assessed for current level of functioning, sxs management and stressors. Provided safe space for Tikisha to share thoughts and feelings in regard to current stressors and feelings of instability in moods. Explored ability to engage in behavioral activation and encouraged volunteering to support in increased interaction and engagement in days as inactivation can contribute to low moods. Explored volunteer opportunities in area and encouraged exploration of hobbies. Reviewed session and provided follow up.  Plan: Return again in x 3 weeks.  Diagnosis: Bipolar I disorder (HCC)  GAD (generalized anxiety disorder)  Collaboration of Care: Other None  Patient/Guardian was advised Release of Information must be obtained prior to any record release in order to collaborate their care with an outside provider. Patient/Guardian was advised if they have not already done so to contact the registration department to sign all necessary forms in order for us  to release information regarding their care.   Consent: Patient/Guardian gives verbal consent for treatment and assignment of benefits for services provided during this visit. Patient/Guardian expressed understanding and  agreed to proceed.   Ty Asal [place], [facility] 09/02/2023

## 2023-09-06 NOTE — Progress Notes (Unsigned)
 BH MD Outpatient Progress Note  09/08/2023 4:29 PM Valerie Reilly  MRN:  [id number]  Assessment:  Valerie Reilly presents for follow-up evaluation. Today, 09/08/23, patient endorses tolerability of Latuda  although notes minimal benefit thus far. On interview however patient does appear more engaged with brightening of affect and on chart review of therapy notes appears to have had some days with relief from depressive symptoms. As such, will pursue further titration of Latuda  as below. Notably, patient discloses today that she has not been taking lithium  as prescribed for several years now due to issues with twice a day dosing. Nonetheless, most recent level was therapeutic and will update current prescription to reflect total daily dose that patient has been taking. Will consolidate to nighttime to promote adherence.    RTC in 8 weeks by video.  Identifying Information: Valerie Reilly is a 48 y.o. female with a history of bipolar 1 disorder, borderline personality disorder, GAD, T2DM, hypothyroidism on Synthroid , HTN, and HLD who is an established patient with [facility] participating in follow-up via video conferencing. On further review of historical bipolar diagnosis, this diagnosis is felt to be accurate as patient reports history of discrete manic episodes requiring hospitalization during periods of non-adherence to Lithium .   Plan:  # Bipolar 1 disorder currently depressed  GAD # Borderline personality disorder Past medication trials: Effexor, Depakote (brief) Status of problem: mild improvement Interventions: -- CONSOLIDATE lithium  to 900 mg nightly (patient reports she has been taking lithium  as 600 mg qAM + 300 mg at bedtime for the past several months despite prescription for higher dose) -- INCREASE Latuda  to 40 mg qEvening with supper (s5/7/25, i7/3/25) -- Continue buspirone  30 mg 2 times daily -- Continue gabapentin  100 mg BID -- Continue Atarax   12.5-25 mg BID PRN anxiety (using infrequently) -- Continue individual psychotherapy with Valerie Reilly [facility]  # Subjective memory deficits Status of problem: acute Interventions: -- Plan for in person assessment in August to administer MOCA -- Plan to taper medications that may be contributing to cognitive dulling as tolerated (trazodone , gabapentin )  # Sleep regulation Past medication trials: unknown Status of problem: stable Interventions: -- Continue trazodone  300 mg at bedtime   # High risk medication use Interventions: -- Lithium :  -- Kidney function within normal limits 04/13/23  -- TSH within normal limits 04/13/23  -- Lithium  level 0.8 06/29/23  Patient was given contact information for behavioral health clinic and was instructed to call 911 for emergencies.   Subjective:  Chief Complaint:  Chief Complaint  Patient presents with   Medication Management    Interval History:   Patient reports she has not noticed much difference from Latuda ; denies adverse effects. Mood remains more on the depressed side although identifies that today she is feeling a bit better. Energy/motivation varies day to day but not persistently low. Sleeping well with use of trazodone . Previously experiencing passive SI but denies currently. Emergency resources reviewed.   Denies recent symptoms of hypomania/mania; has been months since she experienced any of these symptoms.   Patient then states I should have told you this a long time ago - states she has been taking a lower dose of lithium  than prescribed for years now. Has never taken nighttime dose until this past April when dose was increased and even then only took 1 capsule instead of 3. States this was because she always had trouble remembering twice a day dosing. Amenable to consolidation of current dose to nighttime.  As she has experienced some days with relief from depressive sx since starting Latuda , amenable to further  titration.   Visit Diagnosis:    ICD-10-CM   1. Bipolar I disorder (HCC)  F31.9 lithium  carbonate 300 MG capsule    2. GAD (generalized anxiety disorder)  F41.1     3. Borderline personality disorder (HCC)  F60.3       Past Psychiatric History:  Diagnoses: Bipolar 1 disorder, borderline personality disorder, cannabis use disorder in remission Medication trials: Effexor, Depakote (brief) Hospitalizations: multiple - last hospitalization at [facility] in 2014 for episode of mania Suicide attempt: yes - many years ago; hx of 2-3 previous attempts  Access to guns: denies Substance use:   -- Etoh: denies  -- Denies use of cannabis or other illicit drugs  -- Tobacco: 2 ppd; precontemplative regarding cessation  Past Medical History:  Past Medical History:  Diagnosis Date   Allergy    Anxiety    Asthma    Bipolar disorder (HCC)    Diabetes mellitus    Diabetes mellitus, type II (HCC)    Gallstone    GERD (gastroesophageal reflux disease)    History of borderline personality disorder    Hypercholesteremia    Hypertension    Obesity    Psoriasis    Psoriasis    Seasonal allergies    Thyroid  disease     Past Surgical History:  Procedure Laterality Date   CHOLECYSTECTOMY N/A 10/02/2021   Procedure: LAPAROSCOPIC CHOLECYSTECTOMY;  Surgeon: Evonnie Dorothyann LABOR, DO;  Location: [facility];  Service: General;  Laterality: N/A;   TONSILLECTOMY     TUBAL LIGATION      Family Psychiatric History:  Maternal uncle: depression, alcohol use Mother: personality disorder  Family History:  Family History  Problem Relation Age of Onset   Personality disorder Mother    Diabetes Mother    Diabetes Father    Alcohol abuse Maternal Uncle    Depression Maternal Uncle    Diabetes Maternal Grandfather    Breast cancer Neg Hx    Stomach cancer Neg Hx    Colon cancer Neg Hx    Esophageal cancer Neg Hx    Colon polyps Neg Hx    Crohn's disease Neg Hx    Ulcerative colitis Neg Hx     Rectal cancer Neg Hx     Social History:  Social History   Socioeconomic History   Marital status: Divorced    Spouse name: Not on file   Number of children: 1   Years of education: Not on file   Highest education level: Associate degree: occupational, Scientist, product/process development, or vocational program  Occupational History   Occupation: delivery newspaper  Tobacco Use   Smoking status: Every Day    Current packs/day: 2.00    Average packs/day: 2.0 packs/day for 30.0 years (60.0 ttl pk-yrs)    Types: Cigarettes    Passive exposure: Current   Smokeless tobacco: Never  Vaping Use   Vaping status: Never Used  Substance and Sexual Activity   Alcohol use: Yes    Comment: occasional- a few times a year   Drug use: No    Types: Marijuana    Comment: last time was 3 yrs ago   Sexual activity: Yes    Birth control/protection: Surgical    Comment: Tubal ligation  Other Topics Concern   Not on file  Social History Narrative   Not on file   Social Drivers of Health   Financial Resource Strain: Low  Risk  (12/15/2022)   Overall Financial Resource Strain (CARDIA)    Difficulty of Paying Living Expenses: Not hard at all  Food Insecurity: No Food Insecurity (12/15/2022)   Hunger Vital Sign    Worried About Running Out of Food in the Last Year: Never true    Ran Out of Food in the Last Year: Never true  Transportation Needs: No Transportation Needs (12/15/2022)   PRAPARE - Administrator, Civil Service (Medical): No    Lack of Transportation (Non-Medical): No  Physical Activity: Inactive (12/15/2022)   Exercise Vital Sign    Days of Exercise per Week: 0 days    Minutes of Exercise per Session: 0 min  Stress: No Stress Concern Present (12/15/2022)   [employer] of Occupational Health - Occupational Stress Questionnaire    Feeling of Stress : Not at all  Social Connections: Socially Isolated (12/15/2022)   Social Connection and Isolation Panel    Frequency of Communication with  Friends and Family: More than three times a week    Frequency of Social Gatherings with Friends and Family: Once a week    Attends Religious Services: Never    Database administrator or Organizations: No    Attends Engineer, structural: Never    Marital Status: Divorced    Allergies: No Known Allergies  Current Medications: Current Outpatient Medications  Medication Sig Dispense Refill   busPIRone  (BUSPAR ) 30 MG tablet Take 1 tablet (30 mg total) by mouth 2 (two) times daily. 180 tablet 1   gabapentin  (NEURONTIN ) 100 MG capsule Take 1 capsule (100 mg total) by mouth 2 (two) times daily. 180 capsule 1   lurasidone  (LATUDA ) 40 MG TABS tablet Take 1 tablet (40 mg total) by mouth daily with supper. Should be taken with food (at least 350 calories). 30 tablet 2   traZODone  (DESYREL ) 150 MG tablet Take 2 tablets (300 mg total) by mouth at bedtime. 180 tablet 1   albuterol  (VENTOLIN  HFA) 108 (90 Base) MCG/ACT inhaler Inhale 2 puffs into the lungs every 6 (six) hours as needed for wheezing or shortness of breath. 6.7 g 2   benazepril  (LOTENSIN ) 20 MG tablet Take 1 tablet (20 mg total) by mouth daily. 90 tablet 1   cholecalciferol (VITAMIN D ) 1000 UNITS tablet Take 1,000 Units by mouth daily.     famotidine  (PEPCID ) 20 MG tablet Take 1 tablet (20 mg total) by mouth 2 (two) times daily. 180 tablet 1   fexofenadine  (ALLEGRA ) 180 MG tablet Take 1 tablet (180 mg total) by mouth daily. 90 tablet 1   fluticasone  (FLONASE ) 50 MCG/ACT nasal spray Place 2 sprays into both nostrils daily. 16 g 2   glipiZIDE  (GLUCOTROL  XL) 10 MG 24 hr tablet Take 1 tablet (10 mg total) by mouth daily with breakfast. 90 tablet 1   hydroxypropyl methylcellulose / hypromellose (ISOPTO TEARS / GONIOVISC) 2.5 % ophthalmic solution Place 1 drop into both eyes in the morning and at bedtime. 15 mL 3   hydrOXYzine  (ATARAX ) 25 MG tablet Take 0.5-1 tablets (12.5-25 mg total) by mouth 2 (two) times daily as needed for anxiety. 90  tablet 1   levothyroxine  (SYNTHROID ) 50 MCG tablet Take 1 tablet (50 mcg total) by mouth daily before breakfast. 90 tablet 1   lidocaine  (LIDODERM ) 5 % Place 1 patch onto the skin daily. Remove & Discard patch within 12 hours daily, to lower back 30 patch 0   lithium  carbonate 300 MG capsule Take 3 capsules (  900 mg total) by mouth at bedtime. 270 capsule 1   metFORMIN  (GLUCOPHAGE ) 1000 MG tablet Take 1 tablet (1,000 mg total) by mouth 2 (two) times daily with a meal. 180 tablet 1   Multiple Vitamin (MULTIVITAMIN) capsule Take 1 capsule by mouth daily.     nicotine  (NICODERM CQ  - DOSED IN MG/24 HOURS) 21 mg/24hr patch Place 1 patch (21 mg total) onto the skin daily. 42 patch 0   simvastatin  (ZOCOR ) 40 MG tablet Take 1 tablet (40 mg total) by mouth daily. 90 tablet 1   terbinafine  (LAMISIL ) 250 MG tablet Take 1 tablet (250 mg total) by mouth daily. 30 tablet 0   No current facility-administered medications for this visit.    ROS: Denies any physical complaints  Objective:  Psychiatric Specialty Exam: There were no vitals taken for this visit.There is no height or weight on file to calculate BMI.  General Appearance: Casual and Fairly Groomed  Eye Contact:  Good  Speech:  Clear and Coherent and Normal Rate  Volume:  Normal  Mood:  better today  Affect:  Euthymic; calm  Thought Content: Denies AVH; IOR; paranoia   Suicidal Thoughts:  Denies passive/active SI  Homicidal Thoughts:  No  Thought Process:  Goal Directed and Linear  Orientation:  Full (Time, Place, and Person)    Memory:  Grossly oriented  Judgment:  Good  Insight:  Fair  Concentration:  Concentration: Good  Recall:  NA  Fund of Knowledge: Good  Language: Good  Psychomotor Activity:  Normal  Akathisia:  NA  AIMS (if indicated): not done  Assets:  Communication Skills Desire for Improvement Housing Intimacy Leisure Time Social Support Vocational/Educational  ADL's:  Intact  Cognition: WNL  Sleep:  Good    PE: General: sits comfortably in view of camera; no acute distress  Pulm: no increased work of breathing on room air  MSK: all extremity movements appear intact  Neuro: no focal neurological deficits observed  Gait & Station: unable to assess by video    Metabolic Disorder Labs: Lab Results  Component Value Date   HGBA1C 6.1 (H) 06/15/2023   No results found for: PROLACTIN Lab Results  Component Value Date   CHOL 135 12/15/2022   TRIG 109 12/15/2022   HDL 41 12/15/2022   CHOLHDL 3.3 12/15/2022   LDLCALC 74 12/15/2022   LDLCALC 54 06/10/2022   Lab Results  Component Value Date   TSH 1.630 04/13/2023   TSH 1.200 12/15/2022    Therapeutic Level Labs: Lab Results  Component Value Date   LITHIUM  0.8 06/29/2023   LITHIUM  0.6 04/13/2023   No results found for: VALPROATE No results found for: CBMZ  Screenings:  AUDIT    Flowsheet Row Office Visit from 06/10/2022 in [facility] Dept Of [place]. [facility]  Alcohol Use Disorder Identification Test Final Score (AUDIT) 5   GAD-7    Flowsheet Row Office Visit from 06/15/2023 in [facility] [place] - A Dept Of [place]. [facility] Office Visit from 04/05/2023 in [facility] Office Visit from 12/15/2022 in [facility] [place] - A Dept Of [place]. [facility] Office Visit from 10/15/2022 in [facility] - A Dept Of [place]. [facility] Counselor from 08/09/2022 in [facility]  Total GAD-7 Score 13 11 0 0 13   PHQ2-9    Flowsheet Row Office Visit  from 06/15/2023 in [facility] - A Dept Of [place]. [facility] Office Visit from 04/05/2023 in [facility] Office Visit from 12/15/2022 in [facility] [place] [facility] A Dept Of [place]. [facility] Office Visit from 10/15/2022 in [facility]  [place] - A Dept Of [place]. [facility] Counselor from 08/09/2022 in [facility]  PHQ-2 Total Score 6 4 0 0 5  PHQ-9 Total Score 16 9 0 -- 15   Flowsheet Row Video Visit from 11/05/2021 in [facility] Pre-Admission Testing 60 from 09/29/2021 in [facility] Video Visit from 06/12/2021 in [facility]  C-SSRS RISK CATEGORY No Risk No Risk Low Risk    Collaboration of Care: Collaboration of Care: Medication Management AEB ongoing medication management, Psychiatrist AEB established with this provider, and Referral or follow-up with counselor/therapist AEB established with individual therapist  Patient/Guardian was advised Release of Information must be obtained prior to any record release in order to collaborate their care with an outside provider. Patient/Guardian was advised if they have not already done so to contact the registration department to sign all necessary forms in order for us  to release information regarding their care.   Consent: Patient/Guardian gives verbal consent for treatment and assignment of benefits for services provided during this visit. Patient/Guardian expressed understanding and agreed to proceed.   Televisit via video: I connected with patient on 09/08/23 at  2:00 PM EDT by a video enabled telemedicine application and verified that I am speaking with the correct person using two identifiers.  Location: Patient: home address in [place] Provider: remote office in [place]   I discussed the limitations of evaluation and management by telemedicine and the availability of in person appointments. The patient expressed understanding and agreed to proceed.  I discussed the assessment and treatment plan with the patient. The patient was provided an opportunity to ask questions and all were answered. The patient agreed with the plan and demonstrated an understanding of the  instructions.   The patient was advised to call back or seek an in-person evaluation if the symptoms worsen or if the condition fails to improve as anticipated.  I provided 30 minutes dedicated to the care of this patient via video on the date of this encounter to include chart review, face-to-face time with the patient, medication management/counseling, brief supportive psychotherapy.  Asier Desroches A Don Tiu 09/08/2023, 4:29 PM

## 2023-09-07 ENCOUNTER — Other Ambulatory Visit: Payer: Self-pay

## 2023-09-08 ENCOUNTER — Telehealth (HOSPITAL_COMMUNITY): Admitting: Psychiatry

## 2023-09-08 ENCOUNTER — Ambulatory Visit: Admitting: Podiatry

## 2023-09-08 ENCOUNTER — Other Ambulatory Visit: Payer: Self-pay

## 2023-09-08 ENCOUNTER — Encounter (HOSPITAL_COMMUNITY): Payer: Self-pay | Admitting: Psychiatry

## 2023-09-08 DIAGNOSIS — F319 Bipolar disorder, unspecified: Secondary | ICD-10-CM

## 2023-09-08 DIAGNOSIS — F603 Borderline personality disorder: Secondary | ICD-10-CM | POA: Diagnosis not present

## 2023-09-08 DIAGNOSIS — F411 Generalized anxiety disorder: Secondary | ICD-10-CM

## 2023-09-08 MED ORDER — LURASIDONE HCL 40 MG PO TABS
40.0000 mg | ORAL_TABLET | Freq: Every day | ORAL | 2 refills | Status: DC
  Filled 2023-09-08: qty 30, 30d supply, fill #0
  Filled 2023-10-09: qty 30, 30d supply, fill #1

## 2023-09-08 MED ORDER — HYDROXYZINE HCL 25 MG PO TABS
12.5000 mg | ORAL_TABLET | Freq: Two times a day (BID) | ORAL | 1 refills | Status: DC | PRN
  Filled 2023-09-08: qty 60, 30d supply, fill #0

## 2023-09-08 MED ORDER — LITHIUM CARBONATE 300 MG PO CAPS
900.0000 mg | ORAL_CAPSULE | Freq: Every evening | ORAL | 1 refills | Status: DC
  Filled 2023-09-08: qty 270, 90d supply, fill #0

## 2023-09-08 NOTE — Patient Instructions (Signed)
 Thank you for attending your appointment today.  -- CONSOLIDATE lithium  to 900 mg nightly -- INCREASE Latuda  to 40 mg every evening with supper -- Continue other medications as prescribed.  Please do not make any changes to medications without first discussing with your provider. If you are experiencing a psychiatric emergency, please call 911 or present to your nearest emergency department. Additional crisis, medication management, and therapy resources are included below.  [facility]  [address], [place], [place] [address] [phone] WALK-IN URGENT CARE 24/7 FOR ANYONE [address], [place], [place]  [phone] Fax: [phone] [email or web address] *Interpreters available *Accepts all insurance and uninsured for Urgent Care needs *Accepts Medicaid and uninsured for outpatient treatment (below)      ONLY FOR [facility]  Below:    Outpatient New Patient Assessment/Therapy Walk-ins:        Monday, Wednesday, and Thursday 8am until slots are full (first come, first served)                   New Patient Psychiatry/Medication Management        Monday-Friday 8am-11am (first come, first served)               For all walk-ins we ask that you arrive by 7:15am, because patients will be seen in the order of arrival.

## 2023-09-13 ENCOUNTER — Other Ambulatory Visit: Payer: Self-pay

## 2023-09-13 ENCOUNTER — Ambulatory Visit: Admitting: Podiatry

## 2023-09-22 ENCOUNTER — Ambulatory Visit (INDEPENDENT_AMBULATORY_CARE_PROVIDER_SITE_OTHER): Admitting: Podiatry

## 2023-09-22 ENCOUNTER — Encounter: Payer: Self-pay | Admitting: Podiatry

## 2023-09-22 DIAGNOSIS — M79671 Pain in right foot: Secondary | ICD-10-CM

## 2023-09-22 DIAGNOSIS — B351 Tinea unguium: Secondary | ICD-10-CM | POA: Diagnosis not present

## 2023-09-22 NOTE — Progress Notes (Signed)
   Subjective:    HPI Presents for follow-up onychomycosis treatment with p.o. Lamisil.  No problems taking medicine with no side effects noted.  Tenderness around toes with walking and wearing shoes.   Objective:  Physical Exam   General: AAO x3, NAD  Vascular: DP and PT pulses palpable bilaterally.  Immedate capillary fill time digits. No significant lower extremity edema bilaterally.  Dermatological: Onychomycotic mycotic changes nails 1 through 5 with discoloration nail and subungual debris and thickening of the nail. 0% Clearance of onychomycotic nail changes noted. Tenderness with walking and wearing shoes.  Neruologic: Grossly intact B/L  Musculoskeletal: +   Assessment:  Painful onychomycotic nails 1 through 5 bilaterally. Pain feet b/l     Plan:  -Established office visit level 3 for evaluation and management -Patient is tolerating Lamisil treatment for onychomycotic nails well.  No side effects noted. Will continue this treatment. -Rx: Lamisil 250 mg p.o. daily - Labs ordered today for liver function tests to monitor for any hepatic side effects from the Lamisil.  - Return in 4 weeks for  Lamisil 3

## 2023-09-23 LAB — HEPATIC FUNCTION PANEL
AG Ratio: 2.1 (calc) (ref 1.0–2.5)
ALT: 21 U/L (ref 6–29)
AST: 15 U/L (ref 10–35)
Albumin: 4.5 g/dL (ref 3.6–5.1)
Alkaline phosphatase (APISO): 73 U/L (ref 31–125)
Bilirubin, Direct: 0.1 mg/dL (ref 0.0–0.2)
Globulin: 2.1 g/dL (ref 1.9–3.7)
Indirect Bilirubin: 0.1 mg/dL — ABNORMAL LOW (ref 0.2–1.2)
Total Bilirubin: 0.2 mg/dL (ref 0.2–1.2)
Total Protein: 6.6 g/dL (ref 6.1–8.1)

## 2023-09-29 ENCOUNTER — Ambulatory Visit: Payer: Self-pay

## 2023-09-29 ENCOUNTER — Other Ambulatory Visit: Payer: Self-pay

## 2023-09-29 DIAGNOSIS — L400 Psoriasis vulgaris: Secondary | ICD-10-CM | POA: Diagnosis not present

## 2023-09-29 MED ORDER — CLOBETASOL PROPIONATE 0.05 % EX SOLN
CUTANEOUS | 0 refills | Status: AC
  Filled 2023-09-29: qty 50, 30d supply, fill #0

## 2023-09-29 MED ORDER — CLOBETASOL PROPIONATE 0.05 % EX CREA
TOPICAL_CREAM | CUTANEOUS | 0 refills | Status: DC
  Filled 2023-09-29: qty 60, 30d supply, fill #0

## 2023-10-03 ENCOUNTER — Other Ambulatory Visit: Payer: Self-pay

## 2023-10-04 ENCOUNTER — Other Ambulatory Visit: Payer: Self-pay

## 2023-10-06 ENCOUNTER — Other Ambulatory Visit: Payer: Self-pay

## 2023-10-07 ENCOUNTER — Ambulatory Visit (HOSPITAL_COMMUNITY): Admitting: Mental Health

## 2023-10-07 DIAGNOSIS — F319 Bipolar disorder, unspecified: Secondary | ICD-10-CM

## 2023-10-07 DIAGNOSIS — F411 Generalized anxiety disorder: Secondary | ICD-10-CM

## 2023-10-07 NOTE — Progress Notes (Signed)
   THERAPIST PROGRESS NOTE Virtual Visit via Video Note  I connected with Valerie Reilly on 10/07/23 at 11:00 AM EDT by a video enabled telemedicine application and verified that I am speaking with the correct person using two identifiers.  Location: Patient: home address on file Provider: home office   I discussed the limitations of evaluation and management by telemedicine and the availability of in person appointments. The patient expressed understanding and agreed to proceed.     I discussed the assessment and treatment plan with the patient. The patient was provided an opportunity to ask questions and all were answered. The patient agreed with the plan and demonstrated an understanding of the instructions.   The patient was advised to call back or seek an in-person evaluation if the symptoms worsen or if the condition fails to improve as anticipated.  I provided 50 minutes of non-face-to-face time during this encounter.   Ty Bernice Savant, [facility]   Session Time: 11:06 am (   Participation Level: Active  Behavioral Response: CasualAlertWNL  Type of Therapy: Individual Therapy  Treatment Goals addressed: STG: Deal better. Valerie Reilly will increase management of stress AEB ability to process events in balanced way with engagement in at least x 1 coping skills for stress as needed with the next 90 days.   ProgressTowards Goals: Progressing  Interventions: CBT and Supportive  Summary: Valerie Reilly is a 48 y.o. female who presents with dx of bipolar disorder, borderline personality disorder and generalized anxiety. Presents to session alert and oriented; mood and affect stable. Shares for moods to been better and to have stabilized with increase in medications. Notes concerned for stability to not last so taking things day by day. Shares thoughts on hx of manic sxs and presence of significant mania sxs in the past in which has lead to hospitalization. Explores with  therapist actions for safety she takes in presence of manic sxs and has attempted to educate her fiance on the signs and sxs of her mania to become worse. 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 Shares has also been engaging in water aerobics and shares to really enjoy this. Denies safety concerns. Ongoing work towards goals.   Suicidal/Homicidal: Nowithout intent/plan  Therapist Response: Therapist engaged Valerie Reilly in [employer]. Completed check in and assessed for current level of functioning, sxs management and stressors. Provided safe space for Valerie Reilly to share thoughts and feelings in regard to concern for ability to maintain stability in moods. Supported in processing hx of severe manic episodes and explored presence of safety plan for manic sxs. Reviewed signs and sxs of manic episodes. 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 Explored ability to manage stressors aand affirmed ability to engage in water aerobics. Reviewed session and provided follow up.   Plan: Return again in  x 3 weeks.  Diagnosis: Bipolar I disorder (HCC)  GAD (generalized anxiety disorder)  Collaboration of Care: Other None  Patient/Guardian was advised Release of Information must be obtained prior to any record release in order to collaborate their care with an outside provider. Patient/Guardian was advised if they have not already done so to contact the registration department to sign all necessary forms in order for us  to release information regarding their care.   Consent: Patient/Guardian gives verbal consent for treatment and assignment of benefits for services provided during this visit. Patient/Guardian expressed understanding and agreed to proceed.   Ty Bernice Potrero, [facility] 10/07/2023

## 2023-10-11 ENCOUNTER — Other Ambulatory Visit: Payer: Self-pay

## 2023-10-12 ENCOUNTER — Other Ambulatory Visit: Payer: Self-pay

## 2023-10-20 ENCOUNTER — Ambulatory Visit: Admitting: Podiatry

## 2023-10-20 ENCOUNTER — Ambulatory Visit: Payer: Self-pay

## 2023-10-21 ENCOUNTER — Ambulatory Visit (INDEPENDENT_AMBULATORY_CARE_PROVIDER_SITE_OTHER): Admitting: Mental Health

## 2023-10-21 DIAGNOSIS — F319 Bipolar disorder, unspecified: Secondary | ICD-10-CM | POA: Diagnosis not present

## 2023-10-21 DIAGNOSIS — F411 Generalized anxiety disorder: Secondary | ICD-10-CM

## 2023-10-21 NOTE — Progress Notes (Signed)
   THERAPIST PROGRESS NOTE Virtual Visit via Video Note  I connected with Valerie Reilly on 10/21/23 at 11:00 AM EDT by a video enabled telemedicine application and verified that I am speaking with the correct person using two identifiers.  Location: Patient: home address on file Provider: home office   I discussed the limitations of evaluation and management by telemedicine and the availability of in person appointments. The patient expressed understanding and agreed to proceed.  I discussed the assessment and treatment plan with the patient. The patient was provided an opportunity to ask questions and all were answered. The patient agreed with the plan and demonstrated an understanding of the instructions.   The patient was advised to call back or seek an in-person evaluation if the symptoms worsen or if the condition fails to improve as anticipated.  I provided 48 minutes of non-face-to-face time during this encounter.   Ty Bernice Savant, [facility]   Session Time: 11:00 am   Participation Level: Active  Behavioral Response: CasualAlertWNL  Type of Therapy: Individual Therapy  Treatment Goals addressed: STG: Deal better. Valerie Reilly will increase management of stress AEB ability to process events in balanced way with engagement in at least x 1 coping skills for stress as needed with the next 90 days.   ProgressTowards Goals: Progressing  Interventions: CBT and Supportive  Summary:Valerie Reilly is a 48 y.o. female who presents with dx of bipolar disorder, borderline personality disorder and generalized anxiety. Presents to session alert and oriented; mood and affect stable. 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 Shares with therapist thoughts of working part time, however continues to pursue disability benefits. Notes for moods to have been stable and feeling like myself gain. Shares a day of feeling manicy in which  she was able to get a lot done with some reduced sleep however subsided. Notes desire to attend water aerobics and shares upcoming birthday plans. Shares desire to buy a home without daughter and sell current place as living with her daughter continues to not be ideal. Ongoing progress with goals; denies safety concerns.    Suicidal/Homicidal: Nowithout intent/plan  Therapist Response:  Therapist engaged [employer] in [employer]. Completed check in and assessed for current level of functioning, sxs management and stressors. Provided safe space for Valerie Reilly to share thoughts and feelings with ongoing stability in moods. Reviewed role employment can play in stability in moods and over all level of functioning. Supported in processing concerns with daughter. Reviewed session and provided follow up.  Plan: Return again in  x 2 weeks.  Diagnosis: Bipolar I disorder (HCC)  GAD (generalized anxiety disorder)  Collaboration of Care: Other None  Patient/Guardian was advised Release of Information must be obtained prior to any record release in order to collaborate their care with an outside provider. Patient/Guardian was advised if they have not already done so to contact the registration department to sign all necessary forms in order for us  to release information regarding their care.   Consent: Patient/Guardian gives verbal consent for treatment and assignment of benefits for services provided during this visit. Patient/Guardian expressed understanding and agreed to proceed.   Ty Bernice Sanborn, [facility] 10/21/2023

## 2023-10-25 ENCOUNTER — Other Ambulatory Visit: Payer: Self-pay

## 2023-10-26 ENCOUNTER — Ambulatory Visit: Admitting: Family Medicine

## 2023-10-26 ENCOUNTER — Other Ambulatory Visit: Payer: Self-pay

## 2023-10-26 ENCOUNTER — Encounter: Payer: Self-pay | Admitting: Family Medicine

## 2023-10-26 VITALS — BP 139/84 | HR 78 | Temp 97.7°F | Ht 67.0 in | Wt 237.2 lb

## 2023-10-26 DIAGNOSIS — B309 Viral conjunctivitis, unspecified: Secondary | ICD-10-CM

## 2023-10-26 MED ORDER — OLOPATADINE HCL 0.1 % OP SOLN
1.0000 [drp] | Freq: Two times a day (BID) | OPHTHALMIC | 12 refills | Status: DC
  Filled 2023-10-26: qty 5, 34d supply, fill #0

## 2023-10-26 NOTE — Assessment & Plan Note (Signed)
 Symptoms most consistent with viral conjunctivitis without mucopurulent drainage. Will start Olopatadine  0.1% drop BID to right eye. May use warm or cool compresses and lubricating eye drops. Symptoms may worsen initially but should resolve in 7-10 days. Careful handwashing recommended. Follow up if symptoms persist with PCP.

## 2023-10-26 NOTE — Progress Notes (Signed)
 Subjective:  HPI: Valerie Reilly is a 48 y.o. female presenting on 10/26/2023 for Acute Visit (Pink eye, itching, swollen, redness. Since yesterday  )   HPI Patient is in today for concerns of pink eye. She reports redness, itching, and swelling of her eyelid since yesterday. She does endorse a gritty sensation. She did notice some white discharge this AM under her eye. Ms Wardle is not a contact lens wearer, PMH includes allergies.  Denies fever, viral illness, vision changes, eye pain, mucopurulent drainage, foreign body sensation No known sick exposures. Has tried nothing.   Review of Systems  All other systems reviewed and are negative.   Relevant past medical history reviewed and updated as indicated.   Past Medical History:  Diagnosis Date   Allergy    Anxiety    Asthma    Bipolar disorder (HCC)    Diabetes mellitus    Diabetes mellitus, type II (HCC)    Gallstone    GERD (gastroesophageal reflux disease)    History of borderline personality disorder    Hypercholesteremia    Hypertension    Obesity    Psoriasis    Psoriasis    Seasonal allergies    Thyroid  disease      Past Surgical History:  Procedure Laterality Date   CHOLECYSTECTOMY N/A 10/02/2021   Procedure: LAPAROSCOPIC CHOLECYSTECTOMY;  Surgeon: Evonnie Dorothyann LABOR, DO;  Location: [facility];  Service: General;  Laterality: N/A;   TONSILLECTOMY     TUBAL LIGATION  2 years ago    Allergies and medications reviewed and updated.   Current Outpatient Medications:    albuterol  (VENTOLIN  HFA) 108 (90 Base) MCG/ACT inhaler, Inhale 2 puffs into the lungs every 6 (six) hours as needed for wheezing or shortness of breath., Disp: 6.7 g, Rfl: 2   benazepril  (LOTENSIN ) 20 MG tablet, Take 1 tablet (20 mg total) by mouth daily., Disp: 90 tablet, Rfl: 1   busPIRone  (BUSPAR ) 30 MG tablet, Take 1 tablet (30 mg total) by mouth 2 (two) times daily., Disp: 180 tablet, Rfl: 1   cholecalciferol (VITAMIN D ) 1000 UNITS  tablet, Take 1,000 Units by mouth daily., Disp: , Rfl:    clobetasol  (TEMOVATE ) 0.05 % external solution, Apply 1 (one) mL two times daily to SCALP. after 14 days reduce use to 2 days per week., Disp: 50 mL, Rfl: 0   clobetasol  cream (TEMOVATE ) 0.05 %, Apply topically to affected areas two times daily for up to 2 weeks, then decrease to two days per week., Disp: 60 g, Rfl: 0   famotidine  (PEPCID ) 20 MG tablet, Take 1 tablet (20 mg total) by mouth 2 (two) times daily., Disp: 180 tablet, Rfl: 1   fexofenadine  (ALLEGRA ) 180 MG tablet, Take 1 tablet (180 mg total) by mouth daily., Disp: 90 tablet, Rfl: 1   fluticasone  (FLONASE ) 50 MCG/ACT nasal spray, Place 2 sprays into both nostrils daily., Disp: 16 g, Rfl: 2   gabapentin  (NEURONTIN ) 100 MG capsule, Take 1 capsule (100 mg total) by mouth 2 (two) times daily., Disp: 180 capsule, Rfl: 1   glipiZIDE  (GLUCOTROL  XL) 10 MG 24 hr tablet, Take 1 tablet (10 mg total) by mouth daily with breakfast., Disp: 90 tablet, Rfl: 1   hydroxypropyl methylcellulose / hypromellose (ISOPTO TEARS / GONIOVISC) 2.5 % ophthalmic solution, Place 1 drop into both eyes in the morning and at bedtime., Disp: 15 mL, Rfl: 3   hydrOXYzine  (ATARAX ) 25 MG tablet, Take 0.5-1 tablets (12.5-25 mg total) by mouth 2 (two)  times daily as needed for anxiety., Disp: 90 tablet, Rfl: 1   levothyroxine  (SYNTHROID ) 50 MCG tablet, Take 1 tablet (50 mcg total) by mouth daily before breakfast., Disp: 90 tablet, Rfl: 1   lidocaine  (LIDODERM ) 5 %, Place 1 patch onto the skin daily. Remove & Discard patch within 12 hours daily, to lower back, Disp: 30 patch, Rfl: 0   lithium  carbonate 300 MG capsule, Take 3 capsules (900 mg total) by mouth at bedtime., Disp: 270 capsule, Rfl: 1   lurasidone  (LATUDA ) 40 MG TABS tablet, Take 1 tablet (40 mg total) by mouth daily with supper. Should be taken with food (at least 350 calories)., Disp: 30 tablet, Rfl: 2   metFORMIN  (GLUCOPHAGE ) 1000 MG tablet, Take 1 tablet (1,000  mg total) by mouth 2 (two) times daily with a meal., Disp: 180 tablet, Rfl: 1   Multiple Vitamin (MULTIVITAMIN) capsule, Take 1 capsule by mouth daily., Disp: , Rfl:    olopatadine  (PATANOL) 0.1 % ophthalmic solution, Place 1 drop into the right eye 2 (two) times daily., Disp: 5 mL, Rfl: 12   simvastatin  (ZOCOR ) 40 MG tablet, Take 1 tablet (40 mg total) by mouth daily., Disp: 90 tablet, Rfl: 1   terbinafine  (LAMISIL ) 250 MG tablet, Take 1 tablet (250 mg total) by mouth daily., Disp: 30 tablet, Rfl: 0   traZODone  (DESYREL ) 150 MG tablet, Take 2 tablets (300 mg total) by mouth at bedtime., Disp: 180 tablet, Rfl: 1  No Known Allergies  Objective:   BP 139/84   Pulse 78   Temp 97.7 F (36.5 C)   Ht 5' 7 (1.702 m)   Wt 237 lb 3.2 oz (107.6 kg)   LMP  (Approximate)   SpO2 93%   BMI 37.15 kg/m      10/26/2023   11:08 AM 06/15/2023    8:46 AM 04/05/2023    9:00 AM  Vitals with BMI  Height 5' 7 5' 7 5' 7  Weight 237 lbs 3 oz 225 lbs 223 lbs  BMI 37.14 35.23 34.92  Systolic 139 134 861  Diastolic 84 79 70  Pulse 78 74 69     Physical Exam Vitals and nursing note reviewed.  Constitutional:      Appearance: Normal appearance. She is normal weight.  HENT:     Head: Normocephalic and atraumatic.  Eyes:     General: No visual field deficit.       Right eye: No foreign body, discharge or hordeolum.        Left eye: No foreign body, discharge or hordeolum.     Extraocular Movements:     Right eye: Normal extraocular motion and no nystagmus.     Left eye: Normal extraocular motion and no nystagmus.     Conjunctiva/sclera:     Right eye: Right conjunctiva is injected. No chemosis, exudate or hemorrhage.    Left eye: Left conjunctiva is not injected. No chemosis, exudate or hemorrhage.    Comments: Right lid edema  Skin:    General: Skin is warm and dry.  Neurological:     General: No focal deficit present.     Mental Status: She is alert and oriented to person, place, and time.  Mental status is at baseline.  Psychiatric:        Mood and Affect: Mood normal.        Behavior: Behavior normal.        Thought Content: Thought content normal.        Judgment:  Judgment normal.     Assessment & Plan:  Viral conjunctivitis of right eye Assessment & Plan: Symptoms most consistent with viral conjunctivitis without mucopurulent drainage. Will start Olopatadine  0.1% drop BID to right eye. May use warm or cool compresses and lubricating eye drops. Symptoms may worsen initially but should resolve in 7-10 days. Careful handwashing recommended. Follow up if symptoms persist with PCP.   Other orders -     Olopatadine  HCl; Place 1 drop into the right eye 2 (two) times daily.  Dispense: 5 mL; Refill: 12     Follow up plan: Return if symptoms worsen or fail to improve with PCP.  Jeoffrey GORMAN Barrio, FNP

## 2023-10-28 NOTE — Progress Notes (Signed)
 BH MD Outpatient Progress Note  11/01/2023 2:56 PM Valerie Reilly  MRN:  [id number]  Assessment:  Valerie Reilly presents for follow-up evaluation. Today, 11/01/23, patient reports improvement in mood stability, sleep, and SI associated with increase in Latuda . She notes she has recently obtained employment which has been beneficial to overall sense of productivity and enjoyment as well. She reports adherence to below medication regimen and is tolerating well. Despite improvement in mood, she reports ongoing difficulty with short-term memory. Majority of visit dedicated to exploring cognitive concerns and administering MoCA which was consistent with mild neurocognitive impairment. She does report history of dementia in father at early onset. Given noted cognitive deficits and family history, referral to neurology placed for further evaluation. Counseled on importance of smoking cessation and exercise to mitigate overall risk.   RTC in 6 weeks by video.  Identifying Information: Valerie Reilly is a 49 y.o. female with a history of bipolar 1 disorder, borderline personality disorder, GAD, T2DM, hypothyroidism on Synthroid , HTN, and HLD who is an established patient with [facility] participating in follow-up via video conferencing. On further review of historical bipolar diagnosis, this diagnosis is felt to be accurate as patient reports history of discrete manic episodes requiring hospitalization during periods of non-adherence to Lithium .   Plan:  # Bipolar 1 disorder currently euthymic  GAD # Borderline personality disorder Past medication trials: Effexor, Depakote (brief) Status of problem: improving Interventions: -- Continue lithium  900 mg nightly  -- Continue Latuda  40 mg qEvening with supper (s5/7/25, i7/3/25) -- Continue buspirone  30 mg 2 times daily -- Continue gabapentin  100 mg BID -- Continue Atarax  12.5-25 mg BID PRN anxiety (using infrequently) --  Continue individual psychotherapy with Bernice Rao [facility]  # Mild cognitive impairment Status of problem: acute Interventions: -- MoCA performed today with score of 19/30 consistent with mild NCI -- Referral to neurology placed for further evaluation -- Will continue to taper medications that may be contributing to cognitive dulling as tolerated (trazodone , gabapentin )  # Sleep regulation Past medication trials: unknown Status of problem: stable Interventions: -- Continue trazodone  150 mg at bedtime   # High risk medication use Interventions: -- Lithium :  -- Kidney function within normal limits 04/13/23  -- TSH within normal limits 04/13/23  -- Lithium  level 0.8 06/29/23  Patient was given contact information for behavioral health clinic and was instructed to call 911 for emergencies.   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 Feeling hopeful she will love it but is a big pay cut. Continues to live with boyfriend and daughter; notes ongoing conflicts with daughter.   Feels that she has been feeling better - not sure if this is related to medication changes or getting a job or both. Notes she may start water aerobics. Feels she has been getting out of the house more. Mood overall is better - notes she continues to experience moments of moodiness but describes this as chronic in nature and often triggered by interpersonal conflict.  Denies SI/HI. Sleeping well with about 6-7 hours nightly. Reports she has reduced trazodone  to 150 mg nightly and prefers this dosing to higher dose. Did well with consolidation of lithium . Finds current gabapentin  dose helpful for diabetic neuropathy.   Reports that she continues to have moments in which she feels confused - provides  example of getting confused about what money was  in which financial account. Notes she will enter a room and forget why she entered it. Some moments in which she is driving and will momentarily forget where she is but then quickly remembers - denies having to have others come rescue her. Denies leaving stove on and continues to cook as normal. Reports she must write everything down and keeps extensive lists.   Remainder of visit spent in administering MoCA - results of mild cognitive impairment was reviewed. Reports she noticed issues with memory about a few years ago; others have not mentioned concerns. Reports family history of dementia in father; not sure of type. Believes he was diagnosed in late 50s/early 91s.   Amenable to referral to neurology for further evaluation. Counseled on smoking cessation and importance of exercise to mitigate overall risk.    Visit Diagnosis:    ICD-10-CM   1. Bipolar I disorder (HCC)  F31.9 traZODone  (DESYREL ) 150 MG tablet    lithium  carbonate 300 MG capsule    2. Insomnia due to other mental disorder  F51.05 traZODone  (DESYREL ) 150 MG tablet   F99     3. GAD (generalized anxiety disorder)  F41.1 gabapentin  (NEURONTIN ) 100 MG capsule    busPIRone  (BUSPAR ) 30 MG tablet    4. Mild cognitive impairment  G31.84 Ambulatory referral to Neurology    5. Borderline personality disorder (HCC)  F60.3      Past Psychiatric History:  Diagnoses: Bipolar 1 disorder, borderline personality disorder, cannabis use disorder in remission Medication trials: Effexor, Depakote (brief) Hospitalizations: multiple - last hospitalization at [facility] in 2014 for episode of mania Suicide attempt: yes - many years ago; hx of 2-3 previous attempts  Access to guns: denies Substance use:   -- Etoh: denies  -- Denies use of cannabis or other illicit drugs  -- Tobacco: 2 ppd; precontemplative regarding cessation  Past Medical History:  Past Medical History:  Diagnosis Date   Allergy    Anxiety    Asthma    Bipolar disorder  (HCC)    Diabetes mellitus    Diabetes mellitus, type II (HCC)    Gallstone    GERD (gastroesophageal reflux disease)    History of borderline personality disorder    Hypercholesteremia    Hypertension    Obesity    Psoriasis    Psoriasis    Seasonal allergies    Thyroid  disease     Past Surgical History:  Procedure Laterality Date   CHOLECYSTECTOMY N/A 10/02/2021   Procedure: LAPAROSCOPIC CHOLECYSTECTOMY;  Surgeon: Evonnie Dorothyann LABOR, DO;  Location: [facility];  Service: General;  Laterality: N/A;   TONSILLECTOMY     TUBAL LIGATION  2 years ago    Family Psychiatric History:  Maternal uncle: depression, alcohol use Mother: personality disorder  Family History:  Family History  Problem Relation Age of Onset   Personality disorder Mother    Diabetes Mother    Diabetes Father    Alcohol abuse Maternal Uncle    Depression Maternal Uncle    Diabetes Maternal Grandfather    Breast cancer Neg Hx    Stomach cancer Neg Hx    Colon cancer Neg Hx    Esophageal cancer Neg Hx    Colon polyps Neg Hx    Crohn's disease Neg Hx    Ulcerative colitis Neg Hx    Rectal cancer Neg Hx     Social History:  Social History   Socioeconomic History   Marital status:  Divorced    Spouse name: Not on file   Number of children: 1   Years of education: Not on file   Highest education level: Associate degree: occupational, Scientist, product/process development, or vocational program  Occupational History   Occupation: delivery newspaper  Tobacco Use   Smoking status: Every Day    Current packs/day: 2.00    Average packs/day: 2.0 packs/day for 30.0 years (60.0 ttl pk-yrs)    Types: Cigarettes    Passive exposure: Current   Smokeless tobacco: Never  Vaping Use   Vaping status: Never Used  Substance and Sexual Activity   Alcohol use: Yes    Comment: occasional- a few times a year   Drug use: No    Types: Marijuana    Comment: last time was 3 yrs ago   Sexual activity: Yes    Birth control/protection:  Surgical    Comment: Tubal ligation  Other Topics Concern   Not on file  Social History Narrative   Not on file   Social Drivers of Health   Financial Resource Strain: Low Risk  (12/15/2022)   Overall Financial Resource Strain (CARDIA)    Difficulty of Paying Living Expenses: Not hard at all  Food Insecurity: No Food Insecurity (12/15/2022)   Hunger Vital Sign    Worried About Running Out of Food in the Last Year: Never true    Ran Out of Food in the Last Year: Never true  Transportation Needs: No Transportation Needs (12/15/2022)   PRAPARE - Administrator, Civil Service (Medical): No    Lack of Transportation (Non-Medical): No  Physical Activity: Inactive (12/15/2022)   Exercise Vital Sign    Days of Exercise per Week: 0 days    Minutes of Exercise per Session: 0 min  Stress: No Stress Concern Present (12/15/2022)   [employer] of Occupational Health - Occupational Stress Questionnaire    Feeling of Stress : Not at all  Social Connections: Socially Isolated (12/15/2022)   Social Connection and Isolation Panel    Frequency of Communication with Friends and Family: More than three times a week    Frequency of Social Gatherings with Friends and Family: Once a week    Attends Religious Services: Never    Database administrator or Organizations: No    Attends Engineer, structural: Never    Marital Status: Divorced    Allergies: No Known Allergies  Current Medications: Current Outpatient Medications  Medication Sig Dispense Refill   hydrOXYzine  (ATARAX ) 25 MG tablet Take 0.5-1 tablets (12.5-25 mg total) by mouth 2 (two) times daily as needed for anxiety. 90 tablet 1   albuterol  (VENTOLIN  HFA) 108 (90 Base) MCG/ACT inhaler Inhale 2 puffs into the lungs every 6 (six) hours as needed for wheezing or shortness of breath. 6.7 g 2   benazepril  (LOTENSIN ) 20 MG tablet Take 1 tablet (20 mg total) by mouth daily. 90 tablet 1   busPIRone  (BUSPAR ) 30 MG tablet Take 1  tablet (30 mg total) by mouth 2 (two) times daily. 180 tablet 1   cholecalciferol (VITAMIN D ) 1000 UNITS tablet Take 1,000 Units by mouth daily.     clobetasol  (TEMOVATE ) 0.05 % external solution Apply 1 (one) mL two times daily to SCALP. after 14 days reduce use to 2 days per week. 50 mL 0   clobetasol  cream (TEMOVATE ) 0.05 % Apply 1 (one) Application two times a day, 2 days per week 60 g 0   famotidine  (PEPCID ) 20 MG tablet Take  1 tablet (20 mg total) by mouth 2 (two) times daily. 180 tablet 1   fexofenadine  (ALLEGRA ) 180 MG tablet Take 1 tablet (180 mg total) by mouth daily. 90 tablet 1   fluocinonide  (LIDEX ) 0.05 % external solution Apply 1 Application topically 2 (two) times daily as needed 120 mL 0   fluticasone  (FLONASE ) 50 MCG/ACT nasal spray Place 2 sprays into both nostrils daily. 16 g 2   gabapentin  (NEURONTIN ) 100 MG capsule Take 1 capsule (100 mg total) by mouth 2 (two) times daily. 180 capsule 1   glipiZIDE  (GLUCOTROL  XL) 10 MG 24 hr tablet Take 1 tablet (10 mg total) by mouth daily with breakfast. 90 tablet 1   hydroxypropyl methylcellulose / hypromellose (ISOPTO TEARS / GONIOVISC) 2.5 % ophthalmic solution Place 1 drop into both eyes in the morning and at bedtime. 15 mL 3   levothyroxine  (SYNTHROID ) 50 MCG tablet Take 1 tablet (50 mcg total) by mouth daily before breakfast. 90 tablet 1   lidocaine  (LIDODERM ) 5 % Place 1 patch onto the skin daily. Remove & Discard patch within 12 hours daily, to lower back 30 patch 0   lithium  carbonate 300 MG capsule Take 3 capsules (900 mg total) by mouth at bedtime. 270 capsule 1   lurasidone  (LATUDA ) 40 MG TABS tablet Take 1 tablet (40 mg total) by mouth daily with supper. Should be taken with food (at least 350 calories). 90 tablet 1   metFORMIN  (GLUCOPHAGE ) 1000 MG tablet Take 1 tablet (1,000 mg total) by mouth 2 (two) times daily with a meal. 180 tablet 1   Multiple Vitamin (MULTIVITAMIN) capsule Take 1 capsule by mouth daily.     olopatadine   (PATANOL) 0.1 % ophthalmic solution Place 1 drop into the right eye 2 (two) times daily. 5 mL 12   simvastatin  (ZOCOR ) 40 MG tablet Take 1 tablet (40 mg total) by mouth daily. 90 tablet 1   terbinafine  (LAMISIL ) 250 MG tablet Take 1 tablet (250 mg total) by mouth daily. 30 tablet 0   traZODone  (DESYREL ) 150 MG tablet Take 1 tablet (150 mg total) by mouth at bedtime. 90 tablet 1   No current facility-administered medications for this visit.    ROS: See above  Objective:  Psychiatric Specialty Exam: Last menstrual period 04/08/2022.There is no height or weight on file to calculate BMI.  General Appearance: Casual and Fairly Groomed  Eye Contact:  Good  Speech:  Clear and Coherent and Normal Rate  Volume:  Normal  Mood:  much better  Affect:  Euthymic; calm  Thought Content: Denies AVH; IOR; paranoia   Suicidal Thoughts:  Denies passive/active SI  Homicidal Thoughts:  No  Thought Process:  Goal Directed and Linear  Orientation:  Full (Time, Place, and Person)    Memory:  Grossly oriented  Judgment:  Good  Insight:  Fair  Concentration:  Concentration: Good  Recall:  NA  Fund of Knowledge: Good  Language: Good  Psychomotor Activity:  Normal  Akathisia:  NA  AIMS (if indicated): not done  Assets:  Communication Skills Desire for Improvement Housing Intimacy Leisure Time Social Support Vocational/Educational  ADL's:  Intact  Cognition: Impaired,  Mild. MoCA 19/30 11/01/23.  Sleep:  Good   PE: General: well-appearing; no acute distress  Pulm: no increased work of breathing on room air  Strength & Muscle Tone: within normal limits Neuro: no focal neurological deficits observed  Gait & Station: normal  Metabolic Disorder Labs: Lab Results  Component Value Date   HGBA1C  6.1 (H) 06/15/2023   No results found for: PROLACTIN Lab Results  Component Value Date   CHOL 135 12/15/2022   TRIG 109 12/15/2022   HDL 41 12/15/2022   CHOLHDL 3.3 12/15/2022   LDLCALC 74  12/15/2022   LDLCALC 54 06/10/2022   Lab Results  Component Value Date   TSH 1.630 04/13/2023   TSH 1.200 12/15/2022    Therapeutic Level Labs: Lab Results  Component Value Date   LITHIUM  0.8 06/29/2023   LITHIUM  0.6 04/13/2023   No results found for: VALPROATE No results found for: CBMZ  Screenings:  AUDIT    Flowsheet Row Office Visit from 06/10/2022 in [facility] [place] [facility] [place]  Alcohol Use Disorder Identification Test Final Score (AUDIT) 5   GAD-7    Flowsheet Row Office Visit from 10/26/2023 in [facility] Office Visit from 06/15/2023 in [facility] [place] - A Dept Of [place]. [facility] Office Visit from 04/05/2023 in [facility] Office Visit from 12/15/2022 in [facility] [place] - A Dept Of [place]. [facility] Office Visit from 10/15/2022 in [facility] - A Dept Of [place]. [facility]  Total GAD-7 Score 1 13 11  0 0   PHQ2-9    Flowsheet Row Office Visit from 10/26/2023 in [facility] Office Visit from 06/15/2023 in [facility] [place] - A Dept Of [place]. [facility] Office Visit from 04/05/2023 in [facility] Office Visit from 12/15/2022 in [facility] [place] - A Dept Of [place]. [facility] Office Visit from 10/15/2022 in [facility] - A Dept Of [place]. [facility]  PHQ-2 Total Score 0 6 4 0 0  PHQ-9 Total Score -- 16 9 0 --   Flowsheet Row Video Visit from 11/05/2021 in [facility] 60 from 09/29/2021 in [place] [facility] Video Visit from 06/12/2021 in [facility]  C-SSRS RISK CATEGORY No Risk No Risk Low Risk   MoCA 11/01/23: score of 19/30 (see scanned media  tab)  Collaboration of Care: Collaboration of Care: Medication Management AEB ongoing medication management, Psychiatrist AEB established with this provider, and Referral or follow-up with counselor/therapist AEB established with individual therapist  Patient/Guardian was advised Release of Information must be obtained prior to any record release in order to collaborate their care with an outside provider. Patient/Guardian was advised if they have not already done so to contact the registration department to sign all necessary forms in order for us  to release information regarding their care.   Consent: Patient/Guardian gives verbal consent for treatment and assignment of benefits for services provided during this visit. Patient/Guardian expressed understanding and agreed to proceed.   A total of 60 minutes was spent involved in face to face clinical care, chart review, documentation, brief motivational interviewing, and medication management.   Vonnie Spagnolo A Teyon Odette 11/01/2023, 2:56 PM

## 2023-10-31 ENCOUNTER — Other Ambulatory Visit: Payer: Self-pay

## 2023-10-31 DIAGNOSIS — H04123 Dry eye syndrome of bilateral lacrimal glands: Secondary | ICD-10-CM | POA: Diagnosis not present

## 2023-10-31 DIAGNOSIS — H00011 Hordeolum externum right upper eyelid: Secondary | ICD-10-CM | POA: Diagnosis not present

## 2023-10-31 DIAGNOSIS — L400 Psoriasis vulgaris: Secondary | ICD-10-CM | POA: Diagnosis not present

## 2023-10-31 MED ORDER — FLUOCINONIDE 0.05 % EX SOLN
1.0000 | Freq: Two times a day (BID) | CUTANEOUS | 0 refills | Status: AC
  Filled 2023-10-31: qty 120, 30d supply, fill #0

## 2023-10-31 MED ORDER — CLOBETASOL PROPIONATE 0.05 % EX CREA
TOPICAL_CREAM | CUTANEOUS | 0 refills | Status: AC
  Filled 2023-10-31 – 2024-01-09 (×3): qty 60, 30d supply, fill #0

## 2023-11-01 ENCOUNTER — Ambulatory Visit (INDEPENDENT_AMBULATORY_CARE_PROVIDER_SITE_OTHER): Admitting: Psychiatry

## 2023-11-01 ENCOUNTER — Other Ambulatory Visit: Payer: Self-pay

## 2023-11-01 ENCOUNTER — Encounter (HOSPITAL_COMMUNITY): Payer: Self-pay | Admitting: Psychiatry

## 2023-11-01 DIAGNOSIS — F411 Generalized anxiety disorder: Secondary | ICD-10-CM | POA: Diagnosis not present

## 2023-11-01 DIAGNOSIS — F319 Bipolar disorder, unspecified: Secondary | ICD-10-CM

## 2023-11-01 DIAGNOSIS — F99 Mental disorder, not otherwise specified: Secondary | ICD-10-CM | POA: Diagnosis not present

## 2023-11-01 DIAGNOSIS — F5105 Insomnia due to other mental disorder: Secondary | ICD-10-CM

## 2023-11-01 DIAGNOSIS — F603 Borderline personality disorder: Secondary | ICD-10-CM | POA: Diagnosis not present

## 2023-11-01 DIAGNOSIS — G3184 Mild cognitive impairment, so stated: Secondary | ICD-10-CM | POA: Insufficient documentation

## 2023-11-01 MED ORDER — GABAPENTIN 100 MG PO CAPS
100.0000 mg | ORAL_CAPSULE | Freq: Two times a day (BID) | ORAL | 1 refills | Status: DC
  Filled 2023-11-01 – 2023-11-09 (×2): qty 180, 90d supply, fill #0

## 2023-11-01 MED ORDER — TRAZODONE HCL 150 MG PO TABS
150.0000 mg | ORAL_TABLET | Freq: Every day | ORAL | 1 refills | Status: DC
  Filled 2023-11-01: qty 90, 90d supply, fill #0

## 2023-11-01 MED ORDER — LURASIDONE HCL 40 MG PO TABS
40.0000 mg | ORAL_TABLET | Freq: Every day | ORAL | 1 refills | Status: DC
  Filled 2023-11-01: qty 90, 90d supply, fill #0
  Filled 2023-11-09: qty 30, 30d supply, fill #0
  Filled 2023-12-05 (×2): qty 30, 30d supply, fill #1
  Filled 2024-01-06: qty 30, 30d supply, fill #2

## 2023-11-01 MED ORDER — BUSPIRONE HCL 30 MG PO TABS
30.0000 mg | ORAL_TABLET | Freq: Two times a day (BID) | ORAL | 1 refills | Status: DC
Start: 2023-11-01 — End: 2024-01-26
  Filled 2023-11-01: qty 60, 30d supply, fill #0
  Filled 2023-12-05 (×2): qty 60, 30d supply, fill #1
  Filled 2024-01-02: qty 60, 30d supply, fill #2

## 2023-11-01 MED ORDER — LITHIUM CARBONATE 300 MG PO CAPS
900.0000 mg | ORAL_CAPSULE | Freq: Every evening | ORAL | 1 refills | Status: DC
  Filled 2023-11-01 – 2023-12-29 (×2): qty 270, 90d supply, fill #0

## 2023-11-01 NOTE — Patient Instructions (Addendum)
 Thank you for attending your appointment today.  -- We did not make any medication changes today. Please continue medications as prescribed. -- We have placed a referral to neurology for further workup of your memory changes. If you do not hear from them in the next few weeks, please call [phone].  Please do not make any changes to medications without first discussing with your provider. If you are experiencing a psychiatric emergency, please call 911 or present to your nearest emergency department. Additional crisis, medication management, and therapy resources are included below.  [facility]  [address] [place], [place] [address] [phone] WALK-IN URGENT CARE 24/7 FOR ANYONE [address] [place], [place]  [phone] Fax: [phone] [email or web address] *Interpreters available *Accepts all insurance and uninsured for Urgent Care needs *Accepts Medicaid and uninsured for outpatient treatment (below)      ONLY FOR [facility]  Below:    Outpatient New Patient Assessment/Therapy Walk-ins:        Monday, Wednesday, and Thursday 8am until slots are full (first come, first served)                   New Patient Psychiatry/Medication Management        Monday-Friday 8am-11am (first come, first served)               For all walk-ins we ask that you arrive by 7:15am, because patients will be seen in the order of arrival.

## 2023-11-02 ENCOUNTER — Other Ambulatory Visit: Payer: Self-pay

## 2023-11-03 ENCOUNTER — Other Ambulatory Visit: Payer: Self-pay

## 2023-11-03 ENCOUNTER — Telehealth (HOSPITAL_COMMUNITY): Payer: Self-pay

## 2023-11-03 NOTE — Telephone Encounter (Signed)
 Opened chart in error.    JNL

## 2023-11-04 ENCOUNTER — Other Ambulatory Visit: Payer: Self-pay

## 2023-11-04 ENCOUNTER — Ambulatory Visit (HOSPITAL_COMMUNITY): Admitting: Mental Health

## 2023-11-04 DIAGNOSIS — F319 Bipolar disorder, unspecified: Secondary | ICD-10-CM

## 2023-11-04 NOTE — Progress Notes (Signed)
   THERAPIST PROGRESS NOTE Virtual Visit via Video Note  I connected with Lauria S Cronk on 11/04/23 at 11:00 AM EDT by a video enabled telemedicine application and verified that I am speaking with the correct person using two identifiers.  Location: Patient: home address on file Provider: remote office   I discussed the limitations of evaluation and management by telemedicine and the availability of in person appointments. The patient expressed understanding and agreed to proceed.  I discussed the assessment and treatment plan with the patient. The patient was provided an opportunity to ask questions and all were answered. The patient agreed with the plan and demonstrated an understanding of the instructions.   The patient was advised to call back or seek an in-person evaluation if the symptoms worsen or if the condition fails to improve as anticipated.  I provided 43 minutes of non-face-to-face time during this encounter.   Ty Bernice Savant, [facility]   Session Time: 11:03 am (   Participation Level: Active  Behavioral Response: CasualAlertneutral  Type of Therapy: Individual Therapy  Treatment Goals addressed: STG: Deal better. [place] will increase management of stress AEB ability to process events in balanced way with engagement in at least x 1 coping skills for stress as needed with the next 90 days.   ProgressTowards Goals: Progressing  Interventions: CBT and Supportive  Summary: [place] S Canny is a 48 y.o. female who presents with dx of bipolar disorder, borderline personality disorder and generalized anxiety. Presents to session alert and oriented; mood and affect stable. Shares for moods to been better and shares overall stability. Shares with therapist events of her birthday and presentation to dinner. Notes dur to start working part time position on weekends and continues to pursue disability. Shares desire to eventually be able to live independent from  daughter however has been able to get along better. Shares chief complaint of concern for ability to get 90 days supply of Buspar  and psoriasis flare causing discomfort. Shares upcoming plans over the weekend.   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 Completed check in and assessed for current level of functioning, sxs management and stressors. Provided safe space for to share concerns and provided supportive feedback. Explored factors contributing to stability in moods and processed work and community engagement and interactions with others to be helpful for mental health stability. Reviewed session and provided follow up.   Plan: Return again in  x 4 weeks.  Diagnosis: Bipolar I disorder (HCC)  Collaboration of Care: Other None  Patient/Guardian was advised Release of Information must be obtained prior to any record release in order to collaborate their care with an outside provider. Patient/Guardian was advised if they have not already done so to contact the registration department to sign all necessary forms in order for us  to release information regarding their care.   Consent: Patient/Guardian gives verbal consent for treatment and assignment of benefits for services provided during this visit. Patient/Guardian expressed understanding and agreed to proceed.   Ty Bernice Woodworth, [facility] 11/04/2023

## 2023-11-09 ENCOUNTER — Other Ambulatory Visit: Payer: Self-pay

## 2023-11-16 ENCOUNTER — Other Ambulatory Visit: Payer: Self-pay

## 2023-11-17 ENCOUNTER — Other Ambulatory Visit: Payer: Self-pay

## 2023-11-21 ENCOUNTER — Other Ambulatory Visit: Payer: Self-pay

## 2023-11-21 ENCOUNTER — Other Ambulatory Visit (HOSPITAL_COMMUNITY)
Admission: RE | Admit: 2023-11-21 | Discharge: 2023-11-21 | Disposition: A | Discharge status: Home / Self Care | Source: Ambulatory Visit | Attending: Nurse Practitioner | Admitting: Nurse Practitioner

## 2023-11-21 ENCOUNTER — Ambulatory Visit: Attending: Nurse Practitioner | Admitting: Nurse Practitioner

## 2023-11-21 ENCOUNTER — Encounter: Payer: Self-pay | Admitting: Nurse Practitioner

## 2023-11-21 VITALS — BP 128/84 | HR 67 | Resp 19 | Ht 67.0 in | Wt 238.6 lb

## 2023-11-21 DIAGNOSIS — R6889 Other general symptoms and signs: Secondary | ICD-10-CM

## 2023-11-21 DIAGNOSIS — F17200 Nicotine dependence, unspecified, uncomplicated: Secondary | ICD-10-CM | POA: Diagnosis not present

## 2023-11-21 DIAGNOSIS — B370 Candidal stomatitis: Secondary | ICD-10-CM

## 2023-11-21 DIAGNOSIS — Z7251 High risk heterosexual behavior: Secondary | ICD-10-CM | POA: Insufficient documentation

## 2023-11-21 MED ORDER — FLUCONAZOLE 150 MG PO TABS
150.0000 mg | ORAL_TABLET | ORAL | 0 refills | Status: DC
  Filled 2023-11-21: qty 3, 9d supply, fill #0

## 2023-11-21 NOTE — Progress Notes (Signed)
 Assessment & Plan:  Valerie Reilly was seen today for mouth lesions.  Diagnoses and all orders for this visit:  High risk heterosexual behavior -     Cervicovaginal ancillary only -     HIV Antibody (routine testing w rflx) -     RPR  Oral thrush -     fluconazole  (DIFLUCAN ) 150 MG tablet; Take 1 tablet (150 mg total) by mouth every 3 (three) days. Oral lesions: non-specific, not consistent with herpes simplex - Prescribe Diflucan  (fluconazole ) to see if symptoms resolve.  Alteration of body temperature -     CBC with Differential -     Iron, TIBC and Ferritin Panel  Tobacco dependence -     CT CHEST LUNG CA SCREEN LOW DOSE W/O CM; Future  Alteration of body temperature Discussed anemia as a possible cause despite postmenopausal status. Recent thyroid  function tests normal. - Order CBC and iron panel to evaluate for anemia.  Menopausal symptoms: vaginal dryness and decreased libido Discussed variability of menopausal symptoms and treatment options. She prefers not to pursue medication for decreased libido at this time. - Encourage discussion with gynecologist regarding menopausal symptoms and potential treatments during upcoming appointment.  Patient has been counseled on age-appropriate routine health concerns for screening and prevention. These are reviewed and up-to-date. Referrals have been placed accordingly. Immunizations are up-to-date or declined.    Subjective:   Chief Complaint  Patient presents with   Mouth Lesions    Sores on corners of mouth.     History of Present Illness Valerie Reilly is a 48 year old female who presents with lesions on the corner of her mouth and concerns about frequent coldness.  She has had lesions on the corner of her mouth for at least a month. The lesions are not painful but are described as 'kind of sore'. She has had similar lesions before but is unsure if they were the same but believes she had yeast or thrush. She has a history of  cold sores but states these do not appear to be the same.She is requesting STD testing  She experiences frequent coldness, feeling cold at home and not just in the office. She denies having menstrual cycles and is concerned about the possibility of anemia, as she has not had her iron levels checked recently.    She discusses her experience with chronic bacterial vaginosis  and yeast infections, noting that she often experiences BV after having unprotected sex with her longterm partner. She uses condoms consistently and does not use vaginal washes that could alter her pH. She inquires about using boric acid for bacterial infections and is familiar with Diflucan  for yeast infections. She is experiencing menopausal symptoms, including vaginal dryness and a decreased sex drive. Her sex drive has decreased significantly over the past year. She has an upcoming appointment with her gynecologist.   Review of Systems  Constitutional:  Negative for fever, malaise/fatigue and weight loss.       SEE HPI  HENT: Negative.  Negative for nosebleeds.   Eyes:  Positive for pain. Negative for blurred vision, double vision and photophobia.  Respiratory: Negative.  Negative for cough and shortness of breath.   Cardiovascular: Negative.  Negative for chest pain, palpitations and leg swelling.  Gastrointestinal: Negative.  Negative for heartburn, nausea and vomiting.  Musculoskeletal: Negative.  Negative for myalgias.  Skin:  Positive for rash.  Neurological: Negative.  Negative for dizziness, focal weakness, seizures and headaches.  Psychiatric/Behavioral: Negative.  Negative for  suicidal ideas.     Past Medical History:  Diagnosis Date   Allergy    Anxiety    Asthma    Bipolar disorder (HCC)    Diabetes mellitus    Diabetes mellitus, type II (HCC)    Gallstone    GERD (gastroesophageal reflux disease)    History of borderline personality disorder    Hypercholesteremia    Hypertension    Obesity     Psoriasis    Psoriasis    Seasonal allergies    Thyroid  disease     Past Surgical History:  Procedure Laterality Date   CHOLECYSTECTOMY N/A 10/02/2021   Procedure: LAPAROSCOPIC CHOLECYSTECTOMY;  Surgeon: Evonnie Dorothyann LABOR, DO;  Location: [facility];  Service: General;  Laterality: N/A;   TONSILLECTOMY     TUBAL LIGATION  2 years ago    Family History  Problem Relation Age of Onset   Personality disorder Mother    Diabetes Mother    Diabetes Father    Alcohol abuse Maternal Uncle    Depression Maternal Uncle    Diabetes Maternal Grandfather    Breast cancer Neg Hx    Stomach cancer Neg Hx    Colon cancer Neg Hx    Esophageal cancer Neg Hx    Colon polyps Neg Hx    Crohn's disease Neg Hx    Ulcerative colitis Neg Hx    Rectal cancer Neg Hx     Social History Reviewed with no changes to be made today.   Outpatient Medications Prior to Visit  Medication Sig Dispense Refill   albuterol  (VENTOLIN  HFA) 108 (90 Base) MCG/ACT inhaler Inhale 2 puffs into the lungs every 6 (six) hours as needed for wheezing or shortness of breath. 6.7 g 2   benazepril  (LOTENSIN ) 20 MG tablet Take 1 tablet (20 mg total) by mouth daily. 90 tablet 1   busPIRone  (BUSPAR ) 30 MG tablet Take 1 tablet (30 mg total) by mouth 2 (two) times daily. 180 tablet 1   cholecalciferol (VITAMIN D ) 1000 UNITS tablet Take 1,000 Units by mouth daily.     clobetasol  (TEMOVATE ) 0.05 % external solution Apply 1 (one) mL two times daily to SCALP. after 14 days reduce use to 2 days per week. 50 mL 0   clobetasol  cream (TEMOVATE ) 0.05 % Apply 1 (one) Application two times a day, 2 days per week 60 g 0   famotidine  (PEPCID ) 20 MG tablet Take 1 tablet (20 mg total) by mouth 2 (two) times daily. 180 tablet 1   fexofenadine  (ALLEGRA ) 180 MG tablet Take 1 tablet (180 mg total) by mouth daily. 90 tablet 1   fluocinonide  (LIDEX ) 0.05 % external solution Apply 1 Application topically 2 (two) times daily as needed 120 mL 0    fluticasone  (FLONASE ) 50 MCG/ACT nasal spray Place 2 sprays into both nostrils daily. 16 g 2   gabapentin  (NEURONTIN ) 100 MG capsule Take 1 capsule (100 mg total) by mouth 2 (two) times daily. 180 capsule 1   glipiZIDE  (GLUCOTROL  XL) 10 MG 24 hr tablet Take 1 tablet (10 mg total) by mouth daily with breakfast. 90 tablet 1   hydroxypropyl methylcellulose / hypromellose (ISOPTO TEARS / GONIOVISC) 2.5 % ophthalmic solution Place 1 drop into both eyes in the morning and at bedtime. 15 mL 3   hydrOXYzine  (ATARAX ) 25 MG tablet Take 0.5-1 tablets (12.5-25 mg total) by mouth 2 (two) times daily as needed for anxiety. 90 tablet 1   levothyroxine  (SYNTHROID ) 50 MCG tablet Take 1  tablet (50 mcg total) by mouth daily before breakfast. 90 tablet 1   lidocaine  (LIDODERM ) 5 % Place 1 patch onto the skin daily. Remove & Discard patch within 12 hours daily, to lower back 30 patch 0   lithium  carbonate 300 MG capsule Take 3 capsules (900 mg total) by mouth at bedtime. 270 capsule 1   lurasidone  (LATUDA ) 40 MG TABS tablet Take 1 tablet (40 mg total) by mouth daily with supper. Should be taken with food (at least 350 calories). 90 tablet 1   metFORMIN  (GLUCOPHAGE ) 1000 MG tablet Take 1 tablet (1,000 mg total) by mouth 2 (two) times daily with a meal. 180 tablet 1   Multiple Vitamin (MULTIVITAMIN) capsule Take 1 capsule by mouth daily.     simvastatin  (ZOCOR ) 40 MG tablet Take 1 tablet (40 mg total) by mouth daily. 90 tablet 1   traZODone  (DESYREL ) 150 MG tablet Take 1 tablet (150 mg total) by mouth at bedtime. 90 tablet 1   olopatadine  (PATANOL) 0.1 % ophthalmic solution Place 1 drop into the right eye 2 (two) times daily. (Patient not taking: Reported on 11/21/2023) 5 mL 12   terbinafine  (LAMISIL ) 250 MG tablet Take 1 tablet (250 mg total) by mouth daily. (Patient not taking: Reported on 11/21/2023) 30 tablet 0   No facility-administered medications prior to visit.    No Known Allergies     Objective:    BP  128/84 (BP Location: Left Arm, Patient Position: Sitting, Cuff Size: Normal)   Pulse 67   Resp 19   Ht 5' 7 (1.702 m)   Wt 238 lb 9.6 oz (108.2 kg)   LMP 04/08/2022 (Approximate)   SpO2 98%   BMI 37.37 kg/m  Wt Readings from Last 3 Encounters:  11/21/23 238 lb 9.6 oz (108.2 kg)  10/26/23 237 lb 3.2 oz (107.6 kg)  06/15/23 225 lb (102.1 kg)    Physical Exam Vitals and nursing note reviewed.  Constitutional:      Appearance: She is well-developed.  HENT:     Head: Normocephalic and atraumatic.     Mouth/Throat:     Comments: Thick white buildup on inner corners of both lips Cardiovascular:     Rate and Rhythm: Normal rate and regular rhythm.     Heart sounds: Normal heart sounds. No murmur heard.    No friction rub. No gallop.  Pulmonary:     Effort: Pulmonary effort is normal. No tachypnea or respiratory distress.     Breath sounds: Normal breath sounds. No decreased breath sounds, wheezing, rhonchi or rales.  Chest:     Chest wall: No tenderness.  Abdominal:     General: Bowel sounds are normal.     Palpations: Abdomen is soft.  Genitourinary:    Comments: SELF SWAB Musculoskeletal:        General: Normal range of motion.     Cervical back: Normal range of motion.  Skin:    General: Skin is warm and dry.  Neurological:     Mental Status: She is alert and oriented to person, place, and time.     Coordination: Coordination normal.  Psychiatric:        Behavior: Behavior normal. Behavior is cooperative.        Thought Content: Thought content normal.        Judgment: Judgment normal.          Patient has been counseled extensively about nutrition and exercise as well as the importance of adherence with medications and  regular follow-up. The patient was given clear instructions to go to ER or return to medical center if symptoms don't improve, worsen or new problems develop. The patient verbalized understanding.   Follow-up: Return if symptoms worsen or fail to  improve.   Haze LELON Servant, FNP-BC [facility] [place], [place] [phone]   11/21/2023, 2:31 PM

## 2023-11-22 LAB — CBC WITH DIFFERENTIAL/PLATELET
Basophils Absolute: 0.1 x10E3/uL (ref 0.0–0.2)
Basos: 1 %
EOS (ABSOLUTE): 0.7 x10E3/uL — ABNORMAL HIGH (ref 0.0–0.4)
Eos: 5 %
Hematocrit: 43.9 % (ref 34.0–46.6)
Hemoglobin: 14.4 g/dL (ref 11.1–15.9)
Immature Grans (Abs): 0.1 x10E3/uL (ref 0.0–0.1)
Immature Granulocytes: 1 %
Lymphocytes Absolute: 3 x10E3/uL (ref 0.7–3.1)
Lymphs: 23 %
MCH: 32.1 pg (ref 26.6–33.0)
MCHC: 32.8 g/dL (ref 31.5–35.7)
MCV: 98 fL — ABNORMAL HIGH (ref 79–97)
Monocytes Absolute: 0.8 x10E3/uL (ref 0.1–0.9)
Monocytes: 6 %
Neutrophils Absolute: 8.5 x10E3/uL — ABNORMAL HIGH (ref 1.4–7.0)
Neutrophils: 64 %
Platelets: 330 x10E3/uL (ref 150–450)
RBC: 4.49 x10E6/uL (ref 3.77–5.28)
RDW: 12.8 % (ref 11.7–15.4)
WBC: 13.3 x10E3/uL — ABNORMAL HIGH (ref 3.4–10.8)

## 2023-11-22 LAB — IRON,TIBC AND FERRITIN PANEL
Ferritin: 152 ng/mL — ABNORMAL HIGH (ref 15–150)
Iron Saturation: 14 % — ABNORMAL LOW (ref 15–55)
Iron: 47 ug/dL (ref 27–159)
Total Iron Binding Capacity: 345 ug/dL (ref 250–450)
UIBC: 298 ug/dL (ref 131–425)

## 2023-11-22 LAB — RPR: RPR Ser Ql: NONREACTIVE

## 2023-11-22 LAB — HIV ANTIBODY (ROUTINE TESTING W REFLEX): HIV Screen 4th Generation wRfx: NONREACTIVE

## 2023-11-23 ENCOUNTER — Ambulatory Visit: Payer: Self-pay | Admitting: Nurse Practitioner

## 2023-11-23 ENCOUNTER — Encounter: Payer: Self-pay | Admitting: Nurse Practitioner

## 2023-11-23 DIAGNOSIS — B3731 Acute candidiasis of vulva and vagina: Secondary | ICD-10-CM

## 2023-11-23 DIAGNOSIS — B9689 Other specified bacterial agents as the cause of diseases classified elsewhere: Secondary | ICD-10-CM

## 2023-11-23 LAB — CERVICOVAGINAL ANCILLARY ONLY
Bacterial Vaginitis (gardnerella): POSITIVE — AB
Candida Glabrata: NEGATIVE
Candida Vaginitis: POSITIVE — AB
Chlamydia: NEGATIVE
Comment: NEGATIVE
Comment: NEGATIVE
Comment: NEGATIVE
Comment: NEGATIVE
Comment: NEGATIVE
Comment: NORMAL
Neisseria Gonorrhea: NEGATIVE
Trichomonas: NEGATIVE

## 2023-11-23 MED ORDER — FLUCONAZOLE 150 MG PO TABS
150.0000 mg | ORAL_TABLET | Freq: Once | ORAL | 0 refills | Status: AC
  Filled 2023-11-23 – 2024-02-27 (×2): qty 1, 1d supply, fill #0

## 2023-11-23 MED ORDER — METRONIDAZOLE 500 MG PO TABS
500.0000 mg | ORAL_TABLET | Freq: Two times a day (BID) | ORAL | 0 refills | Status: AC
  Filled 2023-11-23: qty 14, 7d supply, fill #0

## 2023-11-24 ENCOUNTER — Other Ambulatory Visit: Payer: Self-pay

## 2023-11-25 ENCOUNTER — Telehealth: Payer: Self-pay

## 2023-11-25 DIAGNOSIS — E1165 Type 2 diabetes mellitus with hyperglycemia: Secondary | ICD-10-CM

## 2023-11-25 DIAGNOSIS — I10 Essential (primary) hypertension: Secondary | ICD-10-CM

## 2023-11-28 ENCOUNTER — Telehealth: Payer: Self-pay | Admitting: *Deleted

## 2023-11-28 ENCOUNTER — Ambulatory Visit (INDEPENDENT_AMBULATORY_CARE_PROVIDER_SITE_OTHER): Admitting: Mental Health

## 2023-11-28 DIAGNOSIS — F319 Bipolar disorder, unspecified: Secondary | ICD-10-CM | POA: Diagnosis not present

## 2023-11-28 NOTE — Progress Notes (Signed)
 Complex Care Management Note Care Guide Note  11/28/2023 Name: Valerie Reilly MRN: [id number] DOB: [birth year]   Complex Care Management Outreach Attempts: An unsuccessful telephone outreach was attempted today to offer the patient information about available complex care management services.  Follow Up Plan:  Additional outreach attempts will be made to offer the patient complex care management information and services.   Encounter Outcome:  No Answer  [phone]

## 2023-11-28 NOTE — Progress Notes (Signed)
 Complex Care Management Note  Care Guide Note 11/28/2023 Name: Valerie Reilly MRN: [id number] DOB: 13-May-1975  Valerie Reilly is a 48 y.o. year old female who sees Theotis Haze ORN, NP for primary care. I reached out to Tationna S Robles by phone today to offer complex care management services.  Ms. Plasse was given information about Complex Care Management services today including:   The Complex Care Management services include support from the care team which includes your Nurse Care Manager, Clinical Social Worker, or Pharmacist.  The Complex Care Management team is here to help remove barriers to the health concerns and goals most important to you. Complex Care Management services are voluntary, and the patient may decline or stop services at any time by request to their care team member.   Complex Care Management Consent Status: Patient agreed to services and verbal consent obtained.   Follow up plan:  Telephone appointment with complex care management team member scheduled for:  12/05/23  Encounter Outcome:  Patient Scheduled  Harlene Satterfield  [facility]  [facility], [facility] Guide  Direct Dial: [phone]  Fax [phone]

## 2023-11-28 NOTE — Progress Notes (Unsigned)
   THERAPIST PROGRESS NOTE Virtual Visit via Video Note  I connected with Valerie Reilly on 11/28/23 at  1:00 PM EDT by a video enabled telemedicine application and verified that I am speaking with the correct person using two identifiers.  Location: Patient: home address on file Provider: remote office   I discussed the limitations of evaluation and management by telemedicine and the availability of in person appointments. The patient expressed understanding and agreed to proceed.  I discussed the assessment and treatment plan with the patient. The patient was provided an opportunity to ask questions and all were answered. The patient agreed with the plan and demonstrated an understanding of the instructions.   The patient was advised to call back or seek an in-person evaluation if the symptoms worsen or if the condition fails to improve as anticipated.  I provided 47 minutes of non-face-to-face time during this encounter.   Ty Bernice Savant, [facility]   Session Time: 1:30 pm  Participation Level: Active  Behavioral Response: CasualAlertWNL  Type of Therapy: Individual Therapy  Treatment Goals addressed: STG: Deal better. Kyah will increase management of stress AEB ability to process events in balanced way with engagement in at least x 1 coping skills for stress as needed with the next 90 days.   ProgressTowards Goals: Progressing  Interventions: Supportive  Summary: Valerie Reilly is a 48 y.o. female who presents with dx of bipolar disorder, borderline personality disorder and generalized anxiety. Presents to session alert and oriented; mood and affect stable. Shares for moods I've been ok. Shares concerns with new employment and thoughts on debating wether or not to present back for position. Shares exploration of going back to full time work from home with desire to separate from daughter. Shares ongoing issues with daughter and denies to feel as if it is best for  her mental health to continue to co-habitate. Able to identify coping mechanisms to support in dealing with difficulty with daughter however ultimate desire to live separately. Denies safety concerns. Ongoing work towards goals.    Suicidal/Homicidal: Nowithout intent/plan  Therapist Response:  Therapist engaged [employer] in [employer]. Completed check in and assessed for current level of functioning, sxs management and stressors. Provided safe space for to share concerns and provided supportive feedback. Supported in processing concerns for employment and desire to return to work from home positions full time. Supported in processing concerns with daughter and darrelyn to navigate and cope with stress of living with daughter. Supported in exploring options for housing and desire to live independently with fiance. Reviewed session and provided follow up   Plan: Return again in x 4 weeks.  Diagnosis: Bipolar I disorder (HCC)  Collaboration of Care: Other None  Patient/Guardian was advised Release of Information must be obtained prior to any record release in order to collaborate their care with an outside provider. Patient/Guardian was advised if they have not already done so to contact the registration department to sign all necessary forms in order for us  to release information regarding their care.   Consent: Patient/Guardian gives verbal consent for treatment and assignment of benefits for services provided during this visit. Patient/Guardian expressed understanding and agreed to proceed.   Ty Bernice Hazelton, [facility] 11/28/2023

## 2023-11-29 ENCOUNTER — Telehealth: Payer: Self-pay

## 2023-11-29 ENCOUNTER — Inpatient Hospital Stay: Admission: RE | Admit: 2023-11-29 | Source: Ambulatory Visit

## 2023-11-29 NOTE — Progress Notes (Signed)
   Telephone encounter was:  Unsuccessful.  11/29/2023 Name: Valerie Reilly MRN: [id number] DOB: 07-09-1975  Unsuccessful outbound call made today to assist with:  Food Insecurity, Financial Difficulties related to Financial strain, and Principal Financial Attempt:  1st Attempt  No answer and unable to leave a message    Jon Colt [facility]  [facility] Guide, Phone: [phone] Fax: [phone] Website: [email or web address]

## 2023-11-30 ENCOUNTER — Telehealth: Payer: Self-pay

## 2023-11-30 NOTE — Progress Notes (Signed)
   Telephone encounter was:  Unsuccessful.  11/30/2023 Name: Valerie Reilly MRN: [id number] DOB: 1975/12/07  Unsuccessful outbound call made today to assist with:  Food Insecurity, Financial Difficulties related to financial strain , and Principal Financial Attempt:  2nd Attempt  No answer and unable to leave a message    Jon Colt [facility]  [facility] Guide, Phone: [phone] Fax: [phone] Website: [email or web address]

## 2023-12-01 ENCOUNTER — Other Ambulatory Visit (HOSPITAL_COMMUNITY): Payer: Self-pay

## 2023-12-01 ENCOUNTER — Telehealth: Payer: Self-pay

## 2023-12-01 ENCOUNTER — Other Ambulatory Visit: Payer: Self-pay

## 2023-12-01 DIAGNOSIS — L400 Psoriasis vulgaris: Secondary | ICD-10-CM | POA: Diagnosis not present

## 2023-12-01 MED ORDER — METHOTREXATE SODIUM 2.5 MG PO TABS
2.5000 mg | ORAL_TABLET | Freq: Once | ORAL | 0 refills | Status: AC
  Filled 2023-12-01: qty 1, 1d supply, fill #0

## 2023-12-01 NOTE — Progress Notes (Signed)
   Telephone encounter was:  Successful.  Complex Care Management Note Care Guide Note  12/01/2023 Name: Valerie Reilly MRN: [id number] DOB: [birth year]  Valerie Reilly is a 48 y.o. year old female who is a primary care patient of Theotis Haze ORN, NP . The community resource team was consulted for assistance with Food Insecurity and Financial Difficulties related to financial strain  SDOH screenings and interventions completed:  Yes  Social Drivers of Health From This Encounter   Food Insecurity: Food Insecurity Present (12/01/2023)   Hunger Vital Sign    Worried About Running Out of Food in the Last Year: Often true    Ran Out of Food in the Last Year: Often true  Financial Resource Strain: High Risk (12/01/2023)   Overall Financial Resource Strain (CARDIA)    Difficulty of Paying Living Expenses: Very hard  Utilities: At Risk (12/01/2023)   Utilities    Threatened with loss of utilities: Yes    SDOH Interventions Today    Flowsheet Row Most Recent Value  SDOH Interventions   Food Insecurity Interventions Community Resources Provided  Utilities Interventions Community Resources Provided  Financial Strain Interventions Community Resources Provided, [employer] Referral     Care guide performed the following interventions: Patient provided with information about care guide support team and interviewed to confirm resource needs.[place] Pt is having financial strain and cant afford food and pay utilities. I was able to give resources over the phone and I will be mailing resources to the Pt   Follow Up Plan:  No further follow up planned at this time. The patient has been provided with needed resources.  Encounter Outcome:  Patient Visit Completed   Jon Colt [facility]  [facility] Guide, Phone: [phone] Fax: [phone] Website: [email or web address]

## 2023-12-02 ENCOUNTER — Other Ambulatory Visit: Payer: Self-pay

## 2023-12-02 ENCOUNTER — Ambulatory Visit
Admission: RE | Admit: 2023-12-02 | Discharge: 2023-12-02 | Disposition: A | Discharge status: Home / Self Care | Source: Ambulatory Visit | Attending: Nurse Practitioner | Admitting: Nurse Practitioner

## 2023-12-02 DIAGNOSIS — F17200 Nicotine dependence, unspecified, uncomplicated: Secondary | ICD-10-CM

## 2023-12-05 ENCOUNTER — Other Ambulatory Visit: Payer: Self-pay | Admitting: *Deleted

## 2023-12-05 ENCOUNTER — Other Ambulatory Visit: Payer: Self-pay

## 2023-12-05 DIAGNOSIS — I10 Essential (primary) hypertension: Secondary | ICD-10-CM

## 2023-12-05 NOTE — Patient Outreach (Signed)
 Complex Care Management   Visit Note  12/05/2023  Name:  Valerie Reilly MRN: [id number] DOB: 01/31/1976  Situation: Referral received for Complex Care Management related to HTN I obtained verbal consent from Patient.  Visit completed with Patient  on the phone  Background:   Past Medical History:  Diagnosis Date   Allergy    Anxiety    Asthma    Bipolar disorder (HCC)    Diabetes mellitus    Diabetes mellitus, type II (HCC)    Gallstone    GERD (gastroesophageal reflux disease)    History of borderline personality disorder    Hypercholesteremia    Hypertension    Obesity    Psoriasis    Psoriasis    Seasonal allergies    Thyroid  disease     Assessment: Patient Reported Symptoms:  Cognitive Cognitive Status: Able to follow simple commands, Alert and oriented to person, place, and time, Insightful and able to interpret abstract concepts, Normal speech and language skills Cognitive/Intellectual Conditions Management [RPT]: None reported or documented in medical history or problem list   Health Maintenance Behaviors: None Healing Pattern: Average Health Facilitated by: Healthy diet, Rest, Stress management  Neurological Neurological Review of Symptoms: No symptoms reported Neurological Management Strategies: Adequate rest, Routine screening Neurological Self-Management Outcome: 4 (good)  HEENT HEENT Symptoms Reported: Other: (Reports that she has allergies and taking Zytrec for allergies) HEENT Management Strategies: Adequate rest, Medication therapy, Routine screening HEENT Self-Management Outcome: 4 (good)    Cardiovascular Cardiovascular Symptoms Reported: Swelling in legs or feet Cardiovascular Management Strategies: Routine screening, Adequate rest Cardiovascular Self-Management Outcome: 3 (uncertain) Cardiovascular Comment: Reports occassional swelling in her ankles.  Respiratory Respiratory Symptoms Reported: Shortness of breath Other Respiratory Symptoms:  Occassional shortness of breath and wheezing.  Uses Albuterol  inhaler to help Respiratory Management Strategies: Adequate rest, Routine screening Respiratory Self-Management Outcome: 4 (good)  Endocrine Is patient diabetic?: Yes Is patient checking blood sugars at home?: No Endocrine Self-Management Outcome: 3 (uncertain)  Gastrointestinal Additional Gastrointestinal Details: Reports having loss stools.  Reports that has had loose bowel since her gallbladder was taking out. Gastrointestinal Management Strategies: Adequate rest Gastrointestinal Self-Management Outcome: 4 (good)    Genitourinary Genitourinary Symptoms Reported: No symptoms reported Genitourinary Management Strategies: Adequate rest Genitourinary Self-Management Outcome: 4 (good)  Integumentary Integumentary Symptoms Reported: Other Other Integumentary Symptoms: Patient reports that she has psoriasis and uses prescribed medications Skin Management Strategies: Adequate rest, Medication therapy, Routine screening Skin Self-Management Outcome: 4 (good)  Musculoskeletal Musculoskelatal Symptoms Reviewed: Back pain Additional Musculoskeletal Details: Occassional back pain Musculoskeletal Management Strategies: Adequate rest, Routine screening Musculoskeletal Self-Management Outcome: 4 (good) Falls in the past year?: No Number of falls in past year: 1 or less Was there an injury with Fall?: No Fall Risk Category Calculator: 0 Patient Fall Risk Level: Low Fall Risk    Psychosocial Psychosocial Symptoms Reported: Depression - if selected complete PHQ 2-9, Sadness - if selected complete PHQ 2-9, Anxiety - if selected complete GAD Behavioral Management Strategies: Counseling, Medication therapy Behavioral Health Self-Management Outcome: 4 (good) Major Change/Loss/Stressor/Fears (CP): Denies Techniques to Cope with Loss/Stress/Change: Counseling, Medication Quality of Family Relationships: helpful, involved, supportive Do you feel  physically threatened by others?: No    12/05/2023    PHQ2-9 Depression Screening   Little interest or pleasure in doing things Not at all  Feeling down, depressed, or hopeless Not at all  PHQ-2 - Total Score 0  Trouble falling or staying asleep, or sleeping too much  Feeling tired or having little energy    Poor appetite or overeating     Feeling bad about yourself - or that you are a failure or have let yourself or your family down    Trouble concentrating on things, such as reading the newspaper or watching television    Moving or speaking so slowly that other people could have noticed.  Or the opposite - being so fidgety or restless that you have been moving around a lot more than usual    Thoughts that you would be better off dead, or hurting yourself in some way    PHQ2-9 Total Score    If you checked off any problems, how difficult have these problems made it for you to do your work, take care of things at home, or get along with other people    Depression Interventions/Treatment      There were no vitals filed for this visit.  Medications Reviewed Today     Reviewed by Jorja Nichole LABOR, RN (Case Manager) on 12/05/23 at 1202  Med List Status: <None>   Medication Order Taking? Sig Documenting Provider Last Dose Status Informant  albuterol  (VENTOLIN  HFA) 108 (90 Base) MCG/ACT inhaler [id number] Yes Inhale 2 puffs into the lungs every 6 (six) hours as needed for wheezing or shortness of breath. Newlin, Enobong, MD  Active   benazepril  (LOTENSIN ) 20 MG tablet [id number] Yes Take 1 tablet (20 mg total) by mouth daily. Theotis Haze ORN, NP  Active   busPIRone  (BUSPAR ) 30 MG tablet [id number] Yes Take 1 tablet (30 mg total) by mouth 2 (two) times daily. Bahraini, Lauraine LABOR  Active   cholecalciferol (VITAMIN D ) 1000 UNITS tablet [id number] Yes Take 1,000 Units by mouth daily. [provider]  Active Self  clobetasol  (TEMOVATE ) 0.05 % external solution [id number] Yes Apply 1 (one) mL  two times daily to SCALP. after 14 days reduce use to 2 days per week.   Active   clobetasol  cream (TEMOVATE ) 0.05 % [id number] Yes Apply 1 (one) Application two times a day, 2 days per week   Active   famotidine  (PEPCID ) 20 MG tablet [id number] Yes Take 1 tablet (20 mg total) by mouth 2 (two) times daily. Theotis Haze ORN, NP  Active   fexofenadine  (ALLEGRA ) 180 MG tablet [id number]  Take 1 tablet (180 mg total) by mouth daily.  Patient not taking: Reported on 12/05/2023   Fleming, Zelda W, NP  Active   fluocinonide  (LIDEX ) 0.05 % external solution [id number] Yes Apply 1 Application topically 2 (two) times daily as needed   Active   fluticasone  (FLONASE ) 50 MCG/ACT nasal spray [id number] Yes Place 2 sprays into both nostrils daily. Theotis Haze ORN, NP  Active   gabapentin  (NEURONTIN ) 100 MG capsule [id number] Yes Take 1 capsule (100 mg total) by mouth 2 (two) times daily. Bahraini, Sarah A  Active   glipiZIDE  (GLUCOTROL  XL) 10 MG 24 hr tablet [id number] Yes Take 1 tablet (10 mg total) by mouth daily with breakfast. Newlin, Enobong, MD  Active   hydroxypropyl methylcellulose / hypromellose (ISOPTO TEARS / GONIOVISC) 2.5 % ophthalmic solution [id number] Yes Place 1 drop into both eyes in the morning and at bedtime. Theotis Haze ORN, NP  Active   hydrOXYzine  (ATARAX ) 25 MG tablet [id number] Yes Take 0.5-1 tablets (12.5-25 mg total) by mouth 2 (two) times daily as needed for anxiety. Mercy Lauraine LABOR  Active   levothyroxine  (SYNTHROID ) 50 MCG tablet [id number] Yes Take 1 tablet (50 mcg total) by  mouth daily before breakfast. Newlin, Enobong, MD  Active   lidocaine  (LIDODERM ) 5 % [id number] Yes Place 1 patch onto the skin daily. Remove & Discard patch within 12 hours daily, to lower back Brien Belvie BRAVO, MD  Active   lithium  carbonate 300 MG capsule [id number] Yes Take 3 capsules (900 mg total) by mouth at bedtime. Bahraini, Lauraine LABOR  Active   lurasidone  (LATUDA ) 40 MG TABS tablet [id number] Yes Take 1 tablet (40 mg  total) by mouth daily with supper. Should be taken with food (at least 350 calories). Bahraini, Lauraine LABOR  Active   metFORMIN  (GLUCOPHAGE ) 1000 MG tablet [id number] Yes Take 1 tablet (1,000 mg total) by mouth 2 (two) times daily with a meal. Fleming, Zelda W, NP  Active   Multiple Vitamin (MULTIVITAMIN) capsule [id number] Yes Take 1 capsule by mouth daily. [provider]  Active Self  olopatadine  (PATANOL) 0.1 % ophthalmic solution [id number]  Place 1 drop into the right eye 2 (two) times daily.  Patient not taking: Reported on 12/05/2023   Kayla Jeoffrey RAMAN, FNP  Active   simvastatin  (ZOCOR ) 40 MG tablet [id number] Yes Take 1 tablet (40 mg total) by mouth daily. Theotis Haze ORN, NP  Active   terbinafine  (LAMISIL ) 250 MG tablet [id number]  Take 1 tablet (250 mg total) by mouth daily.  Patient not taking: Reported on 12/05/2023   Christine Rush, DPM  Active   traZODone  (DESYREL ) 150 MG tablet [id number] Yes Take 1 tablet (150 mg total) by mouth at bedtime. Mercy Lauraine LABOR  Active             Recommendation:   PCP Follow-up Specialty provider follow-up : [facility]- 12/15/23;  OBGYN-12/14/23; Oncology-12/22/23 Continue Current Plan of Care  Follow Up Plan:   Telephone follow-up 2 weeks: 12/16/23 @ 3pm.  Jaeson Molstad, RN, BSN, ACM RN Care Manager [facility] [phone]

## 2023-12-05 NOTE — Patient Instructions (Signed)
 Visit Information  Ms. Ogas was given information about Medicaid Managed Care team care coordination services as a part of their [facility] Community Plan Medicaid benefit.   If you would like to schedule transportation through your [facility], please call the following number at least 2 days in advance of your appointment: [phone]   Rides for urgent appointments can also be made after hours by calling Member Services.  Call the Behavioral Health Crisis Line at [phone], at any time, 24 hours a day, 7 days a week. If you are in danger or need immediate medical attention call 911.  Please see education materials related to HTN provided by MyChart link.  Patient verbalizes understanding of instructions and care plan provided today and agrees to view in MyChart. Active MyChart status and patient understanding of how to access instructions and care plan via MyChart confirmed with patient.     Telephone follow up appointment with Managed Medicaid care management team member scheduled for: 12/16/23 @ 3 pm.  Jalecia Leon, RN, BSN, ACM RN Care Manager [facility] [phone]    Visit Information  Ms. Zeidman was given information about Medicaid Managed Care team care coordination services as a part of their [facility] Community Plan Medicaid benefit.   If you would like to schedule transportation through your [facility], please call the following number at least 2 days in advance of your appointment: [phone]   Rides for urgent appointments can also be made after hours by calling Member Services.  Call the Behavioral Health Crisis Line at [phone], at any time, 24 hours a day, 7 days a week. If you are in danger or need immediate medical attention call 911.  Please see education materials related to HTN provided by MyChart link.  Patient verbalizes understanding of instructions and care plan provided  today and agrees to view in MyChart. Active MyChart status and patient understanding of how to access instructions and care plan via MyChart confirmed with patient.     Telephone follow up appointment with Managed Medicaid care management team member scheduled for: 12/16/23 @ 3 pm  Orest Dygert, RN, BSN, Theatre manager [employer] [phone]

## 2023-12-05 NOTE — Addendum Note (Signed)
 Addended by: Mareo Portilla A on: 12/05/2023 01:02 PM   Modules accepted: Orders

## 2023-12-08 ENCOUNTER — Other Ambulatory Visit: Payer: Self-pay

## 2023-12-08 DIAGNOSIS — L400 Psoriasis vulgaris: Secondary | ICD-10-CM | POA: Diagnosis not present

## 2023-12-12 ENCOUNTER — Other Ambulatory Visit: Payer: Self-pay | Admitting: Pharmacist

## 2023-12-12 ENCOUNTER — Telehealth: Payer: Self-pay

## 2023-12-12 NOTE — Telephone Encounter (Signed)
 Call for [facility] in radiology RE: findings from 12/02/2023 Lung CT. Secure Chat sent to PCP.

## 2023-12-12 NOTE — Progress Notes (Signed)
 Received [facility] referral for pharmacy assistance in obtaining 90-day supplies for prescriptions. I contacted the patient but was unable to speak with her. Left a HIPAA-compliant VM with instructions to return my call.   Valerie Reilly, PharmD, JAQUELINE, CPP Clinical Pharmacist [facility] [phone]

## 2023-12-14 ENCOUNTER — Ambulatory Visit (INDEPENDENT_AMBULATORY_CARE_PROVIDER_SITE_OTHER): Admitting: Obstetrics and Gynecology

## 2023-12-14 ENCOUNTER — Encounter: Payer: Self-pay | Admitting: Obstetrics and Gynecology

## 2023-12-14 ENCOUNTER — Other Ambulatory Visit: Payer: Self-pay

## 2023-12-14 VITALS — BP 139/73 | HR 74 | Wt 235.9 lb

## 2023-12-14 DIAGNOSIS — N951 Menopausal and female climacteric states: Secondary | ICD-10-CM | POA: Diagnosis not present

## 2023-12-14 DIAGNOSIS — N958 Other specified menopausal and perimenopausal disorders: Secondary | ICD-10-CM | POA: Diagnosis not present

## 2023-12-14 DIAGNOSIS — Z1331 Encounter for screening for depression: Secondary | ICD-10-CM | POA: Diagnosis not present

## 2023-12-14 DIAGNOSIS — R6882 Decreased libido: Secondary | ICD-10-CM

## 2023-12-14 DIAGNOSIS — N941 Unspecified dyspareunia: Secondary | ICD-10-CM

## 2023-12-14 MED ORDER — ESTRADIOL 0.1 MG/GM VA CREA
1.0000 | TOPICAL_CREAM | Freq: Every day | VAGINAL | 12 refills | Status: AC
  Filled 2023-12-14: qty 42.5, 30d supply, fill #0
  Filled 2023-12-14: qty 42.5, 90d supply, fill #0

## 2023-12-14 NOTE — Progress Notes (Unsigned)
 BH MD Outpatient Progress Note  12/15/2023 2:25 PM Valerie Reilly  MRN:  [id number]  Assessment:  Valerie Reilly presents for follow-up evaluation. Today, 12/15/23, patient reports overall stability of mood without sign/sx of depression or mania at this time. Sleeping well with use of trazodone . She denies SI and no acute safety concerns. She left prior place of employment due to issues with management; continues to look for other jobs. No changes to plan of care at this time. She has neurology appointment scheduled in January for further evaluation of mild cognitive impairment.   RTC in 6 weeks by video.  Patient was made aware of this provider's departure from [facility] at the end of Nov 2025 and that she will be transitioned to alternative provider in the clinic after this time. All questions/concerns addressed.  Identifying Information: Valerie Reilly is a 48 y.o. female with a history of bipolar 1 disorder, borderline personality disorder, GAD, T2DM, hypothyroidism on Synthroid , HTN, and HLD who is an established patient with [facility] participating in follow-up via video conferencing. On further review of historical bipolar diagnosis, this diagnosis is felt to be accurate as patient reports history of discrete manic episodes requiring hospitalization during periods of non-adherence to Lithium .   Plan:  # Bipolar 1 disorder currently euthymic  GAD # Borderline personality disorder Past medication trials: Effexor, Depakote (brief) Status of problem: stable Interventions: -- Continue lithium  900 mg nightly  -- Continue Latuda  40 mg qEvening with supper (s5/7/25, i7/3/25) -- Continue buspirone  30 mg 2 times daily -- Continue gabapentin  100 mg BID -- Continue Atarax  12.5-25 mg BID PRN anxiety (using infrequently) -- Continue individual psychotherapy with Bernice Rao [facility]  # Mild cognitive impairment Status of problem: acute Interventions: -- MoCA  performed 11/01/23 with score of 19/30 consistent with mild NCI -- Referral previously placed to neurology with appointment scheduled 03/27/24 -- Will continue to taper medications that may be contributing to cognitive dulling as tolerated (trazodone , gabapentin )  # Sleep regulation Past medication trials: unknown Status of problem: stable Interventions: -- Continue trazodone  150 mg at bedtime   # High risk medication use Interventions: -- Lithium :  -- Kidney function within normal limits 04/13/23  -- TSH within normal limits 04/13/23  -- Lithium  level 0.8 06/29/23  Patient was given contact information for behavioral health clinic and was instructed to call 911 for emergencies.   Subjective:  Chief Complaint:  Chief Complaint  Patient presents with   Medication Management    Interval History:   Patient reports things have been going just fine and mood has remained stable this interval. Anxiety comes and goes - often related to conflicts with daughter. Energy and motivation are iffy but better than before when feeling depressed. 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 Sleeping okay when she remembers to take trazodone . Denies SI/HI. Endorses adherence to current medication regimen and tolerating well. Denies any current questions/concerns and amenable to continuing as prescribed.    Visit Diagnosis:    ICD-10-CM   1. Bipolar I disorder (HCC)  F31.9     2. GAD (generalized anxiety disorder)  F41.1     3. Mild cognitive impairment  G31.84     4. Borderline personality disorder (HCC)  F60.3       Past Psychiatric History:  Diagnoses: Bipolar 1 disorder, borderline personality disorder, cannabis use disorder in remission Medication trials: Effexor, Depakote (brief) Hospitalizations: multiple - last hospitalization at [facility] in 2014  for episode of mania Suicide attempt: yes - many years ago; hx of 2-3 previous attempts  Access to guns:  denies Substance use:   -- Etoh: denies  -- Denies use of cannabis or other illicit drugs  -- Tobacco: 2 ppd; precontemplative regarding cessation  Past Medical History:  Past Medical History:  Diagnosis Date   Allergy    Anxiety    Asthma    Bipolar disorder (HCC)    Diabetes mellitus    Diabetes mellitus, type II (HCC)    Gallstone    GERD (gastroesophageal reflux disease)    History of borderline personality disorder    Hypercholesteremia    Hypertension    Obesity    Psoriasis    Psoriasis    Seasonal allergies    Thyroid  disease     Past Surgical History:  Procedure Laterality Date   CHOLECYSTECTOMY N/A 10/02/2021   Procedure: LAPAROSCOPIC CHOLECYSTECTOMY;  Surgeon: Evonnie Dorothyann LABOR, DO;  Location: [facility];  Service: General;  Laterality: N/A;   TONSILLECTOMY     TUBAL LIGATION  2 years ago    Family Psychiatric History:  Maternal uncle: depression, alcohol use Mother: personality disorder  Family History:  Family History  Problem Relation Age of Onset   Personality disorder Mother    Diabetes Mother    Diabetes Father    Alcohol abuse Maternal Uncle    Depression Maternal Uncle    Diabetes Maternal Grandfather    Breast cancer Neg Hx    Stomach cancer Neg Hx    Colon cancer Neg Hx    Esophageal cancer Neg Hx    Colon polyps Neg Hx    Crohn's disease Neg Hx    Ulcerative colitis Neg Hx    Rectal cancer Neg Hx     Social History:  Social History   Socioeconomic History   Marital status: Divorced    Spouse name: Not on file   Number of children: 1   Years of education: Not on file   Highest education level: Associate degree: occupational, Scientist, product/process development, or vocational program  Occupational History   Occupation: delivery newspaper  Tobacco Use   Smoking status: Every Day    Current packs/day: 2.00    Average packs/day: 2.0 packs/day for 30.0 years (60.0 ttl pk-yrs)    Types: Cigarettes    Passive exposure: Current   Smokeless tobacco: Never   Vaping Use   Vaping status: Never Used  Substance and Sexual Activity   Alcohol use: Yes    Comment: occasional- a few times a year   Drug use: No    Types: Marijuana    Comment: last time was 3 yrs ago   Sexual activity: Yes    Birth control/protection: Surgical    Comment: Tubal ligation  Other Topics Concern   Not on file  Social History Narrative   Not on file   Social Drivers of Health   Financial Resource Strain: High Risk (12/01/2023)   Overall Financial Resource Strain (CARDIA)    Difficulty of Paying Living Expenses: Very hard  Food Insecurity: No Food Insecurity (12/14/2023)   Hunger Vital Sign    Worried About Running Out of Food in the Last Year: Never true    Ran Out of Food in the Last Year: Never true  Recent Concern: Food Insecurity - Food Insecurity Present (12/01/2023)   Hunger Vital Sign    Worried About Running Out of Food in the Last Year: Often true    Ran Out of  Food in the Last Year: Often true  Transportation Needs: No Transportation Needs (12/14/2023)   PRAPARE - Administrator, Civil Service (Medical): No    Lack of Transportation (Non-Medical): No  Physical Activity: Inactive (12/15/2022)   Exercise Vital Sign    Days of Exercise per Week: 0 days    Minutes of Exercise per Session: 0 min  Stress: No Stress Concern Present (12/15/2022)   [employer] of Occupational Health - Occupational Stress Questionnaire    Feeling of Stress : Not at all  Social Connections: Socially Isolated (12/15/2022)   Social Connection and Isolation Panel    Frequency of Communication with Friends and Family: More than three times a week    Frequency of Social Gatherings with Friends and Family: Once a week    Attends Religious Services: Never    Database administrator or Organizations: No    Attends Engineer, structural: Never    Marital Status: Divorced    Allergies: No Known Allergies  Current Medications: Current Outpatient Medications   Medication Sig Dispense Refill   busPIRone  (BUSPAR ) 30 MG tablet Take 1 tablet (30 mg total) by mouth 2 (two) times daily. 180 tablet 1   gabapentin  (NEURONTIN ) 100 MG capsule Take 1 capsule (100 mg total) by mouth 2 (two) times daily. 180 capsule 1   lithium  carbonate 300 MG capsule Take 3 capsules (900 mg total) by mouth at bedtime. 270 capsule 1   lurasidone  (LATUDA ) 40 MG TABS tablet Take 1 tablet (40 mg total) by mouth daily with supper. Should be taken with food (at least 350 calories). 90 tablet 1   traZODone  (DESYREL ) 150 MG tablet Take 1 tablet (150 mg total) by mouth at bedtime. 90 tablet 1   albuterol  (VENTOLIN  HFA) 108 (90 Base) MCG/ACT inhaler Inhale 2 puffs into the lungs every 6 (six) hours as needed for wheezing or shortness of breath. 6.7 g 2   benazepril  (LOTENSIN ) 20 MG tablet Take 1 tablet (20 mg total) by mouth daily. 90 tablet 1   cetirizine (ZYRTEC) 10 MG tablet Take 10 mg by mouth daily.     cholecalciferol (VITAMIN D ) 1000 UNITS tablet Take 1,000 Units by mouth daily.     clobetasol  (TEMOVATE ) 0.05 % external solution Apply 1 (one) mL two times daily to SCALP. after 14 days reduce use to 2 days per week. 50 mL 0   clobetasol  cream (TEMOVATE ) 0.05 % Apply 1 (one) Application two times a day, 2 days per week 60 g 0   estradiol (ESTRACE VAGINAL) 0.1 MG/GM vaginal cream Place 1 gram vaginally at bedtime. Every night for 2 weeks and then twice a week aftewards 42.5 g 12   famotidine  (PEPCID ) 20 MG tablet Take 1 tablet (20 mg total) by mouth 2 (two) times daily. 180 tablet 1   fexofenadine  (ALLEGRA ) 180 MG tablet Take 1 tablet (180 mg total) by mouth daily. (Patient not taking: Reported on 12/14/2023) 90 tablet 1   fluocinonide  (LIDEX ) 0.05 % external solution Apply 1 Application topically 2 (two) times daily as needed 120 mL 0   fluticasone  (FLONASE ) 50 MCG/ACT nasal spray Place 2 sprays into both nostrils daily. 16 g 2   glipiZIDE  (GLUCOTROL  XL) 10 MG 24 hr tablet Take 1 tablet  (10 mg total) by mouth daily with breakfast. 90 tablet 1   hydroxypropyl methylcellulose / hypromellose (ISOPTO TEARS / GONIOVISC) 2.5 % ophthalmic solution Place 1 drop into both eyes in the morning and at  bedtime. 15 mL 3   hydrOXYzine  (ATARAX ) 25 MG tablet Take 0.5-1 tablets (12.5-25 mg total) by mouth 2 (two) times daily as needed for anxiety. 90 tablet 1   levothyroxine  (SYNTHROID ) 50 MCG tablet Take 1 tablet (50 mcg total) by mouth daily before breakfast. 90 tablet 1   lidocaine  (LIDODERM ) 5 % Place 1 patch onto the skin daily. Remove & Discard patch within 12 hours daily, to lower back 30 patch 0   metFORMIN  (GLUCOPHAGE ) 1000 MG tablet Take 1 tablet (1,000 mg total) by mouth 2 (two) times daily with a meal. 180 tablet 1   Multiple Vitamin (MULTIVITAMIN) capsule Take 1 capsule by mouth daily.     olopatadine  (PATANOL) 0.1 % ophthalmic solution Place 1 drop into the right eye 2 (two) times daily. (Patient not taking: Reported on 12/14/2023) 5 mL 12   simvastatin  (ZOCOR ) 40 MG tablet Take 1 tablet (40 mg total) by mouth daily. 90 tablet 1   terbinafine  (LAMISIL ) 250 MG tablet Take 1 tablet (250 mg total) by mouth daily. (Patient not taking: Reported on 12/14/2023) 30 tablet 0   No current facility-administered medications for this visit.    ROS: See above  Objective:  Psychiatric Specialty Exam: Last menstrual period 04/08/2022.There is no height or weight on file to calculate BMI.  General Appearance: Casual and Fairly Groomed  Eye Contact:  Good  Speech:  Clear and Coherent and Normal Rate  Volume:  Normal  Mood:  fine  Affect:  Euthymic; calm  Thought Content: Denies AVH; IOR; paranoia   Suicidal Thoughts:  Denies passive/active SI  Homicidal Thoughts:  No  Thought Process:  Goal Directed and Linear  Orientation:  Full (Time, Place, and Person)    Memory:  Grossly oriented  Judgment:  Good  Insight:  Fair  Concentration:  Concentration: Good  Recall:  NA  Fund of  Knowledge: Good  Language: Good  Psychomotor Activity:  Normal  Akathisia:  NA  AIMS (if indicated): not done  Assets:  Communication Skills Desire for Improvement Housing Intimacy Leisure Time Social Support Vocational/Educational  ADL's:  Intact  Cognition: Impaired,  Mild. MoCA 19/30 11/01/23.  Sleep:  Good   PE: General: sits comfortably in view of camera; no acute distress  Pulm: no increased work of breathing on room air  MSK: all extremity movements appear intact  Neuro: no focal neurological deficits observed  Gait & Station: unable to assess by video   Metabolic Disorder Labs: Lab Results  Component Value Date   HGBA1C 6.1 (H) 06/15/2023   No results found for: PROLACTIN Lab Results  Component Value Date   CHOL 135 12/15/2022   TRIG 109 12/15/2022   HDL 41 12/15/2022   CHOLHDL 3.3 12/15/2022   LDLCALC 74 12/15/2022   LDLCALC 54 06/10/2022   Lab Results  Component Value Date   TSH 1.630 04/13/2023   TSH 1.200 12/15/2022    Therapeutic Level Labs: Lab Results  Component Value Date   LITHIUM  0.8 06/29/2023   LITHIUM  0.6 04/13/2023   No results found for: VALPROATE No results found for: CBMZ  Screenings:  AUDIT    Flowsheet Row Office Visit from 06/10/2022 in [facility] [place]  Alcohol Use Disorder Identification Test Final Score (AUDIT) 5   GAD-7    Flowsheet Row Office Visit from 12/14/2023 in Center for [facility] Patient Outreach Telephone from 12/05/2023 in [place] [facility]  POPULATION HEALTH DEPARTMENT Office Visit from 10/26/2023 in [facility] Office Visit from 06/15/2023 in [facility] [place] [facility] Of [place]. [facility] Office Visit from 04/05/2023 in [facility]  Total GAD-7 Score 0 1 1 13 11    PHQ2-9    Flowsheet Row Office Visit from 12/14/2023 in Center for  [facility] Patient Outreach Telephone from 12/05/2023 in [place] [facility] POPULATION [facility] DEPARTMENT Office Visit from 10/26/2023 in [facility] [place] [facility] Office Visit from 06/15/2023 in [facility] [place] - [facility] Dept Of [place]. [facility] Office Visit from 04/05/2023 in [facility] [facility]  PHQ-2 Total Score 0 0 0 6 4  PHQ-9 Total Score 0 -- -- 16 9   Flowsheet Row Video Visit from 11/05/2021 in [facility] Pre-Admission Testing 60 from 09/29/2021 in [place] [facility] Video Visit from 06/12/2021 in [facility]  C-SSRS RISK CATEGORY No Risk No Risk Low Risk   MoCA 11/01/23: score of 19/30 (see scanned media tab)  Collaboration of Care: Collaboration of Care: Medication Management AEB ongoing medication management, Psychiatrist AEB established with this provider, and Referral or follow-up with counselor/therapist AEB established with individual therapist  Patient/Guardian was advised Release of Information must be obtained prior to any record release in order to collaborate their care with an outside provider. Patient/Guardian was advised if they have not already done so to contact the registration department to sign all necessary forms in order for us  to release information regarding their care.   Consent: Patient/Guardian gives verbal consent for treatment and assignment of benefits for services provided during this visit. Patient/Guardian expressed understanding and agreed to proceed.   Virtual Visit via Video Note  I connected with Thaily S Vitolo on 12/15/23 at  2:00 PM EDT by a video enabled telemedicine application and verified that I am speaking with the correct person using two identifiers.  Location: Patient: parked car in [place] Provider: remote office in [place]   I discussed the limitations of evaluation and management by  telemedicine and the availability of in person appointments. The patient expressed understanding and agreed to proceed.  I discussed the assessment and treatment plan with the patient. The patient was provided an opportunity to ask questions and all were answered. The patient agreed with the plan and demonstrated an understanding of the instructions.   The patient was advised to call back or seek an in-person evaluation if the symptoms worsen or if the condition fails to improve as anticipated.  I provided 25 minutes dedicated to the care of this patient via video on the date of this encounter to include chart review, face-to-face time with the patient, medication management/counseling.  Matelyn Antonelli A Vaishnav Demartin 12/15/2023, 2:25 PM

## 2023-12-14 NOTE — Progress Notes (Signed)
 NEW GYNECOLOGY PATIENT Patient name: Valerie Reilly MRN [id number]  Date of birth: 1976/01/24 Chief Complaint:   Gynecologic Exam  History:  Reports having had cold flushes but not having any hot flashes. Having vaginal dryness and decreased sexual desire. Feels the vagina feels 'tight' as it did before. Lubricant seems to help.   Started using condoms due to recurrent BV. Has not previously had an issue with BV. Has had a TL and has a consistent partner currently. No vaginal itching or abnormal discharge currently. Decreased desire for intercourse primarily because due to it feeling tight and dry but not necessarily painful. Partner is 20 years old. Will have sex because partner desires intercourse but has personally lost a lot of interest. Previously had a lot of interest in intercourse. Wondering if there is any intervention that can help.  Most bothered by the decreased sex drive than the cold flushes.      Gynecologic History Patient's last menstrual period was 04/08/2022 (approximate). Contraception: post menopausal status and tubal ligation   OB History  No obstetric history on file.    The following portions of the patient's history were reviewed and updated as appropriate: allergies, current medications, past family history, past medical history, past social history, past surgical history and problem list. Health Maintenance  Topic Date Due   COVID-19 Vaccine (1) Never done   Hepatitis B Vaccine (1 of 3 - 19+ 3-dose series) Never done   Zoster (Shingles) Vaccine (1 of 2) Never done   Breast Cancer Screening  07/29/2023   Flu Shot  10/07/2023   Complete foot exam   10/15/2023   Yearly kidney health urinalysis for diabetes  12/15/2023   Hemoglobin A1C  12/15/2023   Stool Blood Test  12/30/2023   Yearly kidney function blood test for diabetes  04/12/2024   Screening for Lung Cancer  12/01/2024   Pap with HPV screening  10/13/2025   DTaP/Tdap/Td vaccine (3 - Td or  Tdap) 04/14/2031   Pneumococcal Vaccine for age over 15  Completed   Hepatitis C Screening  Completed   HIV Screening  Completed   HPV Vaccine  Aged Out   Meningitis B Vaccine  Aged Out   Eye exam for diabetics  Discontinued   Colon Cancer Screening  Discontinued     Review of Systems Pertinent items noted in HPI and remainder of comprehensive ROS otherwise negative.  Physical Exam:  BP (!) 142/87   Pulse 73   Wt 235 lb 14.4 oz (107 kg)   LMP 04/08/2022 (Approximate)   BMI 36.95 kg/m  Physical Exam Vitals and nursing note reviewed.  Constitutional:      Appearance: Normal appearance.  Cardiovascular:     Rate and Rhythm: Normal rate.  Pulmonary:     Effort: Pulmonary effort is normal.     Breath sounds: Normal breath sounds.  Neurological:     General: No focal deficit present.     Mental Status: She is alert and oriented to person, place, and time.  Psychiatric:        Mood and Affect: Mood normal.        Behavior: Behavior normal.        Thought Content: Thought content normal.        Judgment: Judgment normal.        Assessment and Plan:   1. Genitourinary syndrome of menopause (Primary) 2. Dyspareunia in female Discussed starting with vaginal treatments for vaginal dryness and discomfort with intercourse. Reviewed use  of lubricant in addition to vaginal estrogen or hyaluronic acid. Given sample of silicone based lubricant to trial and noted can also trial coconut oil. Discussed recurrent BV can be part of constellation of GSM. Noted that symptoms typically improve with use of vaginal estrogen. Improvement typically seen with 2-3 months of use.  - estradiol (ESTRACE VAGINAL) 0.1 MG/GM vaginal cream; Place 1 gram vaginally at bedtime. Every night for 2 weeks and then twice a week aftewards  Dispense: 42.5 g; Refill: 12  3. Menopausal symptoms Discussed likely experiencing menopausal symptoms. Considered menopausal once 1 year amenorrheic and that various symptoms  can be associated with menopause including vaginal changes, mood changes, temperature regulation changes (typically hot flashes), etc.   4. Decreased libido Noted there are fewer treatments available for women. Patient already on buspar . ASCVD ~18%, therefore hormone therapy with estrogen not recommend to treat flushes with associated menopausal symptoms. Will start with treating vaginal symptoms. If no improvement of desire with improvement of vaginal symptoms, can consider increase in buspar , additional of supplements such as ashwaganda.    Follow-up: Return in about 3 months (around 03/15/2024).      Carter Quarry, MD Obstetrician & Gynecologist, Faculty Practice Minimally Invasive Gynecologic Surgery [facility] for [employer], [facility]

## 2023-12-15 ENCOUNTER — Telehealth (HOSPITAL_COMMUNITY): Admitting: Psychiatry

## 2023-12-15 ENCOUNTER — Encounter (HOSPITAL_COMMUNITY): Payer: Self-pay | Admitting: Psychiatry

## 2023-12-15 DIAGNOSIS — F411 Generalized anxiety disorder: Secondary | ICD-10-CM | POA: Diagnosis not present

## 2023-12-15 DIAGNOSIS — F603 Borderline personality disorder: Secondary | ICD-10-CM

## 2023-12-15 DIAGNOSIS — G3184 Mild cognitive impairment, so stated: Secondary | ICD-10-CM

## 2023-12-15 DIAGNOSIS — F319 Bipolar disorder, unspecified: Secondary | ICD-10-CM

## 2023-12-15 NOTE — Patient Instructions (Signed)

## 2023-12-16 ENCOUNTER — Other Ambulatory Visit: Payer: Self-pay | Admitting: *Deleted

## 2023-12-16 ENCOUNTER — Ambulatory Visit: Payer: Self-pay | Admitting: Nurse Practitioner

## 2023-12-16 ENCOUNTER — Other Ambulatory Visit: Payer: Self-pay

## 2023-12-16 NOTE — Patient Outreach (Signed)
 Complex Care Management   Visit Note  12/16/2023  Name:  Valerie Reilly MRN: [id number] DOB: 10/02/1975  Situation: Referral received for Complex Care Management related to HTN I obtained verbal consent from Patient.  Visit completed with Patient  on the phone  Background:   Past Medical History:  Diagnosis Date   Allergy    Anxiety    Asthma    Bipolar disorder (HCC)    Diabetes mellitus    Diabetes mellitus, type II (HCC)    Gallstone    GERD (gastroesophageal reflux disease)    History of borderline personality disorder    Hypercholesteremia    Hypertension    Obesity    Psoriasis    Psoriasis    Seasonal allergies    Thyroid  disease     Assessment: Patient Reported Symptoms:  Cognitive Cognitive Status: Able to follow simple commands, Alert and oriented to person, place, and time, Insightful and able to interpret abstract concepts, Normal speech and language skills Cognitive/Intellectual Conditions Management [RPT]: None reported or documented in medical history or problem list   Health Maintenance Behaviors: None Healing Pattern: Average Health Facilitated by: Healthy diet, Rest, Stress management  Neurological Neurological Review of Symptoms: No symptoms reported Neurological Management Strategies: Adequate rest, Routine screening Neurological Self-Management Outcome: 4 (good)  HEENT HEENT Symptoms Reported: Other: HEENT Management Strategies: Adequate rest, Routine screening, Medication therapy HEENT Self-Management Outcome: 4 (good) HEENT Comment: Patient reports that she has allergies and takes Zyrtec    Cardiovascular Cardiovascular Symptoms Reported: No symptoms reported Cardiovascular Management Strategies: Adequate rest, Routine screening, Coping strategies Cardiovascular Self-Management Outcome: 3 (uncertain) Cardiovascular Comment: Denies any swellling in legs or feet  Respiratory Respiratory Symptoms Reported: No symptoms reported Other  Respiratory Symptoms: Denies any shortness of breath Respiratory Management Strategies: Adequate rest, Routine screening Respiratory Self-Management Outcome: 4 (good)  Endocrine Endocrine Symptoms Reported: No symptoms reported Is patient diabetic?: Yes Is patient checking blood sugars at home?: No Endocrine Self-Management Outcome: 4 (good)  Gastrointestinal Additional Gastrointestinal Details: Reports having loss stools. Reports that she has had loose stools since the removal of her gallbladder.  She informs me that she takes immodium as needed for loss stools. Gastrointestinal Management Strategies: Adequate rest Gastrointestinal Self-Management Outcome: 4 (good)    Genitourinary Genitourinary Symptoms Reported: No symptoms reported Genitourinary Management Strategies: Adequate rest Genitourinary Self-Management Outcome: 4 (good)  Integumentary Integumentary Symptoms Reported: Other Other Integumentary Symptoms: Patient reports that she has psoriasis and uses prescription medication.  Patient reports that she is followed by dermatology.  She informed me that her next Dermatology appointment is for 12/22/23.  She reports that she use to take a shot for the Psoriasis but is now having to try different creams before the insurance will cover the shot for the psoriasis. Skin Management Strategies: Adequate rest, Routine screening, Medication therapy Skin Self-Management Outcome: 4 (good)  Musculoskeletal Musculoskelatal Symptoms Reviewed: Back pain Additional Musculoskeletal Details: Reports that occassional back pain. Musculoskeletal Management Strategies: Adequate rest, Routine screening Musculoskeletal Self-Management Outcome: 4 (good) Falls in the past year?: No Number of falls in past year: 1 or less Was there an injury with Fall?: No Fall Risk Category Calculator: 0 Patient Fall Risk Level: Low Fall Risk Patient at Risk for Falls Due to: No Fall Risks Fall risk Follow up: Falls  evaluation completed  Psychosocial Psychosocial Symptoms Reported: Anxiety - if selected complete GAD, Depression - if selected complete PHQ 2-9 Additional Psychological Details: Patient reports that she saw Psychiatrist on 12/15/23. Behavioral  Management Strategies: Coping strategies, Counseling, Medication therapy Behavioral Health Self-Management Outcome: 4 (good) Major Change/Loss/Stressor/Fears (CP): Denies Techniques to Cope with Loss/Stress/Change: Counseling, Medication Quality of Family Relationships: helpful, involved, supportive Do you feel physically threatened by others?: No    12/16/2023    PHQ2-9 Depression Screening   Little interest or pleasure in doing things Not at all  Feeling down, depressed, or hopeless Not at all  PHQ-2 - Total Score 0  Trouble falling or staying asleep, or sleeping too much Not at all  Feeling tired or having little energy Not at all  Poor appetite or overeating  Not at all  Feeling bad about yourself - or that you are a failure or have let yourself or your family down Not at all  Trouble concentrating on things, such as reading the newspaper or watching television Not at all  Moving or speaking so slowly that other people could have noticed.  Or the opposite - being so fidgety or restless that you have been moving around a lot more than usual Not at all  Thoughts that you would be better off dead, or hurting yourself in some way Not at all  PHQ2-9 Total Score 0  If you checked off any problems, how difficult have these problems made it for you to do your work, take care of things at home, or get along with other people    Depression Interventions/Treatment      There were no vitals filed for this visit.  Medications Reviewed Today     Reviewed by Jorja Nichole LABOR, RN (Case Manager) on 12/16/23 at 1212  Med List Status: <None>   Medication Order Taking? Sig Documenting Provider Last Dose Status Informant  albuterol  (VENTOLIN  HFA) 108 (90  Base) MCG/ACT inhaler [id number] Yes Inhale 2 puffs into the lungs every 6 (six) hours as needed for wheezing or shortness of breath. Newlin, Enobong, MD  Active   benazepril  (LOTENSIN ) 20 MG tablet [id number] Yes Take 1 tablet (20 mg total) by mouth daily. Theotis Haze ORN, NP  Active   busPIRone  (BUSPAR ) 30 MG tablet [id number] Yes Take 1 tablet (30 mg total) by mouth 2 (two) times daily. Bahraini, Lauraine LABOR  Active   cetirizine (ZYRTEC) 10 MG tablet [id number] Yes Take 10 mg by mouth daily. [provider]  Active   cholecalciferol (VITAMIN D ) 1000 UNITS tablet [id number] Yes Take 1,000 Units by mouth daily. [provider]  Active Self  clobetasol  (TEMOVATE ) 0.05 % external solution [id number] Yes Apply 1 (one) mL two times daily to SCALP. after 14 days reduce use to 2 days per week.   Active   clobetasol  cream (TEMOVATE ) 0.05 % [id number] Yes Apply 1 (one) Application two times a day, 2 days per week   Active   estradiol (ESTRACE VAGINAL) 0.1 MG/GM vaginal cream [id number] Yes Place 1 gram vaginally at bedtime. Every night for 2 weeks and then twice a week aftewards Ajewole, Christana, MD  Active   famotidine  (PEPCID ) 20 MG tablet [id number] Yes Take 1 tablet (20 mg total) by mouth 2 (two) times daily. Theotis Haze ORN, NP  Active   fexofenadine  (ALLEGRA ) 180 MG tablet [id number]  Take 1 tablet (180 mg total) by mouth daily.  Patient not taking: Reported on 12/16/2023   Fleming, Zelda W, NP  Active   fluocinonide  (LIDEX ) 0.05 % external solution [id number] Yes Apply 1 Application topically 2 (two) times daily as needed   Active   fluticasone  (FLONASE ) 50 MCG/ACT nasal spray [id number]  Yes Place 2 sprays into both nostrils daily. Fleming, Zelda W, NP  Active   gabapentin  (NEURONTIN ) 100 MG capsule [id number] Yes Take 1 capsule (100 mg total) by mouth 2 (two) times daily. Bahraini, Lauraine LABOR  Active   glipiZIDE  (GLUCOTROL  XL) 10 MG 24 hr tablet [id number] Yes Take 1 tablet (10 mg total) by mouth  daily with breakfast. Newlin, Enobong, MD  Active   hydroxypropyl methylcellulose / hypromellose (ISOPTO TEARS / GONIOVISC) 2.5 % ophthalmic solution [id number] Yes Place 1 drop into both eyes in the morning and at bedtime. Theotis Haze ORN, NP  Active   hydrOXYzine  (ATARAX ) 25 MG tablet [id number] Yes Take 0.5-1 tablets (12.5-25 mg total) by mouth 2 (two) times daily as needed for anxiety. Bahraini, Lauraine LABOR  Active   levothyroxine  (SYNTHROID ) 50 MCG tablet [id number] Yes Take 1 tablet (50 mcg total) by mouth daily before breakfast. Delbert Clam, MD  Active   lidocaine  (LIDODERM ) 5 % [id number] Yes Place 1 patch onto the skin daily. Remove & Discard patch within 12 hours daily, to lower back Brien Belvie BRAVO, MD  Active   lithium  carbonate 300 MG capsule [id number] Yes Take 3 capsules (900 mg total) by mouth at bedtime. Bahraini, Lauraine LABOR  Active   lurasidone  (LATUDA ) 40 MG TABS tablet [id number] Yes Take 1 tablet (40 mg total) by mouth daily with supper. Should be taken with food (at least 350 calories). Bahraini, Lauraine LABOR  Active   metFORMIN  (GLUCOPHAGE ) 1000 MG tablet [id number] Yes Take 1 tablet (1,000 mg total) by mouth 2 (two) times daily with a meal. Fleming, Zelda W, NP  Active   Multiple Vitamin (MULTIVITAMIN) capsule [id number] Yes Take 1 capsule by mouth daily. [provider]  Active Self  olopatadine  (PATANOL) 0.1 % ophthalmic solution [id number]  Place 1 drop into the right eye 2 (two) times daily.  Patient not taking: Reported on 12/16/2023   Kayla Jeoffrey RAMAN, FNP  Active   simvastatin  (ZOCOR ) 40 MG tablet [id number]  Take 1 tablet (40 mg total) by mouth daily. Theotis Haze ORN, NP  Active   terbinafine  (LAMISIL ) 250 MG tablet [id number]  Take 1 tablet (250 mg total) by mouth daily.  Patient not taking: Reported on 12/14/2023   Christine Rush, DPM  Active   traZODone  (DESYREL ) 150 MG tablet [id number] Yes Take 1 tablet (150 mg total) by mouth at bedtime. Mercy Lauraine LABOR  Active              Recommendation:   PCP Follow-up Specialty provider follow-up : [facility]-12/19/23; Oncology and Dermatology-12/22/23; Neurology- 03/27/24  Continue Current Plan of Care  Follow Up Plan:   Telephone follow-up in 1 month: 01/19/24 @ 9:30 am  Samella Lucchetti, RN, BSN, ACM RN Care Manager [employer] [phone]

## 2023-12-19 ENCOUNTER — Ambulatory Visit (INDEPENDENT_AMBULATORY_CARE_PROVIDER_SITE_OTHER): Admitting: Mental Health

## 2023-12-19 DIAGNOSIS — F411 Generalized anxiety disorder: Secondary | ICD-10-CM

## 2023-12-19 DIAGNOSIS — F319 Bipolar disorder, unspecified: Secondary | ICD-10-CM | POA: Diagnosis not present

## 2023-12-19 NOTE — Progress Notes (Signed)
   THERAPIST PROGRESS NOTE Virtual Visit via Video Note  I connected with Valerie Reilly on 12/19/23 at  9:00 AM EDT by a video enabled telemedicine application and verified that I am speaking with the correct person using two identifiers.  Location: Patient: home address on file Provider: remote office   I discussed the limitations of evaluation and management by telemedicine and the availability of in person appointments. The patient expressed understanding and agreed to proceed.  I discussed the assessment and treatment plan with the patient. The patient was provided an opportunity to ask questions and all were answered. The patient agreed with the plan and demonstrated an understanding of the instructions.   The patient was advised to call back or seek an in-person evaluation if the symptoms worsen or if the condition fails to improve as anticipated.  I provided 48 minutes of non-face-to-face time during this encounter.   Ty Bernice Savant, [facility]   Session Time: 9:02 am (   Participation Level: Active  Behavioral Response: Fairly GroomedAlertWNL  Type of Therapy: Individual Therapy  Treatment Goals addressed:  STG: Deal better. Valerie Reilly will increase management of stress AEB ability to process events in balanced way with engagement in at least x 1 coping skills for stress as needed with the next 90 days.   ProgressTowards Goals: Progressing  Interventions: Supportive  Summary:  Valerie Reilly is a 48 y.o. female who presents with dx of bipolar disorder, borderline personality disorder and generalized anxiety. Presents to session alert and oriented; mood and affect stable. Shares for moods I've been ok. Shares concerns with discord with daughter and concerns for irresponsibility and navigating mother relationship and roommate. Notes desire to live alone with fiance and explores options to refinances or sell. Shares to have quit most recent position and now looking  for additional employment. Explores with therapist plan to obtain new employment and identifying set days and times to work through feelings of low motivation. Shares for moods to have been low with low energy and motivation and feels she was doing better when she was working. Identifies going for walks and water aerobics. Denies safety concerns. Notes coping with stress with cleaning and cooking. Ongoing work towards goals.    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 Completed check in and assessed for current level of functioning, sxs management and stressors. Provided safe space for to share concerns and provided supportive feedback. Supported in processing concerns for daughter and discord with bills and navigating living together. Explored options for housing and ability to secure housing independently. Explored benefits of working on mood and daily routine. Supported in identifying plan to explore work and engaging in daily activity. Explored options to increase activity during the day. Reviewed session and provided follow up.   Plan: Return again in  x 3 weeks.  Diagnosis: Bipolar I disorder (HCC)  GAD (generalized anxiety disorder)  Collaboration of Care: Other None  Patient/Guardian was advised Release of Information must be obtained prior to any record release in order to collaborate their care with an outside provider. Patient/Guardian was advised if they have not already done so to contact the registration department to sign all necessary forms in order for us  to release information regarding their care.   Consent: Patient/Guardian gives verbal consent for treatment and assignment of benefits for services provided during this visit. Patient/Guardian expressed understanding and agreed to proceed.   Ty Bernice McNeil, [facility] 12/19/2023

## 2023-12-22 ENCOUNTER — Other Ambulatory Visit: Payer: Self-pay

## 2023-12-22 ENCOUNTER — Ambulatory Visit: Payer: Self-pay

## 2023-12-22 ENCOUNTER — Ambulatory Visit: Admitting: Family Medicine

## 2023-12-22 DIAGNOSIS — L400 Psoriasis vulgaris: Secondary | ICD-10-CM | POA: Diagnosis not present

## 2023-12-22 MED ORDER — METHOTREXATE SODIUM 2.5 MG PO TABS
ORAL_TABLET | ORAL | 0 refills | Status: DC
  Filled 2023-12-22: qty 12, 28d supply, fill #0

## 2023-12-26 ENCOUNTER — Other Ambulatory Visit: Payer: Self-pay

## 2023-12-29 ENCOUNTER — Other Ambulatory Visit: Payer: Self-pay

## 2023-12-29 ENCOUNTER — Other Ambulatory Visit: Payer: Self-pay | Admitting: Nurse Practitioner

## 2023-12-29 DIAGNOSIS — J3089 Other allergic rhinitis: Secondary | ICD-10-CM

## 2023-12-30 ENCOUNTER — Other Ambulatory Visit: Payer: Self-pay | Admitting: Nurse Practitioner

## 2023-12-30 ENCOUNTER — Other Ambulatory Visit: Payer: Self-pay

## 2023-12-30 DIAGNOSIS — J3089 Other allergic rhinitis: Secondary | ICD-10-CM

## 2023-12-30 MED ORDER — FLUTICASONE PROPIONATE 50 MCG/ACT NA SUSP
2.0000 | Freq: Every day | NASAL | 2 refills | Status: AC
  Filled 2023-12-30: qty 16, 30d supply, fill #0
  Filled 2024-02-27: qty 16, 30d supply, fill #1
  Filled 2024-04-03 – 2024-04-12 (×2): qty 16, 30d supply, fill #0

## 2023-12-30 NOTE — Telephone Encounter (Signed)
 Requested Prescriptions  Pending Prescriptions Disp Refills   fluticasone  (FLONASE ) 50 MCG/ACT nasal spray 16 g 2    Sig: Place 2 sprays into both nostrils daily.     Ear, Nose, and Throat: Nasal Preparations - Corticosteroids Passed - 12/30/2023  2:55 PM      Passed - Valid encounter within last 12 months    Recent Outpatient Visits           1 month ago High risk heterosexual behavior   [facility] [place] Theotis Haze ORN, NP   2 months ago Viral conjunctivitis of right eye   [facility] Kayla Jeoffrey RAMAN, FNP   5 months ago Psoriasis   [facility] [place] Theotis Haze ORN, NP   6 months ago Acute vaginitis   [facility] [place]. [facility] Theotis Haze ORN, NP   8 months ago GAD (generalized anxiety disorder)   [facility] Kayla Jeoffrey RAMAN, [place]

## 2024-01-02 ENCOUNTER — Ambulatory Visit: Payer: Self-pay

## 2024-01-02 NOTE — Telephone Encounter (Signed)
 FYI Only or Action Required?: FYI only for provider.  Patient was last seen in primary care on 11/21/2023 by Theotis Haze ORN, NP.  Called Nurse Triage reporting Breast Problem.  Symptoms began several days ago.  Interventions attempted: Nothing.  Symptoms are: gradually worsening.  Triage Disposition: See Physician Within 24 Hours  Patient/caregiver understands and will follow disposition?: Yes       Message from [facility] C sent at 01/02/2024  8:10 AM EDT  Summary: left breast skin irritation   Reason for Triage: The patient is concerned that they may be developing a cyst under their left breast and would like to speak with a member of clinical staff further to discuss.         Reason for Disposition  [1] Breast looks infected (spreading redness, feels hot or painful to touch) AND [2] no fever  Answer Assessment - Initial Assessment Questions 1. SYMPTOM: What's the main symptom you're concerned about?  (e.g., lump, nipple discharge, pain, rash)     Irritation  2. LOCATION: Where is the Irritation located?     Center of L breast area underneath  3. ONSET: When did Irritation  start?     5 days ago  4. PRIOR HISTORY: Do you have any history of prior problems with your breasts? (e.g., breast cancer, breast implant, fibrocystic breast disease)     Denies  5. CAUSE: What do you think is causing this symptom?     A possible cyst  6. OTHER SYMPTOMS: Do you have any other symptoms? (e.g., breast pain, fever, nipple discharge, redness or rash)     Redness and foul drainage    She states she has had a sore that was in the area for while that did not cause symptoms. The patient states she has had some clear drainage from the area. No open availability in office until December. Called [facility] to see if patient could be seen sooner. No earlier availability to be seen in office until November. Patient given information for the mobile medicine clinic to be seen today for  symptoms.  Protocols used: Breast Symptoms-A-AH

## 2024-01-03 NOTE — Telephone Encounter (Signed)
 Noted

## 2024-01-03 NOTE — Telephone Encounter (Signed)
 She can be seen by any of our offices for this or mobile unit.

## 2024-01-03 NOTE — Telephone Encounter (Signed)
 Thank you :)

## 2024-01-05 ENCOUNTER — Other Ambulatory Visit: Payer: Self-pay

## 2024-01-05 ENCOUNTER — Ambulatory Visit: Attending: Internal Medicine | Admitting: Critical Care Medicine

## 2024-01-05 ENCOUNTER — Encounter: Payer: Self-pay | Admitting: Critical Care Medicine

## 2024-01-05 VITALS — BP 141/85 | HR 74 | Temp 98.2°F | Resp 20 | Ht 67.0 in | Wt 240.0 lb

## 2024-01-05 DIAGNOSIS — N611 Abscess of the breast and nipple: Secondary | ICD-10-CM | POA: Insufficient documentation

## 2024-01-05 DIAGNOSIS — J301 Allergic rhinitis due to pollen: Secondary | ICD-10-CM | POA: Diagnosis not present

## 2024-01-05 MED ORDER — CEPHALEXIN 500 MG PO CAPS
500.0000 mg | ORAL_CAPSULE | Freq: Four times a day (QID) | ORAL | 0 refills | Status: AC
  Filled 2024-01-05: qty 28, 7d supply, fill #0

## 2024-01-05 MED ORDER — AZELASTINE HCL 0.1 % NA SOLN
2.0000 | Freq: Two times a day (BID) | NASAL | 12 refills | Status: AC
  Filled 2024-01-05: qty 30, 34d supply, fill #0

## 2024-01-05 MED ORDER — FLUCONAZOLE 150 MG PO TABS
150.0000 mg | ORAL_TABLET | Freq: Once | ORAL | 0 refills | Status: AC
  Filled 2024-01-05: qty 1, 1d supply, fill #0

## 2024-01-05 NOTE — Assessment & Plan Note (Signed)
 Will add saline nasal and Astelin nasal to Flonase  and oral antihistamine program no sinusitis seen

## 2024-01-05 NOTE — Progress Notes (Signed)
   Acute Office Visit  Subjective:     Patient ID: Valerie Reilly, female    DOB: 1975-03-25, 48 y.o.   MRN: [id number]  Chief complaint open wound under left breast  HPI Patient is in today for acute work in visit for wound under left breast also significant environmental and seasonal allergies with irritation in eyes and in sinus Patient had a CT scan screening for lung cancer I reviewed the results with the patient this needs to be rechecked in 1 year  Review of Systems  HENT:  Positive for congestion. Negative for ear discharge, ear pain, nosebleeds, sinus pain and sore throat.   Eyes:  Positive for redness.  Respiratory:  Negative for stridor.         Objective:    BP (!) 141/85 (BP Location: Left Arm, Patient Position: Sitting, Cuff Size: Large)   Pulse 74   LMP 04/08/2022 (Approximate)    Physical Exam Skin:    Findings: Lesion present.     Comments: Under left breast is a skin abscess that is opened and draining purulent thick material it is tender to palpation    Diagnosis: abscess - Location: left breast underneath Procedure: Incision & drainage Informed consent:  Discussed risks (permanent loss of nail, permanent irregular growth of nail, infection, pain, bleeding, bruising, numbness, and recurrence of the condition) and benefits of the procedure, as well as the alternatives.  Informed consent was obtained. Anesthesia: 1% lidocaine  injection Type: partial The area was prepared and draped in a standard fashion. The lesion drained pus. The patient tolerated the procedure well. The patient was instructed on post-op care.        Assessment & Plan:   Problem List Items Addressed This Visit   None   Meds ordered this encounter  Medications   fluconazole  (DIFLUCAN ) 150 MG tablet    Sig: Take 1 tablet (150 mg total) by mouth once for 1 dose.    Dispense:  1 tablet    Refill:  0   cephALEXin (KEFLEX) 500 MG capsule    Sig: Take 1 capsule (500 mg total)  by mouth 4 (four) times daily for 7 days.    Dispense:  28 capsule    Refill:  0   azelastine (ASTELIN) 0.1 % nasal spray    Sig: Place 2 sprays into both nostrils 2 (two) times daily. Use in each nostril as directed    Dispense:  30 mL    Refill:  12    Return in about 4 days (around 01/09/2024).  Belvie Silvan, MD

## 2024-01-05 NOTE — Patient Instructions (Signed)
 Take cephalexin 1 4 times daily for 7 days for skin abscess  Take fluconazole  1 tablet x 1 to prevent yeast infection  Begin Astelin nasal spray 2 sprays each nostril twice a day  Increase the Flonase  to 2 sprays each nostril twice a day for 1 week then reduce to once daily  Get over-the-counter saline nasal spray rinse 3 sprays into each nostril twice a day and do this before you apply the medicated sprays  If there is any infection in the eye or up in the sinuses the cephalexin will cover that as well  Dressing was placed on the wound I would leave it on today and tomorrow and then take the dressing off and place a new one on we gave you supplies for this and then I would change the dressing daily  Will have you return on Monday for a wound check next week

## 2024-01-05 NOTE — Progress Notes (Signed)
 Open wound on under left breast.

## 2024-01-05 NOTE — Assessment & Plan Note (Signed)
 Skin abscess present this was incised and drained and will place patient on 7 days of cephalexin 500 mg 4 times daily  To prevent yeast overgrowth a one-time dose of fluconazole  150 mg was given

## 2024-01-06 ENCOUNTER — Other Ambulatory Visit: Payer: Self-pay | Admitting: Family Medicine

## 2024-01-06 ENCOUNTER — Other Ambulatory Visit: Payer: Self-pay

## 2024-01-06 ENCOUNTER — Telehealth: Payer: Self-pay | Admitting: Nurse Practitioner

## 2024-01-06 ENCOUNTER — Other Ambulatory Visit: Payer: Self-pay | Admitting: Nurse Practitioner

## 2024-01-06 DIAGNOSIS — I10 Essential (primary) hypertension: Secondary | ICD-10-CM

## 2024-01-06 DIAGNOSIS — E7800 Pure hypercholesterolemia, unspecified: Secondary | ICD-10-CM

## 2024-01-06 DIAGNOSIS — E119 Type 2 diabetes mellitus without complications: Secondary | ICD-10-CM

## 2024-01-06 DIAGNOSIS — L400 Psoriasis vulgaris: Secondary | ICD-10-CM | POA: Diagnosis not present

## 2024-01-06 MED ORDER — BENAZEPRIL HCL 20 MG PO TABS
20.0000 mg | ORAL_TABLET | Freq: Every day | ORAL | 1 refills | Status: AC
  Filled 2024-01-06: qty 90, 90d supply, fill #0
  Filled 2024-03-31: qty 90, 90d supply, fill #1
  Filled 2024-04-03: qty 90, 90d supply, fill #0

## 2024-01-06 MED ORDER — SIMVASTATIN 40 MG PO TABS
40.0000 mg | ORAL_TABLET | Freq: Every day | ORAL | 1 refills | Status: AC
  Filled 2024-01-06: qty 90, 90d supply, fill #0
  Filled 2024-03-31: qty 90, 90d supply, fill #1
  Filled 2024-04-03: qty 90, 90d supply, fill #0

## 2024-01-06 MED ORDER — GLIPIZIDE ER 10 MG PO TB24
10.0000 mg | ORAL_TABLET | Freq: Every day | ORAL | 1 refills | Status: AC
  Filled 2024-01-06: qty 90, 90d supply, fill #0
  Filled 2024-03-31: qty 90, 90d supply, fill #1
  Filled 2024-04-03: qty 90, 90d supply, fill #0

## 2024-01-06 NOTE — Telephone Encounter (Signed)
  Pt requesting CMA to reach out to her regarding  last visit, she needs instruction on how to change her bandages before her appt.

## 2024-01-09 ENCOUNTER — Other Ambulatory Visit (HOSPITAL_COMMUNITY): Payer: Self-pay

## 2024-01-09 ENCOUNTER — Ambulatory Visit (INDEPENDENT_AMBULATORY_CARE_PROVIDER_SITE_OTHER): Admitting: Mental Health

## 2024-01-09 ENCOUNTER — Other Ambulatory Visit: Payer: Self-pay

## 2024-01-09 ENCOUNTER — Other Ambulatory Visit: Payer: Self-pay | Admitting: Nurse Practitioner

## 2024-01-09 DIAGNOSIS — F319 Bipolar disorder, unspecified: Secondary | ICD-10-CM | POA: Diagnosis not present

## 2024-01-09 DIAGNOSIS — F411 Generalized anxiety disorder: Secondary | ICD-10-CM

## 2024-01-09 NOTE — Telephone Encounter (Signed)
 Phone number rings and sounds like someone answers but does not say 'Hello back.  I have introduced myself in the phone of silence.  Called twice and patient stated she was saying hello. The hello from the other side of the phone from my end could not be heard.   Patient states she had been changing dressing with the help of her boyfriend. She has an appointment tomorrow.   Denies drainage at this time.

## 2024-01-09 NOTE — Progress Notes (Unsigned)
   THERAPIST PROGRESS NOTE Virtual Visit via Video Note  I connected with Valerie Reilly on 01/09/24 at  3:00 PM EST by a video enabled telemedicine application and verified that I am speaking with the correct person using two identifiers.  Location: Patient: home address on file Provider: remote office   I discussed the limitations of evaluation and management by telemedicine and the availability of in person appointments. The patient expressed understanding and agreed to proceed.  I discussed the assessment and treatment plan with the patient. The patient was provided an opportunity to ask questions and all were answered. The patient agreed with the plan and demonstrated an understanding of the instructions.   The patient was advised to call back or seek an in-person evaluation if the symptoms worsen or if the condition fails to improve as anticipated.  I provided 41 minutes of non-face-to-face time during this encounter.   Ty Bernice Savant, [facility]   Session Time: 3:03 pm ( 41 minutes)  Participation Level: Active  Behavioral Response: CasualAlertWNL  Type of Therapy: Individual Therapy  Treatment Goals addressed:   STG: Deal better. Venesha will increase management of stress AEB ability to process events in balanced way with engagement in at least x 1 coping skills for stress as needed with the next 90 days.   ProgressTowards Goals: Progressing  Interventions: Supportive  Summary:  Valerie Reilly is a 48 y.o. female who presents with dx of bipolar disorder, borderline personality disorder and generalized anxiety. Presents to session lethargic and oriented; mood and affect stable. Shares for moods I've been ok. Shares for moods to be stable and denies excessive highs or lows. Shares decrease in daily activity but notes has been able to complete x 2 applications today. Shares thoughts on benefits of employment. Denies engagement in many activities throughout the day  noting attending to house some.  I know I need to be more active. I can feel it. Explores with therapist options for increased engagement in community and benefits of reframining active into further adulthood. Shares ability to manage stressors adequately and exploring ability to continue to co-habitate with daughter. Denies safety concerns. Progress with goals.   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 Completed check in and assessed for current level of functioning, sxs management and stressors. Provided safe space for to share concerns and provided supportive feedback. Supported in processing current barriers to progress with employment and ability to set daily schedule for self. Encouraged increase in daily activity and community engagement and connection to stress management, balance in daily life and supportive of good mental and physical health. Reviewed session.   Plan: Return again in  x 4 weeks.  Diagnosis: Bipolar I disorder (HCC)  GAD (generalized anxiety disorder)  Collaboration of Care: Other None  Patient/Guardian was advised Release of Information must be obtained prior to any record release in order to collaborate their care with an outside provider. Patient/Guardian was advised if they have not already done so to contact the registration department to sign all necessary forms in order for us  to release information regarding their care.   Consent: Patient/Guardian gives verbal consent for treatment and assignment of benefits for services provided during this visit. Patient/Guardian expressed understanding and agreed to proceed.   Ty Bernice Seabrook, [facility] 01/09/2024

## 2024-01-10 ENCOUNTER — Encounter: Payer: Self-pay | Admitting: Nurse Practitioner

## 2024-01-10 ENCOUNTER — Ambulatory Visit: Attending: Nurse Practitioner | Admitting: Nurse Practitioner

## 2024-01-10 ENCOUNTER — Other Ambulatory Visit: Payer: Self-pay

## 2024-01-10 VITALS — BP 128/75 | Ht 67.0 in | Wt 230.4 lb

## 2024-01-10 DIAGNOSIS — N611 Abscess of the breast and nipple: Secondary | ICD-10-CM | POA: Diagnosis not present

## 2024-01-10 DIAGNOSIS — Z23 Encounter for immunization: Secondary | ICD-10-CM | POA: Diagnosis not present

## 2024-01-10 NOTE — Progress Notes (Signed)
 Assessment & Plan:  Cache was seen today for breast problem.  Diagnoses and all orders for this visit:  Left breast abscess Continue keflex and dry dressing for the next 3 days.  Submammary abscess healing well, no active drainage, flat area, tenderness present. - Extend antibiotics for a few more days. - Advise continued wound dressing until antibiotics are completed. - Provide additional dressing material if needed.  Need for influenza vaccination -     Flu vaccine trivalent PF, 6mos and older(Flulaval,Afluria,Fluarix,Fluzone)    Patient has been counseled on age-appropriate routine health concerns for screening and prevention. These are reviewed and up-to-date. Referrals have been placed accordingly. Immunizations are up-to-date or declined.    Subjective:   Chief Complaint  Patient presents with   Breast Problem    Follow up.    Valerie Reilly 48 y.o. female presents to office today for follow up to left breast abscess following an I&D on 01-05-2024. She has been taking keflex 500 mg QID and is currently on day 4/7   PER OFFICE NOTE ON 01-05-2024 Patient is in today for acute work in visit for wound under left breast. Under left breast is a skin abscess that is opened and draining purulent thick material it is tender to palpation     Review of Systems  Constitutional:  Negative for chills and fever.  Respiratory: Negative.    Cardiovascular: Negative.   Skin:        SEE HPI    Past Medical History:  Diagnosis Date   Allergy    Anxiety    Asthma    Bipolar disorder (HCC)    Diabetes mellitus    Diabetes mellitus, type II (HCC)    Gallstone    GERD (gastroesophageal reflux disease)    History of borderline personality disorder    Hypercholesteremia    Hypertension    Obesity    Psoriasis    Psoriasis    Seasonal allergies    Thyroid  disease     Past Surgical History:  Procedure Laterality Date   CHOLECYSTECTOMY N/A 10/02/2021   Procedure:  LAPAROSCOPIC CHOLECYSTECTOMY;  Surgeon: Evonnie Dorothyann LABOR, DO;  Location: [facility];  Service: General;  Laterality: N/A;   TONSILLECTOMY     TUBAL LIGATION  2 years ago    Family History  Problem Relation Age of Onset   Personality disorder Mother    Diabetes Mother    Diabetes Father    Alcohol abuse Maternal Uncle    Depression Maternal Uncle    Diabetes Maternal Grandfather    Breast cancer Neg Hx    Stomach cancer Neg Hx    Colon cancer Neg Hx    Esophageal cancer Neg Hx    Colon polyps Neg Hx    Crohn's disease Neg Hx    Ulcerative colitis Neg Hx    Rectal cancer Neg Hx     Social History Reviewed with no changes to be made today.   Outpatient Medications Prior to Visit  Medication Sig Dispense Refill   albuterol  (VENTOLIN  HFA) 108 (90 Base) MCG/ACT inhaler Inhale 2 puffs into the lungs every 6 (six) hours as needed for wheezing or shortness of breath. 6.7 g 2   azelastine (ASTELIN) 0.1 % nasal spray Place 2 sprays into both nostrils 2 (two) times daily. Use in each nostril as directed 30 mL 12   benazepril  (LOTENSIN ) 20 MG tablet Take 1 tablet (20 mg total) by mouth daily. 90 tablet 1   busPIRone  (  BUSPAR ) 30 MG tablet Take 1 tablet (30 mg total) by mouth 2 (two) times daily. 180 tablet 1   cephALEXin (KEFLEX) 500 MG capsule Take 1 capsule (500 mg total) by mouth 4 (four) times daily for 7 days. 28 capsule 0   cetirizine (ZYRTEC) 10 MG tablet Take 10 mg by mouth daily.     cholecalciferol (VITAMIN D ) 1000 UNITS tablet Take 1,000 Units by mouth daily.     clobetasol  (TEMOVATE ) 0.05 % external solution Apply 1 (one) mL two times daily to SCALP. after 14 days reduce use to 2 days per week. 50 mL 0   clobetasol  cream (TEMOVATE ) 0.05 % Apply 1 (one) Application two times a day, 2 days per week 60 g 0   estradiol (ESTRACE VAGINAL) 0.1 MG/GM vaginal cream Place 1 gram vaginally at bedtime. Every night for 2 weeks and then twice a week aftewards 42.5 g 12   famotidine  (PEPCID )  20 MG tablet Take 1 tablet (20 mg total) by mouth 2 (two) times daily. 180 tablet 1   fluocinonide  (LIDEX ) 0.05 % external solution Apply 1 Application topically 2 (two) times daily as needed 120 mL 0   fluticasone  (FLONASE ) 50 MCG/ACT nasal spray Place 2 sprays into both nostrils daily. 16 g 2   gabapentin  (NEURONTIN ) 100 MG capsule Take 1 capsule (100 mg total) by mouth 2 (two) times daily. 180 capsule 1   glipiZIDE  (GLUCOTROL  XL) 10 MG 24 hr tablet Take 1 tablet (10 mg total) by mouth daily with breakfast. 90 tablet 1   hydroxypropyl methylcellulose / hypromellose (ISOPTO TEARS / GONIOVISC) 2.5 % ophthalmic solution Place 1 drop into both eyes in the morning and at bedtime. 15 mL 3   hydrOXYzine  (ATARAX ) 25 MG tablet Take 0.5-1 tablets (12.5-25 mg total) by mouth 2 (two) times daily as needed for anxiety. 90 tablet 1   lidocaine  (LIDODERM ) 5 % Place 1 patch onto the skin daily. Remove & Discard patch within 12 hours daily, to lower back 30 patch 0   lithium  carbonate 300 MG capsule Take 3 capsules (900 mg total) by mouth at bedtime. 270 capsule 1   lurasidone  (LATUDA ) 40 MG TABS tablet Take 1 tablet (40 mg total) by mouth daily with supper. Should be taken with food (at least 350 calories). 90 tablet 1   methotrexate  (RHEUMATREX) 2.5 MG tablet Take 1 tablet by mouth every 12 hours for 3 doses per week. (only 3 tablets per week) 12 tablet 0   Multiple Vitamin (MULTIVITAMIN) capsule Take 1 capsule by mouth daily.     simvastatin  (ZOCOR ) 40 MG tablet Take 1 tablet (40 mg total) by mouth daily. 90 tablet 1   traZODone  (DESYREL ) 150 MG tablet Take 1 tablet (150 mg total) by mouth at bedtime. 90 tablet 1   fexofenadine  (ALLEGRA ) 180 MG tablet Take 1 tablet (180 mg total) by mouth daily. (Patient not taking: Reported on 12/16/2023) 90 tablet 1   No facility-administered medications prior to visit.    No Known Allergies     Objective:    BP 128/75 (BP Location: Left Arm, Patient Position: Sitting,  Cuff Size: Normal)   Ht 5' 7 (1.702 m)   Wt 230 lb 6.4 oz (104.5 kg)   LMP 04/08/2022 (Approximate)   BMI 36.09 kg/m  Wt Readings from Last 3 Encounters:  01/10/24 230 lb 6.4 oz (104.5 kg)  01/05/24 240 lb (108.9 kg)  12/14/23 235 lb 14.4 oz (107 kg)    Physical Exam Constitutional:  General: She is not in acute distress.    Appearance: She is not ill-appearing.  HENT:     Head: Normocephalic.  Cardiovascular:     Rate and Rhythm: Normal rate and regular rhythm.  Pulmonary:     Effort: Pulmonary effort is normal.     Breath sounds: Normal breath sounds.  Skin:    General: Skin is warm and dry.     Findings: Erythema (minimal) present.  Neurological:     Mental Status: She is alert.  Psychiatric:        Mood and Affect: Mood normal.          Patient has been counseled extensively about nutrition and exercise as well as the importance of adherence with medications and regular follow-up. The patient was given clear instructions to go to ER or return to medical center if symptoms don't improve, worsen or new problems develop. The patient verbalized understanding.   Follow-up: Return if symptoms worsen or fail to improve.   Haze LELON Servant, FNP-BC [facility] [place], [place] [phone]   01/10/2024, 4:09 PM

## 2024-01-16 ENCOUNTER — Other Ambulatory Visit: Payer: Self-pay

## 2024-01-16 ENCOUNTER — Ambulatory Visit: Payer: Self-pay

## 2024-01-16 ENCOUNTER — Ambulatory Visit: Attending: Family Medicine | Admitting: Family Medicine

## 2024-01-16 ENCOUNTER — Encounter: Payer: Self-pay | Admitting: Family Medicine

## 2024-01-16 ENCOUNTER — Telehealth: Payer: Self-pay | Admitting: Nurse Practitioner

## 2024-01-16 VITALS — BP 134/81 | HR 68 | Temp 98.6°F | Ht 67.0 in | Wt 229.4 lb

## 2024-01-16 DIAGNOSIS — E119 Type 2 diabetes mellitus without complications: Secondary | ICD-10-CM

## 2024-01-16 DIAGNOSIS — R410 Disorientation, unspecified: Secondary | ICD-10-CM | POA: Diagnosis not present

## 2024-01-16 DIAGNOSIS — G3184 Mild cognitive impairment, so stated: Secondary | ICD-10-CM

## 2024-01-16 DIAGNOSIS — N3001 Acute cystitis with hematuria: Secondary | ICD-10-CM

## 2024-01-16 DIAGNOSIS — Z7984 Long term (current) use of oral hypoglycemic drugs: Secondary | ICD-10-CM | POA: Diagnosis not present

## 2024-01-16 LAB — POCT URINALYSIS DIP (CLINITEK)
Glucose, UA: NEGATIVE mg/dL
Ketones, POC UA: NEGATIVE mg/dL
Nitrite, UA: NEGATIVE
POC PROTEIN,UA: 100 — AB
Spec Grav, UA: 1.025 (ref 1.010–1.025)
Urobilinogen, UA: 0.2 U/dL
pH, UA: 6.5 (ref 5.0–8.0)

## 2024-01-16 LAB — GLUCOSE, POCT (MANUAL RESULT ENTRY): POC Glucose: 164 mg/dL — AB (ref 70–99)

## 2024-01-16 MED ORDER — NITROFURANTOIN MONOHYD MACRO 100 MG PO CAPS
100.0000 mg | ORAL_CAPSULE | Freq: Two times a day (BID) | ORAL | 0 refills | Status: DC
  Filled 2024-01-16: qty 10, 5d supply, fill #0

## 2024-01-16 NOTE — Patient Instructions (Signed)
 VISIT SUMMARY:  During today's visit, we discussed your recent symptoms, including confusion, tremors, and memory issues. We also addressed your urinary symptoms and reviewed your current medications.  YOUR PLAN:  -ACUTE CONFUSION AND COGNITIVE DECLINE: Acute confusion and cognitive decline can be caused by various factors, including medication issues, infections, or neurological conditions. We have ordered a CT scan of your head to rule out a stroke, blood tests to check for infection, and a lithium  level to assess for possible toxicity. We recommend using a pill box to help manage your medications. If your symptoms worsen, please go to the emergency room immediately.  -ACUTE CYSTITIS WITH HEMATURIA: Acute cystitis is a bladder infection that can cause blood in the urine and other urinary symptoms. We have prescribed nitrofurantoin to treat the infection and sent a urine culture to identify the specific bacteria causing the infection and determine the best antibiotic to use.  INSTRUCTIONS:  Please follow up with us  after your CT scan and lab results are available. If your symptoms worsen, especially the confusion or tremors, go to the emergency room immediately.

## 2024-01-16 NOTE — Telephone Encounter (Addendum)
 Patient's daughter Mitzie Pedregon requested a referral to a mental health counselor/ psychiatrist for her mother. Please Advise.

## 2024-01-16 NOTE — Telephone Encounter (Signed)
 FYI Only or Action Required?: Action required by provider: request for appointment.  Patient was last seen in primary care on 01/10/2024 by Theotis Haze ORN, NP.  Called Nurse Triage reporting No chief complaint on file..  Symptoms began several months ago.  Interventions attempted: Rest, hydration, or home remedies.  Symptoms are: gradually worsening.  Triage Disposition: See PCP Within 2 Weeks  Patient/caregiver understands and will follow disposition?: Yes  Copied from CRM [id number]. Topic: Clinical - Red Word Triage >> Jan 16, 2024 12:14 PM Delon HERO wrote: Red Word that prompted transfer to Nurse Triage: Mitzie Garretson daughter is calling to report the patient forgetful & confusion started over the weekend. Patient is confused who is she talking. Patient is unsure if she took her medication.   And tremors- unable to write her name. Patient reporting that there have been previous tremors. Patient reporting over balance in the shower.   Patient reporting sleep disturbance - unable to sleep.  Patient lost sense of taste & smell.  Talks slow, moves slow, Reason for Disposition  [1] Longstanding confusion (e.g., dementia, stroke) AND [2] NO worsening or change  Answer Assessment - Initial Assessment Questions 1. LEVEL OF CONSCIOUSNESS: How are they (the patient) acting right now? (e.g., alert-oriented, confused, lethargic, stuporous, comatose)     Confused, Loss of Memory  2. ONSET: When did the confusion start?  (e.g., minutes, hours, days)     Over the weekend  3. PATTERN: Does this come and go, or has it been constant since it started?  Is it present now?     Sudden, Acute  4. ALCOHOL or DRUGS: Have they been drinking alcohol or taking any drugs?      No  5. NARCOTIC MEDICINES: Have they been receiving any narcotic medications? (e.g., morphine, Vicodin)     No  6. CAUSE: What do you think is causing the confusion?      Unsure  7. OTHER SYMPTOMS:  Are there any other symptoms? (e.g., difficulty breathing, fever, headache, weakness)     Tremors, Bradykinesia, Alterations in Taste and Smell,  Protocols used: Confusion - Delirium-A-AH

## 2024-01-16 NOTE — Progress Notes (Addendum)
 Subjective:  Patient ID: Valerie Reilly, female    DOB: 12-29-1975  Age: 48 y.o. MRN: [id number]  CC: Altered Mental Status     Discussed the use of AI scribe software for clinical note transcription with the patient, who gave verbal consent to proceed.  History of Present Illness Valerie Reilly is a 48 year old female patient of Haze Servant, FNP with history of hypertension, bipolar disorder, mild cognitive impairment who presents with sudden onset confusion and tremors. She is accompanied by her daughter who provides most of the history.  Confusion was noticed over the last 2 to 3 days.Confusion necessitates guidance for activities such as showering. She exhibits stumbling, slow walking, daughter also states her mom was attempting to write but the writing did not come out as neatly as it usually would.  She denies presence of weakness of her extremities, no facial droop, no gait abnormalities.  Symptoms included difficulty remembering medication intake and completing tasks like writing.  She denied any recent fever, dysuria but completed a course of Keflex for left breast abscess prescribed by her PCP a week ago.  Uncontrollable hand tremors, distinct from her usual anxiety-related tremors, have been present for weeks and interfere with daily activities. and inappropriate smiling. Visual disturbances include seeing things peripherally. She reports a loss of smell and altered taste, describing everything as tasting unpleasant.  She has experienced memory issues for a couple of months and had been referred to neurology by her psychiatrist due to mild cognitive impairment.  She has trouble sleeping and has been drooling. Her daughter observes a lack of desire to engage in activities and inadequate eating, sleeping, and drinking.  She is on lithium  for bipolar disorder, and there is concern about her medication management due to her confusion.    Past Medical History:  Diagnosis Date    Allergy    Anxiety    Asthma    Bipolar disorder (HCC)    Diabetes mellitus    Diabetes mellitus, type II (HCC)    Gallstone    GERD (gastroesophageal reflux disease)    History of borderline personality disorder    Hypercholesteremia    Hypertension    Obesity    Psoriasis    Psoriasis    Seasonal allergies    Thyroid  disease     Past Surgical History:  Procedure Laterality Date   CHOLECYSTECTOMY N/A 10/02/2021   Procedure: LAPAROSCOPIC CHOLECYSTECTOMY;  Surgeon: Evonnie Dorothyann LABOR, DO;  Location: [facility];  Service: General;  Laterality: N/A;   TONSILLECTOMY     TUBAL LIGATION  2 years ago    Family History  Problem Relation Age of Onset   Personality disorder Mother    Diabetes Mother    Diabetes Father    Alcohol abuse Maternal Uncle    Depression Maternal Uncle    Diabetes Maternal Grandfather    Breast cancer Neg Hx    Stomach cancer Neg Hx    Colon cancer Neg Hx    Esophageal cancer Neg Hx    Colon polyps Neg Hx    Crohn's disease Neg Hx    Ulcerative colitis Neg Hx    Rectal cancer Neg Hx     Social History   Socioeconomic History   Marital status: Divorced    Spouse name: Not on file   Number of children: 1   Years of education: Not on file   Highest education level: Associate degree: occupational, scientist, product/process development, or vocational program  Occupational History  Occupation: delivery newspaper  Tobacco Use   Smoking status: Every Day    Current packs/day: 2.00    Average packs/day: 2.0 packs/day for 30.0 years (60.0 ttl pk-yrs)    Types: Cigarettes    Passive exposure: Current   Smokeless tobacco: Never  Vaping Use   Vaping status: Never Used  Substance and Sexual Activity   Alcohol use: Yes    Comment: occasional- a few times a year   Drug use: No    Types: Marijuana    Comment: last time was 3 yrs ago   Sexual activity: Yes    Birth control/protection: Surgical    Comment: Tubal ligation  Other Topics Concern   Not on file  Social  History Narrative   Not on file   Social Drivers of Health   Financial Resource Strain: High Risk (12/01/2023)   Overall Financial Resource Strain (CARDIA)    Difficulty of Paying Living Expenses: Very hard  Food Insecurity: Food Insecurity Present (12/16/2023)   Hunger Vital Sign    Worried About Running Out of Food in the Last Year: Sometimes true    Ran Out of Food in the Last Year: Sometimes true  Transportation Needs: No Transportation Needs (12/16/2023)   PRAPARE - Administrator, Civil Service (Medical): No    Lack of Transportation (Non-Medical): No  Physical Activity: Inactive (12/15/2022)   Exercise Vital Sign    Days of Exercise per Week: 0 days    Minutes of Exercise per Session: 0 min  Stress: No Stress Concern Present (12/15/2022)   [employer] of Occupational Health - Occupational Stress Questionnaire    Feeling of Stress : Not at all  Social Connections: Socially Isolated (12/15/2022)   Social Connection and Isolation Panel    Frequency of Communication with Friends and Family: More than three times a week    Frequency of Social Gatherings with Friends and Family: Once a week    Attends Religious Services: Never    Database Administrator or Organizations: No    Attends Engineer, Structural: Never    Marital Status: Divorced    No Known Allergies  Outpatient Medications Prior to Visit  Medication Sig Dispense Refill   albuterol  (VENTOLIN  HFA) 108 (90 Base) MCG/ACT inhaler Inhale 2 puffs into the lungs every 6 (six) hours as needed for wheezing or shortness of breath. 6.7 g 2   azelastine (ASTELIN) 0.1 % nasal spray Place 2 sprays into both nostrils 2 (two) times daily. Use in each nostril as directed 30 mL 12   benazepril  (LOTENSIN ) 20 MG tablet Take 1 tablet (20 mg total) by mouth daily. 90 tablet 1   busPIRone  (BUSPAR ) 30 MG tablet Take 1 tablet (30 mg total) by mouth 2 (two) times daily. 180 tablet 1   cetirizine (ZYRTEC) 10 MG tablet  Take 10 mg by mouth daily.     cholecalciferol (VITAMIN D ) 1000 UNITS tablet Take 1,000 Units by mouth daily.     clobetasol  (TEMOVATE ) 0.05 % external solution Apply 1 (one) mL two times daily to SCALP. after 14 days reduce use to 2 days per week. 50 mL 0   clobetasol  cream (TEMOVATE ) 0.05 % Apply 1 (one) Application two times a day, 2 days per week 60 g 0   estradiol (ESTRACE VAGINAL) 0.1 MG/GM vaginal cream Place 1 gram vaginally at bedtime. Every night for 2 weeks and then twice a week aftewards 42.5 g 12   famotidine  (PEPCID ) 20 MG tablet  Take 1 tablet (20 mg total) by mouth 2 (two) times daily. 180 tablet 1   fluocinonide  (LIDEX ) 0.05 % external solution Apply 1 Application topically 2 (two) times daily as needed 120 mL 0   fluticasone  (FLONASE ) 50 MCG/ACT nasal spray Place 2 sprays into both nostrils daily. 16 g 2   gabapentin  (NEURONTIN ) 100 MG capsule Take 1 capsule (100 mg total) by mouth 2 (two) times daily. 180 capsule 1   glipiZIDE  (GLUCOTROL  XL) 10 MG 24 hr tablet Take 1 tablet (10 mg total) by mouth daily with breakfast. 90 tablet 1   hydroxypropyl methylcellulose / hypromellose (ISOPTO TEARS / GONIOVISC) 2.5 % ophthalmic solution Place 1 drop into both eyes in the morning and at bedtime. 15 mL 3   hydrOXYzine  (ATARAX ) 25 MG tablet Take 0.5-1 tablets (12.5-25 mg total) by mouth 2 (two) times daily as needed for anxiety. 90 tablet 1   lidocaine  (LIDODERM ) 5 % Place 1 patch onto the skin daily. Remove & Discard patch within 12 hours daily, to lower back 30 patch 0   lithium  carbonate 300 MG capsule Take 3 capsules (900 mg total) by mouth at bedtime. 270 capsule 1   lurasidone  (LATUDA ) 40 MG TABS tablet Take 1 tablet (40 mg total) by mouth daily with supper. Should be taken with food (at least 350 calories). 90 tablet 1   methotrexate  (RHEUMATREX) 2.5 MG tablet Take 1 tablet by mouth every 12 hours for 3 doses per week. (only 3 tablets per week) 12 tablet 0   Multiple Vitamin  (MULTIVITAMIN) capsule Take 1 capsule by mouth daily.     simvastatin  (ZOCOR ) 40 MG tablet Take 1 tablet (40 mg total) by mouth daily. 90 tablet 1   traZODone  (DESYREL ) 150 MG tablet Take 1 tablet (150 mg total) by mouth at bedtime. 90 tablet 1   No facility-administered medications prior to visit.     ROS Review of Systems  Constitutional:  Negative for activity change and appetite change.  HENT:  Negative for sinus pressure and sore throat.   Respiratory:  Negative for chest tightness, shortness of breath and wheezing.   Cardiovascular:  Negative for chest pain and palpitations.  Gastrointestinal:  Negative for abdominal distention, abdominal pain and constipation.  Genitourinary: Negative.   Musculoskeletal: Negative.   Skin:  Positive for rash.  Neurological:  Positive for tremors.  Psychiatric/Behavioral:  Positive for behavioral problems and confusion. Negative for dysphoric mood.     Objective:  BP 134/81   Pulse 68   Temp 98.6 F (37 C) (Oral)   Ht 5' 7 (1.702 m)   Wt 229 lb 6.4 oz (104.1 kg)   LMP 04/08/2022 (Approximate)   SpO2 98%   BMI 35.93 kg/m      01/16/2024    4:05 PM 01/10/2024    3:50 PM 01/05/2024   11:25 AM  BP/Weight  Systolic BP 134 128 141  Diastolic BP 81 75 85  Wt. (Lbs) 229.4 230.4 240  BMI 35.93 kg/m2 36.09 kg/m2 37.59 kg/m2      Physical Exam Constitutional:      Appearance: She is well-developed.  Cardiovascular:     Rate and Rhythm: Normal rate.     Heart sounds: Normal heart sounds. No murmur heard. Pulmonary:     Effort: Pulmonary effort is normal.     Breath sounds: Normal breath sounds. No wheezing or rales.  Chest:     Chest wall: No tenderness.  Abdominal:     General: Bowel sounds  are normal. There is no distension.     Palpations: Abdomen is soft. There is no mass.     Tenderness: There is no abdominal tenderness.  Musculoskeletal:        General: Normal range of motion.     Right lower leg: No edema.     Left  lower leg: No edema.  Skin:    Comments: Large Psoriatic plaques on elbows  Neurological:     General: No focal deficit present.     Mental Status: She is alert and oriented to person, place, and time.     Cranial Nerves: No cranial nerve deficit.     Motor: No weakness.     Coordination: Coordination abnormal (intention tremors).     Gait: Gait normal.  Psychiatric:        Mood and Affect: Mood normal.        Latest Ref Rng & Units 09/22/2023    3:12 PM 08/11/2023    4:46 PM 04/13/2023    9:22 AM  CMP  Glucose 70 - 99 mg/dL   864   BUN 6 - 24 mg/dL   15   Creatinine 9.42 - 1.00 mg/dL   9.17   Sodium 865 - 855 mmol/L   138   Potassium 3.5 - 5.2 mmol/L   4.5   Chloride 96 - 106 mmol/L   103   CO2 20 - 29 mmol/L   18   Calcium 8.7 - 10.2 mg/dL   89.9   Total Protein 6.1 - 8.1 g/dL 6.6  6.8  6.5   Total Bilirubin 0.2 - 1.2 mg/dL 0.2  0.4  <9.7   Alkaline Phos 44 - 121 IU/L   75   AST 10 - 35 U/L 15  23  17    ALT 6 - 29 U/L 21  38  21     Lipid Panel     Component Value Date/Time   CHOL 135 12/15/2022 0932   TRIG 109 12/15/2022 0932   HDL 41 12/15/2022 0932   CHOLHDL 3.3 12/15/2022 0932   LDLCALC 74 12/15/2022 0932    CBC    Component Value Date/Time   WBC 13.3 (H) 11/21/2023 1150   WBC 11.9 (H) 10/13/2021 0919   RBC 4.49 11/21/2023 1150   RBC 4.65 10/13/2021 0919   HGB 14.4 11/21/2023 1150   HCT 43.9 11/21/2023 1150   PLT 330 11/21/2023 1150   MCV 98 (H) 11/21/2023 1150   MCH 32.1 11/21/2023 1150   MCH 31.9 02/02/2021 1021   MCHC 32.8 11/21/2023 1150   MCHC 33.8 10/13/2021 0919   RDW 12.8 11/21/2023 1150   LYMPHSABS 3.0 11/21/2023 1150   MONOABS 0.8 10/13/2021 0919   EOSABS 0.7 (H) 11/21/2023 1150   BASOSABS 0.1 11/21/2023 1150    Lab Results  Component Value Date   HGBA1C 6.1 (H) 06/15/2023       Assessment & Plan Acute confusion and cognitive decline (including mild cognitive impairment) Acute confusion with tremors, medication confusion, writing  difficulty, and inappropriate smiling. Differential includes stroke, lithium  toxicity, infection. Mild cognitive impairment history. Family history of Parkinson's, dementia. Visual disturbances, drooling noted. Possible lithium  toxicity due to adherence issues. -No focal neurologic deficits on exam - Ordered CT scan of the head to rule out stroke. - Ordered comprehensive metabolic panel and complete blood count to check for infection. - Ordered lithium  level to assess for toxicity. - Advised use of a pill box for medication management. - Instructed to take  her to the emergency room if symptoms worsen.  Acute cystitis with hematuria Hematuria, proteinuria, leukocyte esterase present. Confusion and urinary issues noted. No fever or dysuria. - Prescribed nitrofurantoin for cystitis. - Sent urine culture to identify specific bacteria and antibiotic sensitivity.   Type 2 diabetes mellitus Last A1c was 6.1. CBG is normal at 164   Meds ordered this encounter  Medications   nitrofurantoin, macrocrystal-monohydrate, (MACROBID) 100 MG capsule    Sig: Take 1 capsule (100 mg total) by mouth 2 (two) times daily.    Dispense:  10 capsule    Refill:  0    Follow-up: Return in about 3 months (around 04/17/2024) for Medical conditions with PCP.       Corrina Sabin, MD, FAAFP. [facility] [place], [place] [phone]   01/16/2024, 5:06 PM

## 2024-01-17 ENCOUNTER — Other Ambulatory Visit

## 2024-01-17 ENCOUNTER — Telehealth: Payer: Self-pay

## 2024-01-17 ENCOUNTER — Ambulatory Visit: Payer: Self-pay | Admitting: Family Medicine

## 2024-01-17 ENCOUNTER — Inpatient Hospital Stay (HOSPITAL_COMMUNITY)
Admission: EM | Admit: 2024-01-17 | Discharge: 2024-01-20 | DRG: 918 | Disposition: A | Discharge status: Home / Self Care | Source: Ambulatory Visit | Attending: Internal Medicine | Admitting: Internal Medicine

## 2024-01-17 ENCOUNTER — Encounter (HOSPITAL_COMMUNITY): Payer: Self-pay | Admitting: Internal Medicine

## 2024-01-17 ENCOUNTER — Other Ambulatory Visit: Payer: Self-pay

## 2024-01-17 ENCOUNTER — Telehealth: Payer: Self-pay | Admitting: Nurse Practitioner

## 2024-01-17 DIAGNOSIS — Z818 Family history of other mental and behavioral disorders: Secondary | ICD-10-CM

## 2024-01-17 DIAGNOSIS — Z6832 Body mass index (BMI) 32.0-32.9, adult: Secondary | ICD-10-CM

## 2024-01-17 DIAGNOSIS — E669 Obesity, unspecified: Secondary | ICD-10-CM | POA: Diagnosis present

## 2024-01-17 DIAGNOSIS — Z7984 Long term (current) use of oral hypoglycemic drugs: Secondary | ICD-10-CM

## 2024-01-17 DIAGNOSIS — L409 Psoriasis, unspecified: Secondary | ICD-10-CM | POA: Diagnosis present

## 2024-01-17 DIAGNOSIS — E1165 Type 2 diabetes mellitus with hyperglycemia: Secondary | ICD-10-CM | POA: Diagnosis present

## 2024-01-17 DIAGNOSIS — I10 Essential (primary) hypertension: Secondary | ICD-10-CM | POA: Diagnosis present

## 2024-01-17 DIAGNOSIS — T56891A Toxic effect of other metals, accidental (unintentional), initial encounter: Principal | ICD-10-CM | POA: Diagnosis present

## 2024-01-17 DIAGNOSIS — E039 Hypothyroidism, unspecified: Secondary | ICD-10-CM | POA: Diagnosis present

## 2024-01-17 DIAGNOSIS — F319 Bipolar disorder, unspecified: Secondary | ICD-10-CM | POA: Diagnosis present

## 2024-01-17 DIAGNOSIS — N390 Urinary tract infection, site not specified: Secondary | ICD-10-CM | POA: Diagnosis present

## 2024-01-17 DIAGNOSIS — H00019 Hordeolum externum unspecified eye, unspecified eyelid: Secondary | ICD-10-CM | POA: Diagnosis present

## 2024-01-17 DIAGNOSIS — Z79899 Other long term (current) drug therapy: Secondary | ICD-10-CM

## 2024-01-17 DIAGNOSIS — E860 Dehydration: Secondary | ICD-10-CM | POA: Diagnosis present

## 2024-01-17 DIAGNOSIS — B370 Candidal stomatitis: Secondary | ICD-10-CM | POA: Diagnosis present

## 2024-01-17 DIAGNOSIS — F603 Borderline personality disorder: Secondary | ICD-10-CM | POA: Diagnosis present

## 2024-01-17 DIAGNOSIS — F17210 Nicotine dependence, cigarettes, uncomplicated: Secondary | ICD-10-CM | POA: Diagnosis present

## 2024-01-17 DIAGNOSIS — K219 Gastro-esophageal reflux disease without esophagitis: Secondary | ICD-10-CM | POA: Diagnosis present

## 2024-01-17 DIAGNOSIS — F419 Anxiety disorder, unspecified: Secondary | ICD-10-CM | POA: Diagnosis present

## 2024-01-17 DIAGNOSIS — Z79890 Hormone replacement therapy (postmenopausal): Secondary | ICD-10-CM

## 2024-01-17 DIAGNOSIS — Z716 Tobacco abuse counseling: Secondary | ICD-10-CM

## 2024-01-17 DIAGNOSIS — Z833 Family history of diabetes mellitus: Secondary | ICD-10-CM

## 2024-01-17 DIAGNOSIS — T43591A Poisoning by other antipsychotics and neuroleptics, accidental (unintentional), initial encounter: Principal | ICD-10-CM | POA: Diagnosis present

## 2024-01-17 DIAGNOSIS — E1140 Type 2 diabetes mellitus with diabetic neuropathy, unspecified: Secondary | ICD-10-CM | POA: Diagnosis present

## 2024-01-17 DIAGNOSIS — E7800 Pure hypercholesterolemia, unspecified: Secondary | ICD-10-CM | POA: Diagnosis present

## 2024-01-17 DIAGNOSIS — J45909 Unspecified asthma, uncomplicated: Secondary | ICD-10-CM | POA: Diagnosis present

## 2024-01-17 DIAGNOSIS — Z1231 Encounter for screening mammogram for malignant neoplasm of breast: Secondary | ICD-10-CM

## 2024-01-17 DIAGNOSIS — Z79631 Long term (current) use of antimetabolite agent: Secondary | ICD-10-CM

## 2024-01-17 DIAGNOSIS — G3184 Mild cognitive impairment, so stated: Secondary | ICD-10-CM | POA: Diagnosis present

## 2024-01-17 LAB — URINALYSIS, W/ REFLEX TO CULTURE (INFECTION SUSPECTED)
Bilirubin Urine: NEGATIVE
Glucose, UA: 50 mg/dL — AB
Hgb urine dipstick: NEGATIVE
Ketones, ur: NEGATIVE mg/dL
Nitrite: NEGATIVE
Protein, ur: 30 mg/dL — AB
Specific Gravity, Urine: 1.018 (ref 1.005–1.030)
pH: 6 (ref 5.0–8.0)

## 2024-01-17 LAB — CMP14+EGFR
ALT: 31 IU/L (ref 0–32)
AST: 18 IU/L (ref 0–40)
Albumin: 4.8 g/dL (ref 3.9–4.9)
Alkaline Phosphatase: 138 IU/L — ABNORMAL HIGH (ref 41–116)
BUN/Creatinine Ratio: 19 (ref 9–23)
BUN: 20 mg/dL (ref 6–24)
Bilirubin Total: 0.5 mg/dL (ref 0.0–1.2)
CO2: 19 mmol/L — ABNORMAL LOW (ref 20–29)
Calcium: 11.3 mg/dL — ABNORMAL HIGH (ref 8.7–10.2)
Chloride: 106 mmol/L (ref 96–106)
Creatinine, Ser: 1.06 mg/dL — ABNORMAL HIGH (ref 0.57–1.00)
Globulin, Total: 2.7 g/dL (ref 1.5–4.5)
Glucose: 154 mg/dL — ABNORMAL HIGH (ref 70–99)
Potassium: 4.5 mmol/L (ref 3.5–5.2)
Sodium: 138 mmol/L (ref 134–144)
Total Protein: 7.5 g/dL (ref 6.0–8.5)
eGFR: 64 mL/min/1.73 (ref >59)

## 2024-01-17 LAB — COMPREHENSIVE METABOLIC PANEL WITH GFR
ALT: 28 U/L (ref 0–44)
AST: 21 U/L (ref 15–41)
Albumin: 4.5 g/dL (ref 3.5–5.0)
Alkaline Phosphatase: 132 U/L — ABNORMAL HIGH (ref 38–126)
Anion gap: 11 (ref 5–15)
BUN: 21 mg/dL — ABNORMAL HIGH (ref 6–20)
CO2: 20 mmol/L — ABNORMAL LOW (ref 22–32)
Calcium: 10.9 mg/dL — ABNORMAL HIGH (ref 8.9–10.3)
Chloride: 105 mmol/L (ref 98–111)
Creatinine, Ser: 1.09 mg/dL — ABNORMAL HIGH (ref 0.44–1.00)
GFR, Estimated: >60 mL/min (ref >60)
Glucose, Bld: 230 mg/dL — ABNORMAL HIGH (ref 70–99)
Potassium: 4.1 mmol/L (ref 3.5–5.1)
Sodium: 137 mmol/L (ref 135–145)
Total Bilirubin: 0.4 mg/dL (ref 0.0–1.2)
Total Protein: 7.1 g/dL (ref 6.5–8.1)

## 2024-01-17 LAB — CBC WITH DIFFERENTIAL/PLATELET
Abs Immature Granulocytes: 0.09 K/uL — ABNORMAL HIGH (ref 0.00–0.07)
Basophils Absolute: 0.1 x10E3/uL (ref 0.0–0.2)
Basophils Absolute: 0.1 K/uL (ref 0.0–0.1)
Basophils Relative: 1 %
Basos: 1 %
EOS (ABSOLUTE): 0.5 x10E3/uL — ABNORMAL HIGH (ref 0.0–0.4)
Eos: 4 %
Eosinophils Absolute: 0.4 K/uL (ref 0.0–0.5)
Eosinophils Relative: 3 %
HCT: 39.9 % (ref 36.0–46.0)
Hematocrit: 42.8 % (ref 34.0–46.6)
Hemoglobin: 14.3 g/dL (ref 11.1–15.9)
Hemoglobin: 13.1 g/dL (ref 12.0–15.0)
Immature Grans (Abs): 0.1 x10E3/uL (ref 0.0–0.1)
Immature Granulocytes: 1 %
Immature Granulocytes: 1 %
Lymphocytes Absolute: 2.9 x10E3/uL (ref 0.7–3.1)
Lymphocytes Relative: 17 %
Lymphs Abs: 2.3 K/uL (ref 0.7–4.0)
Lymphs: 20 %
MCH: 32.1 pg (ref 26.6–33.0)
MCH: 32.2 pg (ref 26.0–34.0)
MCHC: 33.4 g/dL (ref 31.5–35.7)
MCHC: 32.8 g/dL (ref 30.0–36.0)
MCV: 96 fL (ref 79–97)
MCV: 98 fL (ref 80.0–100.0)
Monocytes Absolute: 1.2 x10E3/uL — ABNORMAL HIGH (ref 0.1–0.9)
Monocytes Absolute: 0.8 K/uL (ref 0.1–1.0)
Monocytes Relative: 5 %
Monocytes: 8 %
Neutro Abs: 10.1 K/uL — ABNORMAL HIGH (ref 1.7–7.7)
Neutrophils Absolute: 9.5 x10E3/uL — ABNORMAL HIGH (ref 1.4–7.0)
Neutrophils Relative %: 73 %
Neutrophils: 66 %
Platelets: 399 x10E3/uL (ref 150–450)
Platelets: 354 K/uL (ref 150–400)
RBC: 4.46 x10E6/uL (ref 3.77–5.28)
RBC: 4.07 MIL/uL (ref 3.87–5.11)
RDW: 12.8 % (ref 11.7–15.4)
RDW: 13.6 % (ref 11.5–15.5)
WBC: 14.3 x10E3/uL — ABNORMAL HIGH (ref 3.4–10.8)
WBC: 13.8 K/uL — ABNORMAL HIGH (ref 4.0–10.5)
nRBC: 0 % (ref 0.0–0.2)

## 2024-01-17 LAB — PHOSPHORUS: Phosphorus: 3.5 mg/dL (ref 2.5–4.6)

## 2024-01-17 LAB — LITHIUM LEVEL
Lithium Lvl: 1.7 mmol/L (ref 0.5–1.2)
Lithium Lvl: 1.88 mmol/L (ref 0.60–1.20)

## 2024-01-17 LAB — MAGNESIUM: Magnesium: 2 mg/dL (ref 1.7–2.4)

## 2024-01-17 MED ORDER — ACETAMINOPHEN 650 MG RE SUPP: 650.0000 mg | Freq: Four times a day (QID) | RECTAL | Status: DC | PRN

## 2024-01-17 MED ORDER — POLYVINYL ALCOHOL 1.4 % OP SOLN
1.0000 [drp] | Freq: Three times a day (TID) | OPHTHALMIC | Status: DC
  Administered 2024-01-17 – 2024-01-20 (×8): 1 [drp] via OPHTHALMIC
  Filled 2024-01-17: qty 15

## 2024-01-17 MED ORDER — HYPROMELLOSE (GONIOSCOPIC) 2.5 % OP SOLN: 1.0000 [drp] | Freq: Three times a day (TID) | OPHTHALMIC | Status: DC

## 2024-01-17 MED ORDER — NITROFURANTOIN MONOHYD MACRO 100 MG PO CAPS
100.0000 mg | ORAL_CAPSULE | Freq: Two times a day (BID) | ORAL | Status: DC
  Administered 2024-01-17: 100 mg via ORAL
  Filled 2024-01-17 (×3): qty 1

## 2024-01-17 MED ORDER — SODIUM CHLORIDE 0.9 % IV BOLUS
1000.0000 mL | Freq: Once | INTRAVENOUS | Status: AC
  Administered 2024-01-17: 1000 mL via INTRAVENOUS

## 2024-01-17 MED ORDER — GLIPIZIDE ER 10 MG PO TB24
10.0000 mg | ORAL_TABLET | Freq: Every day | ORAL | Status: DC
  Administered 2024-01-18: 10 mg via ORAL
  Filled 2024-01-17: qty 1

## 2024-01-17 MED ORDER — ONDANSETRON HCL 4 MG PO TABS: 4.0000 mg | ORAL_TABLET | Freq: Four times a day (QID) | ORAL | Status: DC | PRN

## 2024-01-17 MED ORDER — FAMOTIDINE 20 MG PO TABS
20.0000 mg | ORAL_TABLET | Freq: Two times a day (BID) | ORAL | Status: DC
  Administered 2024-01-17 – 2024-01-20 (×6): 20 mg via ORAL
  Filled 2024-01-17 (×6): qty 1

## 2024-01-17 MED ORDER — LURASIDONE HCL 40 MG PO TABS
40.0000 mg | ORAL_TABLET | Freq: Every day | ORAL | Status: DC
  Administered 2024-01-18 – 2024-01-19 (×2): 40 mg via ORAL
  Filled 2024-01-17 (×3): qty 1

## 2024-01-17 MED ORDER — GABAPENTIN 100 MG PO CAPS
100.0000 mg | ORAL_CAPSULE | Freq: Two times a day (BID) | ORAL | Status: DC
  Administered 2024-01-17 – 2024-01-20 (×6): 100 mg via ORAL
  Filled 2024-01-17 (×6): qty 1

## 2024-01-17 MED ORDER — BENAZEPRIL HCL 20 MG PO TABS
20.0000 mg | ORAL_TABLET | Freq: Every day | ORAL | Status: DC
  Administered 2024-01-18 – 2024-01-20 (×3): 20 mg via ORAL
  Filled 2024-01-17 (×3): qty 1

## 2024-01-17 MED ORDER — ACETAMINOPHEN 325 MG PO TABS: 650.0000 mg | ORAL_TABLET | Freq: Four times a day (QID) | ORAL | Status: DC | PRN

## 2024-01-17 MED ORDER — LIDOCAINE 5 % EX PTCH
1.0000 | MEDICATED_PATCH | CUTANEOUS | Status: DC
  Filled 2024-01-17 (×3): qty 1

## 2024-01-17 MED ORDER — ENSURE PLUS HIGH PROTEIN PO LIQD
237.0000 mL | Freq: Two times a day (BID) | ORAL | Status: DC
  Administered 2024-01-18 (×2): 237 mL via ORAL

## 2024-01-17 MED ORDER — AZELASTINE HCL 0.1 % NA SOLN
2.0000 | Freq: Two times a day (BID) | NASAL | Status: DC
  Administered 2024-01-17 – 2024-01-20 (×6): 2 via NASAL
  Filled 2024-01-17: qty 30

## 2024-01-17 MED ORDER — SODIUM CHLORIDE 0.9 % IV SOLN: INTRAVENOUS | Status: DC

## 2024-01-17 MED ORDER — ALBUTEROL SULFATE (2.5 MG/3ML) 0.083% IN NEBU: 2.5000 mg | INHALATION_SOLUTION | Freq: Four times a day (QID) | RESPIRATORY_TRACT | Status: DC | PRN

## 2024-01-17 MED ORDER — BUSPIRONE HCL 10 MG PO TABS
30.0000 mg | ORAL_TABLET | Freq: Two times a day (BID) | ORAL | Status: DC
  Administered 2024-01-17 – 2024-01-20 (×6): 30 mg via ORAL
  Filled 2024-01-17: qty 3
  Filled 2024-01-17: qty 6
  Filled 2024-01-17: qty 3
  Filled 2024-01-17: qty 6
  Filled 2024-01-17: qty 3
  Filled 2024-01-17: qty 6

## 2024-01-17 MED ORDER — ONDANSETRON HCL 4 MG/2ML IJ SOLN
4.0000 mg | Freq: Four times a day (QID) | INTRAMUSCULAR | Status: DC | PRN
Start: 2024-01-17 — End: 2024-01-20

## 2024-01-17 MED ORDER — CLOBETASOL PROPIONATE 0.05 % EX CREA
TOPICAL_CREAM | Freq: Two times a day (BID) | CUTANEOUS | Status: DC
  Filled 2024-01-17: qty 15

## 2024-01-17 MED ORDER — TRAZODONE HCL 50 MG PO TABS
150.0000 mg | ORAL_TABLET | Freq: Every day | ORAL | Status: DC
  Administered 2024-01-17 – 2024-01-19 (×3): 150 mg via ORAL
  Filled 2024-01-17 (×3): qty 1

## 2024-01-17 MED ORDER — FLUTICASONE PROPIONATE 50 MCG/ACT NA SUSP
2.0000 | Freq: Every day | NASAL | Status: DC
  Administered 2024-01-18 – 2024-01-20 (×3): 2 via NASAL
  Filled 2024-01-17: qty 16

## 2024-01-17 MED ORDER — HYDROXYZINE HCL 25 MG PO TABS: 12.5000 mg | ORAL_TABLET | Freq: Two times a day (BID) | ORAL | Status: DC | PRN

## 2024-01-17 MED ORDER — LORATADINE 10 MG PO TABS
10.0000 mg | ORAL_TABLET | Freq: Every day | ORAL | Status: DC
  Administered 2024-01-18 – 2024-01-20 (×3): 10 mg via ORAL
  Filled 2024-01-17 (×3): qty 1

## 2024-01-17 MED ORDER — ALBUTEROL SULFATE HFA 108 (90 BASE) MCG/ACT IN AERS: 2.0000 | INHALATION_SPRAY | Freq: Four times a day (QID) | RESPIRATORY_TRACT | Status: DC | PRN

## 2024-01-17 MED ORDER — CLOBETASOL PROPIONATE 0.05 % EX SOLN: Freq: Two times a day (BID) | CUTANEOUS | Status: DC

## 2024-01-17 NOTE — Telephone Encounter (Signed)
 Pt walked in to [facility], [facility], to complete some HIPAA forms so her daughter Valerie Reilly can take care of pts health affairs.   During review of pts chart, it was confirmed that she has [place] Medicaid. Pt and her daughter were advised that she cannot be seen in [facility] as she now has insurance. They were further advised to contact pts PAP or GYN to submit orders for her mammo and to get her scheduled for her PAP smear. Pts daughter expressed understanding of this information.

## 2024-01-17 NOTE — ED Provider Notes (Signed)
  Physical Exam  BP 134/75   Pulse 72   Temp 97.6 F (36.4 C) (Oral)   Resp 16   Ht 5' 6.5 (1.689 m)   Wt 93.9 kg   LMP 04/08/2022 (Approximate)   SpO2 99%   BMI 32.91 kg/m   Physical Exam  Procedures  Procedures  ED Course / MDM    Medical Decision Making Patient care assumed at 3 pm.  Patient is here with lithium  toxicity.  Patient may have accidentally taken extra dose.  Patient is here with dizziness.  4:08 PM Repeat level is 1.88.  I discussed with the pharmacist.  This is not toxic that will require dialysis.  On the other hand, patient will need a repeat level in about 24 hours and may require 2 to 3 days for the level to be back to the therapeutic range from low supratherapeutic range.  At this point patient will be admitted for lithium  toxicity   CRITICAL CARE Performed by: Alm VEAR Cave   Total critical care time: 39 minutes  Critical care time was exclusive of separately billable procedures and treating other patients.  Critical care was necessary to treat or prevent imminent or life-threatening deterioration.  Critical care was time spent personally by me on the following activities: development of treatment plan with patient and/or surrogate as well as nursing, discussions with consultants, evaluation of patient's response to treatment, examination of patient, obtaining history from patient or surrogate, ordering and performing treatments and interventions, ordering and review of laboratory studies, ordering and review of radiographic studies, pulse oximetry and re-evaluation of patient's condition.   Problems Addressed: Lithium  toxicity, accidental or unintentional, initial encounter: acute illness or injury  Amount and/or Complexity of Data Reviewed Labs: ordered. Decision-making details documented in ED Course.          Cave Alm Macho, MD 01/17/24 [phone]

## 2024-01-17 NOTE — ED Provider Notes (Signed)
 [place] EMERGENCY DEPARTMENT AT [facility] Provider Note   CSN: [id number] Arrival date & time: 01/17/24  1302     Patient presents with: Abnormal Lab   Valerie Reilly is a 48 y.o. female.   48 year old female with prior medical history detailed below presents for evaluation.  Patient is on lithium  chronically.  She reports that she had blood drawn yesterday and was told later in the day that her lithium  level was elevated.  She takes 900 mg of lithium  every evening.  She has been on this dose for quite some time for treatment of bipolar.  She denies any intentional overdose.  She admits that sometimes she has trouble remembering when to take medicines or if she has taken her medicines.  She denies nausea or vomiting.  She denies unsteady gait.  She denies tremor.  She denies other complaint.  The history is provided by the patient and medical records.       Prior to Admission medications   Medication Sig Start Date End Date Taking? Authorizing Provider  albuterol  (VENTOLIN  HFA) 108 (90 Base) MCG/ACT inhaler Inhale 2 puffs into the lungs every 6 (six) hours as needed for wheezing or shortness of breath. 06/17/23   Newlin, Enobong, MD  azelastine  (ASTELIN ) 0.1 % nasal spray Place 2 sprays into both nostrils 2 (two) times daily. Use in each nostril as directed 01/05/24   Brien Belvie BRAVO, MD  benazepril  (LOTENSIN ) 20 MG tablet Take 1 tablet (20 mg total) by mouth daily. 01/06/24   Newlin, Enobong, MD  busPIRone  (BUSPAR ) 30 MG tablet Take 1 tablet (30 mg total) by mouth 2 (two) times daily. 11/01/23   Bahraini, Sarah A  cetirizine (ZYRTEC) 10 MG tablet Take 10 mg by mouth daily.    [provider]  cholecalciferol (VITAMIN D ) 1000 UNITS tablet Take 1,000 Units by mouth daily.    [provider]  clobetasol  (TEMOVATE ) 0.05 % external solution Apply 1 (one) mL two times daily to SCALP. after 14 days reduce use to 2 days per week. 09/29/23     clobetasol   cream (TEMOVATE ) 0.05 % Apply 1 (one) Application two times a day, 2 days per week 10/31/23     estradiol  (ESTRACE  VAGINAL) 0.1 MG/GM vaginal cream Place 1 gram vaginally at bedtime. Every night for 2 weeks and then twice a week aftewards 12/14/23   Ajewole, Christana, MD  famotidine  (PEPCID ) 20 MG tablet Take 1 tablet (20 mg total) by mouth 2 (two) times daily. 08/02/23   Fleming, Zelda W, NP  fluocinonide  (LIDEX ) 0.05 % external solution Apply 1 Application topically 2 (two) times daily as needed 10/31/23     fluticasone  (FLONASE ) 50 MCG/ACT nasal spray Place 2 sprays into both nostrils daily. 12/30/23   Fleming, Zelda W, NP  gabapentin  (NEURONTIN ) 100 MG capsule Take 1 capsule (100 mg total) by mouth 2 (two) times daily. 11/01/23   Bahraini, Sarah A  glipiZIDE  (GLUCOTROL  XL) 10 MG 24 hr tablet Take 1 tablet (10 mg total) by mouth daily with breakfast. 01/06/24   Newlin, Enobong, MD  hydroxypropyl methylcellulose / hypromellose (ISOPTO TEARS / GONIOVISC) 2.5 % ophthalmic solution Place 1 drop into both eyes in the morning and at bedtime. 08/02/23   Fleming, Zelda W, NP  hydrOXYzine  (ATARAX ) 25 MG tablet Take 0.5-1 tablets (12.5-25 mg total) by mouth 2 (two) times daily as needed for anxiety. 09/08/23   Bahraini, Sarah A  lidocaine  (LIDODERM ) 5 % Place 1 patch onto the skin  daily. Remove & Discard patch within 12 hours daily, to lower back 10/15/22   Brien Belvie BRAVO, MD  lithium  carbonate 300 MG capsule Take 3 capsules (900 mg total) by mouth at bedtime. 11/01/23 04/29/24  Bahraini, Sarah A  lurasidone  (LATUDA ) 40 MG TABS tablet Take 1 tablet (40 mg total) by mouth daily with supper. Should be taken with food (at least 350 calories). 11/01/23 04/29/24  Bahraini, Sarah A  methotrexate  (RHEUMATREX) 2.5 MG tablet Take 1 tablet by mouth every 12 hours for 3 doses per week. (only 3 tablets per week) 12/22/23     Multiple Vitamin (MULTIVITAMIN) capsule Take 1 capsule by mouth daily.    [provider]   nitrofurantoin , macrocrystal-monohydrate, (MACROBID ) 100 MG capsule Take 1 capsule (100 mg total) by mouth 2 (two) times daily. 01/16/24   Newlin, Enobong, MD  simvastatin  (ZOCOR ) 40 MG tablet Take 1 tablet (40 mg total) by mouth daily. 01/06/24   Newlin, Enobong, MD  traZODone  (DESYREL ) 150 MG tablet Take 1 tablet (150 mg total) by mouth at bedtime. 11/01/23   Bahraini, Sarah A    Allergies: Patient has no known allergies.    Review of Systems  All other systems reviewed and are negative.   Updated Vital Signs BP 134/75   Pulse 72   Temp 97.6 F (36.4 C) (Oral)   Resp 16   Ht 5' 6.5 (1.689 m)   Wt 93.9 kg   LMP 04/08/2022 (Approximate)   SpO2 99%   BMI 32.91 kg/m   Physical Exam Vitals and nursing note reviewed.  Constitutional:      General: She is not in acute distress.    Appearance: Normal appearance. She is well-developed.  HENT:     Head: Normocephalic and atraumatic.  Eyes:     Conjunctiva/sclera: Conjunctivae normal.     Pupils: Pupils are equal, round, and reactive to light.  Cardiovascular:     Rate and Rhythm: Normal rate and regular rhythm.     Heart sounds: Normal heart sounds.  Pulmonary:     Effort: Pulmonary effort is normal. No respiratory distress.     Breath sounds: Normal breath sounds.  Abdominal:     General: There is no distension.     Palpations: Abdomen is soft.     Tenderness: There is no abdominal tenderness.  Musculoskeletal:        General: No deformity. Normal range of motion.     Cervical back: Normal range of motion and neck supple.  Skin:    General: Skin is warm and dry.  Neurological:     General: No focal deficit present.     Mental Status: She is alert and oriented to person, place, and time.     (all labs ordered are listed, but only abnormal results are displayed) Labs Reviewed  LITHIUM  LEVEL  CBC WITH DIFFERENTIAL/PLATELET  COMPREHENSIVE METABOLIC PANEL WITH GFR  URINALYSIS, W/ REFLEX TO CULTURE (INFECTION  SUSPECTED)    EKG: None  Radiology: No results found.   Procedures   Medications Ordered in the ED - No data to display                                  Medical Decision Making Patient presents with complaint of possible lithium  toxicity.  She complains of dizziness.  Screening labs ordered.  Oncoming EDP is aware of case and need for disposition.  Amount and/or Complexity of Data  Reviewed Labs: ordered.  Risk Decision regarding hospitalization.        Final diagnoses:  Lithium  toxicity, accidental or unintentional, initial encounter    ED Discharge Orders     None          Laurice Maude BROCKS, MD 01/28/24 [phone]

## 2024-01-17 NOTE — Telephone Encounter (Signed)
 Hi,   Patient's Daughter Valerie Reilly is [place].

## 2024-01-17 NOTE — Telephone Encounter (Signed)
 Copied from CRM #[id number]. Topic: Clinical - Request for Lab/Test Order >> Jan 17, 2024 12:02 PM Selinda RAMAN wrote:  Reason for CRM: Mitzie the daughter called in requesting an order for a mammogram for her mother. She states she was going to the [facility] on [address] but has been told they only see patients with no insurance and her mother now has medicaid. They told her she would need to get an order from her PCP now for the mammogram. Please assist patient further.

## 2024-01-17 NOTE — ED Triage Notes (Signed)
 Pt ambulatory to triage stating that her PCP sent her to the ER with concern for an elevated Lithium  level. PT states that she sometimes cannot remember if she has taken her meds. PT states that her daughter is now helping with her medications, and has ordered a daily med box.

## 2024-01-17 NOTE — Telephone Encounter (Signed)
 Noted , new DPR uploaded 01/16/25.

## 2024-01-17 NOTE — Telephone Encounter (Signed)
 She is currently under the care of psychiatry-Dr.Bahraini with behavioral health.

## 2024-01-17 NOTE — Telephone Encounter (Signed)
 After the pt and her daughter left, I called them back immediately and spoke to pts daughter because I saw Dr. Millard note regarding pt's need to go to the ER for a critical level of lithium  on her lab results.   Pts daughter advised that she was aware and they were going to go take care of some things to include packing a bag. I reiterated Dr. Isac recommendation and strongly encouraged they to please go to the ER now and worry about getting her a bag of clothes at a later time. Pt daughter expressed understanding of this information and advised that she will take the pt to the ER now.

## 2024-01-17 NOTE — Telephone Encounter (Signed)
 Copied from CRM #[id number]. Topic: General - Other >> Jan 17, 2024 12:05 PM Selinda RAMAN wrote:  Reason for CRM: Valerie Reilly the daughter of the patient just wanted her provider to know she was on the way to [facility] with her mother due to her Lithium  Level being at a critical state. She will update the provider when she gets a chance concerning if she will be admitted or not.

## 2024-01-17 NOTE — H&P (Signed)
 History and Physical    Jenniefer Salak Shepler FMW:[id number] DOB: 1975/12/16 DOA: 01/17/2024  PCP: Theotis Haze ORN, NP (Confirm with patient/family/NH records and if not entered, this has to be entered at [facility] point of entry) Patient coming from: Home  I have personally briefly reviewed patient's old medical records in [facility]  Chief Complaint: Feeling weak, dizzy  HPI: Emberleigh S Blais is a 48 y.o. female with medical history significant of bipolar disorder on lithium  therapy, memory loss, HTN, HLD, obesity, IIDM, sent from PCPs office for possible lithium  toxicity.  I went through patient lithium  dosage along with her pharmacy at [facility], before July of this year, patient takes lithium  600 mg in the morning and 900 mg in the evening and since July of this year, dosage was decreased to 900 mg (3 tbs) only.  Daughter over the phone reported that patient has had significant short memory loss since summer and might have accidentally took more lithium  pills from the bottle.  And the daughter started to use a slotted dispenser since last week with the concern.  Last few days patient started to feel generalized weakness malaise and lightheadedness and went to see PCP who ordered lithium  level, which came back today elevated to 1.7.  PCP called patient and instructed her to come to ED.  Last time patient took lithium  was last night of 3 pills.  Patient was also started on Macrobid for possible UTI since yesterday.  ED Course: Afebrile, nontachycardic and no hypotension, repeat lithium  level 1.88.  Patient was given 1000 mL IV bolus in the ED.  Review of Systems: As per HPI otherwise 14 point review of systems negative.    Past Medical History:  Diagnosis Date   Allergy    Anxiety    Asthma    Bipolar disorder (HCC)    Diabetes mellitus    Diabetes mellitus, type II (HCC)    Gallstone    GERD (gastroesophageal reflux disease)    History of borderline personality disorder     Hypercholesteremia    Hypertension    Obesity    Psoriasis    Psoriasis    Seasonal allergies    Thyroid  disease     Past Surgical History:  Procedure Laterality Date   CHOLECYSTECTOMY N/A 10/02/2021   Procedure: LAPAROSCOPIC CHOLECYSTECTOMY;  Surgeon: Evonnie Dorothyann LABOR, DO;  Location: [facility];  Service: General;  Laterality: N/A;   TONSILLECTOMY     TUBAL LIGATION  2 years ago     reports that she has been smoking cigarettes. She has a 60 pack-year smoking history. She has been exposed to tobacco smoke. She has never used smokeless tobacco. She reports current alcohol use. She reports that she does not use drugs.  No Known Allergies  Family History  Problem Relation Age of Onset   Personality disorder Mother    Diabetes Mother    Diabetes Father    Alcohol abuse Maternal Uncle    Depression Maternal Uncle    Diabetes Maternal Grandfather    Breast cancer Neg Hx    Stomach cancer Neg Hx    Colon cancer Neg Hx    Esophageal cancer Neg Hx    Colon polyps Neg Hx    Crohn's disease Neg Hx    Ulcerative colitis Neg Hx    Rectal cancer Neg Hx     Prior to Admission medications   Medication Sig Start Date End Date Taking? Authorizing Provider  albuterol  (VENTOLIN  HFA) 108 (90 Base)  MCG/ACT inhaler Inhale 2 puffs into the lungs every 6 (six) hours as needed for wheezing or shortness of breath. 06/17/23   Newlin, Enobong, MD  azelastine (ASTELIN) 0.1 % nasal spray Place 2 sprays into both nostrils 2 (two) times daily. Use in each nostril as directed 01/05/24   Brien Belvie BRAVO, MD  benazepril  (LOTENSIN ) 20 MG tablet Take 1 tablet (20 mg total) by mouth daily. 01/06/24   Newlin, Enobong, MD  busPIRone  (BUSPAR ) 30 MG tablet Take 1 tablet (30 mg total) by mouth 2 (two) times daily. 11/01/23   Bahraini, Sarah A  cetirizine (ZYRTEC) 10 MG tablet Take 10 mg by mouth daily.    [provider]  cholecalciferol (VITAMIN D ) 1000 UNITS tablet Take 1,000 Units by mouth daily.     [provider]  clobetasol  (TEMOVATE ) 0.05 % external solution Apply 1 (one) mL two times daily to SCALP. after 14 days reduce use to 2 days per week. 09/29/23     clobetasol  cream (TEMOVATE ) 0.05 % Apply 1 (one) Application two times a day, 2 days per week 10/31/23     estradiol (ESTRACE VAGINAL) 0.1 MG/GM vaginal cream Place 1 gram vaginally at bedtime. Every night for 2 weeks and then twice a week aftewards 12/14/23   Ajewole, Christana, MD  famotidine  (PEPCID ) 20 MG tablet Take 1 tablet (20 mg total) by mouth 2 (two) times daily. 08/02/23   Fleming, Zelda W, NP  fluocinonide  (LIDEX ) 0.05 % external solution Apply 1 Application topically 2 (two) times daily as needed 10/31/23     fluticasone  (FLONASE ) 50 MCG/ACT nasal spray Place 2 sprays into both nostrils daily. 12/30/23   Fleming, Zelda W, NP  gabapentin  (NEURONTIN ) 100 MG capsule Take 1 capsule (100 mg total) by mouth 2 (two) times daily. 11/01/23   Bahraini, Sarah A  glipiZIDE  (GLUCOTROL  XL) 10 MG 24 hr tablet Take 1 tablet (10 mg total) by mouth daily with breakfast. 01/06/24   Newlin, Enobong, MD  hydroxypropyl methylcellulose / hypromellose (ISOPTO TEARS / GONIOVISC) 2.5 % ophthalmic solution Place 1 drop into both eyes in the morning and at bedtime. 08/02/23   Fleming, Zelda W, NP  hydrOXYzine  (ATARAX ) 25 MG tablet Take 0.5-1 tablets (12.5-25 mg total) by mouth 2 (two) times daily as needed for anxiety. 09/08/23   Bahraini, Sarah A  lidocaine  (LIDODERM ) 5 % Place 1 patch onto the skin daily. Remove & Discard patch within 12 hours daily, to lower back 10/15/22   Brien Belvie BRAVO, MD  lithium  carbonate 300 MG capsule Take 3 capsules (900 mg total) by mouth at bedtime. 11/01/23 04/29/24  Bahraini, Sarah A  lurasidone  (LATUDA ) 40 MG TABS tablet Take 1 tablet (40 mg total) by mouth daily with supper. Should be taken with food (at least 350 calories). 11/01/23 04/29/24  Bahraini, Sarah A  methotrexate  (RHEUMATREX) 2.5 MG tablet Take 1 tablet by mouth  every 12 hours for 3 doses per week. (only 3 tablets per week) 12/22/23     Multiple Vitamin (MULTIVITAMIN) capsule Take 1 capsule by mouth daily.    [provider]  nitrofurantoin, macrocrystal-monohydrate, (MACROBID) 100 MG capsule Take 1 capsule (100 mg total) by mouth 2 (two) times daily. 01/16/24   Newlin, Enobong, MD  simvastatin  (ZOCOR ) 40 MG tablet Take 1 tablet (40 mg total) by mouth daily. 01/06/24   Newlin, Enobong, MD  traZODone  (DESYREL ) 150 MG tablet Take 1 tablet (150 mg total) by mouth at bedtime. 11/01/23   Bahraini, Lauraine LABOR  Physical Exam: Vitals:   01/17/24 1308 01/17/24 1315 01/17/24 1609  BP: (!) 163/83 134/75 123/82  Pulse: 71 72 81  Resp: 16  19  Temp: 97.6 F (36.4 C)  98.2 F (36.8 C)  TempSrc: Oral  Oral  SpO2: 100% 99% 99%  Weight: 93.9 kg    Height: 5' 6.5 (1.689 m)      Constitutional: NAD, calm, comfortable Vitals:   01/17/24 1308 01/17/24 1315 01/17/24 1609  BP: (!) 163/83 134/75 123/82  Pulse: 71 72 81  Resp: 16  19  Temp: 97.6 F (36.4 C)  98.2 F (36.8 C)  TempSrc: Oral  Oral  SpO2: 100% 99% 99%  Weight: 93.9 kg    Height: 5' 6.5 (1.689 m)     Eyes: PERRL, lids and conjunctivae normal ENMT: Mucous membranes are moist. Posterior pharynx clear of any exudate or lesions.Normal dentition.  Neck: normal, supple, no masses, no thyromegaly Respiratory: clear to auscultation bilaterally, no wheezing, no crackles. Normal respiratory effort. No accessory muscle use.  Cardiovascular: Regular rate and rhythm, no murmurs / rubs / gallops. No extremity edema. 2+ pedal pulses. No carotid bruits.  Abdomen: no tenderness, no masses palpated. No hepatosplenomegaly. Bowel sounds positive.  Musculoskeletal: no clubbing / cyanosis. No joint deformity upper and lower extremities. Good ROM, no contractures. Normal muscle tone.  Skin: no rashes, lesions, ulcers. No induration Neurologic: CN 2-12 grossly intact. Sensation intact, DTR normal. Strength  5/5 in all 4.  Psychiatric: Normal judgment and insight. Alert and oriented x 3. Normal mood.     Labs on Admission: I have personally reviewed following labs and imaging studies  CBC: Recent Labs  Lab 01/16/24 1655 01/17/24 1409  WBC 14.3* 13.8*  NEUTROABS 9.5* 10.1*  HGB 14.3 13.1  HCT 42.8 39.9  MCV 96 98.0  PLT 399 354   Basic Metabolic Panel: Recent Labs  Lab 01/16/24 1655 01/17/24 1409  NA 138 137  K 4.5 4.1  CL 106 105  CO2 19* 20*  GLUCOSE 154* 230*  BUN 20 21*  CREATININE 1.06* 1.09*  CALCIUM 11.3* 10.9*   GFR: Estimated Creatinine Clearance: 72 mL/min (A) (by C-G formula based on SCr of 1.09 mg/dL (H)). Liver Function Tests: Recent Labs  Lab 01/16/24 1655 01/17/24 1409  AST 18 21  ALT 31 28  ALKPHOS 138* 132*  BILITOT 0.5 0.4  PROT 7.5 7.1  ALBUMIN 4.8 4.5   No results for input(s): LIPASE, AMYLASE in the last 168 hours. No results for input(s): AMMONIA in the last 168 hours. Coagulation Profile: No results for input(s): INR, PROTIME in the last 168 hours. Cardiac Enzymes: No results for input(s): CKTOTAL, CKMB, CKMBINDEX, TROPONINI in the last 168 hours. BNP (last 3 results) No results for input(s): PROBNP in the last 8760 hours. HbA1C: No results for input(s): HGBA1C in the last 72 hours. CBG: No results for input(s): GLUCAP in the last 168 hours. Lipid Profile: No results for input(s): CHOL, HDL, LDLCALC, TRIG, CHOLHDL, LDLDIRECT in the last 72 hours. Thyroid  Function Tests: No results for input(s): TSH, T4TOTAL, FREET4, T3FREE, THYROIDAB in the last 72 hours. Anemia Panel: No results for input(s): VITAMINB12, FOLATE, FERRITIN, TIBC, IRON, RETICCTPCT in the last 72 hours. Urine analysis:    Component Value Date/Time   COLORURINE YELLOW 01/17/2024 1346   APPEARANCEUR HAZY (A) 01/17/2024 1346   APPEARANCEUR Cloudy (A) 12/11/2020 1543   LABSPEC 1.018 01/17/2024 1346   PHURINE  6.0 01/17/2024 1346   GLUCOSEU 50 (A) 01/17/2024 1346  HGBUR NEGATIVE 01/17/2024 1346   BILIRUBINUR NEGATIVE 01/17/2024 1346   BILIRUBINUR small (A) 01/16/2024 1643   BILIRUBINUR negative 10/15/2022 1152   BILIRUBINUR Negative 12/11/2020 1543   KETONESUR NEGATIVE 01/17/2024 1346   PROTEINUR 30 (A) 01/17/2024 1346   UROBILINOGEN 0.2 01/16/2024 1643   UROBILINOGEN 1.0 07/31/2011 2154   NITRITE NEGATIVE 01/17/2024 1346   LEUKOCYTESUR TRACE (A) 01/17/2024 1346    Radiological Exams on Admission: No results found.  EKG: Independently reviewed.  Sinus rhythm, no acute ST changes.  Assessment/Plan Principal Problem:   Lithium  toxicity, accidental or unintentional, initial encounter Active Problems:   Lithium  toxicity  (please populate well all problems here in Problem List. (For example, if patient is on BP meds at home and you resume or decide to hold them, it is a problem that needs to be her. Same for CAD, COPD, HLD and so on)  Lithium  toxicity - Likely secondary to accidental overdose - Discussed with patient and daughter over the phone, options are continue the pill dispenser and pill count, patient and family agreed with the plan. - Pharmacy recommended hold off lithium  for 24-48 hours, and repeat lithium  level tomorrow. - Lithium  level<2.0, no indication for emergency HD.  Short-term memory loss - Mentation at baseline - Patient set up with neurology visit in January  Bipolar disorder - Hold off lithium  - Continue trazodone   HTN - Stable, continue ACEI  IIDM with hyperglycemia - Continue glipizide   Diabetic neuropathy - Gabapentin  100 mg at bedtime  Psoriasis - On methotrexate   DVT prophylaxis: SCD Code Status: Full code Family Communication: Daughter over the phone Disposition Plan: Expect less than 2 midnight hospital stay Consults called: None Admission status: MedSurg observation   Cort ONEIDA Mana MD [facility] Pager [phone]  01/17/2024, 4:50 PM

## 2024-01-17 NOTE — Telephone Encounter (Signed)
 Call received for Valerie Reilly at [facility] to report critical Lithium  level at 1.7. Advised that PCP was aware and patient is being seen a [facility] ED.

## 2024-01-18 ENCOUNTER — Observation Stay (HOSPITAL_COMMUNITY)

## 2024-01-18 DIAGNOSIS — R059 Cough, unspecified: Secondary | ICD-10-CM | POA: Diagnosis not present

## 2024-01-18 DIAGNOSIS — T56891A Toxic effect of other metals, accidental (unintentional), initial encounter: Secondary | ICD-10-CM | POA: Diagnosis not present

## 2024-01-18 LAB — BASIC METABOLIC PANEL WITH GFR
Anion gap: 6 (ref 5–15)
BUN: 17 mg/dL (ref 6–20)
CO2: 20 mmol/L — ABNORMAL LOW (ref 22–32)
Calcium: 10.3 mg/dL (ref 8.9–10.3)
Chloride: 114 mmol/L — ABNORMAL HIGH (ref 98–111)
Creatinine, Ser: 0.87 mg/dL (ref 0.44–1.00)
GFR, Estimated: >60 mL/min (ref >60)
Glucose, Bld: 107 mg/dL — ABNORMAL HIGH (ref 70–99)
Potassium: 3.9 mmol/L (ref 3.5–5.1)
Sodium: 139 mmol/L (ref 135–145)

## 2024-01-18 LAB — HEMOGLOBIN A1C
Hgb A1c MFr Bld: 6.4 % — ABNORMAL HIGH (ref 4.8–5.6)
Mean Plasma Glucose: 136.98 mg/dL

## 2024-01-18 LAB — GLUCOSE, CAPILLARY
Glucose-Capillary: 172 mg/dL — ABNORMAL HIGH (ref 70–99)
Glucose-Capillary: 260 mg/dL — ABNORMAL HIGH (ref 70–99)

## 2024-01-18 LAB — URINE CULTURE

## 2024-01-18 LAB — LITHIUM LEVEL: Lithium Lvl: 1.46 mmol/L — ABNORMAL HIGH (ref 0.60–1.20)

## 2024-01-18 MED ORDER — SODIUM CHLORIDE 0.9 % IV SOLN: INTRAVENOUS | Status: AC

## 2024-01-18 MED ORDER — DOXYCYCLINE HYCLATE 100 MG PO TABS
100.0000 mg | ORAL_TABLET | Freq: Two times a day (BID) | ORAL | Status: DC
  Administered 2024-01-18 – 2024-01-19 (×3): 100 mg via ORAL
  Filled 2024-01-18 (×3): qty 1

## 2024-01-18 MED ORDER — INSULIN ASPART 100 UNIT/ML IJ SOLN
3.0000 [IU] | Freq: Once | INTRAMUSCULAR | Status: AC
  Administered 2024-01-18: 3 [IU] via SUBCUTANEOUS
  Filled 2024-01-18: qty 3

## 2024-01-18 MED ORDER — INSULIN ASPART 100 UNIT/ML IJ SOLN
0.0000 [IU] | Freq: Three times a day (TID) | INTRAMUSCULAR | Status: DC
  Administered 2024-01-18 – 2024-01-19 (×2): 2 [IU] via SUBCUTANEOUS
  Administered 2024-01-19: 7 [IU] via SUBCUTANEOUS
  Administered 2024-01-19: 1 [IU] via SUBCUTANEOUS
  Administered 2024-01-20: 2 [IU] via SUBCUTANEOUS
  Filled 2024-01-18: qty 2
  Filled 2024-01-18: qty 7
  Filled 2024-01-18: qty 1
  Filled 2024-01-18: qty 2

## 2024-01-18 MED ORDER — GUAIFENESIN ER 600 MG PO TB12
1200.0000 mg | ORAL_TABLET | Freq: Two times a day (BID) | ORAL | Status: DC
  Administered 2024-01-18 – 2024-01-20 (×5): 1200 mg via ORAL
  Filled 2024-01-18 (×5): qty 2

## 2024-01-18 MED ORDER — NYSTATIN 100000 UNIT/ML MT SUSP
5.0000 mL | Freq: Four times a day (QID) | OROMUCOSAL | Status: DC
  Administered 2024-01-18 – 2024-01-20 (×9): 500000 [IU] via ORAL
  Filled 2024-01-18 (×9): qty 5

## 2024-01-18 NOTE — Consult Note (Signed)
 This remote consultation was conducted using EMR wound digital image downloaded by the staff member. Pertinent information was reviewed in order to determine recommendations. Coordinated care with primary nurse.  WOC Nurse Consult Note: Reason for Consult:Left under breast open wound Wound type: unknown how it originated, or if patient scratches wound Pressure Injury POA: No Measurement: see flowsheet Wound bed: non-viable tissue Periwound:erythema  Patient currently on a tetracycline antibiotic, recommend xeroform as a non-adherent moist dressing along with Vashe cleanser to reduce surface bioburden. Upon discharge it is recommended patient be followed closely with a wound clinic provider or a PCP.  Dressing procedure/placement/frequency:  Left Breast wound; cleanse with Vashe #[id number] and leave soaking over wound for 10 minutes, pat dry but do not rinse, then place folded Xeroform yellow gauze #[id number], cover with small Mepilex foam or 4x4 gauze taped in place   Secure chat the primary nurse regarding wound culture being advised before dressing is applied, nurse will contact provider.    Breast 01/18/24    WOC will not follow and will remove patient from census task list. Please reconsult if wound worsens in condition and notify provider.   Sherrilyn Hals MSN RN CWOCN WOC [facility]  [phone] (Available from 7-3 pm Mon-Friday)

## 2024-01-18 NOTE — Plan of Care (Signed)
 Patient progressing,  hopes to discharge tomorrow. Mentation good all day.  Waiting on lithium  level to normalize.  Family at bedside all day.

## 2024-01-18 NOTE — Progress Notes (Addendum)
 PROGRESS NOTE    Valerie Reilly  FMW:[id number] DOB: [birth year] DOA: 01/17/2024 PCP: Theotis Haze ORN, NP   Brief Narrative: 48 year old with past medical history significant for bipolar disorder on lithium  therapy memory loss, hypertension, hyperlipidemia, obesity diabetes type 2 sent from PCP office for possible lithium  toxicity.  Patient lithium  dose was recently decreased to 900 mg daily.  Patient has had some memory problems and accidentally she might have taken more lithium  pills than recommended.  Since last week patient has been feeling weak with malaise, lightheadedness.  Lithium  level was elevated at 1.7.  Patient was referred for admission.  Patient was also started on Macrobid for UTI.     Assessment & Plan:   Principal Problem:   Lithium  toxicity, accidental or unintentional, initial encounter Active Problems:   Lithium  toxicity  1-Lithium  toxicity, accidental or unintentional overdose: - Patient presents with dizziness, lightheadedness weakness. - Lithium  level elevated: 1.8--- down today to 1.4 - Psychiatric consulted and recommendation is to resume lithium  at a lower dose 300 mg twice daily, when lithium  level below 0.7   2-recent diagnosis of UTI: UA with only 0-5 white blood cell.  Will discontinue Macrobid. Urine culture: Pending  3-open wound below left breast - She has had this when for some time. -Wound care consulted. -Started doxycycline  4-memory problem: Will check TSH and B12 in the morning.  Bipolar: Continue with trazodone , hold lithium .  Hypertension: Continue ACE  Diabetes type 2 with hyperglycemia: Will hold glipizide  while inpatient, sliding scale insulin  Diabetes  neuropathy: Continue gabapentin   Psoriasis on methotrexate  Check folic acid level Hypercalcemia: In the setting of dehydration resolved with fluids Oral thrush: Resume nystatin Recurrent stye: Follow-up with ophthalmology  Cough: Chest x-ray negative for pneumonia or  bronchitis.  Will start Mucinex  Estimated body mass index is 32.91 kg/m as calculated from the following:   Height as of this encounter: 5' 6.5 (1.689 m).   Weight as of this encounter: 93.9 kg.   DVT prophylaxis: SCDs Code Status: Full code Family Communication: Daughter over the phone Disposition Plan:  Status is: Observation The patient remains OBS appropriate and will d/c before 2 midnights.    Consultants:  Psych  Procedures:  none  Antimicrobials:    Subjective: Patient is alert and conversant, she is feeling better denies worsening dyspnea.  She has been having issues with stye on and off.  I advised her to follow-up with ophthalmology, to use warm compress as needed. She also has a chronic wound below her left breast, plan to start oral antibiotics.  Daughter also reports that patient has been coughing lately.  Also memory problems  Objective: Vitals:   01/17/24 1726 01/17/24 2024 01/18/24 0139 01/18/24 0612  BP: 127/68 (!) 140/80 137/76 120/67  Pulse: 70 78 72 62  Resp: 20 18 18 18   Temp: 97.6 F (36.4 C) 98.1 F (36.7 C) 98 F (36.7 C) 98.1 F (36.7 C)  TempSrc: Oral Oral Oral Oral  SpO2: 100% 100% 99% 100%  Weight:      Height:        Intake/Output Summary (Last 24 hours) at 01/18/2024 1337 Last data filed at 01/18/2024 0800 Gross per 24 hour  Intake 120 ml  Output --  Net 120 ml   Filed Weights   01/17/24 1308  Weight: 93.9 kg    Examination:  General exam: Appears calm and comfortable  Respiratory system: Clear to auscultation. Respiratory effort normal. Cardiovascular system: S1 & S2 heard, RRR. No  JVD, murmurs, rubs, gallops or clicks. No pedal edema. Gastrointestinal system: Abdomen is nondistended, soft and nontender. No organomegaly or masses felt. Normal bowel sounds heard. Central nervous system: Alert and oriented. No focal neurological deficits. Extremities: Symmetric 5 x 5 power. Skin: She has multiple skin lesions consistent  with psoriatic on her left chest breast, and in her mid abdomen.   Data Reviewed: I have personally reviewed following labs and imaging studies  CBC: Recent Labs  Lab 01/16/24 1655 01/17/24 1409  WBC 14.3* 13.8*  NEUTROABS 9.5* 10.1*  HGB 14.3 13.1  HCT 42.8 39.9  MCV 96 98.0  PLT 399 354   Basic Metabolic Panel: Recent Labs  Lab 01/16/24 1655 01/17/24 1409 01/18/24 0430  NA 138 137 139  K 4.5 4.1 3.9  CL 106 105 114*  CO2 19* 20* 20*  GLUCOSE 154* 230* 107*  BUN 20 21* 17  CREATININE 1.06* 1.09* 0.87  CALCIUM 11.3* 10.9* 10.3  MG  --  2.0  --   PHOS  --  3.5  --    GFR: Estimated Creatinine Clearance: 90.3 mL/min (by C-G formula based on SCr of 0.87 mg/dL). Liver Function Tests: Recent Labs  Lab 01/16/24 1655 01/17/24 1409  AST 18 21  ALT 31 28  ALKPHOS 138* 132*  BILITOT 0.5 0.4  PROT 7.5 7.1  ALBUMIN 4.8 4.5   No results for input(s): LIPASE, AMYLASE in the last 168 hours. No results for input(s): AMMONIA in the last 168 hours. Coagulation Profile: No results for input(s): INR, PROTIME in the last 168 hours. Cardiac Enzymes: No results for input(s): CKTOTAL, CKMB, CKMBINDEX, TROPONINI in the last 168 hours. BNP (last 3 results) No results for input(s): PROBNP in the last 8760 hours. HbA1C: No results for input(s): HGBA1C in the last 72 hours. CBG: No results for input(s): GLUCAP in the last 168 hours. Lipid Profile: No results for input(s): CHOL, HDL, LDLCALC, TRIG, CHOLHDL, LDLDIRECT in the last 72 hours. Thyroid  Function Tests: No results for input(s): TSH, T4TOTAL, FREET4, T3FREE, THYROIDAB in the last 72 hours. Anemia Panel: No results for input(s): VITAMINB12, FOLATE, FERRITIN, TIBC, IRON, RETICCTPCT in the last 72 hours. Sepsis Labs: No results for input(s): PROCALCITON, LATICACIDVEN in the last 168 hours.  Recent Results (from the past 240 hours)  Urine Culture     Status:  None (Preliminary result)   Collection Time: 01/16/24  5:11 PM   Specimen: Urine   UR  Result Value Ref Range Status   Urine Culture, Routine Preliminary report  Preliminary   Organism ID, Bacteria Comment  Preliminary    Comment: Microbiological testing to rule out the presence of possible pathogens is in progress. 50,000-100,000 colony forming units per mL          Radiology Studies: DG CHEST PORT 1 VIEW Result Date: 01/18/2024 EXAM: 1 VIEW(S) XRAY OF THE CHEST 01/18/2024 11:07:00 AM COMPARISON: None available. CLINICAL HISTORY: Cough FINDINGS: LUNGS AND PLEURA: No focal pulmonary opacity. No pulmonary edema. No pleural effusion. No pneumothorax. HEART AND MEDIASTINUM: No acute abnormality of the cardiac and mediastinal silhouettes. BONES AND SOFT TISSUES: No acute osseous abnormality. IMPRESSION: 1. No acute cardiopulmonary abnormality. Electronically signed by: Rogelia Myers MD 01/18/2024 11:38 AM EST RP Workstation: [id number]        Scheduled Meds:  artificial tears  1 drop Both Eyes TID   azelastine  2 spray Each Nare BID   benazepril   20 mg Oral Daily   busPIRone   30 mg Oral BID  clobetasol  cream   Topical BID   doxycycline  100 mg Oral Q12H   famotidine   20 mg Oral BID   feeding supplement  237 mL Oral BID BM   fluticasone   2 spray Each Nare Daily   gabapentin   100 mg Oral BID   guaiFENesin  1,200 mg Oral BID   lidocaine   1 patch Transdermal Q24H   loratadine  10 mg Oral Daily   lurasidone   40 mg Oral Q supper   nystatin  5 mL Oral QID   traZODone   150 mg Oral QHS   Continuous Infusions:  sodium chloride  75 mL/hr at 01/18/24 1132     LOS: 0 days    Time spent: 35 Minutes    Abbigail Anstey A Violeta Lecount, MD [facility]   If 7PM-7AM, please contact night-coverage [email or web address]  01/18/2024, 1:37 PM

## 2024-01-18 NOTE — Telephone Encounter (Signed)
 Noted. Will inform patient daughter.

## 2024-01-18 NOTE — Progress Notes (Signed)
   01/18/24 1455  Spiritual Encounters  Type of Visit Initial  Care provided to: Pt and family  Referral source Patient request  Reason for visit Advance directives  OnCall Visit No    Chaplain introduced pt Valerie Reilly and her daughter Valerie Reilly to advance care planning documents. Paperwork and information provided. Afterwards, I facilitated signing/notarization, uploaded the documents to [email or web address], and delivered original and copy to Carli.   No other needs expressed at this time, however chaplains continue to remain available.

## 2024-01-18 NOTE — Telephone Encounter (Signed)
 Unable to reach patients daughter by phone. Voicemail left to return call for results.  Mychart message sent as well.

## 2024-01-18 NOTE — Progress Notes (Signed)
 Mobility Specialist Progress Note:   01/18/24 1516  Mobility  Activity Ambulated independently  Level of Assistance Contact guard assist, steadying assist  Assistive Device None  Distance Ambulated (ft) 215 ft  Activity Response Tolerated well  Mobility Referral Yes  Mobility visit 1 Mobility  Mobility Specialist Start Time (ACUTE ONLY) 1428  Mobility Specialist Stop Time (ACUTE ONLY) 1438  Mobility Specialist Time Calculation (min) (ACUTE ONLY) 10 min   Pt was received in bed and agreed to mobility. Pt had a bout of unsteadiness at the beginning of ambulation, but quickly recovered. No complaints during session. Returned to bed with all needs met and call bell in reach. Left in room with family.  [employer] - Mobility Specialist

## 2024-01-18 NOTE — Consult Note (Signed)
 [facility] Psychiatric Consult Initial  Patient Name: .Valerie Reilly  MRN: [id number]  DOB: [birth year]  Consult Order details:  Orders (From admission, onward)     Start     Ordered   01/18/24 1038  IP CONSULT TO PSYCHIATRY       Ordering Provider: Madelyne Owen LABOR, MD  Provider:  (Not yet assigned)  Question Answer Comment  Location [facility]   Reason for Consult? lithium  toxicity--Medications adjustment      01/18/24 1038             Mode of Visit: In person    Psychiatry Consult Evaluation  Service Date: January 18, 2024 LOS:  LOS: 0 days  Chief Complaint the patient is a 48 year old female with long history of bipolar disorder who has been on lithium  therapy and has a history of mild cognitive impairment, hypertension obesity and type II DM who was admitted for lithium  toxicity with a level of 1.7.  Consult was to evaluate her.  Primary Psychiatric Diagnoses  Bipolar disorder, currently euthymic. 2.  Borderline personality disorder 3.  Mild cognitive impairment 4.  Lithium  toxicity with a level of 1.88  Assessment  Valerie Reilly is a 48 y.o. female admitted: Medicallyfor 01/17/2024  1:05 PM for lithium  toxicity. She carries the psychiatric diagnoses of bipolar disorder and has a past medical history of hypertension mild cognitive impairment.   She was admitted with a lithium  level of 1.88 and some confusion over the frequency of her dosage.  Apparently before July 2025, her dose was 600 mg in the morning and 900 mg in the evening.  However this dose was decreased to 900 mg in the evening only.  It is unclear whether the patient accidentally took extra lithium  but the daughter started noticing that patient was getting progressively more confused and had a significant tremor.  She was seen by her primary care physician with progressively worsening confusion over the last 3 days.  She got worse to the point she was unable to take a shower  Stumbling and walking slowly.  After being seen by Dr. Newlin, she was referred to [facility] for lithium  toxicity.  The lithium  has been discontinued and when seen this morning, the patient was in the room with her daughter.  Daughter reports a significant improvement in her tremor and confusion. When the patient was seen she was alert, oriented and cooperative.  Her speech is of low volume with some latency but no obvious looseness of associations flight of ideas or tangentiality.  She maintained good eye contact and reports that she remains confused as to what really happened she thinks that she did not take extra medication but she is not sure.  She also endorses being on lithium  for over 30 years and not having any symptoms of toxicity before.  Historically patient has done very well on the lithium .  This appears to be an accidental overdose possibly due to mild cognitive impairment and forgetfulness.  Discussed with Dr. Mercy on chat and agree that once patient is stabilized, she can be restarted on very little low-dose of lithium .  Please see full recommendations below   Diagnoses:  Active Hospital problems: Principal Problem:   Lithium  toxicity, accidental or unintentional, initial encounter Active Problems:   Lithium  toxicity    Plan   ## Psychiatric Medication Recommendations:  Hold lithium  until levels are down within the therapeutic range. 2.   Restart lithium  at a low dose, probably 300 mg  twice a day and monitor closely.  ## Medical Decision Making Capacity: Not specifically addressed in this encounter  ## Further Work-up:  -- Per hospitalist EKG -- most recent EKG is from 2023.  Recommend an EKG for QTc -- Pertinent labwork reviewed earlier this admission includes: Daily lithium  levels   ## Disposition:-- There are no psychiatric contraindications to discharge at this time  ## Behavioral / Environmental: -Delirium Precautions: Delirium Interventions for Nursing and  Staff: - RN to open blinds every AM. - To Bedside: Glasses, hearing aide, and pt's own shoes. Make available to patients. when possible and encourage use. - Encourage po fluids when appropriate, keep fluids within reach. - OOB to chair with meals. - Passive ROM exercises to all extremities with AM & PM care. - RN to assess orientation to person, time and place QAM and PRN. - Recommend extended visitation hours with familiar family/friends as feasible. - Staff to minimize disturbances at night. Turn off television when pt asleep or when not in use.    ## Safety and Observation Level:  - Based on my clinical evaluation, I estimate the patient to be at low risk of self harm in the current setting. - At this time, we recommend  routine. This decision is based on my review of the chart including patient's history and current presentation, interview of the patient, mental status examination, and consideration of suicide risk including evaluating suicidal ideation, plan, intent, suicidal or self-harm behaviors, risk factors, and protective factors. This judgment is based on our ability to directly address suicide risk, implement suicide prevention strategies, and develop a safety plan while the patient is in the clinical setting. Please contact our team if there is a concern that risk level has changed.  CSSR Risk Category:C-SSRS RISK CATEGORY: No Risk  Suicide Risk Assessment: Patient has following modifiable risk factors for suicide: under treated depression  and triggering events, which we are addressing by outpatient follow-up appointments. Patient has following non-modifiable or demographic risk factors for suicide: psychiatric hospitalization Patient has the following protective factors against suicide: Access to outpatient mental health care, Supportive family, Supportive friends, and Frustration tolerance  Thank you for this consult request. Recommendations have been communicated to the primary team.   We will continue to follow at this time.   PAULETTE BEETS, MD       History of Present Illness  Relevant Aspects of [facility] Course:  Admitted on 01/17/2024 for lithium  toxicity. They are monitoring her closely.   Patient Report:  The patient was seen and evaluated on 01/18/2024.  She is alert oriented and cooperative.  She maintains good eye contact and reports that her tremor and confusion is improving although she has cognitive impairment at baseline.  When interviewed, patient participated appropriately in her mental status examination.  She is alert oriented cooperative and pleasant.  She denies any current symptoms but continues to obsess over the fact that she is not sure if she took extra lithium  or not.  She denies any active SI/HI/AVH and only endorses mild depression.  She acknowledges mild cognitive impairment with mild memory loss and periodic confusion but denies any long-term memory loss.  Psych ROS:  Depression: History of depression Anxiety: Mild anxiety Mania (lifetime and current): None in the last several years Psychosis: (lifetime and current): Denies  Collateral information:  Contacted from daughter who is present in the room on 01/18/2024  Review of Systems  Psychiatric/Behavioral:  Positive for depression. The patient is nervous/anxious.  Psychiatric and Social History  Psychiatric History:  Information collected from patient and medical records  Prev Dx/Sx: Bipolar disorder and borderline personality disorder Current Psych Provider: Dr. Mercy Home Meds (current): Please see records Previous Med Trials: Please see records Therapy: Has a therapist  Prior Psych Hospitalization: Multiple in the past none recently Prior Self Harm: Denies Prior Violence: Denies  Family Psych History: Denies Family Hx suicide: Denies  Social History:  Patient reports that she lives with her boyfriend of 4 years and her daughter.  Denies any current issues  or conflicts. Access to weapons/lethal means: Denies  Substance History None reported, please see medical records for prior history  Exam Findings  Physical Exam: Per H&P Vital Signs:  Temp:  [97.6 F (36.4 C)-98.2 F (36.8 C)] 98.1 F (36.7 C) (11/12 0612) Pulse Rate:  [62-81] 62 (11/12 0612) Resp:  [18-20] 18 (11/12 0612) BP: (120-140)/(67-82) 120/67 (11/12 0612) SpO2:  [99 %-100 %] 100 % (11/12 0612) Blood pressure 120/67, pulse 62, temperature 98.1 F (36.7 C), temperature source Oral, resp. rate 18, height 5' 6.5 (1.689 m), weight 93.9 kg, last menstrual period 04/08/2022, SpO2 100%. Body mass index is 32.91 kg/m.  Physical Exam Neurological:     Mental Status: She is oriented to person, place, and time. Mental status is at baseline.     Mental Status Exam: General Appearance: Casual  Orientation:  Full (Time, Place, and Person)  Memory:  Immediate;   Fair Recent;   Fair Remote;   Fair  Concentration:  Concentration: Fair and Attention Span: Fair  Recall:  Poor  Attention  Fair  Eye Contact:  Fair  Speech:  Slow  Language:  Good  Volume:  Decreased  Mood: Blunted  Affect:  Restricted  Thought Process:  Linear  Thought Content:  Logical  Suicidal Thoughts:  No  Homicidal Thoughts:  No  Judgement:  Fair  Insight:  Fair  Psychomotor Activity:  Normal  Akathisia:  No  Fund of Knowledge:  Fair      Assets:  Communication Skills Desire for Improvement  Cognition:  Impaired,  Mild  ADL's:  Intact  AIMS (if indicated):        Other History   These have been pulled in through the EMR, reviewed, and updated if appropriate.  Family History:  The patient's family history includes Alcohol abuse in her maternal uncle; Depression in her maternal uncle; Diabetes in her father, maternal grandfather, and mother; Personality disorder in her mother.  Medical History: Past Medical History:  Diagnosis Date   Allergy    Anxiety    Asthma    Bipolar disorder (HCC)     Diabetes mellitus    Diabetes mellitus, type II (HCC)    Gallstone    GERD (gastroesophageal reflux disease)    History of borderline personality disorder    Hypercholesteremia    Hypertension    Obesity    Psoriasis    Psoriasis    Seasonal allergies    Thyroid  disease     Surgical History: Past Surgical History:  Procedure Laterality Date   CHOLECYSTECTOMY N/A 10/02/2021   Procedure: LAPAROSCOPIC CHOLECYSTECTOMY;  Surgeon: Evonnie Dorothyann LABOR, DO;  Location: [facility];  Service: General;  Laterality: N/A;   TONSILLECTOMY     TUBAL LIGATION  2 years ago     Medications:   Current Facility-Administered Medications:    0.9 %  sodium chloride  infusion, , Intravenous, Continuous, Regalado, Belkys A, MD, Last Rate: 75 mL/hr at 01/18/24 1132,  New Bag at 01/18/24 1132   acetaminophen  (TYLENOL ) tablet 650 mg, 650 mg, Oral, Q6H PRN **OR** acetaminophen  (TYLENOL ) suppository 650 mg, 650 mg, Rectal, Q6H PRN, Laurita Cort DASEN, MD   albuterol  (PROVENTIL ) (2.5 MG/3ML) 0.083% nebulizer solution 2.5 mg, 2.5 mg, Nebulization, Q6H PRN, Laurita Cort T, MD   artificial tears ophthalmic solution 1 drop, 1 drop, Both Eyes, TID, Laurita Cort T, MD, 1 drop at 01/18/24 1005   azelastine (ASTELIN) 0.1 % nasal spray 2 spray, 2 spray, Each Nare, BID, Laurita Cort T, MD, 2 spray at 01/18/24 1003   benazepril  (LOTENSIN ) tablet 20 mg, 20 mg, Oral, Daily, Laurita Cort T, MD, 20 mg at 01/18/24 1000   busPIRone  (BUSPAR ) tablet 30 mg, 30 mg, Oral, BID, Zhang, Ping T, MD, 30 mg at 01/18/24 0748   clobetasol  cream (TEMOVATE ) 0.05 %, , Topical, BID, Laurita Cort T, MD, Given at 01/18/24 1004   doxycycline (VIBRA-TABS) tablet 100 mg, 100 mg, Oral, Q12H, Regalado, Belkys A, MD, 100 mg at 01/18/24 1133   famotidine  (PEPCID ) tablet 20 mg, 20 mg, Oral, BID, Zhang, Ping T, MD, 20 mg at 01/18/24 1000   feeding supplement (ENSURE PLUS HIGH PROTEIN) liquid 237 mL, 237 mL, Oral, BID BM, Laurita Cort T, MD, 237 mL at 01/18/24  1003   fluticasone  (FLONASE ) 50 MCG/ACT nasal spray 2 spray, 2 spray, Each Nare, Daily, Laurita Cort T, MD, 2 spray at 01/18/24 1016   gabapentin  (NEURONTIN ) capsule 100 mg, 100 mg, Oral, BID, Zhang, Ping T, MD, 100 mg at 01/18/24 1000   hydrOXYzine  (ATARAX ) tablet 12.5-25 mg, 12.5-25 mg, Oral, BID PRN, Laurita Cort T, MD   lidocaine  (LIDODERM ) 5 % 1 patch, 1 patch, Transdermal, Q24H, Zhang, Cort DASEN, MD   loratadine (CLARITIN) tablet 10 mg, 10 mg, Oral, Daily, Laurita, Ping T, MD, 10 mg at 01/18/24 1000   lurasidone  (LATUDA ) tablet 40 mg, 40 mg, Oral, Q supper, Laurita Cort T, MD   nystatin (MYCOSTATIN) 100000 UNIT/ML suspension 500,000 Units, 5 mL, Oral, QID, Regalado, Belkys A, MD, 500,000 Units at 01/18/24 1133   ondansetron  (ZOFRAN ) tablet 4 mg, 4 mg, Oral, Q6H PRN **OR** ondansetron  (ZOFRAN ) injection 4 mg, 4 mg, Intravenous, Q6H PRN, Laurita Cort T, MD   traZODone  (DESYREL ) tablet 150 mg, 150 mg, Oral, QHS, Laurita Cort T, MD, 150 mg at 01/17/24 2207  Allergies: No Known Allergies  PAULETTE BEETS, MD

## 2024-01-18 NOTE — TOC Initial Note (Signed)
 Transition of Care [facility]) - Initial/Assessment Note    Patient Details  Name: Valerie Reilly MRN: [id number] Date of Birth: January 27, 1976  [facility] CM/SW Contact:    Doneta Glenys DASEN, RN Phone Number: 01/18/2024, 12:21 PM  Clinical Narrative:                 Presented due to critical Lithium  level at 1.7. CM introduced and explained role. Patient sitting up on side of bed and daughter sitting at desk. PTA lives in a mobile home with Adron Shirk (S.O.) [phone] and Mankowski,Chelsea ([phone]; PCP/insurance verified;denies DME, HH,oxygen and SDOH needs. Patients daughter or S.O. will transport at discharge.  Chelsea and patient discussed Proofreader. CM informed them that a consult for Chaplin has been placed and someone will contact them. Explained that it is a process and will need to make sure that they available when the chaplin request to meet. Inpatient care management will follow progression to discharge.  Expected Discharge Plan: Home/Self Care Barriers to Discharge: Continued Medical Work up   Patient Goals and CMS Choice Patient states their goals for this hospitalization and ongoing recovery are:: Home CMS Medicare.gov Compare Post Acute Care list provided to::  (NA) Choice offered to / list presented to : NA [facility] ownership interest in [facility].provided to:: Parent NA    Expected Discharge Plan and Services In-house Referral: Chaplain Discharge Planning Services: CM Consult   Living arrangements for the past 2 months: Mobile Home                 DME Arranged: N/A DME Agency: NA       HH Arranged: NA HH Agency: NA        Prior Living Arrangements/Services Living arrangements for the past 2 months: Mobile Home Lives with:: Adult Children, Significant Other Patient language and need for interpreter reviewed:: Yes Do you feel safe going back to the place where you live?: Yes      Need for Family  Participation in Patient Care: No (Comment) Care giver support system in place?: Yes (comment) Current home services:  (NA) Criminal Activity/Legal Involvement Pertinent to Current Situation/Hospitalization: No - Comment as needed  Activities of Daily Living   ADL Screening (condition at time of admission) Independently performs ADLs?: Yes (appropriate for developmental age) Is the patient deaf or have difficulty hearing?: No Does the patient have difficulty seeing, even when wearing glasses/contacts?: No Does the patient have difficulty concentrating, remembering, or making decisions?: Yes  Permission Sought/Granted Permission sought to share information with : Case Manager Permission granted to share information with : Yes, Verbal Permission Granted  Share Information with NAME: Killian, Ress (Daughter)  [phone] and Adron Shirk (S.O.) [phone]           Emotional Assessment Appearance:: Appears stated age Attitude/Demeanor/Rapport: Engaged Affect (typically observed): Appropriate Orientation: : Oriented to Self, Oriented to Place, Oriented to  Time, Oriented to Situation Alcohol / Substance Use: Not Applicable Psych Involvement: Yes (comment)  Admission diagnosis:  Lithium  toxicity, accidental or unintentional, initial encounter [T56.891A] Patient Active Problem List   Diagnosis Date Noted   Lithium  toxicity 01/17/2024   Lithium  toxicity, accidental or unintentional, initial encounter 01/17/2024   Abscess of skin of breast 01/05/2024   Mild cognitive impairment 11/01/2023   Viral conjunctivitis of right eye 10/26/2023   Vaginitis 12/15/2022   Neuropathy 10/15/2022   High risk heterosexual behavior 10/15/2022   Lumbar back pain with radiculopathy affecting right lower extremity 10/15/2022  Calculus of gallbladder without cholecystitis without obstruction    Bipolar I disorder (HCC) 09/26/2019   GAD (generalized anxiety disorder) 09/26/2019   Insomnia due to  other mental disorder 09/26/2019   Long term use of drug 01/04/2019   Postoperative visit 01/04/2019   Dyslipidemia 07/29/2018   Uncontrolled type 2 diabetes mellitus with hyperglycemia (HCC) 08/18/2016   Cannabis abuse, in remission 08/15/2014   Acquired hypothyroidism 06/25/2014   Essential hypertension 06/25/2014   Borderline personality disorder (HCC) 04/30/2014   Allergic rhinitis 03/30/2012   GERD (gastroesophageal reflux disease) 03/30/2012   Psoriasis 03/30/2012   Vitamin D  deficiency 03/30/2012   PCP:  Theotis Haze ORN, NP Pharmacy:   [facility] [id number] [facility] [address] [place] [address] Phone: [phone] Fax: [phone]     Social Drivers of Health (SDOH) Social History: SDOH Screenings   Food Insecurity: No Food Insecurity (01/17/2024)  Recent Concern: Food Insecurity - Food Insecurity Present (12/16/2023)  Housing: Low Risk  (01/17/2024)  Transportation Needs: No Transportation Needs (01/17/2024)  Utilities: Not At Risk (01/17/2024)  Recent Concern: Utilities - At Risk (12/01/2023)  Alcohol Screen: Low Risk  (06/06/2022)  Depression (PHQ2-9): Low Risk  (01/10/2024)  Financial Resource Strain: High Risk (12/01/2023)  Physical Activity: Inactive (12/15/2022)  Social Connections: Socially Isolated (12/15/2022)  Stress: No Stress Concern Present (12/15/2022)  Tobacco Use: High Risk (01/16/2024)  Health Literacy: Adequate Health Literacy (12/15/2022)   SDOH Interventions:     Readmission Risk Interventions     No data to display

## 2024-01-19 ENCOUNTER — Telehealth: Admitting: *Deleted

## 2024-01-19 ENCOUNTER — Encounter (HOSPITAL_COMMUNITY): Payer: Self-pay | Admitting: Internal Medicine

## 2024-01-19 DIAGNOSIS — F319 Bipolar disorder, unspecified: Secondary | ICD-10-CM | POA: Diagnosis not present

## 2024-01-19 DIAGNOSIS — E860 Dehydration: Secondary | ICD-10-CM | POA: Diagnosis present

## 2024-01-19 DIAGNOSIS — R531 Weakness: Secondary | ICD-10-CM | POA: Diagnosis present

## 2024-01-19 DIAGNOSIS — J45909 Unspecified asthma, uncomplicated: Secondary | ICD-10-CM | POA: Diagnosis present

## 2024-01-19 DIAGNOSIS — T56891A Toxic effect of other metals, accidental (unintentional), initial encounter: Secondary | ICD-10-CM | POA: Diagnosis not present

## 2024-01-19 DIAGNOSIS — I10 Essential (primary) hypertension: Secondary | ICD-10-CM | POA: Diagnosis present

## 2024-01-19 DIAGNOSIS — E7800 Pure hypercholesterolemia, unspecified: Secondary | ICD-10-CM | POA: Diagnosis present

## 2024-01-19 DIAGNOSIS — H00019 Hordeolum externum unspecified eye, unspecified eyelid: Secondary | ICD-10-CM | POA: Diagnosis present

## 2024-01-19 DIAGNOSIS — G3184 Mild cognitive impairment, so stated: Secondary | ICD-10-CM

## 2024-01-19 DIAGNOSIS — T43591A Poisoning by other antipsychotics and neuroleptics, accidental (unintentional), initial encounter: Secondary | ICD-10-CM | POA: Diagnosis present

## 2024-01-19 DIAGNOSIS — F17210 Nicotine dependence, cigarettes, uncomplicated: Secondary | ICD-10-CM | POA: Diagnosis present

## 2024-01-19 DIAGNOSIS — Z79899 Other long term (current) drug therapy: Secondary | ICD-10-CM | POA: Diagnosis not present

## 2024-01-19 DIAGNOSIS — Z818 Family history of other mental and behavioral disorders: Secondary | ICD-10-CM | POA: Diagnosis not present

## 2024-01-19 DIAGNOSIS — E1140 Type 2 diabetes mellitus with diabetic neuropathy, unspecified: Secondary | ICD-10-CM | POA: Diagnosis present

## 2024-01-19 DIAGNOSIS — F419 Anxiety disorder, unspecified: Secondary | ICD-10-CM | POA: Diagnosis present

## 2024-01-19 DIAGNOSIS — B370 Candidal stomatitis: Secondary | ICD-10-CM | POA: Diagnosis present

## 2024-01-19 DIAGNOSIS — Z6832 Body mass index (BMI) 32.0-32.9, adult: Secondary | ICD-10-CM | POA: Diagnosis not present

## 2024-01-19 DIAGNOSIS — Z716 Tobacco abuse counseling: Secondary | ICD-10-CM | POA: Diagnosis not present

## 2024-01-19 DIAGNOSIS — Z7984 Long term (current) use of oral hypoglycemic drugs: Secondary | ICD-10-CM | POA: Diagnosis not present

## 2024-01-19 DIAGNOSIS — E039 Hypothyroidism, unspecified: Secondary | ICD-10-CM | POA: Diagnosis present

## 2024-01-19 DIAGNOSIS — L409 Psoriasis, unspecified: Secondary | ICD-10-CM | POA: Diagnosis present

## 2024-01-19 DIAGNOSIS — F603 Borderline personality disorder: Secondary | ICD-10-CM | POA: Diagnosis not present

## 2024-01-19 DIAGNOSIS — E669 Obesity, unspecified: Secondary | ICD-10-CM | POA: Diagnosis present

## 2024-01-19 DIAGNOSIS — E1165 Type 2 diabetes mellitus with hyperglycemia: Secondary | ICD-10-CM | POA: Diagnosis present

## 2024-01-19 DIAGNOSIS — N390 Urinary tract infection, site not specified: Secondary | ICD-10-CM | POA: Diagnosis present

## 2024-01-19 LAB — RPR: RPR Ser Ql: NONREACTIVE

## 2024-01-19 LAB — GLUCOSE, CAPILLARY
Glucose-Capillary: 316 mg/dL — ABNORMAL HIGH (ref 70–99)
Glucose-Capillary: 167 mg/dL — ABNORMAL HIGH (ref 70–99)
Glucose-Capillary: 129 mg/dL — ABNORMAL HIGH (ref 70–99)
Glucose-Capillary: 150 mg/dL — ABNORMAL HIGH (ref 70–99)

## 2024-01-19 LAB — FOLATE: Folate: >20 ng/mL (ref >5.9)

## 2024-01-19 LAB — LITHIUM LEVEL: Lithium Lvl: 0.9 mmol/L (ref 0.60–1.20)

## 2024-01-19 LAB — TSH: TSH: 5.92 u[IU]/mL — ABNORMAL HIGH (ref 0.350–4.500)

## 2024-01-19 LAB — VITAMIN B12: Vitamin B-12: 539 pg/mL (ref 180–914)

## 2024-01-19 MED ORDER — NICOTINE 21 MG/24HR TD PT24: 21.0000 mg | MEDICATED_PATCH | Freq: Every day | TRANSDERMAL | Status: DC | PRN

## 2024-01-19 MED ORDER — AMOXICILLIN-POT CLAVULANATE 875-125 MG PO TABS
1.0000 | ORAL_TABLET | Freq: Two times a day (BID) | ORAL | Status: DC
  Administered 2024-01-19 – 2024-01-20 (×3): 1 via ORAL
  Filled 2024-01-19 (×3): qty 1

## 2024-01-19 MED ORDER — CIPROFLOXACIN HCL 500 MG PO TABS
500.0000 mg | ORAL_TABLET | Freq: Two times a day (BID) | ORAL | Status: DC
  Administered 2024-01-19 – 2024-01-20 (×3): 500 mg via ORAL
  Filled 2024-01-19 (×3): qty 1

## 2024-01-19 MED ORDER — SODIUM CHLORIDE 0.9 % IV SOLN: INTRAVENOUS | Status: DC

## 2024-01-19 MED ORDER — BACITRACIN-POLYMYXIN B 500-10000 UNIT/GM OP OINT
TOPICAL_OINTMENT | Freq: Three times a day (TID) | OPHTHALMIC | Status: DC
  Filled 2024-01-19: qty 3.5

## 2024-01-19 NOTE — Plan of Care (Signed)

## 2024-01-19 NOTE — Progress Notes (Signed)
 PROGRESS NOTE    Valerie Reilly  FMW:[id number] DOB: 17-Nov-1975 DOA: 01/17/2024 PCP: Theotis Haze ORN, NP   Brief Narrative: 48 year old with past medical history significant for bipolar disorder on lithium  therapy memory loss, hypertension, hyperlipidemia, obesity diabetes type 2 sent from PCP office for possible lithium  toxicity.  Patient lithium  dose was recently decreased to 900 mg daily.  Patient has had some memory problems and accidentally she might have taken more lithium  pills than recommended.  Since last week patient has been feeling weak with malaise, lightheadedness.  Lithium  level was elevated at 1.7.  Patient was referred for admission.  Patient was also started on Macrobid for UTI.     Assessment & Plan:   Principal Problem:   Lithium  toxicity, accidental or unintentional, initial encounter Active Problems:   Lithium  toxicity  1-Lithium  toxicity, accidental or unintentional overdose: - Patient presents with dizziness, lightheadedness weakness. - Lithium  level elevated: 1.8--- down today to 1.4 - Psychiatric consulted and recommendation is to resume lithium  at a lower dose 300 mg twice daily, when lithium  level below 0.6 Level at 0.9- plan to resume IV fluids, repeat levels tomorrow, if below 0.6 then start Lithium  BID>   2-recent diagnosis of UTI: UA with only 0-5 white blood cell.  Will discontinue Macrobid. Urine culture: Enterococcus Faecalis.  On Cipro for 3 days.   3-open wound below left breast - She has had this when for some time. -Wound care consulted. Culture growing streptococcus agalactiae.  -Change Doxy to Augmentin .   4-memory problem:  TSH mildly elevated at 5, check free T 3 and Free T 4  and B12 normal 539  Bipolar: Continue with trazodone , hold lithium .  Hypertension: Continue ACE  Diabetes type 2 with hyperglycemia: Will hold glipizide  while inpatient, sliding scale insulin  Diabetes  neuropathy: Continue gabapentin   Psoriasis on  methotrexate  Folic acid 20 Hypercalcemia: In the setting of dehydration resolved with fluids Oral thrush: start  nystatin Recurrent stye: Follow-up with ophthalmology  Cough: Chest x-ray negative for pneumonia or bronchitis.  On  Mucinex Counseling regarding smoking cessation.   Estimated body mass index is 32.91 kg/m as calculated from the following:   Height as of this encounter: 5' 6.5 (1.689 m).   Weight as of this encounter: 93.9 kg.   DVT prophylaxis: SCDs Code Status: Full code Family Communication: Daughter over the phone Disposition Plan:  Status is: Observation The patient remains OBS appropriate and will d/c before 2 midnights.    Consultants:  Psych  Procedures:  none  Antimicrobials:    Subjective: Patient with no new complaints.  Had some tremors 2 months ago.  She is feeling better.  Sty appears more edematous.  Family with multiples question, that were answer.   Objective: Vitals:   01/18/24 1405 01/18/24 1936 01/19/24 0537 01/19/24 1304  BP: 124/89 112/80 (!) 150/87 (!) 175/93  Pulse: 75 65 64 66  Resp: 16   16  Temp: 97.9 F (36.6 C) 97.7 F (36.5 C) 97.7 F (36.5 C) 97.8 F (36.6 C)  TempSrc:  Oral Oral Oral  SpO2: 98% 100% 100% 100%  Weight:      Height:        Intake/Output Summary (Last 24 hours) at 01/19/2024 1420 Last data filed at 01/19/2024 0600 Gross per 24 hour  Intake 1494.93 ml  Output --  Net 1494.93 ml   Filed Weights   01/17/24 1308  Weight: 93.9 kg    Examination:  General exam: NAD Respiratory system: CTA Cardiovascular  system: S 1, S 2 RRR Gastrointestinal system: BS present, soft, nt Central nervous system: alert, conversant.  Skin: She has multiple skin lesions consistent with psoriatic on her left chest breast, and in her mid abdomen.   Data Reviewed: I have personally reviewed following labs and imaging studies  CBC: Recent Labs  Lab 01/16/24 1655 01/17/24 1409  WBC 14.3* 13.8*  NEUTROABS  9.5* 10.1*  HGB 14.3 13.1  HCT 42.8 39.9  MCV 96 98.0  PLT 399 354   Basic Metabolic Panel: Recent Labs  Lab 01/16/24 1655 01/17/24 1409 01/18/24 0430  NA 138 137 139  K 4.5 4.1 3.9  CL 106 105 114*  CO2 19* 20* 20*  GLUCOSE 154* 230* 107*  BUN 20 21* 17  CREATININE 1.06* 1.09* 0.87  CALCIUM 11.3* 10.9* 10.3  MG  --  2.0  --   PHOS  --  3.5  --    GFR: Estimated Creatinine Clearance: 90.3 mL/min (by C-G formula based on SCr of 0.87 mg/dL). Liver Function Tests: Recent Labs  Lab 01/16/24 1655 01/17/24 1409  AST 18 21  ALT 31 28  ALKPHOS 138* 132*  BILITOT 0.5 0.4  PROT 7.5 7.1  ALBUMIN 4.8 4.5   No results for input(s): LIPASE, AMYLASE in the last 168 hours. No results for input(s): AMMONIA in the last 168 hours. Coagulation Profile: No results for input(s): INR, PROTIME in the last 168 hours. Cardiac Enzymes: No results for input(s): CKTOTAL, CKMB, CKMBINDEX, TROPONINI in the last 168 hours. BNP (last 3 results) No results for input(s): PROBNP in the last 8760 hours. HbA1C: Recent Labs    01/18/24 1356  HGBA1C 6.4*   CBG: Recent Labs  Lab 01/18/24 1636 01/18/24 2129 01/19/24 0833 01/19/24 1215  GLUCAP 172* 260* 316* 167*   Lipid Profile: No results for input(s): CHOL, HDL, LDLCALC, TRIG, CHOLHDL, LDLDIRECT in the last 72 hours. Thyroid  Function Tests: Recent Labs    01/19/24 0433  TSH 5.920*   Anemia Panel: Recent Labs    01/19/24 0433  VITAMINB12 539  FOLATE >20.0   Sepsis Labs: No results for input(s): PROCALCITON, LATICACIDVEN in the last 168 hours.  Recent Results (from the past 240 hours)  Urine Culture     Status: Abnormal   Collection Time: 01/16/24  5:11 PM   Specimen: Urine   UR  Result Value Ref Range Status   Urine Culture, Routine Final report (A)  Final   Organism ID, Bacteria Enterococcus faecalis (A)  Final    Comment: Enterococci susceptible to penicillin are predictably  susceptible to ampicillin, amoxicillin , ampicillin-sulbactam, amoxicillin -clavulanate, and piperacillin-tazobactam for non-beta-lactamase producing enterococci. (CLSI 2018) For Enterococcus species, aminoglycosides (except for high-level resistance screening), cephalosporins, clindamycin, and trimethoprim-sulfamethoxazole are not effective clinically. (CLSI, M100-S26, 2016) 50,000-100,000 colony forming units per mL    Antimicrobial Susceptibility Comment  Final    Comment:       ** S = Susceptible; I = Intermediate; R = Resistant **                    P = Positive; N = Negative             MICS are expressed in micrograms per mL    Antibiotic                 RSLT#1    RSLT#2    RSLT#3    RSLT#4 Ciprofloxacin  S Levofloxacin                   S Nitrofurantoin                 S Penicillin                     S Tetracycline                   R Vancomycin                     S   Aerobic Culture w Gram Stain (superficial specimen)     Status: None (Preliminary result)   Collection Time: 01/18/24  2:00 PM   Specimen: Breast; Wound  Result Value Ref Range Status   Specimen Description   Final    BREAST Performed at [facility], [address] [place], [place] [address]    Special Requests   Final    NONE Performed at [facility], [address] [place], [place] [address]    Gram Stain NO WBC SEEN FEW GRAM POSITIVE COCCI   Final   Culture   Final    ABUNDANT GROUP B STREP(S.AGALACTIAE)ISOLATED TESTING AGAINST S. AGALACTIAE NOT ROUTINELY PERFORMED DUE TO PREDICTABILITY OF AMP/PEN/VAN SUSCEPTIBILITY. Performed at [facility], [address], [place], [place] [address]    Report Status PENDING  Incomplete         Radiology Studies: DG CHEST PORT 1 VIEW Result Date: 01/18/2024 EXAM: 1 VIEW(S) XRAY OF THE CHEST 01/18/2024 11:07:00 AM COMPARISON: None available. CLINICAL HISTORY: Cough FINDINGS: LUNGS AND PLEURA:  No focal pulmonary opacity. No pulmonary edema. No pleural effusion. No pneumothorax. HEART AND MEDIASTINUM: No acute abnormality of the cardiac and mediastinal silhouettes. BONES AND SOFT TISSUES: No acute osseous abnormality. IMPRESSION: 1. No acute cardiopulmonary abnormality. Electronically signed by: Rogelia Myers MD 01/18/2024 11:38 AM EST RP Workstation: [id number]        Scheduled Meds:  amoxicillin -clavulanate  1 tablet Oral Q12H   artificial tears  1 drop Both Eyes TID   azelastine  2 spray Each Nare BID   benazepril   20 mg Oral Daily   busPIRone   30 mg Oral BID   ciprofloxacin  500 mg Oral BID   clobetasol  cream   Topical BID   famotidine   20 mg Oral BID   feeding supplement  237 mL Oral BID BM   fluticasone   2 spray Each Nare Daily   gabapentin   100 mg Oral BID   guaiFENesin  1,200 mg Oral BID   insulin aspart  0-9 Units Subcutaneous TID WC   lidocaine   1 patch Transdermal Q24H   loratadine  10 mg Oral Daily   lurasidone   40 mg Oral Q supper   nystatin  5 mL Oral QID   traZODone   150 mg Oral QHS   Continuous Infusions:  sodium chloride  100 mL/hr at 01/19/24 1256     LOS: 0 days    Time spent: 35 Minutes    Jennetta Flood A Rosalea Withrow, MD [facility]   If 7PM-7AM, please contact night-coverage [email or web address]  01/19/2024, 2:20 PM

## 2024-01-19 NOTE — Inpatient Diabetes Management (Signed)
 Inpatient Diabetes Program Recommendations  AACE/ADA: New Consensus Statement on Inpatient Glycemic Control (2015)  Target Ranges:  Prepandial:   less than 140 mg/dL      Peak postprandial:   less than 180 mg/dL (1-2 hours)      Critically ill patients:  140 - 180 mg/dL   Lab Results  Component Value Date   GLUCAP 167 (H) 01/19/2024   HGBA1C 6.4 (H) 01/18/2024    Review of Glycemic Control  Latest Reference Range & Units 01/18/24 16:36 01/18/24 21:29 01/19/24 08:33 01/19/24 12:15  Glucose-Capillary 70 - 99 mg/dL 827 (H) 739 (H) 683 (H) 167 (H)   Diabetes history: DM 2 Outpatient Diabetes medications:  Metformin  1000 mg bid Glucotrol  XL 10 mg daily Current orders for Inpatient glycemic control:  Novolog 0-9 units tid with meals   Inpatient Diabetes Program Recommendations:    If fasting remains >150 mg/dL, consider adding Semglee 10 units daily while oral DM medications on hold.   Thanks,  Randall Bullocks, RN, BC-ADM Inpatient Diabetes Coordinator Pager [phone]  (8a-5p)

## 2024-01-19 NOTE — Consult Note (Signed)
 [facility] Follow-up  Patient Name: .Valerie Reilly  MRN: [id number]  DOB: 11-05-1975  Consult Order details:  Orders (From admission, onward)     Start     Ordered   01/18/24 1038  IP CONSULT TO PSYCHIATRY       Ordering Provider: Madelyne Owen LABOR, MD  Provider:  (Not yet assigned)  Question Answer Comment  Location [facility]   Reason for Consult? lithium  toxicity--Medications adjustment      01/18/24 1038             Mode of Visit: In person    Psychiatry Consult Evaluation  Service Date: January 19, 2024 LOS:  LOS: 0 days  Chief Complaint the patient is a 48 year old female with long history of bipolar disorder who has been on lithium  therapy and has a history of mild cognitive impairment, hypertension obesity and type II DM who was admitted for lithium  toxicity with a level of 1.7.  Consult was to evaluate her.  Primary Psychiatric Diagnoses  Bipolar disorder, currently euthymic. 2.  Borderline personality disorder 3.  Mild cognitive impairment 4.  Lithium  toxicity with a level of 1.88  Assessment  Valerie Reilly is a 49 y.o. female admitted: Medicallyfor 01/17/2024  1:05 PM for lithium  toxicity. She carries the psychiatric diagnoses of bipolar disorder and has a past medical history of hypertension mild cognitive impairment.   She was admitted with a lithium  level of 1.88 and some confusion over the frequency of her dosage.  Apparently before July 2025, her dose was 600 mg in the morning and 900 mg in the evening.  However this dose was decreased to 900 mg in the evening only.  It is unclear whether the patient accidentally took extra lithium  but the daughter started noticing that patient was getting progressively more confused and had a significant tremor.  She was seen by her primary care physician with progressively worsening confusion over the last 3 days.  She got worse to the point she was unable to take a shower  Stumbling and walking slowly.  After being seen by Dr. Newlin, she was referred to [facility] for lithium  toxicity.  The lithium  has been discontinued and when seen this morning, the patient was in the room with her daughter.  Daughter reports a significant improvement in her tremor and confusion. When the patient was seen she was alert, oriented and cooperative.  Her speech is of low volume with some latency but no obvious looseness of associations flight of ideas or tangentiality.  She maintained good eye contact and reports that she remains confused as to what really happened she thinks that she did not take extra medication but she is not sure.  She also endorses being on lithium  for over 30 years and not having any symptoms of toxicity before.  Historically patient has done very well on the lithium .  This appears to be an accidental overdose possibly due to mild cognitive impairment and forgetfulness.  Discussed with Dr. Mercy on chat and agree that once patient is stabilized, she can be restarted on very little low-dose of lithium .  Please see full recommendations below  01/19/2024: The patient was seen and reevaluated today.  Her mother was present in the room.  Patient reports episodic tremor which is improving and confusion also improving.  She still has some hesitancy about her dosage and agrees for her daughter to monitor her medications but she would still like to have control of them  and manage them on her own. Lithium  level is 0.9.  TSH is elevated to 5.920. Diagnoses:  Active Hospital problems: Principal Problem:   Lithium  toxicity, accidental or unintentional, initial encounter Active Problems:   Lithium  toxicity    Plan   ## Psychiatric Medication Recommendations:  Today the lithium  level is 0.9.  Will continue to hold the lithium  until the level is at least 0.6 or below and then restart at a lower dose.  Anticipate this to happen tomorrow. Consider treatment for  hypothyroidism if indicated.  ## Medical Decision Making Capacity: Not specifically addressed in this encounter  ## Further Work-up:  -- Per hospitalist EKG -- most recent EKG is from 2023.  Recommend an EKG for QTc -- Pertinent labwork reviewed earlier this admission includes: Daily lithium  levels   ## Disposition:-- There are no psychiatric contraindications to discharge at this time  ## Behavioral / Environmental: -Delirium Precautions: Delirium Interventions for Nursing and Staff: - RN to open blinds every AM. - To Bedside: Glasses, hearing aide, and pt's own shoes. Make available to patients. when possible and encourage use. - Encourage po fluids when appropriate, keep fluids within reach. - OOB to chair with meals. - Passive ROM exercises to all extremities with AM & PM care. - RN to assess orientation to person, time and place QAM and PRN. - Recommend extended visitation hours with familiar family/friends as feasible. - Staff to minimize disturbances at night. Turn off television when pt asleep or when not in use.    ## Safety and Observation Level:  - Based on my clinical evaluation, I estimate the patient to be at low risk of self harm in the current setting. - At this time, we recommend  routine. This decision is based on my review of the chart including patient's history and current presentation, interview of the patient, mental status examination, and consideration of suicide risk including evaluating suicidal ideation, plan, intent, suicidal or self-harm behaviors, risk factors, and protective factors. This judgment is based on our ability to directly address suicide risk, implement suicide prevention strategies, and develop a safety plan while the patient is in the clinical setting. Please contact our team if there is a concern that risk level has changed.  CSSR Risk Category:C-SSRS RISK CATEGORY: No Risk  Suicide Risk Assessment: Patient has following modifiable risk factors for  suicide: under treated depression  and triggering events, which we are addressing by outpatient follow-up appointments. Patient has following non-modifiable or demographic risk factors for suicide: psychiatric hospitalization Patient has the following protective factors against suicide: Access to outpatient mental health care, Supportive family, Supportive friends, and Frustration tolerance  Thank you for this consult request. Recommendations have been communicated to the primary team.  We will continue to follow at this time.   PAULETTE BEETS, MD       History of Present Illness  Relevant Aspects of [facility] Course:  Admitted on 01/17/2024 for lithium  toxicity. They are monitoring her closely.   Patient Report:  The patient was seen and evaluated on 01/18/2024.  She is alert oriented and cooperative.  She maintains good eye contact and reports that her tremor and confusion is improving although she has cognitive impairment at baseline.  When interviewed, patient participated appropriately in her mental status examination.  She is alert oriented cooperative and pleasant.  She denies any current symptoms but continues to obsess over the fact that she is not sure if she took extra lithium  or not.  She denies any  active SI/HI/AVH and only endorses mild depression.  She acknowledges mild cognitive impairment with mild memory loss and periodic confusion but denies any long-term memory loss.  Psych ROS:  Depression: History of depression Anxiety: Mild anxiety Mania (lifetime and current): None in the last several years Psychosis: (lifetime and current): Denies  Collateral information:  Contacted from daughter who is present in the room on 01/18/2024  Review of Systems  Psychiatric/Behavioral:  Positive for depression. The patient is nervous/anxious.      Psychiatric and Social History  Psychiatric History:  Information collected from patient and medical records  Prev Dx/Sx:  Bipolar disorder and borderline personality disorder Current Psych Provider: Dr. Mercy Home Meds (current): Please see records Previous Med Trials: Please see records Therapy: Has a therapist  Prior Psych Hospitalization: Multiple in the past none recently Prior Self Harm: Denies Prior Violence: Denies  Family Psych History: Denies Family Hx suicide: Denies  Social History:  Patient reports that she lives with her boyfriend of 4 years and her daughter.  Denies any current issues or conflicts. Access to weapons/lethal means: Denies  Substance History None reported, please see medical records for prior history  Exam Findings  Physical Exam: Per H&P Vital Signs:  Temp:  [97.7 F (36.5 C)-97.9 F (36.6 C)] 97.7 F (36.5 C) (11/13 0537) Pulse Rate:  [64-75] 64 (11/13 0537) Resp:  [16] 16 (11/12 1405) BP: (112-150)/(80-89) 150/87 (11/13 0537) SpO2:  [98 %-100 %] 100 % (11/13 0537) Blood pressure (!) 150/87, pulse 64, temperature 97.7 F (36.5 C), temperature source Oral, resp. rate 16, height 5' 6.5 (1.689 m), weight 93.9 kg, last menstrual period 04/08/2022, SpO2 100%. Body mass index is 32.91 kg/m.  Physical Exam Neurological:     Mental Status: She is oriented to person, place, and time. Mental status is at baseline.     Mental Status Exam: General Appearance: Casual  Orientation:  Full (Time, Place, and Person)  Memory:  Immediate;   Fair Recent;   Fair Remote;   Fair  Concentration:  Concentration: Fair and Attention Span: Fair  Recall:  Poor  Attention  Fair  Eye Contact:  Fair  Speech:  Slow  Language:  Good  Volume:  Decreased  Mood: Blunted  Affect:  Restricted  Thought Process:  Linear  Thought Content:  Logical  Suicidal Thoughts:  No  Homicidal Thoughts:  No  Judgement:  Fair  Insight:  Fair  Psychomotor Activity:  Normal  Akathisia:  No  Fund of Knowledge:  Fair      Assets:  Communication Skills Desire for Improvement  Cognition:   Impaired,  Mild  ADL's:  Intact  AIMS (if indicated):        Other History   These have been pulled in through the EMR, reviewed, and updated if appropriate.  Family History:  The patient's family history includes Alcohol abuse in her maternal uncle; Depression in her maternal uncle; Diabetes in her father, maternal grandfather, and mother; Personality disorder in her mother.  Medical History: Past Medical History:  Diagnosis Date   Allergy    Anxiety    Asthma    Bipolar disorder (HCC)    Diabetes mellitus    Diabetes mellitus, type II (HCC)    Gallstone    GERD (gastroesophageal reflux disease)    History of borderline personality disorder    Hypercholesteremia    Hypertension    Obesity    Psoriasis    Psoriasis    Seasonal allergies  Thyroid  disease     Surgical History: Past Surgical History:  Procedure Laterality Date   CHOLECYSTECTOMY N/A 10/02/2021   Procedure: LAPAROSCOPIC CHOLECYSTECTOMY;  Surgeon: Evonnie Dorothyann LABOR, DO;  Location: [facility];  Service: General;  Laterality: N/A;   TONSILLECTOMY     TUBAL LIGATION  2 years ago     Medications:   Current Facility-Administered Medications:    acetaminophen  (TYLENOL ) tablet 650 mg, 650 mg, Oral, Q6H PRN **OR** acetaminophen  (TYLENOL ) suppository 650 mg, 650 mg, Rectal, Q6H PRN, Laurita Manor T, MD   albuterol  (PROVENTIL ) (2.5 MG/3ML) 0.083% nebulizer solution 2.5 mg, 2.5 mg, Nebulization, Q6H PRN, Laurita Manor T, MD   artificial tears ophthalmic solution 1 drop, 1 drop, Both Eyes, TID, Laurita Manor T, MD, 1 drop at 01/19/24 0952   azelastine (ASTELIN) 0.1 % nasal spray 2 spray, 2 spray, Each Nare, BID, Laurita Manor T, MD, 2 spray at 01/19/24 9047   benazepril  (LOTENSIN ) tablet 20 mg, 20 mg, Oral, Daily, Laurita Manor T, MD, 20 mg at 01/19/24 0944   busPIRone  (BUSPAR ) tablet 30 mg, 30 mg, Oral, BID, Zhang, Ping T, MD, 30 mg at 01/19/24 0944   ciprofloxacin (CIPRO) tablet 500 mg, 500 mg, Oral, BID, Regalado, Belkys  A, MD, 500 mg at 01/19/24 0945   clobetasol  cream (TEMOVATE ) 0.05 %, , Topical, BID, Laurita Manor T, MD, Given at 01/19/24 0953   doxycycline (VIBRA-TABS) tablet 100 mg, 100 mg, Oral, Q12H, Regalado, Belkys A, MD, 100 mg at 01/19/24 0944   famotidine  (PEPCID ) tablet 20 mg, 20 mg, Oral, BID, Zhang, Ping T, MD, 20 mg at 01/19/24 0945   feeding supplement (ENSURE PLUS HIGH PROTEIN) liquid 237 mL, 237 mL, Oral, BID BM, Laurita Manor T, MD, 237 mL at 01/18/24 1712   fluticasone  (FLONASE ) 50 MCG/ACT nasal spray 2 spray, 2 spray, Each Nare, Daily, Laurita Manor T, MD, 2 spray at 01/19/24 0951   gabapentin  (NEURONTIN ) capsule 100 mg, 100 mg, Oral, BID, Zhang, Ping T, MD, 100 mg at 01/19/24 0944   guaiFENesin (MUCINEX) 12 hr tablet 1,200 mg, 1,200 mg, Oral, BID, Regalado, Belkys A, MD, 1,200 mg at 01/19/24 0945   hydrOXYzine  (ATARAX ) tablet 12.5-25 mg, 12.5-25 mg, Oral, BID PRN, Zhang, Ping T, MD   insulin aspart (novoLOG) injection 0-9 Units, 0-9 Units, Subcutaneous, TID WC, Regalado, Belkys A, MD, 7 Units at 01/19/24 0943   lidocaine  (LIDODERM ) 5 % 1 patch, 1 patch, Transdermal, Q24H, Zhang, Ping T, MD   loratadine (CLARITIN) tablet 10 mg, 10 mg, Oral, Daily, Laurita, Ping T, MD, 10 mg at 01/19/24 0944   lurasidone  (LATUDA ) tablet 40 mg, 40 mg, Oral, Q supper, Laurita Manor T, MD, 40 mg at 01/18/24 2130   nystatin (MYCOSTATIN) 100000 UNIT/ML suspension 500,000 Units, 5 mL, Oral, QID, Regalado, Belkys A, MD, 500,000 Units at 01/19/24 0946   ondansetron  (ZOFRAN ) tablet 4 mg, 4 mg, Oral, Q6H PRN **OR** ondansetron  (ZOFRAN ) injection 4 mg, 4 mg, Intravenous, Q6H PRN, Laurita Manor T, MD   traZODone  (DESYREL ) tablet 150 mg, 150 mg, Oral, QHS, Laurita Manor T, MD, 150 mg at 01/18/24 2129  Allergies: No Known Allergies  PAULETTE BEETS, MD

## 2024-01-19 NOTE — Evaluation (Signed)
 Physical Therapy Evaluation Patient Details Name: Valerie Reilly MRN: [id number] DOB: Dec 24, 1975 Today's Date: 01/19/2024  History of Present Illness  Laurenashley Viar Castellanos is a 48 y.o. female sent from PCPs office for possible lithium  toxicity. Lithium  level was elevated at 1.7.  Patient was referred for admission.  Patient was also started on Macrobid for UTI.  PMH: bipolar disorder on lithium  therapy, memory loss, HTN, HLD, obesity, IIDM  Clinical Impression  Pt admitted with above diagnosis. PTA, pt reports ind with in home and community ambulation without AD, ind with self care and household chores, daughter and fiance both work and share household chores, pt's mom also available, pt still drives, denies falls in the last 6 months. Pt and daughter on phone report BLE tremors but unsure of how long they have been going on or when they occur, report neurology appointment in Jan 2026 for evaluation. On eval, pt with good sensation throughout BLE, good strength per MMT and symmetrical BLE. Transfers performed with supv for safety, therapist managing IV line. Pt amb with IV pole initially but unsuccessful with maneuvering, slightly unsteady and kicked IV pole, CGA for safety. Once therapist managed IV pole, pt amb 350 ft with supv, intermittent increased lateral weight shifting, noted flat foot posture throughout and little to no bil arm swing. Pt denies pain, shortness of breath, dizziness, lightheadedness and no tremors noted or reported during evaluation. Recommend HHPT at d/c with continued family support. Pt currently with functional limitations due to the deficits listed below (see PT Problem List). Pt will benefit from acute skilled PT to increase their independence and safety with mobility to allow discharge.           If plan is discharge home, recommend the following: Assist for transportation;Help with stairs or ramp for entrance;Assistance with cooking/housework   Can travel by private  vehicle        Equipment Recommendations Other (comment) (pt requests shower chair)  Recommendations for Other Services       Functional Status Assessment Patient has had a recent decline in their functional status and demonstrates the ability to make significant improvements in function in a reasonable and predictable amount of time.     Precautions / Restrictions Precautions Precautions: Fall Restrictions Weight Bearing Restrictions Per Provider Order: No      Mobility  Bed Mobility               General bed mobility comments: walking to bathroom upon therapist's arrival    Transfers Overall transfer level: Needs assistance Equipment used: None Transfers: Sit to/from Stand Sit to Stand: Supervision           General transfer comment: cues for IV line for safety with transfer    Ambulation/Gait Ambulation/Gait assistance: Contact guard assist, Supervision Gait Distance (Feet): 350 Feet Assistive device: IV Pole, None Gait Pattern/deviations: Step-through pattern, Decreased stride length Gait velocity: WFL     General Gait Details: flat foot posture, step through gait pattern, initially steering IV pole but unable to manage so therapist maneuvered IV pole, little to no bil arm swing, minimal foot clearance with intermittent increased lateral weight shifting in swing phase  Stairs            Wheelchair Mobility     Tilt Bed    Modified Rankin (Stroke Patients Only)       Balance Overall balance assessment: Mild deficits observed, not formally tested  Pertinent Vitals/Pain Pain Assessment Pain Assessment: No/denies pain    Home Living Family/patient expects to be discharged to:: Private residence Living Arrangements: Spouse/significant other;Children Available Help at Discharge: Family Type of Home: Mobile home Home Access: Stairs to enter Entrance Stairs-Rails:  Left Entrance Stairs-Number of Steps: 3   Home Layout: One level Home Equipment: Grab bars - tub/shower      Prior Function Prior Level of Function : Independent/Modified Independent;Driving             Mobility Comments: pt reports ind without AD, denies falls ADLs Comments: pt reports ind with self care, shares household chores with family     Extremity/Trunk Assessment        Lower Extremity Assessment Lower Extremity Assessment: RLE deficits/detail;LLE deficits/detail RLE Deficits / Details: AROM WFL, strength 5/5 knee extension, hip abduction, and hip adduction, strength 4/5 hip flexion, denies numbness/tingling RLE Sensation: WNL RLE Coordination: WNL LLE Deficits / Details: AROM WFL, strength 5/5 knee extension, hip abduction, and hip adduction, strength 4/5 hip flexion, denies numbness/tingling LLE Sensation: WNL LLE Coordination: WNL    Cervical / Trunk Assessment Cervical / Trunk Assessment: Normal  Communication   Communication Communication: No apparent difficulties    Cognition Arousal: Alert Behavior During Therapy: Flat affect, WFL for tasks assessed/performed   PT - Cognitive impairments: No apparent impairments                       PT - Cognition Comments: pt somewhat flat, but also voicing frustrations with daughter (on phone) and mother (in room), responds positively to relaxation cues Following commands: Intact       Cueing       General Comments      Exercises     Assessment/Plan    PT Assessment Patient needs continued PT services  PT Problem List Decreased activity tolerance;Decreased balance       PT Treatment Interventions DME instruction;Gait training;Stair training;Functional mobility training;Therapeutic exercise;Therapeutic activities;Balance training;Patient/family education;Neuromuscular re-education    PT Goals (Current goals can be found in the Care Plan section)  Acute Rehab PT Goals Patient Stated Goal:  retuen home PT Goal Formulation: With patient/family Time For Goal Achievement: 02/02/24 Potential to Achieve Goals: Good    Frequency Min 2X/week     Co-evaluation               AM-PAC PT 6 Clicks Mobility  Outcome Measure Help needed turning from your back to your side while in a flat bed without using bedrails?: A Little Help needed moving from lying on your back to sitting on the side of a flat bed without using bedrails?: A Little Help needed moving to and from a bed to a chair (including a wheelchair)?: A Little Help needed standing up from a chair using your arms (e.g., wheelchair or bedside chair)?: A Little Help needed to walk in hospital room?: A Little Help needed climbing 3-5 steps with a railing? : A Little 6 Click Score: 18    End of Session Equipment Utilized During Treatment: Gait belt Activity Tolerance: Patient tolerated treatment well Patient left: in bed;with call bell/phone within reach;with family/visitor present Nurse Communication: Mobility status PT Visit Diagnosis: Other abnormalities of gait and mobility (R26.89)    Time: 8695-8670 PT Time Calculation (min) (ACUTE ONLY): 25 min   Charges:   PT Evaluation $PT Eval Low Complexity: 1 Low PT Treatments $Gait Training: 8-22 mins PT General Charges $$ ACUTE PT VISIT: 1 Visit  Metta Ave PT, DPT 01/19/24, 2:53 PM

## 2024-01-20 ENCOUNTER — Telehealth: Payer: Self-pay

## 2024-01-20 ENCOUNTER — Other Ambulatory Visit: Payer: Self-pay

## 2024-01-20 ENCOUNTER — Other Ambulatory Visit (HOSPITAL_COMMUNITY): Payer: Self-pay

## 2024-01-20 DIAGNOSIS — T56891A Toxic effect of other metals, accidental (unintentional), initial encounter: Secondary | ICD-10-CM | POA: Diagnosis not present

## 2024-01-20 DIAGNOSIS — F603 Borderline personality disorder: Secondary | ICD-10-CM

## 2024-01-20 LAB — CBC
HCT: 38.3 % (ref 36.0–46.0)
Hemoglobin: 12.7 g/dL (ref 12.0–15.0)
MCH: 32.8 pg (ref 26.0–34.0)
MCHC: 33.2 g/dL (ref 30.0–36.0)
MCV: 99 fL (ref 80.0–100.0)
Platelets: 354 K/uL (ref 150–400)
RBC: 3.87 MIL/uL (ref 3.87–5.11)
RDW: 14.1 % (ref 11.5–15.5)
WBC: 12.5 K/uL — ABNORMAL HIGH (ref 4.0–10.5)
nRBC: 0 % (ref 0.0–0.2)

## 2024-01-20 LAB — GLUCOSE, CAPILLARY: Glucose-Capillary: 197 mg/dL — ABNORMAL HIGH (ref 70–99)

## 2024-01-20 LAB — T4, FREE: Free T4: 0.85 ng/dL (ref 0.61–1.12)

## 2024-01-20 LAB — LITHIUM LEVEL: Lithium Lvl: 0.57 mmol/L — ABNORMAL LOW (ref 0.60–1.20)

## 2024-01-20 LAB — BASIC METABOLIC PANEL WITH GFR
Anion gap: 10 (ref 5–15)
BUN: 9 mg/dL (ref 6–20)
CO2: 19 mmol/L — ABNORMAL LOW (ref 22–32)
Calcium: 10.2 mg/dL (ref 8.9–10.3)
Chloride: 111 mmol/L (ref 98–111)
Creatinine, Ser: 0.84 mg/dL (ref 0.44–1.00)
GFR, Estimated: >60 mL/min (ref >60)
Glucose, Bld: 190 mg/dL — ABNORMAL HIGH (ref 70–99)
Potassium: 3.9 mmol/L (ref 3.5–5.1)
Sodium: 140 mmol/L (ref 135–145)

## 2024-01-20 MED ORDER — NYSTATIN 100000 UNIT/ML MT SUSP
5.0000 mL | Freq: Four times a day (QID) | OROMUCOSAL | 0 refills | Status: AC
  Filled 2024-01-20: qty 60, 3d supply, fill #0

## 2024-01-20 MED ORDER — AMOXICILLIN-POT CLAVULANATE 875-125 MG PO TABS
1.0000 | ORAL_TABLET | Freq: Two times a day (BID) | ORAL | 0 refills | Status: AC
  Filled 2024-01-20: qty 10, 5d supply, fill #0

## 2024-01-20 MED ORDER — GUAIFENESIN ER 600 MG PO TB12
1200.0000 mg | ORAL_TABLET | Freq: Two times a day (BID) | ORAL | 0 refills | Status: AC
  Filled 2024-01-20: qty 120, 30d supply, fill #0

## 2024-01-20 MED ORDER — CIPROFLOXACIN HCL 500 MG PO TABS
500.0000 mg | ORAL_TABLET | Freq: Two times a day (BID) | ORAL | 0 refills | Status: AC
  Filled 2024-01-20: qty 4, 2d supply, fill #0

## 2024-01-20 MED ORDER — LITHIUM CARBONATE 300 MG PO CAPS
300.0000 mg | ORAL_CAPSULE | Freq: Two times a day (BID) | ORAL | Status: DC
  Administered 2024-01-20: 300 mg via ORAL
  Filled 2024-01-20: qty 1

## 2024-01-20 MED ORDER — SODIUM CHLORIDE 0.9 % IV BOLUS
500.0000 mL | Freq: Once | INTRAVENOUS | Status: AC
  Administered 2024-01-20: 500 mL via INTRAVENOUS

## 2024-01-20 MED ORDER — LITHIUM CARBONATE 300 MG PO CAPS
300.0000 mg | ORAL_CAPSULE | Freq: Two times a day (BID) | ORAL | 0 refills | Status: DC
  Filled 2024-01-20: qty 60, 30d supply, fill #0

## 2024-01-20 NOTE — Evaluation (Signed)
 Occupational Therapy Evaluation Patient Details Name: Valerie Reilly MRN: [id number] DOB: 07-21-1975 Today's Date: 01/20/2024   History of Present Illness   Valerie Reilly is a 48 yr old female sent from PCPs office for possible lithium  toxicity.  Patient was also found to have an open wound below her L breast.  PMH: bipolar disorder on lithium  therapy, memory loss, HTN, HLD, obesity, IIDM     Clinical Impressions The patient inquired about options for using a shower seat at home, stating she occasionally feels unsteady and wants to decrease her risk for falls. OT subsequently educated her on options for use of a shower chair vs. shower bench, as well as options for use and placement in her home. OT also provided recommendations for grab bar placement and the option for obtaining and using a tub rail. The patient then performed all assessed tasks independently. She is at her baseline level of functioning for self-care management. She does not require further OT services. OT will sign off and recommend she return home at discharge.      If plan is discharge home, recommend the following:   Help with stairs or ramp for entrance;Assist for transportation;Direct supervision/assist for medications management     Functional Status Assessment   Patient has not had a recent decline in their functional status     Equipment Recommendations   Tub/shower seat     Recommendations for Other Services         Precautions/Restrictions   Restrictions Weight Bearing Restrictions Per Provider Order: No     Mobility Bed Mobility               General bed mobility comments: Patient was found standing at the door to her room at the start of the session        Balance Overall balance assessment: Mild deficits observed, not formally tested       ADL either performed or assessed with clinical judgement   ADL Overall ADL's : Independent;At baseline             Pertinent Vitals/Pain Pain Assessment Pain Assessment: No/denies pain     Extremity/Trunk Assessment Upper Extremity Assessment Upper Extremity Assessment: Overall WFL for tasks assessed   Lower Extremity Assessment Lower Extremity Assessment: Overall WFL for tasks assessed       Communication Communication Communication: No apparent difficulties   Cognition Arousal: Alert Behavior During Therapy: WFL for tasks assessed/performed        Following commands: Intact       Cueing  General Comments   Cueing Techniques: Verbal cues              Home Living Family/patient expects to be discharged to:: Private residence Living Arrangements: Spouse/significant other;Children (fiance' and daughter) Available Help at Discharge: Family Type of Home: Mobile home Home Access: Stairs to enter Entrance Stairs-Number of Steps: 3 Entrance Stairs-Rails: Left Home Layout: One level     Bathroom Shower/Tub: Tub/shower unit         Home Equipment: Grab bars - tub/shower          Prior Functioning/Environment Prior Level of Function : Independent/Modified Independent;Driving             Mobility Comments:  (Independent with ambulation.) ADLs Comments: pt reports ind with self care, shares household chores with family            OT Goals(Current goals can be found in the care plan section)   Acute Rehab OT  Goals OT Goal Formulation: All assessment and education complete, DC therapy   OT Frequency:   N/A       AM-PAC OT 6 Clicks Daily Activity     Outcome Measure Help from another person eating meals?: None Help from another person taking care of personal grooming?: None Help from another person toileting, which includes using toliet, bedpan, or urinal?: None Help from another person bathing (including washing, rinsing, drying)?: None Help from another person to put on and taking off regular upper body clothing?: None Help from another person  to put on and taking off regular lower body clothing?: None 6 Click Score: 24   End of Session Equipment Utilized During Treatment: Other (comment) (N/A) Nurse Communication: Other (comment) (IV beeping due to infusion complete)  Activity Tolerance: Patient tolerated treatment well Patient left: with call bell/phone within reach  OT Visit Diagnosis: Muscle weakness (generalized) (M62.81)                Time: 1100-1119 OT Time Calculation (min): 19 min Charges:  OT General Charges $OT Visit: 1 Visit OT Evaluation $OT Eval Low Complexity: 1 Low   Delanna JINNY Lesches, OTR/L 01/20/2024, 12:47 PM

## 2024-01-20 NOTE — Plan of Care (Signed)

## 2024-01-20 NOTE — Telephone Encounter (Signed)
 Copied from CRM #[id number]. Topic: Clinical - Request for Lab/Test Order >> Jan 20, 2024 12:01 PM Pinkey ORN wrote: Reason for CRM: Order Request >> Jan 20, 2024 12:03 PM Pinkey ORN wrote: Patient is requesting an order for a mammogram. States she's needing an order to have it completed on the mobile bus / clinic.

## 2024-01-20 NOTE — Plan of Care (Signed)
 Problem: Education: Goal: Knowledge of General Education information will improve Description: Including pain rating scale, medication(s)/side effects and non-pharmacologic comfort measures 01/20/2024 1144 by Reesa Burnard HERO, RN Outcome: Adequate for Discharge 01/20/2024 9187 by Reesa Burnard HERO, RN Outcome: Progressing   Problem: Health Behavior/Discharge Planning: Goal: Ability to manage health-related needs will improve 01/20/2024 1144 by Reesa Burnard HERO, RN Outcome: Adequate for Discharge 01/20/2024 9187 by Reesa Burnard HERO, RN Outcome: Progressing   Problem: Clinical Measurements: Goal: Ability to maintain clinical measurements within normal limits will improve 01/20/2024 1144 by Reesa Burnard HERO, RN Outcome: Adequate for Discharge 01/20/2024 9187 by Reesa Burnard HERO, RN Outcome: Progressing Goal: Will remain free from infection 01/20/2024 1144 by Reesa Burnard HERO, RN Outcome: Adequate for Discharge 01/20/2024 9187 by Reesa Burnard HERO, RN Outcome: Progressing Goal: Diagnostic test results will improve 01/20/2024 1144 by Reesa Burnard HERO, RN Outcome: Adequate for Discharge 01/20/2024 9187 by Reesa Burnard HERO, RN Outcome: Progressing Goal: Respiratory complications will improve 01/20/2024 1144 by Reesa Burnard HERO, RN Outcome: Adequate for Discharge 01/20/2024 9187 by Reesa Burnard HERO, RN Outcome: Progressing Goal: Cardiovascular complication will be avoided 01/20/2024 1144 by Reesa Burnard HERO, RN Outcome: Adequate for Discharge 01/20/2024 9187 by Reesa Burnard HERO, RN Outcome: Progressing   Problem: Activity: Goal: Risk for activity intolerance will decrease 01/20/2024 1144 by Reesa Burnard HERO, RN Outcome: Adequate for Discharge 01/20/2024 [phone] by Reesa Burnard HERO, RN Outcome: Progressing   Problem: Nutrition: Goal: Adequate nutrition will be maintained 01/20/2024  1144 by Reesa Burnard HERO, RN Outcome: Adequate for Discharge 01/20/2024 9187 by Reesa Burnard HERO, RN Outcome: Progressing   Problem: Coping: Goal: Level of anxiety will decrease 01/20/2024 1144 by Reesa Burnard HERO, RN Outcome: Adequate for Discharge 01/20/2024 9187 by Reesa Burnard HERO, RN Outcome: Progressing   Problem: Elimination: Goal: Will not experience complications related to bowel motility 01/20/2024 1144 by Reesa Burnard HERO, RN Outcome: Adequate for Discharge 01/20/2024 9187 by Reesa Burnard HERO, RN Outcome: Progressing Goal: Will not experience complications related to urinary retention 01/20/2024 1144 by Reesa Burnard HERO, RN Outcome: Adequate for Discharge 01/20/2024 9187 by Reesa Burnard HERO, RN Outcome: Progressing   Problem: Pain Managment: Goal: General experience of comfort will improve and/or be controlled 01/20/2024 1144 by Reesa Burnard HERO, RN Outcome: Adequate for Discharge 01/20/2024 9187 by Reesa Burnard HERO, RN Outcome: Progressing   Problem: Safety: Goal: Ability to remain free from injury will improve 01/20/2024 1144 by Reesa Burnard HERO, RN Outcome: Adequate for Discharge 01/20/2024 9187 by Reesa Burnard HERO, RN Outcome: Progressing   Problem: Skin Integrity: Goal: Risk for impaired skin integrity will decrease 01/20/2024 1144 by Reesa Burnard HERO, RN Outcome: Adequate for Discharge 01/20/2024 9187 by Reesa Burnard HERO, RN Outcome: Progressing   Problem: Education: Goal: Ability to describe self-care measures that may prevent or decrease complications (Diabetes Survival Skills Education) will improve 01/20/2024 1144 by Reesa Burnard HERO, RN Outcome: Adequate for Discharge 01/20/2024 [phone] by Reesa Burnard HERO, RN Outcome: Progressing Goal: Individualized Educational Video(s) 01/20/2024 1144 by Reesa Burnard HERO, RN Outcome: Adequate for  Discharge 01/20/2024 [phone] by Reesa Burnard HERO, RN Outcome: Progressing   Problem: Coping: Goal: Ability to adjust to condition or change in health will improve 01/20/2024 1144 by Reesa Burnard HERO, RN Outcome: Adequate for Discharge 01/20/2024 9187 by Reesa Burnard HERO, RN Outcome: Progressing   Problem: Fluid Volume: Goal: Ability to maintain a balanced intake and output will improve 01/20/2024 1144 by Reesa Burnard HERO, RN Outcome: Adequate for Discharge 01/20/2024  9187 by Reesa Burnard HERO, RN Outcome: Progressing   Problem: Health Behavior/Discharge Planning: Goal: Ability to identify and utilize available resources and services will improve 01/20/2024 1144 by Hogan-Nutting, Burnard HERO, RN Outcome: Adequate for Discharge 01/20/2024 9187 by Reesa Burnard HERO, RN Outcome: Progressing Goal: Ability to manage health-related needs will improve 01/20/2024 1144 by Hogan-Nutting, Burnard HERO, RN Outcome: Adequate for Discharge 01/20/2024 9187 by Reesa Burnard HERO, RN Outcome: Progressing   Problem: Metabolic: Goal: Ability to maintain appropriate glucose levels will improve 01/20/2024 1144 by Reesa Burnard HERO, RN Outcome: Adequate for Discharge 01/20/2024 9187 by Reesa Burnard HERO, RN Outcome: Progressing   Problem: Nutritional: Goal: Maintenance of adequate nutrition will improve 01/20/2024 1144 by Reesa Burnard HERO, RN Outcome: Adequate for Discharge 01/20/2024 9187 by Reesa Burnard HERO, RN Outcome: Progressing Goal: Progress toward achieving an optimal weight will improve 01/20/2024 1144 by Reesa Burnard HERO, RN Outcome: Adequate for Discharge 01/20/2024 9187 by Reesa Burnard HERO, RN Outcome: Progressing   Problem: Skin Integrity: Goal: Risk for impaired skin integrity will decrease 01/20/2024 1144 by Reesa Burnard HERO, RN Outcome: Adequate for Discharge 01/20/2024 9187 by Reesa Burnard HERO,  RN Outcome: Progressing   Problem: Tissue Perfusion: Goal: Adequacy of tissue perfusion will improve 01/20/2024 1144 by Reesa Burnard HERO, RN Outcome: Adequate for Discharge 01/20/2024 9187 by Reesa Burnard HERO, RN Outcome: Progressing

## 2024-01-20 NOTE — TOC Progression Note (Addendum)
 Transition of Care [facility]) - Progression Note    Patient Details  Name: Valerie Reilly MRN: [id number] Date of Birth: [birth year]  [facility] CM/SW Contact  Doneta Glenys DASEN, RN Phone Number: 01/20/2024, 8:55 AM  Clinical Narrative:    CM discussed PT recommendation for [facility] PT and DME-shower chair  and she agreeable to sending out referrals.   Expected Discharge Plan: Home/Self Care Barriers to Discharge: Continued Medical Work up               Expected Discharge Plan and Services In-house Referral: Chaplain Discharge Planning Services: CM Consult   Living arrangements for the past 2 months: Mobile Home                 DME Arranged: N/A DME Agency: NA       HH Arranged: NA HH Agency: NA         Social Drivers of Health (SDOH) Interventions SDOH Screenings   Food Insecurity: No Food Insecurity (01/17/2024)  Recent Concern: Food Insecurity - Food Insecurity Present (12/16/2023)  Housing: Low Risk  (01/17/2024)  Transportation Needs: No Transportation Needs (01/17/2024)  Utilities: Not At Risk (01/17/2024)  Recent Concern: Utilities - At Risk (12/01/2023)  Alcohol Screen: Low Risk  (06/06/2022)  Depression (PHQ2-9): Low Risk  (01/10/2024)  Financial Resource Strain: High Risk (12/01/2023)  Physical Activity: Inactive (12/15/2022)  Social Connections: Socially Isolated (12/15/2022)  Stress: No Stress Concern Present (12/15/2022)  Tobacco Use: High Risk (01/19/2024)  Health Literacy: Adequate Health Literacy (12/15/2022)    Readmission Risk Interventions     No data to display

## 2024-01-20 NOTE — Discharge Summary (Signed)
 Physician Discharge Summary   Patient: Valerie Reilly MRN: [id number] DOB: March 30, 1975  Admit date:     01/17/2024  Discharge date: 01/20/24  Discharge Physician: Owen DELENA Lore   PCP: Theotis Haze ORN, NP   Recommendations at discharge:   Please follow result of Free T 3 and Free T 4 Follow up with Psychiatrist for further management of lithium  doses.  Follow up resolution of small left under breast wound.    Discharge Diagnoses: Principal Problem:   Lithium  toxicity, accidental or unintentional, initial encounter Active Problems:   Lithium  toxicity  Resolved Problems:   * No resolved hospital problems. *  Hospital Course: 48 year old with past medical history significant for bipolar disorder on lithium  therapy memory loss, hypertension, hyperlipidemia, obesity diabetes type 2 sent from PCP office for possible lithium  toxicity.  Patient lithium  dose was recently decreased to 900 mg daily.  Patient has had some memory problems and accidentally she might have taken more lithium  pills than recommended.  Since last week patient has been feeling weak with malaise, lightheadedness.  Lithium  level was elevated at 1.7.  Patient was referred for admission.  Patient was also started on Macrobid for UTI.    Assessment and Plan: 1-Lithium  toxicity, accidental or unintentional overdose: - Patient presents with dizziness, lightheadedness weakness. - Lithium  level elevated: 1.8--- down today to 1.4 - Psychiatric consulted and recommendation is to resume lithium  at a lower dose 300 mg twice daily, when lithium  level below 0.6 -Lithium  level at 0.57 , discharge on  Lithium  BID> 300mg     2-recent diagnosis of UTI: UA with only 0-5 white blood cell.  Will discontinue Macrobid. Urine culture: Enterococcus Faecalis.  On Cipro for 3 days.    3-Small open wound below left breast - She has had this when for some time. -Wound care consulted. Culture growing streptococcus agalactiae.   -Continue with  Augmentin . Local wound care.    4-memory problem:  TSH mildly elevated at 5, check free T 3 and Free T 4  and B12 normal 539.  Hypothyroidism:  On synthroid .  TSH elevated.   Bipolar: Continue with trazodone , resume lower dose  lithium ., 300 mg BID   Hypertension: Continue ACE   Diabetes type 2 with hyperglycemia: resume metformin  and glipizide .    Diabetes  neuropathy: Continue gabapentin    Psoriasis on methotrexate  Folic acid 20 Hypercalcemia: In the setting of dehydration resolved with fluids Oral thrush: started   nystatin Recurrent stye: Follow-up with ophthalmology   Cough: Chest x-ray negative for pneumonia or bronchitis.  On  Mucinex Counseling regarding smoking cessation.    Estimated body mass index is 32.91 kg/m as calculated from the following:   Height as of this encounter: 5' 6.5 (1.689 m).   Weight as of this encounter: 93.9 kg.         Consultants: Psych  Procedures performed: none Disposition: Home Diet recommendation:  Discharge Diet Orders (From admission, onward)     Start     Ordered   01/20/24 0000  Diet - low sodium heart healthy        01/20/24 1029           Carb modified diet DISCHARGE MEDICATION: Allergies as of 01/20/2024   No Known Allergies      Medication List     STOP taking these medications    nitrofurantoin (macrocrystal-monohydrate) 100 MG capsule Commonly known as: Macrobid       TAKE these medications    albuterol  108 (90 Base) MCG/ACT  inhaler Commonly known as: VENTOLIN  HFA Inhale 2 puffs into the lungs every 6 (six) hours as needed for wheezing or shortness of breath.   amoxicillin -clavulanate 875-125 MG tablet Commonly known as: AUGMENTIN  Take 1 tablet by mouth every 12 (twelve) hours for 5 days.   Azelastine HCl 137 MCG/SPRAY Soln Place 2 sprays into both nostrils 2 (two) times daily. Use in each nostril as directed What changed: additional instructions   benazepril  20 MG  tablet Commonly known as: LOTENSIN  Take 1 tablet (20 mg total) by mouth daily.   busPIRone  30 MG tablet Commonly known as: BUSPAR  Take 1 tablet (30 mg total) by mouth 2 (two) times daily. What changed: when to take this   cetirizine 10 MG tablet Commonly known as: ZYRTEC Take 10 mg by mouth in the morning.   cholecalciferol 1000 units tablet Commonly known as: VITAMIN D  Take 1,000 Units by mouth daily.   ciprofloxacin 500 MG tablet Commonly known as: CIPRO Take 1 tablet (500 mg total) by mouth 2 (two) times daily for 2 days.   clobetasol  0.05 % external solution Commonly known as: TEMOVATE  Apply 1 (one) mL two times daily to SCALP. after 14 days reduce use to 2 days per week. What changed:  how much to take how to take this when to take this additional instructions   clobetasol  cream 0.05 % Commonly known as: TEMOVATE  Apply 1 (one) Application two times a day, 2 days per week What changed: Another medication with the same name was changed. Make sure you understand how and when to take each.   estradiol 0.1 MG/GM vaginal cream Commonly known as: ESTRACE VAGINAL Place 1 gram vaginally at bedtime. Every night for 2 weeks and then twice a week aftewards What changed: when to take this   famotidine  20 MG tablet Commonly known as: PEPCID  Take 1 tablet (20 mg total) by mouth 2 (two) times daily. What changed: when to take this   fluocinonide  0.05 % external solution Commonly known as: LIDEX  Apply 1 Application topically 2 (two) times daily as needed   fluticasone  50 MCG/ACT nasal spray Commonly known as: FLONASE  Place 2 sprays into both nostrils daily.   gabapentin  100 MG capsule Commonly known as: Neurontin  Take 1 capsule (100 mg total) by mouth 2 (two) times daily. What changed: when to take this   glipiZIDE  10 MG 24 hr tablet Commonly known as: GLUCOTROL  XL Take 1 tablet (10 mg total) by mouth daily with breakfast.   guaiFENesin 600 MG 12 hr tablet Commonly  known as: MUCINEX Take 2 tablets (1,200 mg total) by mouth 2 (two) times daily.   hydroxypropyl methylcellulose / hypromellose 2.5 % ophthalmic solution Commonly known as: ISOPTO TEARS / GONIOVISC Place 1 drop into both eyes in the morning and at bedtime.   hydrOXYzine  25 MG tablet Commonly known as: ATARAX  Take 0.5-1 tablets (12.5-25 mg total) by mouth 2 (two) times daily as needed for anxiety.   levothyroxine  50 MCG tablet Commonly known as: SYNTHROID  Take 50 mcg by mouth daily before breakfast.   lidocaine  5 % Commonly known as: Lidoderm  Place 1 patch onto the skin daily. Remove & Discard patch within 12 hours daily, to lower back   lithium  carbonate 300 MG capsule Take 1 capsule (300 mg total) by mouth 2 (two) times daily with a meal. What changed:  how much to take when to take this   lurasidone  40 MG Tabs tablet Commonly known as: Latuda  Take 1 tablet (40 mg total) by  mouth daily with supper. Should be taken with food (at least 350 calories). What changed:  when to take this additional instructions   metFORMIN  1000 MG tablet Commonly known as: GLUCOPHAGE  Take 1,000 mg by mouth 2 (two) times daily with a meal.   methotrexate  2.5 MG tablet Commonly known as: RHEUMATREX Take 1 tablet by mouth every 12 hours for 3 doses per week. (only 3 tablets per week)   multivitamin capsule Take 1 capsule by mouth daily with breakfast.   NICODERM CQ  TD Place 1 patch onto the skin daily as needed (for smoking cessation while hospitalized).   nystatin 100000 UNIT/ML suspension Commonly known as: MYCOSTATIN Take 5 mLs (500,000 Units total) by mouth 4 (four) times daily.   simvastatin  40 MG tablet Commonly known as: ZOCOR  Take 1 tablet (40 mg total) by mouth daily.   traZODone  150 MG tablet Commonly known as: DESYREL  Take 1 tablet (150 mg total) by mouth at bedtime.               Durable Medical Equipment  (From admission, onward)           Start     Ordered    01/19/24 1226  For home use only DME Shower stool  Once        01/19/24 1225              Discharge Care Instructions  (From admission, onward)           Start     Ordered   01/20/24 0000  Discharge wound care:       Comments: Left Breast wound; cleanse with Vashe #[id number] and leave soaking over wound for 10 minutes, pat dry but do not rinse, then place folded Xeroform yellow gauze #[id number], cover with small Mepilex foam or 4x4 gauze taped in place.   01/20/24 1029            Follow-up Information     Theotis Haze ORN, NP Follow up.   Specialty: Nurse Practitioner Contact information: [address] [place] [address] [phone]                Discharge Exam: Fredricka Weights   01/17/24 1308  Weight: 93.9 kg   General; NAD  Condition at discharge: stable  The results of significant diagnostics from this hospitalization (including imaging, microbiology, ancillary and laboratory) are listed below for reference.   Imaging Studies: DG CHEST PORT 1 VIEW Result Date: 01/18/2024 EXAM: 1 VIEW(S) XRAY OF THE CHEST 01/18/2024 11:07:00 AM COMPARISON: None available. CLINICAL HISTORY: Cough FINDINGS: LUNGS AND PLEURA: No focal pulmonary opacity. No pulmonary edema. No pleural effusion. No pneumothorax. HEART AND MEDIASTINUM: No acute abnormality of the cardiac and mediastinal silhouettes. BONES AND SOFT TISSUES: No acute osseous abnormality. IMPRESSION: 1. No acute cardiopulmonary abnormality. Electronically signed by: Rogelia Myers MD 01/18/2024 11:38 AM EST RP Workstation: [id number]    Microbiology: Results for orders placed or performed during the hospital encounter of 01/17/24  Aerobic Culture w Gram Stain (superficial specimen)     Status: None (Preliminary result)   Collection Time: 01/18/24  2:00 PM   Specimen: Breast; Wound  Result Value Ref Range Status   Specimen Description   Final    BREAST Performed at [facility], [address] [place], [place] [address]    Special Requests   Final    NONE Performed at [facility], [address], [place], [place] [address]  Gram Stain NO WBC SEEN FEW GRAM POSITIVE COCCI   Final   Culture   Final    ABUNDANT GROUP B STREP(S.AGALACTIAE)ISOLATED TESTING AGAINST S. AGALACTIAE NOT ROUTINELY PERFORMED DUE TO PREDICTABILITY OF AMP/PEN/VAN SUSCEPTIBILITY. Performed at [facility], [address] [place], [place] [address]    Report Status PENDING  Incomplete    Labs: CBC: Recent Labs  Lab 01/16/24 1655 01/17/24 1409 01/20/24 0432  WBC 14.3* 13.8* 12.5*  NEUTROABS 9.5* 10.1*  --   HGB 14.3 13.1 12.7  HCT 42.8 39.9 38.3  MCV 96 98.0 99.0  PLT 399 354 354   Basic Metabolic Panel: Recent Labs  Lab 01/16/24 1655 01/17/24 1409 01/18/24 0430 01/20/24 0432  NA 138 137 139 140  K 4.5 4.1 3.9 3.9  CL 106 105 114* 111  CO2 19* 20* 20* 19*  GLUCOSE 154* 230* 107* 190*  BUN 20 21* 17 9  CREATININE 1.06* 1.09* 0.87 0.84  CALCIUM 11.3* 10.9* 10.3 10.2  MG  --  2.0  --   --   PHOS  --  3.5  --   --    Liver Function Tests: Recent Labs  Lab 01/16/24 1655 01/17/24 1409  AST 18 21  ALT 31 28  ALKPHOS 138* 132*  BILITOT 0.5 0.4  PROT 7.5 7.1  ALBUMIN 4.8 4.5   CBG: Recent Labs  Lab 01/19/24 0833 01/19/24 1215 01/19/24 1629 01/19/24 2101 01/20/24 0737  GLUCAP 316* 167* 129* 150* 197*    Discharge time spent: greater than 30 minutes.  Signed: Owen DELENA Lore, MD [facility] 01/20/2024

## 2024-01-20 NOTE — Consult Note (Signed)
 [facility] Consult Follow-up  Patient Name: .Valerie Reilly  MRN: [id number]  DOB: 11-04-1975  Consult Order details:  Orders (From admission, onward)     Start     Ordered   01/18/24 1038  IP CONSULT TO PSYCHIATRY       Ordering Provider: Madelyne Owen LABOR, MD  Provider:  (Not yet assigned)  Question Answer Comment  Location [facility]   Reason for Consult? lithium  toxicity--Medications adjustment      01/18/24 1038             Mode of Visit: In person    Psychiatry Consult Evaluation  Service Date: January 20, 2024 LOS:  LOS: 1 day  Chief Complaint the patient is a 48 year old female with long history of bipolar disorder who has been on lithium  therapy and has a history of mild cognitive impairment, hypertension obesity and type II DM who was admitted for lithium  toxicity with a level of 1.7.  Consult was to evaluate her.  Primary Psychiatric Diagnoses  Bipolar disorder, currently euthymic. 2.  Borderline personality disorder 3.  Mild cognitive impairment 4.  Lithium  toxicity with a level of 1.88  Assessment  Valerie Reilly is a 48 y.o. female admitted: Medicallyfor 01/17/2024  1:05 PM for lithium  toxicity. She carries the psychiatric diagnoses of bipolar disorder and has a past medical history of hypertension mild cognitive impairment.   She was admitted with a lithium  level of 1.88 and some confusion over the frequency of her dosage.  Apparently before July 2025, her dose was 600 mg in the morning and 900 mg in the evening.  However this dose was decreased to 900 mg in the evening only.  It is unclear whether the patient accidentally took extra lithium  but the daughter started noticing that patient was getting progressively more confused and had a significant tremor.  She was seen by her primary care physician with progressively worsening confusion over the last 3 days.  She got worse to the point she was unable to take a shower  Stumbling and walking slowly.  After being seen by Dr. Newlin, she was referred to [facility] for lithium  toxicity.  The lithium  has been discontinued and when seen this morning, the patient was in the room with her daughter.  Daughter reports a significant improvement in her tremor and confusion. When the patient was seen she was alert, oriented and cooperative.  Her speech is of low volume with some latency but no obvious looseness of associations flight of ideas or tangentiality.  She maintained good eye contact and reports that she remains confused as to what really happened she thinks that she did not take extra medication but she is not sure.  She also endorses being on lithium  for over 30 years and not having any symptoms of toxicity before.  Historically patient has done very well on the lithium .  This appears to be an accidental overdose possibly due to mild cognitive impairment and forgetfulness.  Discussed with Dr. Mercy on chat and agree that once patient is stabilized, she can be restarted on very little low-dose of lithium .  Please see full recommendations below  01/19/2024: The patient was seen and reevaluated today.  Her mother was present in the room.  Patient reports episodic tremor which is improving and confusion also improving.  She still has some hesitancy about her dosage and agrees for her daughter to monitor her medications but she would still like to have control of them  and manage them on her own. Lithium  level is 0.9.  TSH is elevated to 5.920. Diagnoses:  Active Hospital problems: Principal Problem:   Lithium  toxicity, accidental or unintentional, initial encounter Active Problems:   Lithium  toxicity    Plan   ## Psychiatric Medication Recommendations:  Today the lithium  level is 0.9.  Will continue to hold the lithium  until the level is at least 0.6 or below and then restart at a lower dose.  Anticipate this to happen tomorrow. Consider treatment for  hypothyroidism if indicated.  ## Medical Decision Making Capacity: Not specifically addressed in this encounter  ## Further Work-up:  -- Per hospitalist EKG -- most recent EKG is from 2023.  Recommend an EKG for QTc -- Pertinent labwork reviewed earlier this admission includes: Daily lithium  levels   ## Disposition:-- There are no psychiatric contraindications to discharge at this time  ## Behavioral / Environmental: -Delirium Precautions: Delirium Interventions for Nursing and Staff: - RN to open blinds every AM. - To Bedside: Glasses, hearing aide, and pt's own shoes. Make available to patients. when possible and encourage use. - Encourage po fluids when appropriate, keep fluids within reach. - OOB to chair with meals. - Passive ROM exercises to all extremities with AM & PM care. - RN to assess orientation to person, time and place QAM and PRN. - Recommend extended visitation hours with familiar family/friends as feasible. - Staff to minimize disturbances at night. Turn off television when pt asleep or when not in use.    ## Safety and Observation Level:  - Based on my clinical evaluation, I estimate the patient to be at low risk of self harm in the current setting. - At this time, we recommend  routine. This decision is based on my review of the chart including patient's history and current presentation, interview of the patient, mental status examination, and consideration of suicide risk including evaluating suicidal ideation, plan, intent, suicidal or self-harm behaviors, risk factors, and protective factors. This judgment is based on our ability to directly address suicide risk, implement suicide prevention strategies, and develop a safety plan while the patient is in the clinical setting. Please contact our team if there is a concern that risk level has changed.  CSSR Risk Category:C-SSRS RISK CATEGORY: No Risk  Suicide Risk Assessment: Patient has following modifiable risk factors for  suicide: under treated depression  and triggering events, which we are addressing by outpatient follow-up appointments. Patient has following non-modifiable or demographic risk factors for suicide: psychiatric hospitalization Patient has the following protective factors against suicide: Access to outpatient mental health care, Supportive family, Supportive friends, and Frustration tolerance  Thank you for this consult request. Recommendations have been communicated to the primary team.  We will sign off at this time.   Sharlot Becker, NP       History of Present Illness  Relevant Aspects of [facility] Course:  Admitted on 01/17/2024 for lithium  toxicity. They are monitoring her closely.   Patient Report:  The patient was seen and evaluated on 01/18/2024.  She is alert oriented and cooperative.  She maintains good eye contact and reports that her tremor and confusion is improving although she has cognitive impairment at baseline.  When interviewed, patient participated appropriately in her mental status examination.  She is alert oriented cooperative and pleasant.  She denies any current symptoms but continues to obsess over the fact that she is not sure if she took extra lithium  or not.  She denies any active  SI/HI/AVH and only endorses mild depression.  She acknowledges mild cognitive impairment with mild memory loss and periodic confusion but denies any long-term memory loss.  01/20/2024: The client expressed frustration as she was planning on leaving.  I thought I was going home and I'm ready.  This provider spoke with her nurse who stated they restarted her Lithium  at 300 mg BID this am and running a bolus for two hours.  The MD confirmed she should go home after this.  This information communicated to the patient who expressed relief.   She sees Dr. Donnette in outpatient and will follow up.  No threat to herself or others and voiced she did not need anything psychiatrically at this  time, psych will sign off.  Psych ROS:  Depression: History of depression Anxiety: Mild anxiety Mania (lifetime and current): None in the last several years Psychosis: (lifetime and current): Denies  Collateral information:  Contacted from daughter who is present in the room on 01/18/2024  Review of Systems  Psychiatric/Behavioral:  Positive for depression. The patient is nervous/anxious.      Psychiatric and Social History  Psychiatric History:  Information collected from patient and medical records  Prev Dx/Sx: Bipolar disorder and borderline personality disorder Current Psych Provider: Dr. Mercy Home Meds (current): Please see records Previous Med Trials: Please see records Therapy: Has a therapist  Prior Psych Hospitalization: Multiple in the past none recently Prior Self Harm: Denies Prior Violence: Denies  Family Psych History: Denies Family Hx suicide: Denies  Social History:  Patient reports that she lives with her boyfriend of 4 years and her daughter.  Denies any current issues or conflicts. Access to weapons/lethal means: Denies  Substance History None reported, please see medical records for prior history  Exam Findings  Physical Exam: Per H&P Vital Signs:  Temp:  [97.8 F (36.6 C)-98.2 F (36.8 C)] 98.2 F (36.8 C) (11/14 0349) Pulse Rate:  [58-66] 63 (11/14 0349) Resp:  [16-18] 18 (11/14 0349) BP: (142-175)/(75-93) 142/75 (11/14 0349) SpO2:  [100 %] 100 % (11/14 0349) Blood pressure (!) 142/75, pulse 63, temperature 98.2 F (36.8 C), temperature source Oral, resp. rate 18, height 5' 6.5 (1.689 m), weight 93.9 kg, last menstrual period 04/08/2022, SpO2 100%. Body mass index is 32.91 kg/m.  Physical Exam Neurological:     Mental Status: She is oriented to person, place, and time. Mental status is at baseline.     Mental Status Exam: General Appearance: Casual  Orientation:  Full (Time, Place, and Person)  Memory:  Immediate;    Fair Recent;   Fair Remote;   Fair  Concentration:  Concentration: Fair and Attention Span: Fair  Recall:  Fair  Attention  Fair  Eye Contact:  Fair  Speech:  WDL  Language:  Good  Volume:  WDL  Mood: frustrated  Affect:  Appropriate  Thought Process:  Linear  Thought Content:  Logical  Suicidal Thoughts:  No  Homicidal Thoughts:  No  Judgement:  Fair  Insight:  Fair  Psychomotor Activity:  Normal  Akathisia:  No  Fund of Knowledge:  Fair      Assets:  Communication Skills Desire for Improvement  Cognition:  Impaired,  Mild  ADL's:  Intact  AIMS (if indicated):        Other History   These have been pulled in through the EMR, reviewed, and updated if appropriate.  Family History:  The patient's family history includes Alcohol abuse in her maternal uncle; Depression in her maternal  uncle; Diabetes in her father, maternal grandfather, and mother; Personality disorder in her mother.  Medical History: Past Medical History:  Diagnosis Date   Allergy    Anxiety    Asthma    Bipolar disorder (HCC)    Diabetes mellitus    Diabetes mellitus, type II (HCC)    Gallstone    GERD (gastroesophageal reflux disease)    History of borderline personality disorder    Hypercholesteremia    Hypertension    Obesity    Psoriasis    Psoriasis    Seasonal allergies    Thyroid  disease     Surgical History: Past Surgical History:  Procedure Laterality Date   CHOLECYSTECTOMY N/A 10/02/2021   Procedure: LAPAROSCOPIC CHOLECYSTECTOMY;  Surgeon: Evonnie Dorothyann LABOR, DO;  Location: [facility];  Service: General;  Laterality: N/A;   TONSILLECTOMY     TUBAL LIGATION  2 years ago     Medications:   Current Facility-Administered Medications:    0.9 %  sodium chloride  infusion, , Intravenous, Continuous, Regalado, Belkys A, MD, Last Rate: 100 mL/hr at 01/19/24 1813, New Bag at 01/19/24 1813   acetaminophen  (TYLENOL ) tablet 650 mg, 650 mg, Oral, Q6H PRN **OR** acetaminophen  (TYLENOL )  suppository 650 mg, 650 mg, Rectal, Q6H PRN, Laurita Manor T, MD   albuterol  (PROVENTIL ) (2.5 MG/3ML) 0.083% nebulizer solution 2.5 mg, 2.5 mg, Nebulization, Q6H PRN, Laurita Manor T, MD   amoxicillin -clavulanate (AUGMENTIN ) 875-125 MG per tablet 1 tablet, 1 tablet, Oral, Q12H, Regalado, Belkys A, MD, 1 tablet at 01/20/24 0905   artificial tears ophthalmic solution 1 drop, 1 drop, Both Eyes, TID, Laurita Manor T, MD, 1 drop at 01/20/24 0905   azelastine (ASTELIN) 0.1 % nasal spray 2 spray, 2 spray, Each Nare, BID, Zhang, Ping T, MD, 2 spray at 01/20/24 9094   bacitracin-polymyxin b (POLYSPORIN) ophthalmic ointment, , Left Eye, TID, Regalado, Belkys A, MD, Given at 01/20/24 0906   benazepril  (LOTENSIN ) tablet 20 mg, 20 mg, Oral, Daily, Zhang, Ping T, MD, 20 mg at 01/20/24 9095   busPIRone  (BUSPAR ) tablet 30 mg, 30 mg, Oral, BID, Zhang, Ping T, MD, 30 mg at 01/20/24 0905   ciprofloxacin (CIPRO) tablet 500 mg, 500 mg, Oral, BID, Regalado, Belkys A, MD, 500 mg at 01/20/24 0904   clobetasol  cream (TEMOVATE ) 0.05 %, , Topical, BID, Laurita Manor DASEN, MD, Given at 01/20/24 9093   famotidine  (PEPCID ) tablet 20 mg, 20 mg, Oral, BID, Zhang, Ping T, MD, 20 mg at 01/20/24 0904   feeding supplement (ENSURE PLUS HIGH PROTEIN) liquid 237 mL, 237 mL, Oral, BID BM, Laurita Manor T, MD, 237 mL at 01/18/24 1712   fluticasone  (FLONASE ) 50 MCG/ACT nasal spray 2 spray, 2 spray, Each Nare, Daily, Laurita Manor T, MD, 2 spray at 01/20/24 9093   gabapentin  (NEURONTIN ) capsule 100 mg, 100 mg, Oral, BID, Laurita Manor T, MD, 100 mg at 01/20/24 0904   guaiFENesin (MUCINEX) 12 hr tablet 1,200 mg, 1,200 mg, Oral, BID, Regalado, Belkys A, MD, 1,200 mg at 01/20/24 9095   hydrOXYzine  (ATARAX ) tablet 12.5-25 mg, 12.5-25 mg, Oral, BID PRN, Zhang, Ping T, MD   insulin aspart (novoLOG) injection 0-9 Units, 0-9 Units, Subcutaneous, TID WC, Regalado, Belkys A, MD, 2 Units at 01/20/24 0902   lidocaine  (LIDODERM ) 5 % 1 patch, 1 patch, Transdermal, Q24H,  Zhang, Manor T, MD   lithium  carbonate capsule 300 mg, 300 mg, Oral, BID WC, Regalado, Belkys A, MD, 300 mg at 01/20/24 0904   loratadine (CLARITIN) tablet 10 mg,  10 mg, Oral, Daily, Laurita Manor T, MD, 10 mg at 01/20/24 9094   lurasidone  (LATUDA ) tablet 40 mg, 40 mg, Oral, Q supper, Laurita Manor T, MD, 40 mg at 01/19/24 2142   nicotine  (NICODERM CQ  - dosed in mg/24 hours) patch 21 mg, 21 mg, Transdermal, Daily PRN, Regalado, Belkys A, MD   nystatin (MYCOSTATIN) 100000 UNIT/ML suspension 500,000 Units, 5 mL, Oral, QID, Regalado, Belkys A, MD, 500,000 Units at 01/20/24 0905   ondansetron  (ZOFRAN ) tablet 4 mg, 4 mg, Oral, Q6H PRN **OR** ondansetron  (ZOFRAN ) injection 4 mg, 4 mg, Intravenous, Q6H PRN, Laurita Manor T, MD   traZODone  (DESYREL ) tablet 150 mg, 150 mg, Oral, QHS, Laurita Manor T, MD, 150 mg at 01/19/24 2142  Allergies: No Known Allergies  Sharlot Becker, NP

## 2024-01-20 NOTE — Discharge Instructions (Signed)
 Left Breast wound; cleanse with Vashe #[id number] and leave soaking over wound for 10 minutes, pat dry but do not rinse, then place folded Xeroform yellow gauze #[id number], cover with small Mepilex foam or 4x4 gauze taped in place.  You have two different antibiotics: Ciprofloxacin for 2 more days to treat Urine infection.  And Augmentin  for 5 days to treat wound infection under your breast.   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

## 2024-01-20 NOTE — Telephone Encounter (Signed)
 Return call to patient to advise that she has an order for MM already in our system that was order by Newlin, Enobong, MD and modified by Silas, Ansyi K, CMA so she should be able to proceed with her MM. Unable to reach VM left.

## 2024-01-21 LAB — VITAMIN B1: Vitamin B1 (Thiamine): 139.3 nmol/L (ref 66.5–200.0)

## 2024-01-21 LAB — T3, FREE: T3, Free: 2.6 pg/mL (ref 2.0–4.4)

## 2024-01-22 LAB — AEROBIC CULTURE W GRAM STAIN (SUPERFICIAL SPECIMEN): Gram Stain: NONE SEEN

## 2024-01-23 ENCOUNTER — Telehealth: Payer: Self-pay

## 2024-01-23 NOTE — Telephone Encounter (Signed)
 Noted

## 2024-01-23 NOTE — Transitions of Care (Post Inpatient/ED Visit) (Signed)
   01/23/2024  Name: Nicholl S Caruth MRN: [id number] DOB: 06/12/1975  Today's TOC FU Call Status: Today's TOC FU Call Status:: Unsuccessful Call (1st Attempt) Unsuccessful Call (1st Attempt) Date: 01/23/24  Attempted to reach the patient regarding the most recent Inpatient/ED visit.  Follow Up Plan: Additional outreach attempts will be made to reach the patient to complete the Transitions of Care (Post Inpatient/ED visit) call.   Signature  Slater Diesel, RN

## 2024-01-24 ENCOUNTER — Other Ambulatory Visit: Payer: Self-pay

## 2024-01-24 ENCOUNTER — Telehealth: Payer: Self-pay

## 2024-01-24 DIAGNOSIS — I10 Essential (primary) hypertension: Secondary | ICD-10-CM

## 2024-01-24 DIAGNOSIS — E1165 Type 2 diabetes mellitus with hyperglycemia: Secondary | ICD-10-CM

## 2024-01-24 DIAGNOSIS — H0015 Chalazion left lower eyelid: Secondary | ICD-10-CM | POA: Diagnosis not present

## 2024-01-24 DIAGNOSIS — H0014 Chalazion left upper eyelid: Secondary | ICD-10-CM | POA: Diagnosis not present

## 2024-01-24 MED ORDER — NEOMYCIN-POLYMYXIN-DEXAMETH 3.5-10000-0.1 OP OINT
TOPICAL_OINTMENT | OPHTHALMIC | 1 refills | Status: AC
  Filled 2024-01-24: qty 3.5, 10d supply, fill #0

## 2024-01-24 MED ORDER — DOXYCYCLINE HYCLATE 100 MG PO TABS
100.0000 mg | ORAL_TABLET | Freq: Every day | ORAL | 1 refills | Status: AC
  Filled 2024-01-24: qty 30, 30d supply, fill #0

## 2024-01-24 NOTE — Transitions of Care (Post Inpatient/ED Visit) (Signed)
   01/24/2024  Name: Valerie Reilly MRN: [id number] DOB: Oct 08, 1975  Today's TOC FU Call Status: Today's TOC FU Call Status:: Unsuccessful Call (2nd Attempt) Unsuccessful Call (1st Attempt) Date: 01/23/24 Unsuccessful Call (2nd Attempt) Date: 01/24/24  Attempted to reach the patient regarding the most recent Inpatient/ED visit.  Follow Up Plan: Additional outreach attempts will be made to reach the patient to complete the Transitions of Care (Post Inpatient/ED visit) call.   Signature  Slater Diesel, RN

## 2024-01-24 NOTE — Progress Notes (Unsigned)
 BH MD Outpatient Progress Note  01/26/2024 11:54 AM Valerie Reilly  MRN:  [id number]  Assessment:  Valerie Reilly presents for follow-up evaluation. Today, 01/26/24, patient is seen for psychiatric follow-up after recent medical hospitalization for lithium  toxicity related to unintentional overdose. Patient is unable to recall taking more lithium  than prescribed but given noted mild cognitive impairment, concern that she may have accidentally taken higher dose. Since discharge, daughter has been providing medication supervision and putting medication in pill box for her. She was encouraged to continue this for time being. Patient has initial neurology appointment in January for further evaluation of cognitive impairment. She was restarted on low-dose lithium  prior to discharge once safe lithium  level was achieved; will repeat lithium  level to ensure continued therapeutic level since restarting. She denies any signs/sx of lithium  toxicity today and endorses overall stability of mood. As she is sleeping well without nightly need for trazodone , will trial reduction in dose as below. No other changes at this time.   Patient was made aware of this provider's departure from [facility] at the end of Nov 2025 and that she will be transitioned to alternative provider in the clinic after this time. All questions/concerns addressed.  RTC in 4-6 weeks with next provider.  Identifying Information: Valerie Reilly is a 48 y.o. female with a history of bipolar 1 disorder, borderline personality disorder, GAD, T2DM, hypothyroidism on Synthroid , HTN, and HLD who is an established patient with [facility] participating in follow-up via video conferencing. On further review of historical bipolar diagnosis, this diagnosis is felt to be accurate as patient reports history of discrete manic episodes requiring hospitalization during periods of non-adherence to lithium .   Plan:  # Bipolar 1  disorder currently euthymic  GAD # Borderline personality disorder Past medication trials: Effexor, Depakote (brief) Status of problem: stable Interventions: -- Continue lithium  300 mg BID   -- Patient previously on 900 mg at bedtime; continue to monitor for need to return to this dosing -- Continue Latuda  40 mg qEvening with supper (s5/7/25, i7/3/25) -- Continue buspirone  30 mg 2 times daily -- Continue gabapentin  100 mg BID -- Continue Atarax  12.5-25 mg BID PRN anxiety (using rarely) -- Continue individual psychotherapy with Bernice Rao [facility]  # Mild cognitive impairment Status of problem: acute Interventions: -- MoCA performed 11/01/23 with score of 19/30 consistent with mild NCI -- Referral previously placed to neurology with appointment scheduled 03/27/24 -- Daughter managing medications given recent medication error and concern for cognitive impairment -- Will continue to taper medications that may be contributing to cognitive dulling as tolerated (trazodone , gabapentin )  # Sleep regulation Past medication trials: unknown Status of problem: stable Interventions: -- DECREASE trazodone  to 75-150 mg at bedtime PRN sleep  # High risk medication use  Recent lithium  toxicity Interventions: -- Lithium :  -- Kidney function within normal limits 01/18/24  -- TSH slightly elevated 01/19/24; free T4 and T3 wnl  -- Lithium  level 0.8 06/29/23 -> 1.88 01/17/24 -> 0.57 01/20/24; lithium  level reordered given recent hospitalization for toxicity  Patient was given contact information for behavioral health clinic and was instructed to call 911 for emergencies.   Subjective:  Chief Complaint:  Chief Complaint  Patient presents with   Medication Management    Interval History:   Chart review: -- Medical admission 01/17/24-01/20/24 for accidental lithium  toxicity. Lithium  level elevated to 1.7 on presentation. Lithium  held and restarted at 300 mg BID once level reached 0.57 01/20/24.  Reports she is feeling better since getting out of the hospital with resolution of prior physical adverse effects from lithium  toxicity (confusion, tremulousness). Unsure what transpired to result in her taking more than prescribed; does not remember doing this.    Reports mood has been not too bad and denies persistent periods of depression or excessively elevated mood; irritability. Reports manageable level of anxiety. Sleeping fairly well with use of trazodone  but doesn't need it every night. Amenable to trialing reduction in trazodone  to 75-150 mg nightly PRN.   Denies passive/active SI; denies AVH.   States daughter has been providing medication supervision and putting medications in pill box for her.  Amenable to obtaining updated lithium  level, prior to morning lithium  dose and otherwise continuing medications as prescribed.  Visit Diagnosis:    ICD-10-CM   1. Bipolar I disorder (HCC)  F31.9 traZODone  (DESYREL ) 150 MG tablet    2. GAD (generalized anxiety disorder)  F41.1 busPIRone  (BUSPAR ) 30 MG tablet    gabapentin  (NEURONTIN ) 100 MG capsule    3. Insomnia due to other mental disorder  F51.05 traZODone  (DESYREL ) 150 MG tablet   F99     4. High risk medication use  Z79.899 Lithium  level    5. Mild cognitive impairment  G31.84     6. Borderline personality disorder (HCC)  F60.3        Past Psychiatric History:  Diagnoses: Bipolar 1 disorder, borderline personality disorder, cannabis use disorder in remission Medication trials: Effexor, Depakote (brief) Hospitalizations: multiple - last hospitalization at [facility] in 2014 for episode of mania Suicide attempt: yes - many years ago; hx of 2-3 previous attempts  Access to guns: denies Substance use:   -- Etoh: denies  -- Denies use of cannabis or other illicit drugs  -- Tobacco: 2 ppd; precontemplative regarding cessation  Past Medical History:  Past Medical History:  Diagnosis Date   Allergy    Anxiety    Asthma     Bipolar disorder (HCC)    Diabetes mellitus    Diabetes mellitus, type II (HCC)    Gallstone    GERD (gastroesophageal reflux disease)    History of borderline personality disorder    Hypercholesteremia    Hypertension    Obesity    Psoriasis    Psoriasis    Seasonal allergies    Thyroid  disease     Past Surgical History:  Procedure Laterality Date   CHOLECYSTECTOMY N/A 10/02/2021   Procedure: LAPAROSCOPIC CHOLECYSTECTOMY;  Surgeon: Evonnie Dorothyann LABOR, DO;  Location: [facility];  Service: General;  Laterality: N/A;   TONSILLECTOMY     TUBAL LIGATION  2 years ago    Family Psychiatric History:  Maternal uncle: depression, alcohol use Mother: personality disorder  Family History:  Family History  Problem Relation Age of Onset   Personality disorder Mother    Diabetes Mother    Diabetes Father    Alcohol abuse Maternal Uncle    Depression Maternal Uncle    Diabetes Maternal Grandfather    Breast cancer Neg Hx    Stomach cancer Neg Hx    Colon cancer Neg Hx    Esophageal cancer Neg Hx    Colon polyps Neg Hx    Crohn's disease Neg Hx    Ulcerative colitis Neg Hx    Rectal cancer Neg Hx     Social History:  Social History   Socioeconomic History   Marital status: Divorced    Spouse name: Not on file   Number of children:  1   Years of education: Not on file   Highest education level: Associate degree: occupational, scientist, product/process development, or vocational program  Occupational History   Occupation: delivery newspaper  Tobacco Use   Smoking status: Every Day    Current packs/day: 2.00    Average packs/day: 2.0 packs/day for 30.0 years (60.0 ttl pk-yrs)    Types: Cigarettes    Passive exposure: Current   Smokeless tobacco: Never  Vaping Use   Vaping status: Never Used  Substance and Sexual Activity   Alcohol use: Yes    Comment: occasional- a few times a year   Drug use: No    Types: Marijuana    Comment: last time was 3 yrs ago   Sexual activity: Yes    Birth  control/protection: Surgical    Comment: Tubal ligation  Other Topics Concern   Not on file  Social History Narrative   Not on file   Social Drivers of Health   Financial Resource Strain: High Risk (12/01/2023)   Overall Financial Resource Strain (CARDIA)    Difficulty of Paying Living Expenses: Very hard  Food Insecurity: No Food Insecurity (01/17/2024)   Hunger Vital Sign    Worried About Running Out of Food in the Last Year: Never true    Ran Out of Food in the Last Year: Never true  Recent Concern: Food Insecurity - Food Insecurity Present (12/16/2023)   Hunger Vital Sign    Worried About Running Out of Food in the Last Year: Sometimes true    Ran Out of Food in the Last Year: Sometimes true  Transportation Needs: No Transportation Needs (01/17/2024)   PRAPARE - Administrator, Civil Service (Medical): No    Lack of Transportation (Non-Medical): No  Physical Activity: Inactive (12/15/2022)   Exercise Vital Sign    Days of Exercise per Week: 0 days    Minutes of Exercise per Session: 0 min  Stress: No Stress Concern Present (12/15/2022)   [employer] of Occupational Health - Occupational Stress Questionnaire    Feeling of Stress : Not at all  Social Connections: Socially Isolated (12/15/2022)   Social Connection and Isolation Panel    Frequency of Communication with Friends and Family: More than three times a week    Frequency of Social Gatherings with Friends and Family: Once a week    Attends Religious Services: Never    Database Administrator or Organizations: No    Attends Engineer, Structural: Never    Marital Status: Divorced    Allergies: No Known Allergies  Current Medications: Current Outpatient Medications  Medication Sig Dispense Refill   albuterol  (VENTOLIN  HFA) 108 (90 Base) MCG/ACT inhaler Inhale 2 puffs into the lungs every 6 (six) hours as needed for wheezing or shortness of breath. 6.7 g 2   azelastine (ASTELIN) 0.1 % nasal  spray Place 2 sprays into both nostrils 2 (two) times daily. Use in each nostril as directed (Patient taking differently: Place 2 sprays into both nostrils 2 (two) times daily.) 30 mL 12   benazepril  (LOTENSIN ) 20 MG tablet Take 1 tablet (20 mg total) by mouth daily. 90 tablet 1   busPIRone  (BUSPAR ) 30 MG tablet Take 1 tablet (30 mg total) by mouth 2 (two) times daily. 180 tablet 1   cetirizine (ZYRTEC) 10 MG tablet Take 10 mg by mouth in the morning.     cholecalciferol (VITAMIN D ) 1000 UNITS tablet Take 1,000 Units by mouth daily.     clobetasol  (  TEMOVATE ) 0.05 % external solution Apply 1 (one) mL two times daily to SCALP. after 14 days reduce use to 2 days per week. (Patient taking differently: 1 Application See admin instructions. Apply to scalp 2 times a week) 50 mL 0   clobetasol  cream (TEMOVATE ) 0.05 % Apply 1 (one) Application two times a day, 2 days per week 60 g 0   doxycycline (VIBRA-TABS) 100 MG tablet Take 1 tablet (100 mg total) by mouth daily. 30 tablet 1   estradiol (ESTRACE VAGINAL) 0.1 MG/GM vaginal cream Place 1 gram vaginally at bedtime. Every night for 2 weeks and then twice a week aftewards (Patient taking differently: Place 1 Applicatorful vaginally 2 (two) times a week.) 42.5 g 12   famotidine  (PEPCID ) 20 MG tablet Take 1 tablet (20 mg total) by mouth 2 (two) times daily. (Patient taking differently: Take 20 mg by mouth in the morning and at bedtime.) 180 tablet 1   fluocinonide  (LIDEX ) 0.05 % external solution Apply 1 Application topically 2 (two) times daily as needed 120 mL 0   fluticasone  (FLONASE ) 50 MCG/ACT nasal spray Place 2 sprays into both nostrils daily. 16 g 2   gabapentin  (NEURONTIN ) 100 MG capsule Take 1 capsule (100 mg total) by mouth 2 (two) times daily. 180 capsule 1   glipiZIDE  (GLUCOTROL  XL) 10 MG 24 hr tablet Take 1 tablet (10 mg total) by mouth daily with breakfast. 90 tablet 1   guaiFENesin (MUCINEX) 600 MG 12 hr tablet Take 2 tablets (1,200 mg total) by  mouth 2 (two) times daily. 120 tablet 0   hydroxypropyl methylcellulose / hypromellose (ISOPTO TEARS / GONIOVISC) 2.5 % ophthalmic solution Place 1 drop into both eyes in the morning and at bedtime. 15 mL 3   hydrOXYzine  (ATARAX ) 25 MG tablet Take 0.5-1 tablets (12.5-25 mg total) by mouth 2 (two) times daily as needed for anxiety. (Patient not taking: Reported on 01/26/2024) 90 tablet 1   levothyroxine  (SYNTHROID ) 50 MCG tablet Take 50 mcg by mouth daily before breakfast.     lidocaine  (LIDODERM ) 5 % Place 1 patch onto the skin daily. Remove & Discard patch within 12 hours daily, to lower back 30 patch 0   lithium  carbonate 300 MG capsule Take 1 capsule (300 mg total) by mouth 2 (two) times daily with a meal. 60 capsule 2   lurasidone  (LATUDA ) 40 MG TABS tablet Take 1 tablet (40 mg total) by mouth daily with supper. Should be taken with food (at least 350 calories). 90 tablet 1   metFORMIN  (GLUCOPHAGE ) 1000 MG tablet Take 1,000 mg by mouth 2 (two) times daily with a meal.     methotrexate  (RHEUMATREX) 2.5 MG tablet Take 1 tablet by mouth every 12 hours for 3 doses per week. (only 3 tablets per week) 12 tablet 0   Multiple Vitamin (MULTIVITAMIN) capsule Take 1 capsule by mouth daily with breakfast.     neomycin-polymyxin b-dexamethasone  (MAXITROL) 3.5-10000-0.1 OINT Apply 1/4 inch into left eye every night at bedtime 3.5 g 1   Nicotine  (NICODERM CQ  TD) Place 1 patch onto the skin daily as needed (for smoking cessation while hospitalized).     nystatin (MYCOSTATIN) 100000 UNIT/ML suspension Take 5 mLs (500,000 Units total) by mouth 4 (four) times daily. 60 mL 0   simvastatin  (ZOCOR ) 40 MG tablet Take 1 tablet (40 mg total) by mouth daily. 90 tablet 1   traZODone  (DESYREL ) 150 MG tablet Take 0.5-1 tablets (75-150 mg total) by mouth at bedtime. 90 tablet 1  No current facility-administered medications for this visit.    ROS: See above  Objective:  Psychiatric Specialty Exam: Last menstrual  period 04/08/2022.There is no height or weight on file to calculate BMI.  General Appearance: Casual and Fairly Groomed  Eye Contact:  Good  Speech:  Clear and Coherent and Normal Rate  Volume:  Normal  Mood:  not bad  Affect:  Euthymic; calm  Thought Content: Denies AVH; IOR; paranoia   Suicidal Thoughts:  Denies passive/active SI  Homicidal Thoughts:  No  Thought Process:  Goal Directed and Linear  Orientation:  Full (Time, Place, and Person)    Memory:  Grossly oriented  Judgment:  Good  Insight:  Fair  Concentration:  Concentration: Good  Recall:  NA  Fund of Knowledge: Good  Language: Good  Psychomotor Activity:  Normal  Akathisia:  NA  AIMS (if indicated): not done  Assets:  Communication Skills Desire for Improvement Housing Intimacy Leisure Time Social Support Vocational/Educational  ADL's:  Intact  Cognition: Impaired,  Mild. MoCA 19/30 11/01/23.  Sleep:  Good   PE: General: sits comfortably in view of camera; no acute distress  Pulm: no increased work of breathing on room air  MSK: all extremity movements appear intact  Neuro: no focal neurological deficits observed  Gait & Station: unable to assess by video   Metabolic Disorder Labs: Lab Results  Component Value Date   HGBA1C 6.4 (H) 01/18/2024   MPG 136.98 01/18/2024   No results found for: PROLACTIN Lab Results  Component Value Date   CHOL 135 12/15/2022   TRIG 109 12/15/2022   HDL 41 12/15/2022   CHOLHDL 3.3 12/15/2022   LDLCALC 74 12/15/2022   LDLCALC 54 06/10/2022   Lab Results  Component Value Date   TSH 5.920 (H) 01/19/2024   TSH 1.630 04/13/2023    Therapeutic Level Labs: Lab Results  Component Value Date   LITHIUM  0.57 (L) 01/20/2024   LITHIUM  0.90 01/19/2024   No results found for: VALPROATE No results found for: CBMZ  Screenings:  AUDIT    Flowsheet Row Office Visit from 06/10/2022 in [facility] [place]. [facility]   Alcohol  Use Disorder Identification Test Final Score (AUDIT) 5   GAD-7    Flowsheet Row Office Visit from 01/10/2024 in [facility] [place] [facility] Dept Of [place]. [facility] Patient Outreach Telephone from 12/16/2023 in [facility] HEALTH POPULATION HEALTH DEPARTMENT Office Visit from 12/14/2023 in [facility] Patient Outreach Telephone from 12/05/2023 in [facility] POPULATION HEALTH DEPARTMENT Office Visit from 10/26/2023 in [facility]  Total GAD-7 Score 0 0 0 1 1   PHQ2-9    Flowsheet Row Office Visit from 01/10/2024 in [facility] [place] - A Dept Of [place]. [facility] Patient Outreach Telephone from 12/16/2023 in [place] POPULATION HEALTH DEPARTMENT Office Visit from 12/14/2023 in [facility] Patient Outreach Telephone from 12/05/2023 in [place] HEALTH POPULATION HEALTH DEPARTMENT Office Visit from 10/26/2023 in [facility]  PHQ-2 Total Score 0 0 0 0 0  PHQ-9 Total Score 0 0 0 -- --   Flowsheet Row ED to Hosp-Admission (Discharged) from 01/17/2024 in [place] [facility] Video Visit from 11/05/2021 in [facility] Pre-Admission Testing 60 from 09/29/2021 in [place]  MEDICAL/SURGICAL DAY  C-SSRS RISK CATEGORY No Risk No Risk No Risk   MoCA 11/01/23: score of 19/30 (see scanned media tab)  Collaboration of Care: Collaboration of Care: Medication Management AEB ongoing medication management, Psychiatrist AEB established with this provider, and Referral or follow-up with counselor/therapist AEB established with individual therapist  Patient/Guardian was advised Release of Information must be obtained prior to any record release in order to collaborate their care with an outside provider. Patient/Guardian was advised if they have not already  done so to contact the registration department to sign all necessary forms in order for us  to release information regarding their care.   Consent: Patient/Guardian gives verbal consent for treatment and assignment of benefits for services provided during this visit. Patient/Guardian expressed understanding and agreed to proceed.   Virtual Visit via Video Note  I connected with Valerie Reilly on 01/26/24 at 11:30 AM EST by a video enabled telemedicine application and verified that I am speaking with the correct person using two identifiers.  Location: Patient: home address in [place] Provider: remote office in [place]   I discussed the limitations of evaluation and management by telemedicine and the availability of in person appointments. The patient expressed understanding and agreed to proceed.  I discussed the assessment and treatment plan with the patient. The patient was provided an opportunity to ask questions and all were answered. The patient agreed with the plan and demonstrated an understanding of the instructions.   The patient was advised to call back or seek an in-person evaluation if the symptoms worsen or if the condition fails to improve as anticipated.  I provided 30 minutes dedicated to the care of this patient via video on the date of this encounter to include chart review, face-to-face time with the patient, medication management/counseling.  Jannet Calip A Blondine Hottel 01/26/2024, 11:54 AM

## 2024-01-25 ENCOUNTER — Telehealth: Payer: Self-pay

## 2024-01-25 NOTE — Transitions of Care (Post Inpatient/ED Visit) (Signed)
   01/25/2024  Name: Valerie Reilly MRN: [id number] DOB: 24-Nov-1975  Today's TOC FU Call Status: Today's TOC FU Call Status:: Unsuccessful Call (3rd Attempt) Unsuccessful Call (1st Attempt) Date: 01/23/24 Unsuccessful Call (2nd Attempt) Date: 01/24/24 Unsuccessful Call (3rd Attempt) Date: 01/25/24  Attempted to reach the patient regarding the most recent Inpatient/ED visit.  Follow Up Plan: Additional outreach attempts will be made to reach the patient to complete the Transitions of Care (Post Inpatient/ED visit) call.   She has an appointment with Haze Servant, NP at [facility] on 02/20/2024  Signature  Slater Diesel, RN

## 2024-01-26 ENCOUNTER — Encounter (HOSPITAL_COMMUNITY): Payer: Self-pay | Admitting: Psychiatry

## 2024-01-26 ENCOUNTER — Other Ambulatory Visit: Payer: Self-pay

## 2024-01-26 ENCOUNTER — Telehealth (INDEPENDENT_AMBULATORY_CARE_PROVIDER_SITE_OTHER): Admitting: Psychiatry

## 2024-01-26 DIAGNOSIS — Z79899 Other long term (current) drug therapy: Secondary | ICD-10-CM

## 2024-01-26 DIAGNOSIS — F99 Mental disorder, not otherwise specified: Secondary | ICD-10-CM | POA: Diagnosis not present

## 2024-01-26 DIAGNOSIS — F5105 Insomnia due to other mental disorder: Secondary | ICD-10-CM | POA: Diagnosis not present

## 2024-01-26 DIAGNOSIS — F603 Borderline personality disorder: Secondary | ICD-10-CM | POA: Diagnosis not present

## 2024-01-26 DIAGNOSIS — F319 Bipolar disorder, unspecified: Secondary | ICD-10-CM | POA: Diagnosis not present

## 2024-01-26 DIAGNOSIS — G3184 Mild cognitive impairment, so stated: Secondary | ICD-10-CM | POA: Diagnosis not present

## 2024-01-26 DIAGNOSIS — F411 Generalized anxiety disorder: Secondary | ICD-10-CM | POA: Diagnosis not present

## 2024-01-26 MED ORDER — BUSPIRONE HCL 30 MG PO TABS
30.0000 mg | ORAL_TABLET | Freq: Two times a day (BID) | ORAL | 1 refills | Status: DC
  Filled 2024-01-26: qty 180, 90d supply, fill #0
  Filled 2024-02-06: qty 60, 30d supply, fill #0

## 2024-01-26 MED ORDER — LURASIDONE HCL 40 MG PO TABS
40.0000 mg | ORAL_TABLET | Freq: Every day | ORAL | 1 refills | Status: DC
  Filled 2024-01-26: qty 90, 90d supply, fill #0
  Filled 2024-02-06: qty 30, 30d supply, fill #0

## 2024-01-26 MED ORDER — LITHIUM CARBONATE 300 MG PO CAPS
300.0000 mg | ORAL_CAPSULE | Freq: Two times a day (BID) | ORAL | 2 refills | Status: DC
  Filled 2024-01-26: qty 60, 30d supply, fill #0

## 2024-01-26 MED ORDER — GABAPENTIN 100 MG PO CAPS
100.0000 mg | ORAL_CAPSULE | Freq: Two times a day (BID) | ORAL | 1 refills | Status: DC
  Filled 2024-01-26 – 2024-02-06 (×2): qty 180, 90d supply, fill #0

## 2024-01-26 MED ORDER — TRAZODONE HCL 150 MG PO TABS
75.0000 mg | ORAL_TABLET | Freq: Every day | ORAL | 1 refills | Status: DC
  Filled 2024-01-26: qty 90, 90d supply, fill #0

## 2024-01-26 NOTE — Patient Instructions (Signed)
 Thank you for attending your appointment today.  -- DECREASE trazodone  to 75-150 mg nightly as needed for sleep -- Continue other medications as prescribed.  Please do not make any changes to medications without first discussing with your provider. If you are experiencing a psychiatric emergency, please call 911 or present to your nearest emergency department. Additional crisis, medication management, and therapy resources are included below.  [facility]  [address], [place], [place] [address] [phone] WALK-IN URGENT CARE 24/7 FOR ANYONE [address], [place], [place]  [phone] Fax: [phone] [email or web address] *Interpreters available *Accepts all insurance and uninsured for Urgent Care needs *Accepts Medicaid and uninsured for outpatient treatment (below)      ONLY FOR [facility]  Below:    Outpatient New Patient Assessment/Therapy Walk-ins:        Monday, Wednesday, and Thursday 8am until slots are full (first come, first served)                   New Patient Psychiatry/Medication Management        Monday-Friday 8am-11am (first come, first served)               For all walk-ins we ask that you arrive by 7:15am, because patients will be seen in the order of arrival.

## 2024-01-27 ENCOUNTER — Other Ambulatory Visit: Payer: Self-pay

## 2024-01-27 ENCOUNTER — Other Ambulatory Visit: Payer: Self-pay | Admitting: Family Medicine

## 2024-01-27 DIAGNOSIS — E039 Hypothyroidism, unspecified: Secondary | ICD-10-CM

## 2024-01-27 DIAGNOSIS — L400 Psoriasis vulgaris: Secondary | ICD-10-CM | POA: Diagnosis not present

## 2024-01-27 MED ORDER — LEVOTHYROXINE SODIUM 50 MCG PO TABS
50.0000 ug | ORAL_TABLET | Freq: Every day | ORAL | 1 refills | Status: AC
  Filled 2024-01-27: qty 90, 90d supply, fill #0

## 2024-02-06 ENCOUNTER — Other Ambulatory Visit: Payer: Self-pay

## 2024-02-06 ENCOUNTER — Ambulatory Visit (INDEPENDENT_AMBULATORY_CARE_PROVIDER_SITE_OTHER): Admitting: Mental Health

## 2024-02-06 DIAGNOSIS — F319 Bipolar disorder, unspecified: Secondary | ICD-10-CM | POA: Diagnosis not present

## 2024-02-06 DIAGNOSIS — L400 Psoriasis vulgaris: Secondary | ICD-10-CM | POA: Diagnosis not present

## 2024-02-06 NOTE — Progress Notes (Signed)
   THERAPIST PROGRESS NOTE Virtual Visit via Video Note  I connected with Valerie Reilly on 02/06/24 at  2:00 PM EST by a video enabled telemedicine application and verified that I am speaking with the correct person using two identifiers.  Location: Patient: home address on file Provider: office   I discussed the limitations of evaluation and management by telemedicine and the availability of in person appointments. The patient expressed understanding and agreed to proceed.  I discussed the assessment and treatment plan with the patient. The patient was provided an opportunity to ask questions and all were answered. The patient agreed with the plan and demonstrated an understanding of the instructions.   The patient was advised to call back or seek an in-person evaluation if the symptoms worsen or if the condition fails to improve as anticipated.  I provided 48 minutes of non-face-to-face time during this encounter.   Ty Bernice Savant, [facility]   Session Time: 2:03 pm ( 48 minutes)  Participation Level: Active  Behavioral Response: CasualAlertIrritable  Type of Therapy: Individual Therapy  Treatment Goals addressed:  STG: Deal better. Jabria will increase management of stress AEB ability to process events in balanced way with engagement in at least x 1 coping skills for stress as needed with the next 90 days.   ProgressTowards Goals: Progressing  Interventions: Supportive  Summary: Valerie Reilly is a 48 y.o. female who presents with dx of bipolar disorder, borderline personality disorder and generalized anxiety. Presents to session alert and oriented; mood and affect irritable. Shares for moods I've been ok. Shares for moods to be stable and denies excessive highs or lows but notes recent hospitalization for increased lithium  levels. Reports belief to have been taking medications as prescribed with unintentional overdose but states to have been taking as prescribed  on bottle. Notes use of pill case to monitor medications. Shares to have reapplied for disability and concerns for ability to work. Shares frustrations with difficulty accessing resources. Shares frustration with hospitalization with concern of if she had been transferred for an a behavioral health stay. Shares feelings of frustration with various medical providers and not getting treatment she feels has been beneficial. Shares thoughts on residing with daughter. Notes working to manage stress with resting and support of boyfriend. Denies safety concerns.   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 Completed check in and assessed for current level of functioning, sxs management and stressors. Provided safe space for to share concerns and provided supportive feedback. Supported in processing thoughts on recent hospital visit and validated feelings. Encouraged use of pill containing and advocating for clarity in her treatment. Encouraged ongoing communications with providers. Reviewed ability to engage in use of coping skills and engagement in self-care. Reviewed session and provided follow up.   Plan: Return again in x5 weeks.  Diagnosis: Bipolar I disorder (HCC)  Collaboration of Care: Other None  Patient/Guardian was advised Release of Information must be obtained prior to any record release in order to collaborate their care with an outside provider. Patient/Guardian was advised if they have not already done so to contact the registration department to sign all necessary forms in order for us  to release information regarding their care.   Consent: Patient/Guardian gives verbal consent for treatment and assignment of benefits for services provided during this visit. Patient/Guardian expressed understanding and agreed to proceed.   Ty Bernice Shinnston, [facility] 02/06/2024

## 2024-02-09 ENCOUNTER — Other Ambulatory Visit: Payer: Self-pay

## 2024-02-09 ENCOUNTER — Ambulatory Visit: Payer: Self-pay

## 2024-02-09 DIAGNOSIS — L400 Psoriasis vulgaris: Secondary | ICD-10-CM | POA: Diagnosis not present

## 2024-02-09 MED ORDER — METHOTREXATE SODIUM 2.5 MG PO TABS
2.5000 mg | ORAL_TABLET | Freq: Two times a day (BID) | ORAL | 0 refills | Status: DC
  Filled 2024-02-09: qty 12, 28d supply, fill #0

## 2024-02-15 ENCOUNTER — Other Ambulatory Visit: Payer: Self-pay | Admitting: Nurse Practitioner

## 2024-02-15 ENCOUNTER — Other Ambulatory Visit: Payer: Self-pay

## 2024-02-15 DIAGNOSIS — K219 Gastro-esophageal reflux disease without esophagitis: Secondary | ICD-10-CM

## 2024-02-15 MED ORDER — FAMOTIDINE 20 MG PO TABS
20.0000 mg | ORAL_TABLET | Freq: Two times a day (BID) | ORAL | 1 refills | Status: AC
  Filled 2024-02-15: qty 180, 90d supply, fill #0

## 2024-02-17 ENCOUNTER — Other Ambulatory Visit: Payer: Self-pay | Admitting: *Deleted

## 2024-02-17 NOTE — Patient Instructions (Signed)
 Valerie Reilly - I am sorry I was unable to reach you today for our scheduled appointment. I work with Theotis Haze ORN, NP and am calling to support your healthcare needs.  I have rescheduled your appointment for 03/26/24 @ 1130 am. Please contact me at [phone]. at your earliest convenience. I look forward to speaking with you soon.   Thank you,  Jameila Keeny, RN, BSN, ACM RN Care Manager [employer] [phone]

## 2024-02-20 ENCOUNTER — Other Ambulatory Visit (HOSPITAL_COMMUNITY)
Admission: RE | Admit: 2024-02-20 | Discharge: 2024-02-20 | Disposition: A | Source: Ambulatory Visit | Attending: Nurse Practitioner | Admitting: Nurse Practitioner

## 2024-02-20 ENCOUNTER — Other Ambulatory Visit: Payer: Self-pay

## 2024-02-20 ENCOUNTER — Encounter: Payer: Self-pay | Admitting: Nurse Practitioner

## 2024-02-20 ENCOUNTER — Telehealth: Payer: Self-pay | Admitting: Obstetrics and Gynecology

## 2024-02-20 ENCOUNTER — Ambulatory Visit: Attending: Nurse Practitioner | Admitting: Nurse Practitioner

## 2024-02-20 VITALS — BP 137/79 | HR 77 | Ht 66.5 in | Wt 230.4 lb

## 2024-02-20 DIAGNOSIS — E119 Type 2 diabetes mellitus without complications: Secondary | ICD-10-CM

## 2024-02-20 DIAGNOSIS — Z7984 Long term (current) use of oral hypoglycemic drugs: Secondary | ICD-10-CM

## 2024-02-20 DIAGNOSIS — N760 Acute vaginitis: Secondary | ICD-10-CM | POA: Diagnosis not present

## 2024-02-20 DIAGNOSIS — R350 Frequency of micturition: Secondary | ICD-10-CM | POA: Diagnosis not present

## 2024-02-20 DIAGNOSIS — Z1231 Encounter for screening mammogram for malignant neoplasm of breast: Secondary | ICD-10-CM

## 2024-02-20 MED ORDER — TRUE METRIX BLOOD GLUCOSE TEST VI STRP
ORAL_STRIP | 12 refills | Status: AC
  Filled 2024-02-20: qty 100, 50d supply, fill #0

## 2024-02-20 MED ORDER — ACCU-CHEK GUIDE ME W/DEVICE KIT
1.0000 | PACK | Freq: Once | 0 refills | Status: AC
  Filled 2024-02-20: qty 1, 30d supply, fill #0

## 2024-02-20 MED ORDER — ACCU-CHEK SOFTCLIX LANCETS MISC
3 refills | Status: AC
  Filled 2024-02-20: qty 100, 50d supply, fill #0

## 2024-02-20 NOTE — Progress Notes (Signed)
Needs meter!!!

## 2024-02-20 NOTE — Telephone Encounter (Signed)
 Patient called to reschedule her appointment. She stated the appointment was made without talking to her first, and she can only do morning appointment. I informed her we do not have the schedule ready past February 2nd at the moment.She stated she would call back and schedule later.

## 2024-02-20 NOTE — Patient Instructions (Addendum)
 [facility] [address] [place],  [place]  [address] Main: [phone]

## 2024-02-20 NOTE — Progress Notes (Signed)
 Assessment & Plan:  Valerie Reilly was seen today for medical management of chronic issues.  Diagnoses and all orders for this visit:  Diabetes mellitus treated with oral medication (HCC) -     Urine Albumin/Creatinine with ratio (send out) [LAB689] -     Accu-Chek Softclix Lancets lancets; Use as instructed. Testing twice daily -     glucose blood (TRUE METRIX BLOOD GLUCOSE TEST) test strip; Use as instructed -     Blood Glucose Monitoring Suppl (ACCU-CHEK GUIDE ME) w/Device KIT; Use as directed.  Breast cancer screening by mammogram -     MS 3D SCR MAMMO BILAT BR (aka MM); Future  Acute vaginitis -     Cervicovaginal ancillary only  Increased urinary frequency -     Urinalysis, Complete -     Urine Culture; Future -     Urine Culture    Patient has been counseled on age-appropriate routine health concerns for screening and prevention. These are reviewed and up-to-date. Referrals have been placed accordingly. Immunizations are up-to-date or declined.    Subjective:   Chief Complaint  Patient presents with   Medical Management of Chronic Issues    Lyndal S Lamay 48 y.o. female presents to office today for recurring BV symptoms and urinary frequency.   She was admitted to the hospital for 3 days due to lithium  toxicity on 01-17-2024. Currently taking lithium  300 mg BID and tolerating well.  Notes increased urinary frequency over the past few weeks as well as recurring symptoms of bacterial vaginitis.   She had a previous abscess under the left breast that required I&D. Site is healed at this time with no signs of cellulitis or abscess.   She inquires about monitoring her blood sugar levels and obtaining a glucometer. Diabetes is well controlled at this time. She takes glipizide  and metformin  as prescribed.    Review of Systems  Constitutional:  Negative for fever, malaise/fatigue and weight loss.  HENT: Negative.  Negative for nosebleeds.   Eyes: Negative.  Negative for  blurred vision, double vision and photophobia.  Respiratory: Negative.  Negative for cough and shortness of breath.   Cardiovascular: Negative.  Negative for chest pain, palpitations and leg swelling.  Gastrointestinal: Negative.  Negative for heartburn, nausea and vomiting.  Genitourinary:  Positive for frequency.       SEE HPI  Musculoskeletal: Negative.  Negative for myalgias.  Neurological: Negative.  Negative for dizziness, focal weakness, seizures and headaches.  Psychiatric/Behavioral: Negative.  Negative for suicidal ideas.     Past Medical History:  Diagnosis Date   Allergy    Anxiety    Asthma    Bipolar disorder (HCC)    Diabetes mellitus    Diabetes mellitus, type II (HCC)    Gallstone    GERD (gastroesophageal reflux disease)    History of borderline personality disorder    Hypercholesteremia    Hypertension    Obesity    Psoriasis    Psoriasis    Seasonal allergies    Thyroid  disease     Past Surgical History:  Procedure Laterality Date   CHOLECYSTECTOMY N/A 10/02/2021   Procedure: LAPAROSCOPIC CHOLECYSTECTOMY;  Surgeon: Evonnie Dorothyann LABOR, DO;  Location: [facility];  Service: General;  Laterality: N/A;   TONSILLECTOMY     TUBAL LIGATION  2 years ago    Family History  Problem Relation Age of Onset   Personality disorder Mother    Diabetes Mother    Diabetes Father    Alcohol  abuse  Maternal Uncle    Depression Maternal Uncle    Diabetes Maternal Grandfather    Breast cancer Neg Hx    Stomach cancer Neg Hx    Colon cancer Neg Hx    Esophageal cancer Neg Hx    Colon polyps Neg Hx    Crohn's disease Neg Hx    Ulcerative colitis Neg Hx    Rectal cancer Neg Hx     Social History Reviewed with no changes to be made today.   Outpatient Medications Prior to Visit  Medication Sig Dispense Refill   albuterol  (VENTOLIN  HFA) 108 (90 Base) MCG/ACT inhaler Inhale 2 puffs into the lungs every 6 (six) hours as needed for wheezing or shortness of breath. 6.7  g 2   azelastine  (ASTELIN ) 0.1 % nasal spray Place 2 sprays into both nostrils 2 (two) times daily. Use in each nostril as directed (Patient taking differently: Place 2 sprays into both nostrils 2 (two) times daily.) 30 mL 12   benazepril  (LOTENSIN ) 20 MG tablet Take 1 tablet (20 mg total) by mouth daily. 90 tablet 1   busPIRone  (BUSPAR ) 30 MG tablet Take 1 tablet (30 mg total) by mouth 2 (two) times daily. 180 tablet 1   cetirizine (ZYRTEC) 10 MG tablet Take 10 mg by mouth in the morning.     cholecalciferol (VITAMIN D ) 1000 UNITS tablet Take 1,000 Units by mouth daily.     clobetasol  (TEMOVATE ) 0.05 % external solution Apply 1 (one) mL two times daily to SCALP. after 14 days reduce use to 2 days per week. (Patient taking differently: 1 Application See admin instructions. Apply to scalp 2 times a week) 50 mL 0   clobetasol  cream (TEMOVATE ) 0.05 % Apply 1 (one) Application two times a day, 2 days per week 60 g 0   doxycycline  (VIBRA -TABS) 100 MG tablet Take 1 tablet (100 mg total) by mouth daily. 30 tablet 1   estradiol  (ESTRACE  VAGINAL) 0.1 MG/GM vaginal cream Place 1 gram vaginally at bedtime. Every night for 2 weeks and then twice a week aftewards (Patient taking differently: Place 1 Applicatorful vaginally 2 (two) times a week.) 42.5 g 12   famotidine  (PEPCID ) 20 MG tablet Take 1 tablet (20 mg total) by mouth 2 (two) times daily. 180 tablet 1   fluocinonide  (LIDEX ) 0.05 % external solution Apply 1 Application topically 2 (two) times daily as needed 120 mL 0   fluticasone  (FLONASE ) 50 MCG/ACT nasal spray Place 2 sprays into both nostrils daily. 16 g 2   gabapentin  (NEURONTIN ) 100 MG capsule Take 1 capsule (100 mg total) by mouth 2 (two) times daily. 180 capsule 1   glipiZIDE  (GLUCOTROL  XL) 10 MG 24 hr tablet Take 1 tablet (10 mg total) by mouth daily with breakfast. 90 tablet 1   hydroxypropyl methylcellulose / hypromellose (ISOPTO TEARS / GONIOVISC) 2.5 % ophthalmic solution Place 1 drop into both  eyes in the morning and at bedtime. 15 mL 3   hydrOXYzine  (ATARAX ) 25 MG tablet Take 0.5-1 tablets (12.5-25 mg total) by mouth 2 (two) times daily as needed for anxiety. (Patient not taking: Reported on 01/26/2024) 90 tablet 1   levothyroxine  (SYNTHROID ) 50 MCG tablet Take 50 mcg by mouth daily before breakfast.     levothyroxine  (SYNTHROID ) 50 MCG tablet Take 1 tablet (50 mcg total) by mouth daily before breakfast. 90 tablet 1   lidocaine  (LIDODERM ) 5 % Place 1 patch onto the skin daily. Remove & Discard patch within 12 hours daily, to lower back  30 patch 0   lithium  carbonate 300 MG capsule Take 1 capsule (300 mg total) by mouth 2 (two) times daily with a meal. 60 capsule 2   lurasidone  (LATUDA ) 40 MG TABS tablet Take 1 tablet (40 mg total) by mouth daily with supper. Should be taken with food (at least 350 calories). 90 tablet 1   metFORMIN  (GLUCOPHAGE ) 1000 MG tablet Take 1,000 mg by mouth 2 (two) times daily with a meal.     methotrexate  (RHEUMATREX) 2.5 MG tablet Take 1 tablet (2.5 mg total) by mouth every 12 (twelve) hours x3 doses per week. (3 tablets weekly). 12 tablet 0   Multiple Vitamin (MULTIVITAMIN) capsule Take 1 capsule by mouth daily with breakfast.     neomycin -polymyxin b -dexamethasone  (MAXITROL ) 3.5-10000-0.1 OINT Apply 1/4 inch into left eye every night at bedtime 3.5 g 1   Nicotine  (NICODERM CQ  TD) Place 1 patch onto the skin daily as needed (for smoking cessation while hospitalized).     nystatin  (MYCOSTATIN ) 100000 UNIT/ML suspension Take 5 mLs (500,000 Units total) by mouth 4 (four) times daily. 60 mL 0   simvastatin  (ZOCOR ) 40 MG tablet Take 1 tablet (40 mg total) by mouth daily. 90 tablet 1   traZODone  (DESYREL ) 150 MG tablet Take 0.5-1 tablets (75-150 mg total) by mouth at bedtime. 90 tablet 1   No facility-administered medications prior to visit.    Allergies[1]     Objective:    BP 137/79 (BP Location: Left Arm, Patient Position: Sitting, Cuff Size: Normal)    Pulse 77   Ht 5' 6.5 (1.689 m)   Wt 230 lb 6.4 oz (104.5 kg)   LMP 04/08/2022   SpO2 99%   BMI 36.63 kg/m  Wt Readings from Last 3 Encounters:  02/20/24 230 lb 6.4 oz (104.5 kg)  01/17/24 207 lb (93.9 kg)  01/16/24 229 lb 6.4 oz (104.1 kg)    Physical Exam Vitals and nursing note reviewed.  Constitutional:      Appearance: She is well-developed.  HENT:     Head: Normocephalic and atraumatic.  Cardiovascular:     Rate and Rhythm: Normal rate and regular rhythm.     Heart sounds: Normal heart sounds. No murmur heard.    No friction rub. No gallop.  Pulmonary:     Effort: Pulmonary effort is normal. No tachypnea or respiratory distress.     Breath sounds: Normal breath sounds. No decreased breath sounds, wheezing, rhonchi or rales.  Chest:     Chest wall: No tenderness.  Abdominal:     General: Bowel sounds are normal.     Palpations: Abdomen is soft.  Genitourinary:    Comments: SELF SWAB Musculoskeletal:        General: Normal range of motion.     Cervical back: Normal range of motion.  Skin:    General: Skin is warm and dry.  Neurological:     Mental Status: She is alert and oriented to person, place, and time.     Coordination: Coordination normal.  Psychiatric:        Behavior: Behavior normal. Behavior is cooperative.        Thought Content: Thought content normal.        Judgment: Judgment normal.          Patient has been counseled extensively about nutrition and exercise as well as the importance of adherence with medications and regular follow-up. The patient was given clear instructions to go to ER or return to medical center if  symptoms don't improve, worsen or new problems develop. The patient verbalized understanding.   Follow-up: Return for establishing with new provider in January .   Haze LELON Servant, FNP-BC [facility] [place], [place] [phone]   02/20/2024, 3:47 PM     [1] No Known Allergies

## 2024-02-21 LAB — CERVICOVAGINAL ANCILLARY ONLY
Bacterial Vaginitis (gardnerella): POSITIVE — AB
Candida Glabrata: NEGATIVE
Candida Vaginitis: NEGATIVE
Chlamydia: NEGATIVE
Comment: NEGATIVE
Comment: NEGATIVE
Comment: NEGATIVE
Comment: NEGATIVE
Comment: NEGATIVE
Comment: NORMAL
Neisseria Gonorrhea: NEGATIVE
Trichomonas: NEGATIVE

## 2024-02-21 LAB — URINALYSIS, COMPLETE
Bilirubin, UA: NEGATIVE
Glucose, UA: NEGATIVE
Ketones, UA: NEGATIVE
Leukocytes,UA: NEGATIVE
Nitrite, UA: NEGATIVE
Protein,UA: NEGATIVE
RBC, UA: NEGATIVE
Specific Gravity, UA: 1.009 (ref 1.005–1.030)
Urobilinogen, Ur: 0.2 mg/dL (ref 0.2–1.0)
pH, UA: 7 (ref 5.0–7.5)

## 2024-02-21 LAB — MICROSCOPIC EXAMINATION
Bacteria, UA: NONE SEEN
Casts: NONE SEEN /LPF
RBC, Urine: NONE SEEN /HPF (ref 0–2)
WBC, UA: NONE SEEN /HPF (ref 0–5)

## 2024-02-21 LAB — MICROALBUMIN / CREATININE URINE RATIO
Creatinine, Urine: 38.8 mg/dL
Microalb/Creat Ratio: 14 mg/g{creat} (ref 0–29)
Microalbumin, Urine: 5.6 ug/mL

## 2024-02-22 LAB — URINE CULTURE

## 2024-02-23 ENCOUNTER — Other Ambulatory Visit: Payer: Self-pay

## 2024-02-25 ENCOUNTER — Ambulatory Visit: Payer: Self-pay | Admitting: Nurse Practitioner

## 2024-02-25 DIAGNOSIS — B9689 Other specified bacterial agents as the cause of diseases classified elsewhere: Secondary | ICD-10-CM

## 2024-02-25 MED ORDER — METRONIDAZOLE 500 MG PO TABS
500.0000 mg | ORAL_TABLET | Freq: Two times a day (BID) | ORAL | 0 refills | Status: AC
  Filled 2024-02-25: qty 14, 7d supply, fill #0

## 2024-02-27 ENCOUNTER — Other Ambulatory Visit: Payer: Self-pay

## 2024-02-27 ENCOUNTER — Other Ambulatory Visit (INDEPENDENT_AMBULATORY_CARE_PROVIDER_SITE_OTHER)

## 2024-02-27 DIAGNOSIS — Z79899 Other long term (current) drug therapy: Secondary | ICD-10-CM | POA: Diagnosis not present

## 2024-02-27 NOTE — Progress Notes (Signed)
 Patient presented to the office for labs, labs were drawn from [facility] with no issue or complaints . Pt left office alert and ambulatory.

## 2024-02-28 LAB — LITHIUM LEVEL: Lithium Lvl: 0.9 mmol/L (ref 0.5–1.2)

## 2024-03-05 ENCOUNTER — Encounter: Payer: Self-pay | Admitting: Nurse Practitioner

## 2024-03-05 ENCOUNTER — Ambulatory Visit (INDEPENDENT_AMBULATORY_CARE_PROVIDER_SITE_OTHER): Payer: Self-pay | Admitting: Nurse Practitioner

## 2024-03-05 VITALS — BP 119/74 | HR 76 | Wt 235.0 lb

## 2024-03-05 DIAGNOSIS — L409 Psoriasis, unspecified: Secondary | ICD-10-CM

## 2024-03-05 DIAGNOSIS — M79601 Pain in right arm: Secondary | ICD-10-CM | POA: Insufficient documentation

## 2024-03-05 NOTE — Assessment & Plan Note (Signed)
" °  Worsening psoriasis despite two months on methotrexate . Previously on Tremfya . Using clobetasol  and fluocinonide  for scalp. - Continue follow-up with dermatologist for potential medication adjustments "

## 2024-03-05 NOTE — Assessment & Plan Note (Addendum)
 Right arm pain, no pain on tenderness on range of motion of right shoulder and elbow, no pain on palpation of right upper arm  likely muscle strain or degenerative changes.  Could also be a potential side effect of methotrexate  since she started taking methotrexate  2 months ago and pain started 4 weeks ago no sign of infection or DVT noted. Robaxin  provided temporary relief. - Recommended Tylenol  500 mg every 6 hours as needed, max 3000 mg/24 hours. - Advised follow-up with orthopedic specialist if pain persists after 2-3 weeks. Follow-up with dermatologist as this can side effect of methotrexate   .

## 2024-03-05 NOTE — Patient Instructions (Signed)
 Please take Tylenol  500 mg every 6 hours as needed for pain.  Do not take more than 3000 mg a day Please follow-up with your rheumatologist as discussed  It is important that you exercise regularly at least 30 minutes 5 times a week as tolerated  Think about what you will eat, plan ahead. Choose  clean, green, fresh or frozen over canned, processed or packaged foods which are more sugary, salty and fatty. 70 to 75% of food eaten should be vegetables and fruit. Three meals at set times with snacks allowed between meals, but they must be fruit or vegetables. Aim to eat over a 12 hour period , example 7 am to 7 pm, and STOP after  your last meal of the day. Drink water,generally about 64 ounces per day, no other drink is as healthy. Fruit juice is best enjoyed in a healthy way, by EATING the fruit.  Thanks for choosing [facility] we consider it a privelige to serve you.

## 2024-03-05 NOTE — Progress Notes (Signed)
 "  Acute Office Visit  Subjective:     Patient ID: Valerie Reilly, female    DOB: 09-29-1975, 48 y.o.   MRN: [id number]  Chief Complaint  Patient presents with   Arm Pain    Right upper arm started a month ago    Arm Pain  Pertinent negatives include no chest pain or numbness.     Discussed the use of AI scribe software for clinical note transcription with the patient, who gave verbal consent to proceed.  History of Present Illness Valerie Reilly is a 48 year old female  has a past medical history of Allergy, Anxiety, Asthma, Bipolar disorder (HCC), Diabetes mellitus, Diabetes mellitus, type II (HCC), Gallstone, GERD (gastroesophageal reflux disease), History of borderline personality disorder, Hypercholesteremia, Hypertension, Obesity, Psoriasis, Psoriasis, Seasonal allergies, and Thyroid  disease.  who presents with right arm pain.  She has been experiencing pain in her right arm for approximately one month. The pain originates from the elbow and extends towards the shoulder, described as bothersome and sometimes throbbing. It occurs during activities such as taking off a jacket, washing in the shower, and applying deodorant. No recent trauma or injury to the area.  She has been using Robaxin  for pain relief, which provides some relief but is not long-lasting. She inquires about the use of Tylenol  for additional pain management.  She has a history of a pinched nerve in her back, previously treated by an orthopedic specialist. She has not experienced similar arm pain before.  She has psoriasis, which has been worsening. She is currently using clobetasol  and a topical solution for her scalp. She was previously on Tremfya  and is now on methotrexate  for the past two months, which she reports is not helping her psoriasis. She is not currently on any medication for psoriatic arthritis.    Assessment & Plan     Review of Systems  Constitutional:  Negative for appetite change,  chills, fatigue and fever.  HENT:  Negative for congestion, postnasal drip, rhinorrhea and sneezing.   Respiratory:  Negative for cough, shortness of breath and wheezing.   Cardiovascular:  Negative for chest pain, palpitations and leg swelling.  Gastrointestinal:  Negative for abdominal pain, constipation, nausea and vomiting.  Genitourinary:  Negative for difficulty urinating, dysuria, flank pain and frequency.  Musculoskeletal:  Positive for arthralgias. Negative for joint swelling and myalgias.  Skin:  Positive for rash. Negative for pallor and wound.  Neurological:  Negative for dizziness, facial asymmetry, weakness, numbness and headaches.  Psychiatric/Behavioral:  Negative for behavioral problems, confusion, self-injury and suicidal ideas.         Objective:    BP 119/74   Pulse 76   Wt 235 lb (106.6 kg)   LMP 04/08/2022   SpO2 100%   BMI 37.36 kg/m    Physical Exam Vitals and nursing note reviewed.  Constitutional:      General: She is not in acute distress.    Appearance: Normal appearance. She is obese. She is not ill-appearing, toxic-appearing or diaphoretic.  HENT:     Mouth/Throat:     Mouth: Mucous membranes are moist.     Pharynx: Oropharynx is clear. No oropharyngeal exudate or posterior oropharyngeal erythema.  Eyes:     General: No scleral icterus.       Right eye: No discharge.        Left eye: No discharge.     Extraocular Movements: Extraocular movements intact.     Conjunctiva/sclera: Conjunctivae normal.  Cardiovascular:  Rate and Rhythm: Normal rate and regular rhythm.     Pulses: Normal pulses.     Heart sounds: Normal heart sounds. No murmur heard.    No friction rub. No gallop.  Pulmonary:     Effort: Pulmonary effort is normal. No respiratory distress.     Breath sounds: Normal breath sounds. No stridor. No wheezing, rhonchi or rales.  Chest:     Chest wall: No tenderness.  Abdominal:     General: There is no distension.      Palpations: Abdomen is soft.     Tenderness: There is no abdominal tenderness. There is no right CVA tenderness, left CVA tenderness or guarding.  Musculoskeletal:        General: No swelling, tenderness, deformity or signs of injury.     Right lower leg: No edema.     Left lower leg: No edema.     Comments:  no pain on tenderness on range of motion of right shoulder and elbow, no pain on palpation of right upper arm.  Skin is warm and dry no redness or swelling noted  Skin:    General: Skin is warm and dry.     Capillary Refill: Capillary refill takes less than 2 seconds.     Coloration: Skin is not jaundiced or pale.     Findings: Rash present. No bruising or erythema.     Comments: Rashes consistent with psoriasis noted on both arms  Neurological:     Mental Status: She is alert and oriented to person, place, and time.     Motor: No weakness.     Gait: Gait normal.  Psychiatric:        Mood and Affect: Mood normal.        Behavior: Behavior normal.        Thought Content: Thought content normal.        Judgment: Judgment normal.     No results found for any visits on 03/05/24.      Assessment & Plan:   Problem List Items Addressed This Visit       Musculoskeletal and Integument   Psoriasis    Worsening psoriasis despite two months on methotrexate . Previously on Tremfya . Using clobetasol  and fluocinonide  for scalp. - Continue follow-up with dermatologist for potential medication adjustments        Other   Pain of right upper extremity - Primary   Right arm pain, no pain on tenderness on range of motion of right shoulder and elbow, no pain on palpation of right upper arm  likely muscle strain or degenerative changes.  Could also be a potential side effect of methotrexate  since she started taking methotrexate  2 months ago and pain started 4 weeks ago no sign of infection or DVT noted. Robaxin  provided temporary relief. - Recommended Tylenol  500 mg every 6 hours as  needed, max 3000 mg/24 hours. - Advised follow-up with orthopedic specialist if pain persists after 2-3 weeks. Follow-up with dermatologist as this can side effect of methotrexate   .       No orders of the defined types were placed in this encounter.   No follow-ups on file.  Allanna Bresee R Karington Zarazua, FNP  "

## 2024-03-06 ENCOUNTER — Other Ambulatory Visit: Payer: Self-pay

## 2024-03-06 ENCOUNTER — Ambulatory Visit (HOSPITAL_COMMUNITY): Admitting: Physician Assistant

## 2024-03-06 DIAGNOSIS — F411 Generalized anxiety disorder: Secondary | ICD-10-CM | POA: Diagnosis not present

## 2024-03-06 DIAGNOSIS — F319 Bipolar disorder, unspecified: Secondary | ICD-10-CM

## 2024-03-06 DIAGNOSIS — F99 Mental disorder, not otherwise specified: Secondary | ICD-10-CM

## 2024-03-06 DIAGNOSIS — F5105 Insomnia due to other mental disorder: Secondary | ICD-10-CM

## 2024-03-06 MED ORDER — TRAZODONE HCL 150 MG PO TABS
75.0000 mg | ORAL_TABLET | Freq: Every day | ORAL | 1 refills | Status: AC
  Filled 2024-03-06: qty 90, 90d supply, fill #0

## 2024-03-06 MED ORDER — LITHIUM CARBONATE 300 MG PO CAPS
300.0000 mg | ORAL_CAPSULE | Freq: Two times a day (BID) | ORAL | 1 refills | Status: AC
  Filled 2024-03-06: qty 180, 90d supply, fill #0

## 2024-03-06 MED ORDER — BUSPIRONE HCL 30 MG PO TABS
30.0000 mg | ORAL_TABLET | Freq: Two times a day (BID) | ORAL | 1 refills | Status: AC
  Filled 2024-03-06: qty 60, 30d supply, fill #0
  Filled 2024-03-31: qty 60, 30d supply, fill #1
  Filled 2024-04-03: qty 60, 30d supply, fill #0

## 2024-03-06 MED ORDER — HYDROXYZINE HCL 25 MG PO TABS
12.5000 mg | ORAL_TABLET | Freq: Two times a day (BID) | ORAL | 1 refills | Status: AC | PRN
  Filled 2024-03-06: qty 60, 30d supply, fill #0

## 2024-03-06 MED ORDER — LURASIDONE HCL 40 MG PO TABS
40.0000 mg | ORAL_TABLET | Freq: Every day | ORAL | 1 refills | Status: AC
  Filled 2024-03-06: qty 30, 30d supply, fill #0
  Filled 2024-03-31: qty 30, 30d supply, fill #1
  Filled 2024-04-03: qty 30, 30d supply, fill #0

## 2024-03-06 MED ORDER — GABAPENTIN 100 MG PO CAPS
100.0000 mg | ORAL_CAPSULE | Freq: Two times a day (BID) | ORAL | 1 refills | Status: AC
  Filled 2024-03-06: qty 180, 90d supply, fill #0

## 2024-03-06 NOTE — Progress Notes (Signed)
 [facility] MD/PA/NP OP Progress Note  03/06/2024 7:46 PM Valerie Reilly  MRN:  [id number]  Chief Complaint:  Chief Complaint  Patient presents with   Follow-up   Medication Refill   HPI:   Valerie Reilly is a 48 year old female with a past psychiatric history significant for generalized anxiety disorder, bipolar 1 disorder, and insomnia (due to other mental disorder) who presents to [facility] for follow-up and medication management.  Patient was last seen by Dr. Mercy on 01/26/2024.  During her last encounter, patient was being managed on the following psychiatric medications:  Trazodone  75 to 150 mg at bedtime as needed Lithium  300 mg 2 times daily Latuda  40 mg in the evening with supper Buspirone  30 mg 2 times daily Gabapentin  100 mg 2 times daily Hydroxyzine  12.5 to 25 mg 2 times daily as needed  Patient presents to the encounter stating that she has not been experiencing any issues with her medications and reports that she takes her medications regularly.  Patient denies overt depressive symptoms but does continue to endorse anxiety she rates a 5 out of 10.  Patient's biggest trigger is that her daughter is currently living with her.  Patient denies recent episodes of mania and states that her last manic episode occurred roughly a month ago.  Patient denies any new stressors at this time.  A PHQ-9 screen was performed with the patient scoring an 8.  A GAD-7 screen was also performed with the patient scoring a 13.  Patient is alert and oriented x 4, calm, cooperative, and fully engaged in conversation during the encounter.  Patient describes her mood as agitated due to having to show up in person for her encounter.  Patient exhibits euthymic mood with appropriate affect.  Patient denies suicidal or homicidal ideations.  She further denies auditory or visual hallucinations and does not appear to be responding to internal/external stimuli.  Patient  endorses good sleep and receives on average 6 to 8 hours of sleep per night.  Patient endorses fair appetite and eats on average 2 meals per day.  Patient endorses alcohol  consumption sparingly and states that she last drank 6 months ago.  Patient endorses tobacco use and smokes on average 2 to 3 packs of cigarettes per day.  Patient denies illicit drug use.  Visit Diagnosis:    ICD-10-CM   1. GAD (generalized anxiety disorder)  F41.1 busPIRone  (BUSPAR ) 30 MG tablet    gabapentin  (NEURONTIN ) 100 MG capsule    hydrOXYzine  (ATARAX ) 25 MG tablet    2. Bipolar I disorder (HCC)  F31.9 lithium  carbonate 300 MG capsule    traZODone  (DESYREL ) 150 MG tablet    lurasidone  (LATUDA ) 40 MG TABS tablet    3. Insomnia due to other mental disorder  F51.05 traZODone  (DESYREL ) 150 MG tablet   F99       Past Psychiatric History:  Diagnoses: Bipolar 1 disorder, borderline personality disorder, cannabis use disorder in remission Medication trials: Effexor, Depakote (brief) Hospitalizations: multiple - last hospitalization at [facility] in 2014 for episode of mania Suicide attempt: yes - many years ago; hx of 2-3 previous attempts  Access to guns: denies Substance use:              -- Etoh: denies             -- Denies use of cannabis or other illicit drugs             -- Tobacco: 2 ppd; precontemplative  regarding cessation  Past Medical History:  Past Medical History:  Diagnosis Date   Allergy    Anxiety    Asthma    Bipolar disorder (HCC)    Diabetes mellitus    Diabetes mellitus, type II (HCC)    Gallstone    GERD (gastroesophageal reflux disease)    History of borderline personality disorder    Hypercholesteremia    Hypertension    Obesity    Psoriasis    Psoriasis    Seasonal allergies    Thyroid  disease     Past Surgical History:  Procedure Laterality Date   CHOLECYSTECTOMY N/A 10/02/2021   Procedure: LAPAROSCOPIC CHOLECYSTECTOMY;  Surgeon: Evonnie Dorothyann LABOR, DO;  Location: [facility];  Service: General;  Laterality: N/A;   TONSILLECTOMY     TUBAL LIGATION  2 years ago    Family Psychiatric History:  Maternal Uncle - depression, alcohol  use Mother - personality disorder  Family History:  Family History  Problem Relation Age of Onset   Personality disorder Mother    Diabetes Mother    Diabetes Father    Alcohol  abuse Maternal Uncle    Depression Maternal Uncle    Diabetes Maternal Grandfather    Breast cancer Neg Hx    Stomach cancer Neg Hx    Colon cancer Neg Hx    Esophageal cancer Neg Hx    Colon polyps Neg Hx    Crohn's disease Neg Hx    Ulcerative colitis Neg Hx    Rectal cancer Neg Hx     Social History:  Social History   Socioeconomic History   Marital status: Divorced    Spouse name: Not on file   Number of children: 1   Years of education: Not on file   Highest education level: Associate degree: occupational, scientist, product/process development, or vocational program  Occupational History   Occupation: delivery newspaper  Tobacco Use   Smoking status: Every Day    Current packs/day: 2.00    Average packs/day: 2.0 packs/day for 30.0 years (60.0 ttl pk-yrs)    Types: Cigarettes    Passive exposure: Current   Smokeless tobacco: Never  Vaping Use   Vaping status: Never Used  Substance and Sexual Activity   Alcohol  use: Yes    Comment: occasional- a few times a year   Drug use: No    Types: Marijuana    Comment: last time was 3 yrs ago   Sexual activity: Yes    Birth control/protection: Surgical    Comment: Tubal ligation  Other Topics Concern   Not on file  Social History Narrative   Not on file   Social Drivers of Health   Tobacco Use: High Risk (03/05/2024)   Patient History    Smoking Tobacco Use: Every Day    Smokeless Tobacco Use: Never    Passive Exposure: Current  Financial Resource Strain: High Risk (12/01/2023)   Overall Financial Resource Strain (CARDIA)    Difficulty of Paying Living Expenses: Very hard  Food Insecurity: No Food  Insecurity (01/17/2024)   Epic    Worried About Programme Researcher, Broadcasting/film/video in the Last Year: Never true    Ran Out of Food in the Last Year: Never true  Recent Concern: Food Insecurity - Food Insecurity Present (12/16/2023)   Epic    Worried About Programme Researcher, Broadcasting/film/video in the Last Year: Sometimes true    Ran Out of Food in the Last Year: Sometimes true  Transportation Needs: No Transportation Needs (01/17/2024)  Epic    Lack of Transportation (Medical): No    Lack of Transportation (Non-Medical): No  Physical Activity: Inactive (12/15/2022)   Exercise Vital Sign    Days of Exercise per Week: 0 days    Minutes of Exercise per Session: 0 min  Stress: No Stress Concern Present (12/15/2022)   [employer] of Occupational Health - Occupational Stress Questionnaire    Feeling of Stress : Not at all  Social Connections: Socially Isolated (12/15/2022)   Social Connection and Isolation Panel    Frequency of Communication with Friends and Family: More than three times a week    Frequency of Social Gatherings with Friends and Family: Once a week    Attends Religious Services: Never    Database Administrator or Organizations: No    Attends Banker Meetings: Never    Marital Status: Divorced  Depression (PHQ2-9): Medium Risk (03/06/2024)   Depression (PHQ2-9)    PHQ-2 Score: 8  Alcohol  Screen: Low Risk (06/06/2022)   Alcohol  Screen    Last Alcohol  Screening Score (AUDIT): 5  Housing: Low Risk (01/17/2024)   Epic    Unable to Pay for Housing in the Last Year: No    Number of Times Moved in the Last Year: 0    Homeless in the Last Year: No  Utilities: Not At Risk (01/17/2024)   Epic    Threatened with loss of utilities: No  Recent Concern: Utilities - At Risk (12/01/2023)   Epic    Threatened with loss of utilities: Yes  Health Literacy: Adequate Health Literacy (12/15/2022)   B1300 Health Literacy    Frequency of need for help with medical instructions: Never    Allergies:  Allergies[1]  Metabolic Disorder Labs: Lab Results  Component Value Date   HGBA1C 6.4 (H) 01/18/2024   MPG 136.98 01/18/2024   No results found for: PROLACTIN Lab Results  Component Value Date   CHOL 135 12/15/2022   TRIG 109 12/15/2022   HDL 41 12/15/2022   CHOLHDL 3.3 12/15/2022   LDLCALC 74 12/15/2022   LDLCALC 54 06/10/2022   Lab Results  Component Value Date   TSH 5.920 (H) 01/19/2024   TSH 1.630 04/13/2023    Therapeutic Level Labs: Lab Results  Component Value Date   LITHIUM  0.9 02/27/2024   LITHIUM  0.57 (L) 01/20/2024   No results found for: VALPROATE No results found for: CBMZ  Current Medications: Current Outpatient Medications  Medication Sig Dispense Refill   Accu-Chek Softclix Lancets lancets Use as instructed. Testing twice daily 100 each 3   albuterol  (VENTOLIN  HFA) 108 (90 Base) MCG/ACT inhaler Inhale 2 puffs into the lungs every 6 (six) hours as needed for wheezing or shortness of breath. 6.7 g 2   azelastine  (ASTELIN ) 0.1 % nasal spray Place 2 sprays into both nostrils 2 (two) times daily. Use in each nostril as directed (Patient not taking: Reported on 03/05/2024) 30 mL 12   benazepril  (LOTENSIN ) 20 MG tablet Take 1 tablet (20 mg total) by mouth daily. 90 tablet 1   busPIRone  (BUSPAR ) 30 MG tablet Take 1 tablet (30 mg total) by mouth 2 (two) times daily. 180 tablet 1   cetirizine (ZYRTEC) 10 MG tablet Take 10 mg by mouth in the morning.     cholecalciferol (VITAMIN D ) 1000 UNITS tablet Take 1,000 Units by mouth daily.     clobetasol  (TEMOVATE ) 0.05 % external solution Apply 1 (one) mL two times daily to SCALP. after 14 days reduce use to  2 days per week. 50 mL 0   clobetasol  cream (TEMOVATE ) 0.05 % Apply 1 (one) Application two times a day, 2 days per week 60 g 0   doxycycline  (VIBRA -TABS) 100 MG tablet Take 1 tablet (100 mg total) by mouth daily. (Patient not taking: Reported on 03/05/2024) 30 tablet 1   estradiol  (ESTRACE  VAGINAL) 0.1 MG/GM  vaginal cream Place 1 gram vaginally at bedtime. Every night for 2 weeks and then twice a week aftewards 42.5 g 12   famotidine  (PEPCID ) 20 MG tablet Take 1 tablet (20 mg total) by mouth 2 (two) times daily. 180 tablet 1   fluocinonide  (LIDEX ) 0.05 % external solution Apply 1 Application topically 2 (two) times daily as needed 120 mL 0   fluticasone  (FLONASE ) 50 MCG/ACT nasal spray Place 2 sprays into both nostrils daily. 16 g 2   gabapentin  (NEURONTIN ) 100 MG capsule Take 1 capsule (100 mg total) by mouth 2 (two) times daily. 180 capsule 1   glipiZIDE  (GLUCOTROL  XL) 10 MG 24 hr tablet Take 1 tablet (10 mg total) by mouth daily with breakfast. 90 tablet 1   glucose blood (TRUE METRIX BLOOD GLUCOSE TEST) test strip Use as instructed 100 each 12   hydroxypropyl methylcellulose / hypromellose (ISOPTO TEARS / GONIOVISC) 2.5 % ophthalmic solution Place 1 drop into both eyes in the morning and at bedtime. 15 mL 3   hydrOXYzine  (ATARAX ) 25 MG tablet Take 0.5-1 tablets (12.5-25 mg total) by mouth 2 (two) times daily as needed for anxiety. 90 tablet 1   levothyroxine  (SYNTHROID ) 50 MCG tablet Take 50 mcg by mouth daily before breakfast.     levothyroxine  (SYNTHROID ) 50 MCG tablet Take 1 tablet (50 mcg total) by mouth daily before breakfast. 90 tablet 1   lidocaine  (LIDODERM ) 5 % Place 1 patch onto the skin daily. Remove & Discard patch within 12 hours daily, to lower back (Patient not taking: Reported on 03/05/2024) 30 patch 0   lithium  carbonate 300 MG capsule Take 1 capsule (300 mg total) by mouth 2 (two) times daily with a meal. 180 capsule 1   lurasidone  (LATUDA ) 40 MG TABS tablet Take 1 tablet (40 mg total) by mouth daily with supper. Should be taken with food (at least 350 calories). 90 tablet 1   metFORMIN  (GLUCOPHAGE ) 1000 MG tablet Take 1,000 mg by mouth 2 (two) times daily with a meal.     methotrexate  (RHEUMATREX) 2.5 MG tablet Take 1 tablet (2.5 mg total) by mouth every 12 (twelve) hours x3 doses  per week. (3 tablets weekly). 12 tablet 0   Multiple Vitamin (MULTIVITAMIN) capsule Take 1 capsule by mouth daily with breakfast.     neomycin -polymyxin b -dexamethasone  (MAXITROL ) 3.5-10000-0.1 OINT Apply 1/4 inch into left eye every night at bedtime (Patient not taking: Reported on 03/05/2024) 3.5 g 1   Nicotine  (NICODERM CQ  TD) Place 1 patch onto the skin daily as needed (for smoking cessation while hospitalized). (Patient not taking: Reported on 03/05/2024)     nystatin  (MYCOSTATIN ) 100000 UNIT/ML suspension Take 5 mLs (500,000 Units total) by mouth 4 (four) times daily. (Patient not taking: Reported on 03/05/2024) 60 mL 0   simvastatin  (ZOCOR ) 40 MG tablet Take 1 tablet (40 mg total) by mouth daily. 90 tablet 1   traZODone  (DESYREL ) 150 MG tablet Take 0.5-1 tablets (75-150 mg total) by mouth at bedtime. 90 tablet 1   No current facility-administered medications for this visit.     Musculoskeletal: Strength & Muscle Tone: within normal limits Gait &  Station: normal Patient leans: N/A  Psychiatric Specialty Exam: Review of Systems  Psychiatric/Behavioral:  Negative for decreased concentration, dysphoric mood, hallucinations, self-injury, sleep disturbance and suicidal ideas. The patient is nervous/anxious. The patient is not hyperactive.     Blood pressure (!) 142/86, pulse 88, temperature 98.2 F (36.8 C), temperature source Oral, height 5' 6.5 (1.689 m), weight 236 lb 3.2 oz (107.1 kg), last menstrual period 04/08/2022, SpO2 100%.Body mass index is 37.55 kg/m.  General Appearance: Casual  Eye Contact:  Good  Speech:  Clear and Coherent and Normal Rate  Volume:  Normal  Mood:  Anxious  Affect:  Appropriate  Thought Process:  Coherent, Goal Directed, and Descriptions of Associations: Intact  Orientation:  Full (Time, Place, and Person)  Thought Content: WDL   Suicidal Thoughts:  No  Homicidal Thoughts:  No  Memory:  Immediate;   Good Recent;   Good Remote;   Fair  Judgement:   Good  Insight:  Fair  Psychomotor Activity:  Normal  Concentration:  Concentration: Good and Attention Span: Good  Recall:  Good  Fund of Knowledge: Good  Language: Good  Akathisia:  No  Handed: Not assessed  AIMS (if indicated): done; 0  Assets:  Communication Skills Desire for Improvement Housing Intimacy Leisure Time Social Support Vocational/Educational  ADL's:  Intact  Cognition: Impaired,  Mild MoCA performed on 11/01/2023 19/30  Sleep:  Good   Screenings: AIMS    Flowsheet Row Clinical Support from 03/06/2024 in [facility]  AIMS Total Score 0   AUDIT    Flowsheet Row Office Visit from 06/10/2022 in [facility] [place] - A Dept Of [place]. [facility]  Alcohol  Use Disorder Identification Test Final Score (AUDIT) 5   GAD-7    Flowsheet Row Clinical Support from 03/06/2024 in [facility] Office Visit from 03/05/2024 in [facility] Dept Of [facility] Office Visit from 01/10/2024 in [facility] [place] - A Dept Of [place]. [facility] Patient Outreach Telephone from 12/16/2023 in [facility] HEALTH POPULATION HEALTH DEPARTMENT Office Visit from 12/14/2023 in [facility]  Total GAD-7 Score 13 0 0 0 0   PHQ2-9    Flowsheet Row Clinical Support from 03/06/2024 in [facility] Office Visit from 03/05/2024 in [facility] - A Dept Of [facility] Office Visit from 01/10/2024 in [facility] [place] - A Dept Of [place]. [facility] Patient Outreach Telephone from 12/16/2023 in [place] POPULATION HEALTH DEPARTMENT Office Visit from 12/14/2023 in [facility] at [facility]  PHQ-2 Total Score 2 0 0 0 0  PHQ-9 Total Score 8 0 0 0 0   Flowsheet Row ED to Hosp-Admission  (Discharged) from 01/17/2024 in [facility] Video Visit from 11/05/2021 in [facility] Pre-Admission Testing 60 from 09/29/2021 in [place] [facility]ICAL [facility]  C-SSRS RISK CATEGORY No Risk No Risk No Risk     Assessment and Plan:   Valerie Reilly is a 48 year old female with a past psychiatric history significant for generalized anxiety disorder, bipolar 1 disorder, and 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  Patient was last seen by Dr. Mercy on  01/26/2024.  During her last encounter, patient was being managed on the following psychiatric medications:  Trazodone  75 to 150 mg at bedtime as needed Lithium  300 mg 2 times daily Latuda  40 mg in the evening with supper Buspirone  30 mg 2 times daily Gabapentin  100 mg 2 times daily Hydroxyzine  12.5 to 25 mg 2 times daily as needed  Patient presents to the encounter stating that she has been taking her medications regularly and denies experiencing any adverse side effects.  An aims assessment was performed with the patient scoring a 0.  Patient denies overt depressive symptoms but states that she continues to experience anxiety attributed to her daughter living with her at this time. A PHQ-9 screen was performed with the patient scoring an 8.  A GAD-7 screen was also performed with the patient scoring a 13.  Patient endorses stability on her current medication regimen and would like to continue taking her medications as prescribed.  Patient's medications to be e-prescribed to pharmacy of choice.  Provider to obtain key appropriate labs due to patient's use of lithium  during her next assessment.  Provider to schedule patient for an EKG appointment during her next assessment.  Patient denies suicidal ideations and is able to contract for safety following the  conclusion of the encounter.  Collaboration of Care: Collaboration of Care: Medication Management AEB provider managing patient's psychiatric medications, Primary Care Provider AEB patient being seen by family medicine provider and internal medicine provider, Psychiatrist AEB patient being followed by a mental health provider at this facility, Other provider involved in patient's care AEB patient being seen by OB/GYN and neurology, and Referral or follow-up with counselor/therapist AEB patient being seen by licensed clinical social worker at this facility  Patient/Guardian was advised Release of Information must be obtained prior to any record release in order to collaborate their care with an outside provider. Patient/Guardian was advised if they have not already done so to contact the registration department to sign all necessary forms in order for us  to release information regarding their care.   Consent: Patient/Guardian gives verbal consent for treatment and assignment of benefits for services provided during this visit. Patient/Guardian expressed understanding and agreed to proceed.   1. GAD (generalized anxiety disorder)  - busPIRone  (BUSPAR ) 30 MG tablet; Take 1 tablet (30 mg total) by mouth 2 (two) times daily.  Dispense: 180 tablet; Refill: 1 - gabapentin  (NEURONTIN ) 100 MG capsule; Take 1 capsule (100 mg total) by mouth 2 (two) times daily.  Dispense: 180 capsule; Refill: 1 - hydrOXYzine  (ATARAX ) 25 MG tablet; Take 0.5-1 tablets (12.5-25 mg total) by mouth 2 (two) times daily as needed for anxiety.  Dispense: 90 tablet; Refill: 1  2. Bipolar I disorder (HCC)  - lithium  carbonate 300 MG capsule; Take 1 capsule (300 mg total) by mouth 2 (two) times daily with a meal.  Dispense: 180 capsule; Refill: 1 - traZODone  (DESYREL ) 150 MG tablet; Take 0.5-1 tablets (75-150 mg total) by mouth at bedtime.  Dispense: 90 tablet; Refill: 1 - lurasidone  (LATUDA ) 40 MG TABS tablet; Take 1 tablet (40 mg  total) by mouth daily with supper. Should be taken with food (at least 350 calories).  Dispense: 90 tablet; Refill: 1  3. Insomnia due to other mental disorder  - traZODone  (DESYREL ) 150 MG tablet; Take 0.5-1 tablets (75-150 mg total) by mouth at bedtime.  Dispense: 90 tablet; Refill: 1  Patient to follow-up in 2 months Provider spent a total of 24 minutes with the patient/reviewing patient's  chart  Reginia FORBES Bolster, PA 03/06/2024, 7:46 PM      [1] No Known Allergies

## 2024-03-07 ENCOUNTER — Other Ambulatory Visit: Payer: Self-pay

## 2024-03-09 ENCOUNTER — Ambulatory Visit (INDEPENDENT_AMBULATORY_CARE_PROVIDER_SITE_OTHER): Payer: Self-pay | Admitting: Mental Health

## 2024-03-09 DIAGNOSIS — F319 Bipolar disorder, unspecified: Secondary | ICD-10-CM | POA: Diagnosis not present

## 2024-03-09 DIAGNOSIS — F411 Generalized anxiety disorder: Secondary | ICD-10-CM | POA: Diagnosis not present

## 2024-03-09 NOTE — Progress Notes (Signed)
" ° °  THERAPIST PROGRESS NOTE Virtual Visit via Video Note  I connected with Valerie Reilly on 03/09/2024 at 10:00 AM EST by a video enabled telemedicine application and verified that I am speaking with the correct person using two identifiers.  Location: Patient: home address on file Provider: remote office   I discussed the limitations of evaluation and management by telemedicine and the availability of in person appointments. The patient expressed understanding and agreed to proceed.  I discussed the assessment and treatment plan with the patient. The patient was provided an opportunity to ask questions and all were answered. The patient agreed with the plan and demonstrated an understanding of the instructions.   The patient was advised to call back or seek an in-person evaluation if the symptoms worsen or if the condition fails to improve as anticipated.  I provided 43 minutes of non-face-to-face time during this encounter.   Valerie Reilly, [facility]   Session Time: 10:09 am (43 minutes)  Participation Level: Active  Behavioral Response: CasualAlertAnxious  Type of Therapy: Individual Therapy  Treatment Goals addressed: STG: Deal better. Valerie Reilly will increase management of stress AEB ability to process events in balanced way with engagement in at least x 1 coping skills for stress as needed with the next 90 days.   ProgressTowards Goals: Progressing  Interventions: Supportive Solution Focused  Summary:  Valerie Reilly is a 49 y.o. female who presents with dx of bipolar disorder, borderline personality disorder and generalized anxiety. Presents to session alert and oriented; mood and affect irritable. Shares for moods I've been ok. Shares for moods to be stable and denies excessive highs or lows but notes recent increase in stress secondary to relationship with daughter and difficulty communicating with her in regard to house hold bills. Shares attempts to communicate  with daughter however feels as if she is not being truthful with her. Explores options with therapist to support in identifying ability to manage finances independently and maintain stability in community. Shares has reapplied for disability and other community resource with DSS for support with soil scientist. Agrees to contact needed parties to explore options for managing bills. Denies safety concerns.   Update CCA and annual txt plan at next session.    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 Completed check in and assessed for current level of functioning, sxs management and stressors. Provided safe space for to share concerns and provided supportive feedback. Supported in processing thoughts on current financial stressors with daughter. Active empathic listening. Engaged in solution building and what solutions have been explored in the past. Encouraged following up with needed parties and exploring options as well as planning ahead for ability to manage finances without support of daughter. Reviewed session and provided follow up.  Plan: Return again in  x 3 weeks.  Diagnosis: Bipolar I disorder (HCC)  GAD (generalized anxiety disorder)  Collaboration of Care: Other None  Patient/Guardian was advised Release of Information must be obtained prior to any record release in order to collaborate their care with an outside provider. Patient/Guardian was advised if they have not already done so to contact the registration department to sign all necessary forms in order for us  to release information regarding their care.   Consent: Patient/Guardian gives verbal consent for treatment and assignment of benefits for services provided during this visit. Patient/Guardian expressed understanding and agreed to proceed.   Valerie Reilly, [facility] 03/09/2024  "

## 2024-03-10 ENCOUNTER — Other Ambulatory Visit: Payer: Self-pay

## 2024-03-10 MED ORDER — METHOTREXATE SODIUM 2.5 MG PO TABS
ORAL_TABLET | ORAL | 0 refills | Status: AC
  Filled 2024-03-10: qty 21, 28d supply, fill #0

## 2024-03-12 ENCOUNTER — Other Ambulatory Visit: Payer: Self-pay

## 2024-03-15 ENCOUNTER — Ambulatory Visit: Admitting: Family Medicine

## 2024-03-15 ENCOUNTER — Ambulatory Visit: Admitting: Obstetrics and Gynecology

## 2024-03-16 ENCOUNTER — Ambulatory Visit
Admission: RE | Admit: 2024-03-16 | Discharge: 2024-03-16 | Disposition: A | Source: Ambulatory Visit | Attending: Nurse Practitioner

## 2024-03-16 DIAGNOSIS — Z1231 Encounter for screening mammogram for malignant neoplasm of breast: Secondary | ICD-10-CM

## 2024-03-27 ENCOUNTER — Ambulatory Visit: Admitting: Neurology

## 2024-03-27 ENCOUNTER — Encounter: Payer: Self-pay | Admitting: Neurology

## 2024-03-27 ENCOUNTER — Encounter: Payer: Self-pay | Admitting: Orthopaedic Surgery

## 2024-03-27 ENCOUNTER — Ambulatory Visit: Admitting: Orthopaedic Surgery

## 2024-03-27 ENCOUNTER — Telehealth: Payer: Self-pay | Admitting: Neurology

## 2024-03-27 VITALS — BP 136/80 | HR 73 | Ht 66.0 in | Wt 228.0 lb

## 2024-03-27 DIAGNOSIS — R519 Headache, unspecified: Secondary | ICD-10-CM

## 2024-03-27 DIAGNOSIS — M7541 Impingement syndrome of right shoulder: Secondary | ICD-10-CM

## 2024-03-27 DIAGNOSIS — R413 Other amnesia: Secondary | ICD-10-CM

## 2024-03-27 DIAGNOSIS — R351 Nocturia: Secondary | ICD-10-CM

## 2024-03-27 DIAGNOSIS — Z820 Family history of epilepsy and other diseases of the nervous system: Secondary | ICD-10-CM

## 2024-03-27 DIAGNOSIS — F17200 Nicotine dependence, unspecified, uncomplicated: Secondary | ICD-10-CM | POA: Diagnosis not present

## 2024-03-27 DIAGNOSIS — R6889 Other general symptoms and signs: Secondary | ICD-10-CM

## 2024-03-27 DIAGNOSIS — Z818 Family history of other mental and behavioral disorders: Secondary | ICD-10-CM

## 2024-03-27 DIAGNOSIS — Z9189 Other specified personal risk factors, not elsewhere classified: Secondary | ICD-10-CM

## 2024-03-27 MED ORDER — BUPIVACAINE HCL 0.5 % IJ SOLN
3.0000 mL | INTRAMUSCULAR | Status: AC | PRN
  Administered 2024-03-27: 3 mL via INTRA_ARTICULAR

## 2024-03-27 MED ORDER — METHYLPREDNISOLONE ACETATE 40 MG/ML IJ SUSP
40.0000 mg | INTRAMUSCULAR | Status: AC | PRN
  Administered 2024-03-27: 40 mg via INTRA_ARTICULAR

## 2024-03-27 MED ORDER — LIDOCAINE HCL 1 % IJ SOLN
3.0000 mL | INTRAMUSCULAR | Status: AC | PRN
  Administered 2024-03-27: 3 mL

## 2024-03-27 NOTE — Patient Instructions (Signed)
 It was nice to meet you today.  You have complaints of memory loss: memory loss or changes in cognitive function can have many reasons and does not always mean you have dementia.  There are several conditions and situations that can contribute to subjective or objective memory loss.  These factors include: depression, stress, sleep deprivation or poor sleep from insomnia or sleep apnea, dehydration, fluctuation in blood sugar values, thyroid  or electrolyte dysfunction, medication effects from sedating medications or narcotic pain medication for example and certain vitamin deficiencies such as vitamin B12 deficiency, and anemia. Dementia can be caused by stroke, brain atherosclerosis or brain vascular disease due to vascular risk factors (smoking, high blood pressure, high cholesterol, obesity and uncontrolled diabetes), certain degenerative brain disorders (including Parkinson's disease and Multiple sclerosis) and by Alzheimer's disease or other, more rare and sometimes hereditary causes.   Here is what I would recommend:   Blood work (which we will do today).  We will do a brain scan, called MRI and call you with the test results. We will have to schedule you for this on a separate date. This test requires authorization from your insurance, and we will take care of the insurance process. We will request a formal cognitive test called neuropsychological evaluation which is done by a licensed neuropsychologist. We will make a referral in that regard.  Please keep in mind that your medication regimen can also contribute to slowness in thinking and difficulty with processing speed and cognitive issues in general.  Please work on smoking cessation.  Talk to your primary care about help with quitting. I recommend we proceed with a sleep study to rule out obstructive sleep apnea.  If you have obstructive sleep apnea I will likely recommend treatment with a CPAP or AutoPap machine. We will keep you posted as to  your test results by phone call for now.  We will plan a follow-up after testing.

## 2024-03-27 NOTE — Progress Notes (Signed)
 "  Office Visit Note   Patient: Valerie Reilly           Date of Birth: 1975-09-17           MRN: [id number] Visit Date: 03/27/2024              Requested by: Theotis Haze ORN, NP [address] [place],  [place] [address] PCP: Theotis Haze ORN, NP   Assessment & Plan: Visit Diagnoses:  1. Impingement syndrome of right shoulder     Plan: Impression is right shoulder impingement syndrome.  Rotator cuff function intact.  Treatment options discussed and subacromial injection was administered.  Follow-up if symptoms persist after 6 weeks.  Follow-Up Instructions: Return if symptoms worsen or fail to improve.   Orders:  No orders of the defined types were placed in this encounter.  No orders of the defined types were placed in this encounter.     Procedures: Large Joint Inj: R subacromial bursa on 03/27/2024 1:38 PM Indications: pain Details: 22 G needle  Arthrogram: No  Medications: 3 mL lidocaine  1 %; 3 mL bupivacaine  0.5 %; 40 mg methylPREDNISolone  acetate 40 MG/ML Outcome: tolerated well, no immediate complications Consent was given by the patient. Patient was prepped and draped in the usual sterile fashion.       Clinical Data: No additional findings.   Subjective: Chief Complaint  Patient presents with   Right Upper Arm - Pain    HPI Patient is a 49 year old female with a right shoulder and arm pain for 2 months.  Hurts to lift her arm up for ADLs.  Denies any neck or radicular symptoms.  Right-hand-dominant. Review of Systems  Constitutional: Negative.   HENT: Negative.    Eyes: Negative.   Respiratory: Negative.    Cardiovascular: Negative.   Endocrine: Negative.   Musculoskeletal: Negative.   Neurological: Negative.   Hematological: Negative.   Psychiatric/Behavioral: Negative.    All other systems reviewed and are negative.    Objective: Vital Signs: LMP 04/08/2022   Physical Exam Vitals and nursing note reviewed.   Constitutional:      Appearance: She is well-developed.  HENT:     Head: Atraumatic.     Nose: Nose normal.  Eyes:     Extraocular Movements: Extraocular movements intact.  Cardiovascular:     Pulses: Normal pulses.  Pulmonary:     Effort: Pulmonary effort is normal.  Abdominal:     Palpations: Abdomen is soft.  Musculoskeletal:     Cervical back: Neck supple.  Skin:    General: Skin is warm.     Capillary Refill: Capillary refill takes less than 2 seconds.  Neurological:     Mental Status: She is alert. Mental status is at baseline.  Psychiatric:        Behavior: Behavior normal.        Thought Content: Thought content normal.        Judgment: Judgment normal.     Ortho Exam Exam of the right shoulder shows normal passive and active range of motion.  Pain with Hawkins and Neer sign.  Good strength manual muscle testing of the rotator cuff. Specialty Comments:  No specialty comments available.  Imaging: No results found.   PMFS History: Patient Active Problem List   Diagnosis Date Noted   Pain of right upper extremity 03/05/2024   Lithium  toxicity 01/17/2024   Lithium  toxicity, accidental or unintentional, initial encounter 01/17/2024   Abscess of skin of breast 01/05/2024  Mild cognitive impairment 11/01/2023   Viral conjunctivitis of right eye 10/26/2023   Vaginitis 12/15/2022   Neuropathy 10/15/2022   High risk heterosexual behavior 10/15/2022   Lumbar back pain with radiculopathy affecting right lower extremity 10/15/2022   Calculus of gallbladder without cholecystitis without obstruction    Bipolar I disorder (HCC) 09/26/2019   GAD (generalized anxiety disorder) 09/26/2019   Insomnia due to other mental disorder 09/26/2019   Long term use of drug 01/04/2019   Postoperative visit 01/04/2019   Dyslipidemia 07/29/2018   Uncontrolled type 2 diabetes mellitus with hyperglycemia (HCC) 08/18/2016   Cannabis abuse, in remission 08/15/2014   Acquired  hypothyroidism 06/25/2014   Essential hypertension 06/25/2014   Borderline personality disorder (HCC) 04/30/2014   Allergic rhinitis 03/30/2012   GERD (gastroesophageal reflux disease) 03/30/2012   Psoriasis 03/30/2012   Vitamin D  deficiency 03/30/2012   Past Medical History:  Diagnosis Date   Allergy    Anxiety    Asthma    Bipolar disorder (HCC)    Diabetes mellitus    Diabetes mellitus, type II (HCC)    Gallstone    GERD (gastroesophageal reflux disease)    History of borderline personality disorder    Hypercholesteremia    Hypertension    Obesity    Psoriasis    Psoriasis    Seasonal allergies    Thyroid  disease     Family History  Problem Relation Age of Onset   Personality disorder Mother    Diabetes Mother    Diabetes Father    Dementia Father    Alcohol  abuse Maternal Uncle    Depression Maternal Uncle    Diabetes Maternal Grandfather    Breast cancer Neg Hx    Stomach cancer Neg Hx    Colon cancer Neg Hx    Esophageal cancer Neg Hx    Colon polyps Neg Hx    Crohn's disease Neg Hx    Ulcerative colitis Neg Hx    Rectal cancer Neg Hx     Past Surgical History:  Procedure Laterality Date   CHOLECYSTECTOMY N/A 10/02/2021   Procedure: LAPAROSCOPIC CHOLECYSTECTOMY;  Surgeon: Evonnie Dorothyann LABOR, DO;  Location: [facility];  Service: General;  Laterality: N/A;   TONSILLECTOMY     TUBAL LIGATION  2 years ago   Social History   Occupational History   Occupation: delivery [employer]  Tobacco Use   Smoking status: Every Day    Current packs/day: 2.00    Average packs/day: 2.0 packs/day for 30.0 years (60.0 ttl pk-yrs)    Types: Cigarettes    Passive exposure: Current   Smokeless tobacco: Never  Vaping Use   Vaping status: Never Used  Substance and Sexual Activity   Alcohol  use: Yes    Comment: occasional- a few times a year   Drug use: No    Types: Marijuana    Comment: last time was 3 yrs ago   Sexual activity: Yes    Birth control/protection:  Surgical    Comment: Tubal ligation        "

## 2024-03-27 NOTE — Progress Notes (Addendum)
 Subjective:    Patient ID: Valerie Reilly is a 49 y.o. female.  HPI    True Mar, MD, PhD [facility] [address], [address] [address] [place], [place] [address]  Dear Dr. Mercy,  I saw your patient, Valerie Reilly, upon your kind request in my neurologic clinic today for evaluation of her cognitive complaints.  The patient is unaccompanied today.  As you know, Valerie Reilly is a 49 year old female with an underlying medical history of mood disorder including bipolar disorder, borderline personality disorder, generalized anxiety disorder, hypertension, hyperlipidemia, diabetes, allergies, asthma, reflux disease, psoriasis, and obesity, who reports difficulty with her short-term memory.  Symptoms have been ongoing for at least 6 months to maybe 2 years even.  Valerie Reilly is not completely sure.  Of note, Valerie Reilly has been on Latuda  for the past few months.  Her other medications have been more longer-term.  Valerie Reilly is forgetful and loses her train of thought.  Valerie Reilly had a hospitalization for lithium  toxicity in November 2025 and does not have any recollection as to what led to the hospitalization.  Her daughter took her.  Valerie Reilly denies taking extra lithium  but her dose was different at the time.  Valerie Reilly reports that her dad had dementia, he also had sleep apnea.  He passed away at 19 or thereabouts as far Valerie Reilly can recall.  Valerie Reilly tries to hydrate well with water, Valerie Reilly estimates that Valerie Reilly drinks about 3-4 bottles of water per day, 16.9 ounce size each.  Valerie Reilly drinks alcohol  rarely, once every 6 months or so.  Valerie Reilly smokes about 2 packs/day and is currently not working on smoking cessation, Valerie Reilly reports that Valerie Reilly tried in the past unsuccessfully.  Valerie Reilly has not fallen, denies any severe headaches but has woken up with dull headaches.  Valerie Reilly denies any sudden onset of one-sided weakness or numbness or tingling or droopy face or slurring of speech.  I reviewed your office note from 11/01/2023.  Her MoCA was 19/30  at the time.  Valerie Reilly was advised to taper certain medications that could contribute to memory impairment including trazodone  and gabapentin .  Of note, Valerie Reilly also takes BuSpar , lithium , hydroxyzine , Latuda .  Full list of medications below.  Valerie Reilly takes 250 mg of trazodone  each night on most nights, sometimes only half of it.  Valerie Reilly rarely takes hydroxyzine .  Valerie Reilly takes gabapentin  100 mg twice daily.  A head CT without contrast was ordered in November but Valerie Reilly has not had it done. Recent laboratory test results were reviewed in her electronic chart.  In November 2025 Valerie Reilly had her TSH and B12 checked.  B12 was normal at 539, TSH was elevated at 5.92. Valerie Reilly has had fluctuating lithium  levels.  In November her level was elevated.  In December her lithium  level was in the therapeutic window at 0.9.  Valerie Reilly had a sleep study several years ago.  Valerie Reilly is not sure if Valerie Reilly snores.  Valerie Reilly has severe nocturia, about 4 times per night on average.  Valerie Reilly has woken up with a dull headache.  Valerie Reilly would be willing to pursue another sleep study.  Prior results are not available for my review today.  Epworth sleepiness score is 9 out of 24 today.  Valerie Reilly is up-to-date with her eye examination.  Valerie Reilly went to [facility] last year.  Her Past Medical History Is Significant For: Past Medical History:  Diagnosis Date   Allergy    Anxiety    Asthma    Bipolar disorder (HCC)  Diabetes mellitus    Diabetes mellitus, type II (HCC)    Gallstone    GERD (gastroesophageal reflux disease)    History of borderline personality disorder    Hypercholesteremia    Hypertension    Obesity    Psoriasis    Psoriasis    Seasonal allergies    Thyroid  disease     Her Past Surgical History Is Significant For: Past Surgical History:  Procedure Laterality Date   CHOLECYSTECTOMY N/A 10/02/2021   Procedure: LAPAROSCOPIC CHOLECYSTECTOMY;  Surgeon: Evonnie Dorothyann LABOR, DO;  Location: [facility];  Service: General;  Laterality: N/A;   TONSILLECTOMY      TUBAL LIGATION  2 years ago    Her Family History Is Significant For: Family History  Problem Relation Age of Onset   Personality disorder Mother    Diabetes Mother    Diabetes Father    Dementia Father    Alcohol  abuse Maternal Uncle    Depression Maternal Uncle    Diabetes Maternal Grandfather    Breast cancer Neg Hx    Stomach cancer Neg Hx    Colon cancer Neg Hx    Esophageal cancer Neg Hx    Colon polyps Neg Hx    Crohn's disease Neg Hx    Ulcerative colitis Neg Hx    Rectal cancer Neg Hx     Her Social History Is Significant For: Social History   Socioeconomic History   Marital status: Divorced    Spouse name: Not on file   Number of children: 1   Years of education: Not on file   Highest education level: Associate degree: occupational, scientist, product/process development, or vocational program  Occupational History   Occupation: delivery newspaper  Tobacco Use   Smoking status: Every Day    Current packs/day: 2.00    Average packs/day: 2.0 packs/day for 30.0 years (60.0 ttl pk-yrs)    Types: Cigarettes    Passive exposure: Current   Smokeless tobacco: Never  Vaping Use   Vaping status: Never Used  Substance and Sexual Activity   Alcohol  use: Yes    Comment: occasional- a few times a year   Drug use: No    Types: Marijuana    Comment: last time was 3 yrs ago   Sexual activity: Yes    Birth control/protection: Surgical    Comment: Tubal ligation  Other Topics Concern   Not on file  Social History Narrative   Pt lives with family    Disability    Social Drivers of Health   Tobacco Use: High Risk (03/27/2024)   Patient History    Smoking Tobacco Use: Every Day    Smokeless Tobacco Use: Never    Passive Exposure: Current  Financial Resource Strain: High Risk (12/01/2023)   Overall Financial Resource Strain (CARDIA)    Difficulty of Paying Living Expenses: Very hard  Food Insecurity: No Food Insecurity (01/17/2024)   Epic    Worried About Programme Researcher, Broadcasting/film/video in the Last  Year: Never true    Ran Out of Food in the Last Year: Never true  Recent Concern: Food Insecurity - Food Insecurity Present (12/16/2023)   Epic    Worried About Programme Researcher, Broadcasting/film/video in the Last Year: Sometimes true    Ran Out of Food in the Last Year: Sometimes true  Transportation Needs: No Transportation Needs (01/17/2024)   Epic    Lack of Transportation (Medical): No    Lack of Transportation (Non-Medical): No  Physical Activity: Inactive (12/15/2022)  Exercise Vital Sign    Days of Exercise per Week: 0 days    Minutes of Exercise per Session: 0 min  Stress: No Stress Concern Present (12/15/2022)   [employer] of Occupational Health - Occupational Stress Questionnaire    Feeling of Stress : Not at all  Social Connections: Socially Isolated (12/15/2022)   Social Connection and Isolation Panel    Frequency of Communication with Friends and Family: More than three times a week    Frequency of Social Gatherings with Friends and Family: Once a week    Attends Religious Services: Never    Database Administrator or Organizations: No    Attends Banker Meetings: Never    Marital Status: Divorced  Depression (PHQ2-9): Medium Risk (03/06/2024)   Depression (PHQ2-9)    PHQ-2 Score: 8  Alcohol  Screen: Low Risk (06/06/2022)   Alcohol  Screen    Last Alcohol  Screening Score (AUDIT): 5  Housing: Low Risk (01/17/2024)   Epic    Unable to Pay for Housing in the Last Year: No    Number of Times Moved in the Last Year: 0    Homeless in the Last Year: No  Utilities: Not At Risk (01/17/2024)   Epic    Threatened with loss of utilities: No  Recent Concern: Utilities - At Risk (12/01/2023)   Epic    Threatened with loss of utilities: Yes  Health Literacy: Adequate Health Literacy (12/15/2022)   B1300 Health Literacy    Frequency of need for help with medical instructions: Never    Her Allergies Are:  Allergies[1]:   Her Current Medications Are:  Outpatient Encounter  Medications as of 03/27/2024  Medication Sig   Accu-Chek Softclix Lancets lancets Use as instructed. Testing twice daily   albuterol  (VENTOLIN  HFA) 108 (90 Base) MCG/ACT inhaler Inhale 2 puffs into the lungs every 6 (six) hours as needed for wheezing or shortness of breath.   benazepril  (LOTENSIN ) 20 MG tablet Take 1 tablet (20 mg total) by mouth daily.   busPIRone  (BUSPAR ) 30 MG tablet Take 1 tablet (30 mg total) by mouth 2 (two) times daily.   cetirizine (ZYRTEC) 10 MG tablet Take 10 mg by mouth in the morning.   cholecalciferol (VITAMIN D ) 1000 UNITS tablet Take 1,000 Units by mouth daily.   clobetasol  (TEMOVATE ) 0.05 % external solution Apply 1 (one) mL two times daily to SCALP. after 14 days reduce use to 2 days per week.   clobetasol  cream (TEMOVATE ) 0.05 % Apply 1 (one) Application two times a day, 2 days per week   estradiol  (ESTRACE  VAGINAL) 0.1 MG/GM vaginal cream Place 1 gram vaginally at bedtime. Every night for 2 weeks and then twice a week aftewards   famotidine  (PEPCID ) 20 MG tablet Take 1 tablet (20 mg total) by mouth 2 (two) times daily.   fluocinonide  (LIDEX ) 0.05 % external solution Apply 1 Application topically 2 (two) times daily as needed   fluticasone  (FLONASE ) 50 MCG/ACT nasal spray Place 2 sprays into both nostrils daily.   gabapentin  (NEURONTIN ) 100 MG capsule Take 1 capsule (100 mg total) by mouth 2 (two) times daily.   glipiZIDE  (GLUCOTROL  XL) 10 MG 24 hr tablet Take 1 tablet (10 mg total) by mouth daily with breakfast.   glucose blood (TRUE METRIX BLOOD GLUCOSE TEST) test strip Use as instructed   hydroxypropyl methylcellulose / hypromellose (ISOPTO TEARS / GONIOVISC) 2.5 % ophthalmic solution Place 1 drop into both eyes in the morning and at bedtime.  hydrOXYzine  (ATARAX ) 25 MG tablet Take 0.5-1 tablets (12.5-25 mg total) by mouth 2 (two) times daily as needed for anxiety.   levothyroxine  (SYNTHROID ) 50 MCG tablet Take 50 mcg by mouth daily before breakfast.    levothyroxine  (SYNTHROID ) 50 MCG tablet Take 1 tablet (50 mcg total) by mouth daily before breakfast.   lithium  carbonate 300 MG capsule Take 1 capsule (300 mg total) by mouth 2 (two) times daily with a meal.   lurasidone  (LATUDA ) 40 MG TABS tablet Take 1 tablet (40 mg total) by mouth daily with supper. Should be taken with food (at least 350 calories).   metFORMIN  (GLUCOPHAGE ) 1000 MG tablet Take 1,000 mg by mouth 2 (two) times daily with a meal.   methotrexate  (RHEUMATREX) 2.5 MG tablet Take 1-2 tablets Q 12 hours x 3 doses per week (4 pills for the 1st week, to take 2 pills for one of these doses; 5 pills per week for the 2nd week, to take 2 pills for two of these doses; 6 pills per week for the 3rd and 4th weeks; take 2 pills for each dose.)   Multiple Vitamin (MULTIVITAMIN) capsule Take 1 capsule by mouth daily with breakfast.   simvastatin  (ZOCOR ) 40 MG tablet Take 1 tablet (40 mg total) by mouth daily.   traZODone  (DESYREL ) 150 MG tablet Take 0.5-1 tablets (75-150 mg total) by mouth at bedtime.   azelastine  (ASTELIN ) 0.1 % nasal spray Place 2 sprays into both nostrils 2 (two) times daily. Use in each nostril as directed (Patient not taking: Reported on 03/05/2024)   doxycycline  (VIBRA -TABS) 100 MG tablet Take 1 tablet (100 mg total) by mouth daily. (Patient not taking: Reported on 03/05/2024)   lidocaine  (LIDODERM ) 5 % Place 1 patch onto the skin daily. Remove & Discard patch within 12 hours daily, to lower back (Patient not taking: Reported on 03/05/2024)   neomycin -polymyxin b -dexamethasone  (MAXITROL ) 3.5-10000-0.1 OINT Apply 1/4 inch into left eye every night at bedtime (Patient not taking: Reported on 03/05/2024)   Nicotine  (NICODERM CQ  TD) Place 1 patch onto the skin daily as needed (for smoking cessation while hospitalized). (Patient not taking: Reported on 03/05/2024)   nystatin  (MYCOSTATIN ) 100000 UNIT/ML suspension Take 5 mLs (500,000 Units total) by mouth 4 (four) times daily. (Patient  not taking: Reported on 03/05/2024)   No facility-administered encounter medications on file as of 03/27/2024.  :   Review of Systems:  Out of a complete 14 point review of systems, all are reviewed and negative with the exception of these symptoms as listed below:   Review of Systems  Objective:  Neurological Exam  Physical Exam Physical Examination:   Vitals:   03/27/24 0757  BP: 136/80  Pulse: 73    General Examination: The patient is a very pleasant 49 y.o. female in no acute distress. Valerie Reilly appears deconditioned and adequately groomed.   HEENT: Normocephalic, atraumatic, pupils are equal, round and reactive to light, extraocular tracking is good without limitation to gaze excursion or nystagmus noted. No photophobia.  No Corrective eye glasses in place. Hearing is grossly intact.  Face is symmetric with mild facial masking.  Speech is raspy, not dysarthric.  Neck with full range of motion, no carotid bruits.  Airway examination reveals moderate mouth dryness, dentures in place, airway crowding noted, Mallampati class IV.  I could not visualize the tip of her uvula, tonsils are absent per patient.  Small mouth opening noted as well.   Chest: Clear to auscultation without wheezing, rhonchi or crackles noted.  Heart: S1+S2+0, regular and normal without murmurs, rubs or gallops noted.   Abdomen: Soft, non-tender and non-distended.  Extremities: There is no pitting edema in the distal lower extremities bilaterally.   Skin: Dystrophic nails from psoriatic changes and psoriasis patches particularly around elbows.    Musculoskeletal: exam reveals no obvious joint deformities.   Neurologically:  Mental status: The patient is awake, pays attention, does not give very many details about her history in general.   Affect is blunted.     03/27/2024    8:08 AM  Montreal Cognitive Assessment   Visuospatial/ Executive (0/5) 3  Naming (0/3) 3  Attention: Read list of digits (0/2) 2   Attention: Read list of letters (0/1) 1  Attention: Serial 7 subtraction starting at 100 (0/3) 3  Language: Repeat phrase (0/2) 2  Language : Fluency (0/1) 0  Abstraction (0/2) 2  Delayed Recall (0/5) 2  Orientation (0/6) 6  Total 24   On 03/27/2024: CDT: 1/4, AFT: 4/min.  Cranial nerves II - XII are as described above under HEENT exam.  Motor exam: Normal bulk, strength and tone is noted. There is no obvious action or resting tremor.  Fine motor skills and coordination: Intact grossly with mild slowness noted with finger taps, hand movements and rapid alternating patting as well as foot taps.  Cerebellar testing: No dysmetria or intention tremor. There is no truncal or gait ataxia.  Normal finger-to-nose, normal heel-to-shin bilaterally. Reflexes trace to 1+ throughout, toes are downgoing bilaterally. Sensory exam: intact to light touch in the upper and lower extremities.  Gait, station and balance: Valerie Reilly stands easily. No veering to one side is noted. No leaning to one side is noted. Posture is age-appropriate.  Valerie Reilly walks slowly.    Assessment and Plan:  In summary, Bradyn S Reilly is a 49 year old female with an underlying medical history of mood disorder including bipolar disorder, borderline personality disorder, generalized anxiety disorder, hypertension, hyperlipidemia, diabetes, allergies, asthma, reflux disease, psoriasis, and obesity, who presents for evaluation of her short-term memory issues of several months duration.  Differential diagnoses includes memory impairment secondary to recent lithium  toxicity, medication side effects from taking multiple psychotropic medications, sleep disordered breathing not excluded, early onset dementia certainly a possibility, Valerie Reilly does have vascular risk factors, the biggest 1 would be her smoking.  Valerie Reilly also reports a family history of dementia and sleep apnea.  I had a long discussion with the patient today, we addressed several issues, this was a  lengthy appointment of over 60 minutes with copious record review involved, memory testing and interpretation, and considerable counseling and coordination of care.  Memory testing shows a mildly abnormal MoCA score.  Valerie Reilly has difficulty with processing speed and visuospatial issues.  Below is a summary of my recommendations and our discussion points from today's visit, based on chart review, history and examination. They were given these instructions verbally during the visit in detail and also in writing in the MyChart after visit summary (AVS), which they can access electronically. <<   Blood work (which we will do today).  We will do a brain scan, called MRI and call you with the test results. We will have to schedule you for this on a separate date. This test requires authorization from your insurance, and we will take care of the insurance process. We will request a formal cognitive test called neuropsychological evaluation which is done by a licensed neuropsychologist. We will make a referral in that regard.  Please keep in  mind that your medication regimen can also contribute to slowness in thinking and difficulty with processing speed and cognitive issues in general.  Please work on smoking cessation.  Talk to your primary care about help with quitting. I recommend we proceed with a sleep study to rule out obstructive sleep apnea.  If you have obstructive sleep apnea I will likely recommend treatment with a CPAP or AutoPap machine. We will keep you posted as to your test results by phone call for now.  We will plan a follow-up after testing.    >>    Thank you very much for allowing me to participate in the care of this nice patient. If I can be of any further assistance to you please do not hesitate to call me at [phone].  Sincerely,   True Mar, MD, PhD     [1] No Known Allergies

## 2024-03-27 NOTE — Telephone Encounter (Signed)
MRI order sent to [place] [phone]

## 2024-03-29 ENCOUNTER — Ambulatory Visit: Payer: Self-pay | Admitting: Neurology

## 2024-03-29 LAB — B12 AND FOLATE PANEL: Vitamin B-12: 480 pg/mL (ref 232–1245)

## 2024-03-29 LAB — HGB A1C W/O EAG: Hgb A1c MFr Bld: 6.2 % — ABNORMAL HIGH (ref 4.8–5.6)

## 2024-03-29 LAB — TSH: TSH: 2.45 u[IU]/mL (ref 0.450–4.500)

## 2024-04-03 ENCOUNTER — Other Ambulatory Visit: Payer: Self-pay

## 2024-04-03 ENCOUNTER — Other Ambulatory Visit: Payer: Self-pay | Admitting: Nurse Practitioner

## 2024-04-03 ENCOUNTER — Other Ambulatory Visit (HOSPITAL_COMMUNITY): Payer: Self-pay

## 2024-04-03 DIAGNOSIS — E119 Type 2 diabetes mellitus without complications: Secondary | ICD-10-CM

## 2024-04-03 MED ORDER — METFORMIN HCL 1000 MG PO TABS
1000.0000 mg | ORAL_TABLET | Freq: Two times a day (BID) | ORAL | 1 refills | Status: AC
  Filled 2024-04-03: qty 180, 90d supply, fill #0

## 2024-04-04 ENCOUNTER — Other Ambulatory Visit: Payer: Self-pay

## 2024-04-05 ENCOUNTER — Other Ambulatory Visit (HOSPITAL_COMMUNITY): Payer: Self-pay

## 2024-04-05 ENCOUNTER — Telehealth: Payer: Self-pay | Admitting: Neurology

## 2024-04-05 ENCOUNTER — Other Ambulatory Visit: Payer: Self-pay

## 2024-04-05 NOTE — Telephone Encounter (Signed)
 [facility] MCD [facility] pending.

## 2024-04-06 ENCOUNTER — Ambulatory Visit (INDEPENDENT_AMBULATORY_CARE_PROVIDER_SITE_OTHER): Payer: Self-pay | Admitting: Mental Health

## 2024-04-06 DIAGNOSIS — F411 Generalized anxiety disorder: Secondary | ICD-10-CM

## 2024-04-06 DIAGNOSIS — F319 Bipolar disorder, unspecified: Secondary | ICD-10-CM

## 2024-04-06 NOTE — Progress Notes (Unsigned)
" ° °  THERAPIST PROGRESS NOTE Virtual Visit via Video Note  I connected with Valerie Reilly on 04/06/2024 at 10:00 AM EST by a video enabled telemedicine application and verified that I am speaking with the correct person using two identifiers.  Location: Patient: home address on file Provider: remote office   I discussed the limitations of evaluation and management by telemedicine and the availability of in person appointments. The patient expressed understanding and agreed to proceed.  I discussed the assessment and treatment plan with the patient. The patient was provided an opportunity to ask questions and all were answered. The patient agreed with the plan and demonstrated an understanding of the instructions.   The patient was advised to call back or seek an in-person evaluation if the symptoms worsen or if the condition fails to improve as anticipated.  I provided 42 minutes of non-face-to-face time during this encounter.   Ty Bernice Savant, [facility]   Session Time: 10:03 am ( 42 minutes)  Participation Level: Active  Behavioral Response: CasualAlertirritable  Type of Therapy: Individual Therapy  Treatment Goals addressed:  STG: Deal better. Naveh will increase management of stress AEB ability to process events in balanced way with engagement in at least x 1 coping skills for stress as needed with the next 90 days.   ProgressTowards Goals: Progressing  Interventions: Strength-based and Supportive  Summary:  Valerie Reilly is a 49 y.o. female who presents with dx of bipolar disorder, borderline personality disorder and generalized anxiety. Presents to session alert and oriented; mood and affect irritable. Shares for moods I've been ok. Shares for moods to be stable however notes stressor of finances with difficulty communicating concerning bills with daughter. Shares with therapist current state of finances and challenges with ongoing communication with daughter.  Shares working to focus on what she can control vs focus on daughter. Shares for partner to be a good support for her and working to reduce feelings of stress as well as pets for self soothe. Shares concerns for health and explores working to advocate for self with providers. Denies safety concerns. Ongoing work towards goals  Suicidal/Homicidal: Nature Conservation Officer Response:  Paramedic engaged [place] in [place] [place]. Completed check in and assessed for current level of functioning, sxs management and stressors. Provided safe space for to share concerns and provided supportive feedback. Supported in processing thoughts on current financial stressors with daughter. Active empathic listening. Engaged in identifying supports and identifying options for engaging with daughter. Encouraged asked questions of medical providers and advocating for self. Reviewed session and provided follow up.   Plan: Return again in x 4 weeks.  Diagnosis: Bipolar I disorder (HCC)  GAD (generalized anxiety disorder)  Collaboration of Care: Other None  Patient/Guardian was advised Release of Information must be obtained prior to any record release in order to collaborate their care with an outside provider. Patient/Guardian was advised if they have not already done so to contact the registration department to sign all necessary forms in order for us  to release information regarding their care.   Consent: Patient/Guardian gives verbal consent for treatment and assignment of benefits for services provided during this visit. Patient/Guardian expressed understanding and agreed to proceed.   Ty Bernice Sherburn, [facility] 04/06/2024  "

## 2024-04-09 ENCOUNTER — Other Ambulatory Visit

## 2024-04-11 NOTE — Telephone Encounter (Signed)
 NPSG MCD [facility] shara: [id number] (exp. 04/05/24 to 06/30/24)

## 2024-04-12 ENCOUNTER — Other Ambulatory Visit: Payer: Self-pay

## 2024-04-12 ENCOUNTER — Other Ambulatory Visit (HOSPITAL_COMMUNITY): Payer: Self-pay

## 2024-04-13 ENCOUNTER — Other Ambulatory Visit: Payer: Self-pay

## 2024-04-17 ENCOUNTER — Ambulatory Visit: Admitting: Nurse Practitioner

## 2024-04-17 ENCOUNTER — Other Ambulatory Visit

## 2024-04-26 ENCOUNTER — Telehealth: Admitting: *Deleted

## 2024-04-27 ENCOUNTER — Ambulatory Visit (HOSPITAL_COMMUNITY): Payer: Self-pay | Admitting: Mental Health

## 2024-05-01 ENCOUNTER — Telehealth (HOSPITAL_COMMUNITY): Payer: Self-pay | Admitting: Physician Assistant

## 2024-06-26 ENCOUNTER — Encounter: Admitting: Family Medicine

## 2024-06-26 ENCOUNTER — Ambulatory Visit: Admitting: Family Medicine
